# Patient Record
Sex: Female | Born: 1935
Health system: Southern US, Community
[De-identification: ages and names within clinical notes are randomized; demographics above are authoritative.]

## PROBLEM LIST (undated history)

## (undated) DIAGNOSIS — H04123 Dry eye syndrome of bilateral lacrimal glands: Secondary | ICD-10-CM

## (undated) DIAGNOSIS — Z9289 Personal history of other medical treatment: Secondary | ICD-10-CM

## (undated) DIAGNOSIS — K5900 Constipation, unspecified: Secondary | ICD-10-CM

## (undated) DIAGNOSIS — I10 Essential (primary) hypertension: Secondary | ICD-10-CM

## (undated) DIAGNOSIS — E785 Hyperlipidemia, unspecified: Secondary | ICD-10-CM

## (undated) DIAGNOSIS — M6289 Other specified disorders of muscle: Secondary | ICD-10-CM

## (undated) DIAGNOSIS — E039 Hypothyroidism, unspecified: Secondary | ICD-10-CM

## (undated) DIAGNOSIS — K1120 Sialoadenitis, unspecified: Secondary | ICD-10-CM

## (undated) DIAGNOSIS — R5383 Other fatigue: Secondary | ICD-10-CM

## (undated) DIAGNOSIS — K449 Diaphragmatic hernia without obstruction or gangrene: Secondary | ICD-10-CM

## (undated) DIAGNOSIS — E559 Vitamin D deficiency, unspecified: Secondary | ICD-10-CM

## (undated) DIAGNOSIS — M549 Dorsalgia, unspecified: Secondary | ICD-10-CM

## (undated) DIAGNOSIS — C50919 Malignant neoplasm of unspecified site of unspecified female breast: Secondary | ICD-10-CM

## (undated) DIAGNOSIS — H409 Unspecified glaucoma: Secondary | ICD-10-CM

## (undated) DIAGNOSIS — R0981 Nasal congestion: Secondary | ICD-10-CM

## (undated) DIAGNOSIS — E669 Obesity, unspecified: Secondary | ICD-10-CM

## (undated) DIAGNOSIS — K219 Gastro-esophageal reflux disease without esophagitis: Secondary | ICD-10-CM

## (undated) DIAGNOSIS — E119 Type 2 diabetes mellitus without complications: Secondary | ICD-10-CM

## (undated) DIAGNOSIS — M1990 Unspecified osteoarthritis, unspecified site: Secondary | ICD-10-CM

## (undated) DIAGNOSIS — M2550 Pain in unspecified joint: Secondary | ICD-10-CM

## (undated) DIAGNOSIS — K5792 Diverticulitis of intestine, part unspecified, without perforation or abscess without bleeding: Secondary | ICD-10-CM

## (undated) DIAGNOSIS — R7303 Prediabetes: Secondary | ICD-10-CM

## (undated) DIAGNOSIS — H43399 Other vitreous opacities, unspecified eye: Secondary | ICD-10-CM

## (undated) DIAGNOSIS — H9190 Unspecified hearing loss, unspecified ear: Secondary | ICD-10-CM

## (undated) DIAGNOSIS — G43109 Migraine with aura, not intractable, without status migrainosus: Secondary | ICD-10-CM

## (undated) DIAGNOSIS — M791 Myalgia, unspecified site: Secondary | ICD-10-CM

## (undated) DIAGNOSIS — J310 Chronic rhinitis: Secondary | ICD-10-CM

## (undated) DIAGNOSIS — K829 Disease of gallbladder, unspecified: Secondary | ICD-10-CM

## (undated) HISTORY — PX: MASTOIDECTOMY: SHX711

## (undated) HISTORY — DX: Pain in unspecified joint: M25.50

## (undated) HISTORY — DX: Chronic rhinitis: J31.0

## (undated) HISTORY — DX: Diverticulitis of intestine, part unspecified, without perforation or abscess without bleeding: K57.92

## (undated) HISTORY — DX: Unspecified hearing loss, unspecified ear: H91.90

## (undated) HISTORY — DX: Disease of gallbladder, unspecified: K82.9

## (undated) HISTORY — DX: Unspecified osteoarthritis, unspecified site: M19.90

## (undated) HISTORY — DX: Gastro-esophageal reflux disease without esophagitis: K21.9

## (undated) HISTORY — PX: APPENDECTOMY: SHX54

## (undated) HISTORY — DX: Dry eye syndrome of bilateral lacrimal glands: H04.123

## (undated) HISTORY — PX: SHOULDER ARTHROSCOPY: SHX128

## (undated) HISTORY — DX: Unspecified glaucoma: H40.9

## (undated) HISTORY — DX: Malignant neoplasm of unspecified site of unspecified female breast: C50.919

## (undated) HISTORY — DX: Vitamin D deficiency, unspecified: E55.9

## (undated) HISTORY — DX: Other specified disorders of muscle: M62.89

## (undated) HISTORY — PX: TUBAL LIGATION: SHX77

## (undated) HISTORY — DX: Other vitreous opacities, unspecified eye: H43.399

## (undated) HISTORY — DX: Sialoadenitis, unspecified: K11.20

## (undated) HISTORY — DX: Gastro-esophageal reflux disease without esophagitis: K44.9

## (undated) HISTORY — DX: Dorsalgia, unspecified: M54.9

## (undated) HISTORY — DX: Other fatigue: R53.83

## (undated) HISTORY — DX: Obesity, unspecified: E66.9

## (undated) HISTORY — DX: Myalgia, unspecified site: M79.10

## (undated) HISTORY — DX: Constipation, unspecified: K59.00

## (undated) HISTORY — DX: Prediabetes: R73.03

## (undated) HISTORY — PX: TONSILLECTOMY: SUR1361

## (undated) HISTORY — DX: Personal history of other medical treatment: Z92.89

## (undated) HISTORY — DX: Nasal congestion: R09.81

## (undated) HISTORY — PX: JOINT REPLACEMENT: SHX530

## (undated) HISTORY — DX: Hyperlipidemia, unspecified: E78.5

## (undated) HISTORY — DX: Type 2 diabetes mellitus without complications: E11.9

---

## 2005-09-14 HISTORY — PX: TOTAL KNEE ARTHROPLASTY: SHX125

## 2006-09-14 HISTORY — PX: OTHER SURGICAL HISTORY: SHX169

## 2008-09-14 HISTORY — PX: CHOLECYSTECTOMY: SHX55

## 2015-02-05 ENCOUNTER — Ambulatory Visit: Payer: Self-pay | Admitting: Family Medicine

## 2015-02-26 ENCOUNTER — Other Ambulatory Visit: Payer: Self-pay | Admitting: Orthopedic Surgery

## 2015-03-09 NOTE — Pre-Procedure Instructions (Signed)
Catherine Coleman  03/09/2015     Your procedure is scheduled on July 8.  Report to [facility] Admitting at 8:50 A.M.  Call this number if you have problems the morning of surgery:  [phone]   Remember:  Do not eat food or drink liquids after midnight.  Take these medicines the morning of surgery with A SIP OF WATER Eye drops, Levothyroxine, Omeprazole,    STOP Multiple Vitamin July 1   STOP/ Do not take Aspirin, Aleve, Naproxen, Advil, Ibuprofen, Motrin, Vitamins, Herbs, or Supplements starting July 1   Do not wear jewelry, make-up or nail polish.  Do not wear lotions, powders, or perfumes.  You may wear deodorant.  Do not shave 48 hours prior to surgery.  Men may shave face and neck.  Do not bring valuables to the hospital.  [facility] is not responsible for any belongings or valuables.  Contacts, dentures or bridgework may not be worn into surgery.  Leave your suitcase in the car.  After surgery it may be brought to your room.  For patients admitted to the hospital, discharge time will be determined by your treatment team.  [facility] - Preparing for Surgery  Before surgery, you can play an important role.  Because skin is not sterile, your skin needs to be as free of germs as possible.  You can reduce the number of germs on you skin by washing with CHG (chlorahexidine gluconate) soap before surgery.  CHG is an antiseptic cleaner which kills germs and bonds with the skin to continue killing germs even after washing.  Please DO NOT use if you have an allergy to CHG or antibacterial soaps.  If your skin becomes reddened/irritated stop using the CHG and inform your nurse when you arrive at Short Stay.  Do not shave (including legs and underarms) for at least 48 hours prior to the first CHG shower.  You may shave your face.  Please follow these instructions carefully:   1.  Shower with CHG Soap the night before surgery and the morning of Surgery.  2.  If you  choose to wash your hair, wash your hair first as usual with your normal shampoo.  3.  After you shampoo, rinse your hair and body thoroughly to remove the shampoo.  4.  Use CHG as you would any other liquid soap.  You can apply CHG directly to the skin and wash gently with scrungie or a clean washcloth.  5.  Apply the CHG Soap to your body ONLY FROM THE NECK DOWN.  Do not use on open wounds or open sores.  Avoid contact with your eyes, ears, mouth and genitals (private parts).  Wash genitals (private parts) with your normal soap.  6.  Wash thoroughly, paying special attention to the area where your surgery will be performed.  7.  Thoroughly rinse your body with warm water from the neck down.  8.  DO NOT shower/wash with your normal soap after using and rinsing off the CHG Soap.  9.  Pat yourself dry with a clean towel.            10.  Wear clean pajamas.            11.  Place clean sheets on your bed the night of your first shower and do not sleep with pets.  Day of Surgery  Do not apply any lotions the morning of surgery.  Please wear clean clothes to the hospital/surgery center.  Please read over the following fact sheets that you were given. Pain Booklet, Coughing and Deep Breathing, Blood Transfusion Information, Total Joint Packet and Surgical Site Infection Prevention

## 2015-03-11 ENCOUNTER — Encounter (HOSPITAL_COMMUNITY)
Admission: RE | Admit: 2015-03-11 | Discharge: 2015-03-11 | Disposition: A | Discharge status: Home / Self Care | Payer: Medicare Other | Source: Ambulatory Visit | Attending: Orthopedic Surgery | Admitting: Orthopedic Surgery

## 2015-03-11 ENCOUNTER — Encounter (HOSPITAL_COMMUNITY): Payer: Self-pay

## 2015-03-11 DIAGNOSIS — R001 Bradycardia, unspecified: Secondary | ICD-10-CM | POA: Diagnosis not present

## 2015-03-11 DIAGNOSIS — Z96612 Presence of left artificial shoulder joint: Secondary | ICD-10-CM | POA: Diagnosis not present

## 2015-03-11 DIAGNOSIS — Z79899 Other long term (current) drug therapy: Secondary | ICD-10-CM | POA: Diagnosis not present

## 2015-03-11 DIAGNOSIS — K219 Gastro-esophageal reflux disease without esophagitis: Secondary | ICD-10-CM | POA: Insufficient documentation

## 2015-03-11 DIAGNOSIS — Z01812 Encounter for preprocedural laboratory examination: Secondary | ICD-10-CM | POA: Insufficient documentation

## 2015-03-11 DIAGNOSIS — I10 Essential (primary) hypertension: Secondary | ICD-10-CM | POA: Diagnosis not present

## 2015-03-11 DIAGNOSIS — I517 Cardiomegaly: Secondary | ICD-10-CM | POA: Insufficient documentation

## 2015-03-11 DIAGNOSIS — Z96611 Presence of right artificial shoulder joint: Secondary | ICD-10-CM | POA: Insufficient documentation

## 2015-03-11 DIAGNOSIS — E039 Hypothyroidism, unspecified: Secondary | ICD-10-CM | POA: Diagnosis not present

## 2015-03-11 DIAGNOSIS — Z01818 Encounter for other preprocedural examination: Secondary | ICD-10-CM | POA: Diagnosis not present

## 2015-03-11 DIAGNOSIS — I7090 Unspecified atherosclerosis: Secondary | ICD-10-CM | POA: Diagnosis not present

## 2015-03-11 DIAGNOSIS — M479 Spondylosis, unspecified: Secondary | ICD-10-CM | POA: Insufficient documentation

## 2015-03-11 DIAGNOSIS — I4510 Unspecified right bundle-branch block: Secondary | ICD-10-CM | POA: Insufficient documentation

## 2015-03-11 DIAGNOSIS — Z0183 Encounter for blood typing: Secondary | ICD-10-CM | POA: Diagnosis not present

## 2015-03-11 DIAGNOSIS — M1611 Unilateral primary osteoarthritis, right hip: Secondary | ICD-10-CM | POA: Diagnosis not present

## 2015-03-11 HISTORY — DX: Hypothyroidism, unspecified: E03.9

## 2015-03-11 HISTORY — DX: Essential (primary) hypertension: I10

## 2015-03-11 HISTORY — DX: Gastro-esophageal reflux disease without esophagitis: K21.9

## 2015-03-11 HISTORY — DX: Unspecified osteoarthritis, unspecified site: M19.90

## 2015-03-11 LAB — COMPREHENSIVE METABOLIC PANEL
ALBUMIN: 3.9 g/dL (ref 3.5–5.0)
ALK PHOS: 82 U/L (ref 38–126)
ALT: 17 U/L (ref 14–54)
AST: 23 U/L (ref 15–41)
Anion gap: 8 (ref 5–15)
BUN: 11 mg/dL (ref 6–20)
CALCIUM: 8.7 mg/dL — AB (ref 8.9–10.3)
CO2: 27 mmol/L (ref 22–32)
Chloride: 98 mmol/L — ABNORMAL LOW (ref 101–111)
Creatinine, Ser: 0.69 mg/dL (ref 0.44–1.00)
GFR calc Af Amer: >60 mL/min (ref >60)
GFR calc non Af Amer: >60 mL/min (ref >60)
Glucose, Bld: 97 mg/dL (ref 65–99)
POTASSIUM: 4 mmol/L (ref 3.5–5.1)
Sodium: 133 mmol/L — ABNORMAL LOW (ref 135–145)
TOTAL PROTEIN: 6.4 g/dL — AB (ref 6.5–8.1)
Total Bilirubin: 0.3 mg/dL (ref 0.3–1.2)

## 2015-03-11 LAB — URINALYSIS, ROUTINE W REFLEX MICROSCOPIC
Bilirubin Urine: NEGATIVE
GLUCOSE, UA: NEGATIVE mg/dL
Hgb urine dipstick: NEGATIVE
Ketones, ur: NEGATIVE mg/dL
Leukocytes, UA: NEGATIVE
Nitrite: NEGATIVE
PROTEIN: NEGATIVE mg/dL
Specific Gravity, Urine: 1.007 (ref 1.005–1.030)
Urobilinogen, UA: 0.2 mg/dL (ref 0.0–1.0)
pH: 5.5 (ref 5.0–8.0)

## 2015-03-11 LAB — CBC WITH DIFFERENTIAL/PLATELET
BASOS PCT: 1 % (ref 0–1)
Basophils Absolute: 0 10*3/uL (ref 0.0–0.1)
EOS ABS: 0 10*3/uL (ref 0.0–0.7)
EOS PCT: 1 % (ref 0–5)
HEMATOCRIT: 39.7 % (ref 36.0–46.0)
Hemoglobin: 13.5 g/dL (ref 12.0–15.0)
LYMPHS ABS: 1.8 10*3/uL (ref 0.7–4.0)
LYMPHS PCT: 32 % (ref 12–46)
MCH: 31.9 pg (ref 26.0–34.0)
MCHC: 34 g/dL (ref 30.0–36.0)
MCV: 93.9 fL (ref 78.0–100.0)
Monocytes Absolute: 0.5 10*3/uL (ref 0.1–1.0)
Monocytes Relative: 8 % (ref 3–12)
NEUTROS ABS: 3.3 10*3/uL (ref 1.7–7.7)
Neutrophils Relative %: 58 % (ref 43–77)
Platelets: 213 10*3/uL (ref 150–400)
RBC: 4.23 MIL/uL (ref 3.87–5.11)
RDW: 13 % (ref 11.5–15.5)
WBC: 5.7 10*3/uL (ref 4.0–10.5)

## 2015-03-11 LAB — PROTIME-INR
INR: 0.98 (ref 0.00–1.49)
Prothrombin Time: 13.2 seconds (ref 11.6–15.2)

## 2015-03-11 LAB — TYPE AND SCREEN
ABO/RH(D): O POS
ANTIBODY SCREEN: NEGATIVE

## 2015-03-11 LAB — SURGICAL PCR SCREEN
MRSA, PCR: NEGATIVE
STAPHYLOCOCCUS AUREUS: NEGATIVE

## 2015-03-11 LAB — ABO/RH: ABO/RH(D): O POS

## 2015-03-11 LAB — APTT: aPTT: 30 seconds (ref 24–37)

## 2015-03-11 NOTE — Progress Notes (Addendum)
PCP: [facility] [place] in [place], [place].  Denies any cardiac testing. Refused to watch video. Gave emmi  Handout.  Requested ekg for comparison -[facility] [place] [facility] [phone]

## 2015-03-12 NOTE — Progress Notes (Signed)
Anesthesia Chart Review:  Pt is 81 year old female scheduled for R hip total arthroplasty on 03/22/2015 with Dr. Berenice Primas.   PMH includes: HTN, hypothyroidism, GERD. Never smoker. BMI 33.  Medications include: levothyroxine, olmesartan, prilosec.   Preoperative labs reviewed.    Chest x-ray 03/11/2015 reviewed. No acute cardiopulmonary disease.  Cardiomegaly without congestive failure. Atherosclerosis.  EKG 03/11/2015: Sinus bradycardia (58 bpm). RBBB.   If no changes, I anticipate pt can proceed with surgery as scheduled.   Willeen Cass, FNP-BC [facility]/Anesthesiology Phone: [phone] 03/12/2015 3:44 PM

## 2015-03-22 ENCOUNTER — Inpatient Hospital Stay (HOSPITAL_COMMUNITY): Payer: Medicare Other | Admitting: Anesthesiology

## 2015-03-22 ENCOUNTER — Encounter (HOSPITAL_COMMUNITY): Payer: Self-pay | Admitting: Anesthesiology

## 2015-03-22 ENCOUNTER — Encounter (HOSPITAL_COMMUNITY)
Admission: RE | Disposition: A | Discharge status: Skilled Nursing Facility | Payer: Self-pay | Source: Ambulatory Visit | Attending: Orthopedic Surgery

## 2015-03-22 ENCOUNTER — Inpatient Hospital Stay (HOSPITAL_COMMUNITY)
Admission: RE | Admit: 2015-03-22 | Discharge: 2015-03-24 | DRG: 470 | Disposition: A | Discharge status: Skilled Nursing Facility | Payer: Medicare Other | Source: Ambulatory Visit | Attending: Orthopedic Surgery | Admitting: Orthopedic Surgery

## 2015-03-22 ENCOUNTER — Inpatient Hospital Stay (HOSPITAL_COMMUNITY): Payer: Medicare Other

## 2015-03-22 ENCOUNTER — Inpatient Hospital Stay (HOSPITAL_COMMUNITY): Payer: Medicare Other | Admitting: Emergency Medicine

## 2015-03-22 DIAGNOSIS — R338 Other retention of urine: Secondary | ICD-10-CM | POA: Diagnosis not present

## 2015-03-22 DIAGNOSIS — M1611 Unilateral primary osteoarthritis, right hip: Secondary | ICD-10-CM | POA: Diagnosis present

## 2015-03-22 DIAGNOSIS — I10 Essential (primary) hypertension: Secondary | ICD-10-CM | POA: Diagnosis present

## 2015-03-22 DIAGNOSIS — M25551 Pain in right hip: Secondary | ICD-10-CM | POA: Diagnosis present

## 2015-03-22 DIAGNOSIS — E039 Hypothyroidism, unspecified: Secondary | ICD-10-CM | POA: Diagnosis present

## 2015-03-22 DIAGNOSIS — Z419 Encounter for procedure for purposes other than remedying health state, unspecified: Secondary | ICD-10-CM

## 2015-03-22 DIAGNOSIS — K219 Gastro-esophageal reflux disease without esophagitis: Secondary | ICD-10-CM | POA: Diagnosis present

## 2015-03-22 HISTORY — PX: TOTAL HIP ARTHROPLASTY: SHX124

## 2015-03-22 SURGERY — ARTHROPLASTY, HIP, TOTAL, ANTERIOR APPROACH
Anesthesia: Regional | Site: Hip | Laterality: Right

## 2015-03-22 MED ORDER — OXYCODONE-ACETAMINOPHEN 5-325 MG PO TABS: 1.0000 | ORAL_TABLET | Freq: Four times a day (QID) | ORAL | Status: DC | PRN

## 2015-03-22 MED ORDER — LIDOCAINE HCL (CARDIAC) 20 MG/ML IV SOLN
INTRAVENOUS | Status: DC | PRN
  Administered 2015-03-22: 100 mg via INTRAVENOUS

## 2015-03-22 MED ORDER — BISACODYL 5 MG PO TBEC: 5.0000 mg | DELAYED_RELEASE_TABLET | Freq: Every day | ORAL | Status: DC | PRN

## 2015-03-22 MED ORDER — METHOCARBAMOL 500 MG PO TABS: 500.0000 mg | ORAL_TABLET | Freq: Three times a day (TID) | ORAL | Status: DC | PRN

## 2015-03-22 MED ORDER — CEFAZOLIN SODIUM-DEXTROSE 2-3 GM-% IV SOLR
2.0000 g | Freq: Four times a day (QID) | INTRAVENOUS | Status: AC
  Administered 2015-03-22 (×2): 2 g via INTRAVENOUS
  Filled 2015-03-22 (×2): qty 50

## 2015-03-22 MED ORDER — PROPOFOL 10 MG/ML IV BOLUS
INTRAVENOUS | Status: AC
  Filled 2015-03-22: qty 20

## 2015-03-22 MED ORDER — SODIUM CHLORIDE 0.9 % IV SOLN
INTRAVENOUS | Status: DC
  Administered 2015-03-22: 16:00:00 via INTRAVENOUS

## 2015-03-22 MED ORDER — OXYCODONE-ACETAMINOPHEN 5-325 MG PO TABS
1.0000 | ORAL_TABLET | ORAL | Status: DC | PRN
  Administered 2015-03-22: 1 via ORAL
  Administered 2015-03-23: 2 via ORAL
  Administered 2015-03-23: 1 via ORAL
  Administered 2015-03-23 – 2015-03-24 (×2): 2 via ORAL
  Filled 2015-03-22: qty 2
  Filled 2015-03-22 (×2): qty 1
  Filled 2015-03-22: qty 2
  Filled 2015-03-22 (×2): qty 1

## 2015-03-22 MED ORDER — POLYETHYLENE GLYCOL 3350 17 G PO PACK
17.0000 g | PACK | Freq: Every day | ORAL | Status: DC | PRN
  Administered 2015-03-24: 17 g via ORAL
  Filled 2015-03-22: qty 1

## 2015-03-22 MED ORDER — FENTANYL CITRATE (PF) 100 MCG/2ML IJ SOLN
100.0000 ug | Freq: Once | INTRAMUSCULAR | Status: AC
  Administered 2015-03-22: 100 ug via INTRAVENOUS

## 2015-03-22 MED ORDER — LEVOTHYROXINE SODIUM 100 MCG PO TABS
100.0000 ug | ORAL_TABLET | Freq: Every day | ORAL | Status: DC
  Administered 2015-03-23 – 2015-03-24 (×2): 100 ug via ORAL
  Filled 2015-03-22 (×2): qty 1

## 2015-03-22 MED ORDER — ONDANSETRON HCL 4 MG/2ML IJ SOLN
INTRAMUSCULAR | Status: DC | PRN
  Administered 2015-03-22: 4 mg via INTRAVENOUS

## 2015-03-22 MED ORDER — FENTANYL CITRATE (PF) 100 MCG/2ML IJ SOLN
INTRAMUSCULAR | Status: DC | PRN
  Administered 2015-03-22 (×5): 50 ug via INTRAVENOUS

## 2015-03-22 MED ORDER — ASPIRIN EC 325 MG PO TBEC
325.0000 mg | DELAYED_RELEASE_TABLET | Freq: Two times a day (BID) | ORAL | Status: DC
  Administered 2015-03-22 – 2015-03-24 (×4): 325 mg via ORAL
  Filled 2015-03-22 (×4): qty 1

## 2015-03-22 MED ORDER — METHOCARBAMOL 500 MG PO TABS
500.0000 mg | ORAL_TABLET | Freq: Four times a day (QID) | ORAL | Status: DC | PRN
  Filled 2015-03-22 (×2): qty 1

## 2015-03-22 MED ORDER — 0.9 % SODIUM CHLORIDE (POUR BTL) OPTIME
TOPICAL | Status: DC | PRN
  Administered 2015-03-22: 1000 mL

## 2015-03-22 MED ORDER — PROMETHAZINE HCL 25 MG/ML IJ SOLN: 6.2500 mg | Freq: Four times a day (QID) | INTRAMUSCULAR | Status: DC | PRN

## 2015-03-22 MED ORDER — ARTIFICIAL TEARS OP OINT
TOPICAL_OINTMENT | OPHTHALMIC | Status: DC | PRN
  Administered 2015-03-22: 1 via OPHTHALMIC

## 2015-03-22 MED ORDER — DOCUSATE SODIUM 100 MG PO CAPS
100.0000 mg | ORAL_CAPSULE | Freq: Two times a day (BID) | ORAL | Status: DC
  Administered 2015-03-22 – 2015-03-24 (×5): 100 mg via ORAL
  Filled 2015-03-22 (×5): qty 1

## 2015-03-22 MED ORDER — HYDROMORPHONE HCL 1 MG/ML IJ SOLN: 0.5000 mg | INTRAMUSCULAR | Status: DC | PRN

## 2015-03-22 MED ORDER — ACETAMINOPHEN 650 MG RE SUPP: 650.0000 mg | Freq: Four times a day (QID) | RECTAL | Status: DC | PRN

## 2015-03-22 MED ORDER — ROCURONIUM BROMIDE 100 MG/10ML IV SOLN
INTRAVENOUS | Status: DC | PRN
  Administered 2015-03-22: 30 mg via INTRAVENOUS

## 2015-03-22 MED ORDER — PHENYLEPHRINE HCL 10 MG/ML IJ SOLN
INTRAMUSCULAR | Status: DC | PRN
  Administered 2015-03-22: 80 ug via INTRAVENOUS

## 2015-03-22 MED ORDER — BUPIVACAINE LIPOSOME 1.3 % IJ SUSP
20.0000 mL | INTRAMUSCULAR | Status: DC
  Filled 2015-03-22: qty 20

## 2015-03-22 MED ORDER — DEXAMETHASONE SODIUM PHOSPHATE 10 MG/ML IJ SOLN
10.0000 mg | Freq: Two times a day (BID) | INTRAMUSCULAR | Status: AC
  Administered 2015-03-22 (×2): 10 mg via INTRAVENOUS
  Filled 2015-03-22 (×2): qty 1

## 2015-03-22 MED ORDER — PROPOFOL 10 MG/ML IV BOLUS
INTRAVENOUS | Status: DC | PRN
  Administered 2015-03-22: 30 mg via INTRAVENOUS
  Administered 2015-03-22: 170 mg via INTRAVENOUS

## 2015-03-22 MED ORDER — DIPHENHYDRAMINE HCL 12.5 MG/5ML PO ELIX
12.5000 mg | ORAL_SOLUTION | ORAL | Status: DC | PRN
  Administered 2015-03-22: 12.5 mg via ORAL
  Filled 2015-03-22: qty 10

## 2015-03-22 MED ORDER — BRIMONIDINE TARTRATE 0.2 % OP SOLN
1.0000 [drp] | Freq: Two times a day (BID) | OPHTHALMIC | Status: DC
  Administered 2015-03-22 – 2015-03-24 (×2): 1 [drp] via OPHTHALMIC
  Filled 2015-03-22 (×2): qty 5

## 2015-03-22 MED ORDER — CEFAZOLIN SODIUM-DEXTROSE 2-3 GM-% IV SOLR
2.0000 g | INTRAVENOUS | Status: AC
  Administered 2015-03-22: 2 g via INTRAVENOUS
  Filled 2015-03-22: qty 50

## 2015-03-22 MED ORDER — TRANEXAMIC ACID 1000 MG/10ML IV SOLN
1000.0000 mg | INTRAVENOUS | Status: AC
  Administered 2015-03-22: 1000 mg via INTRAVENOUS
  Filled 2015-03-22: qty 10

## 2015-03-22 MED ORDER — HYDROMORPHONE HCL 1 MG/ML IJ SOLN
INTRAMUSCULAR | Status: AC
  Administered 2015-03-22: 0.5 mg via INTRAVENOUS
  Filled 2015-03-22: qty 1

## 2015-03-22 MED ORDER — BUPIVACAINE LIPOSOME 1.3 % IJ SUSP
INTRAMUSCULAR | Status: DC | PRN
  Administered 2015-03-22: 20 mL

## 2015-03-22 MED ORDER — NEOSTIGMINE METHYLSULFATE 10 MG/10ML IV SOLN
INTRAVENOUS | Status: DC | PRN
  Administered 2015-03-22: 3 mg via INTRAVENOUS

## 2015-03-22 MED ORDER — ONDANSETRON HCL 4 MG/2ML IJ SOLN: 4.0000 mg | Freq: Once | INTRAMUSCULAR | Status: DC | PRN

## 2015-03-22 MED ORDER — FENTANYL CITRATE (PF) 250 MCG/5ML IJ SOLN
INTRAMUSCULAR | Status: AC
  Filled 2015-03-22: qty 5

## 2015-03-22 MED ORDER — LACTATED RINGERS IV SOLN
INTRAVENOUS | Status: DC
  Administered 2015-03-22 (×2): via INTRAVENOUS

## 2015-03-22 MED ORDER — ONDANSETRON HCL 4 MG PO TABS: 4.0000 mg | ORAL_TABLET | Freq: Four times a day (QID) | ORAL | Status: DC | PRN

## 2015-03-22 MED ORDER — PANTOPRAZOLE SODIUM 40 MG PO TBEC
40.0000 mg | DELAYED_RELEASE_TABLET | Freq: Every day | ORAL | Status: DC
  Administered 2015-03-23 – 2015-03-24 (×2): 40 mg via ORAL
  Filled 2015-03-22 (×2): qty 1

## 2015-03-22 MED ORDER — METHOCARBAMOL 1000 MG/10ML IJ SOLN
500.0000 mg | Freq: Four times a day (QID) | INTRAVENOUS | Status: DC | PRN
  Administered 2015-03-22: 500 mg via INTRAVENOUS
  Filled 2015-03-22 (×2): qty 5

## 2015-03-22 MED ORDER — ONDANSETRON HCL 4 MG/2ML IJ SOLN
4.0000 mg | Freq: Four times a day (QID) | INTRAMUSCULAR | Status: DC | PRN
  Filled 2015-03-22: qty 2

## 2015-03-22 MED ORDER — ACETAMINOPHEN 325 MG PO TABS
650.0000 mg | ORAL_TABLET | Freq: Four times a day (QID) | ORAL | Status: DC | PRN
  Administered 2015-03-23 – 2015-03-24 (×3): 650 mg via ORAL
  Filled 2015-03-22 (×3): qty 2

## 2015-03-22 MED ORDER — GLYCOPYRROLATE 0.2 MG/ML IJ SOLN
INTRAMUSCULAR | Status: DC | PRN
  Administered 2015-03-22: 0.2 mg via INTRAVENOUS
  Administered 2015-03-22: 0.4 mg via INTRAVENOUS
  Administered 2015-03-22: 0.2 mg via INTRAVENOUS

## 2015-03-22 MED ORDER — CHLORHEXIDINE GLUCONATE 4 % EX LIQD: 60.0000 mL | Freq: Once | CUTANEOUS | Status: DC

## 2015-03-22 MED ORDER — TRANEXAMIC ACID 1000 MG/10ML IV SOLN: 1000.0000 mg | INTRAVENOUS | Status: DC

## 2015-03-22 MED ORDER — EPHEDRINE SULFATE 50 MG/ML IJ SOLN
INTRAMUSCULAR | Status: DC | PRN
  Administered 2015-03-22 (×2): 10 mg via INTRAVENOUS

## 2015-03-22 MED ORDER — HYDROMORPHONE HCL 1 MG/ML IJ SOLN
0.5000 mg | INTRAMUSCULAR | Status: AC | PRN
  Administered 2015-03-22 (×4): 0.5 mg via INTRAVENOUS

## 2015-03-22 MED ORDER — FENTANYL CITRATE (PF) 100 MCG/2ML IJ SOLN
INTRAMUSCULAR | Status: AC
  Filled 2015-03-22: qty 2

## 2015-03-22 MED ORDER — BUPIVACAINE HCL 0.5 % IJ SOLN
INTRAMUSCULAR | Status: DC | PRN
  Administered 2015-03-22: 20 mL

## 2015-03-22 MED ORDER — FLEET ENEMA 7-19 GM/118ML RE ENEM: 1.0000 | ENEMA | Freq: Once | RECTAL | Status: AC | PRN

## 2015-03-22 MED ORDER — ZOLPIDEM TARTRATE 5 MG PO TABS: 5.0000 mg | ORAL_TABLET | Freq: Every evening | ORAL | Status: DC | PRN

## 2015-03-22 MED ORDER — ASPIRIN EC 325 MG PO TBEC: 325.0000 mg | DELAYED_RELEASE_TABLET | Freq: Two times a day (BID) | ORAL | Status: DC

## 2015-03-22 MED ORDER — IRBESARTAN 150 MG PO TABS
150.0000 mg | ORAL_TABLET | Freq: Every day | ORAL | Status: DC
  Administered 2015-03-22 – 2015-03-24 (×3): 150 mg via ORAL
  Filled 2015-03-22 (×3): qty 1

## 2015-03-22 MED ORDER — ALUM & MAG HYDROXIDE-SIMETH 200-200-20 MG/5ML PO SUSP: 30.0000 mL | ORAL | Status: DC | PRN

## 2015-03-22 MED ORDER — KETOROLAC TROMETHAMINE 15 MG/ML IJ SOLN
7.5000 mg | Freq: Three times a day (TID) | INTRAMUSCULAR | Status: AC
  Administered 2015-03-22 – 2015-03-23 (×3): 7.5 mg via INTRAVENOUS
  Filled 2015-03-22 (×3): qty 1

## 2015-03-22 SURGICAL SUPPLY — 56 items
BENZOIN TINCTURE PRP APPL 2/3 (GAUZE/BANDAGES/DRESSINGS) ×3 IMPLANT
BLADE SAW SGTL 18X1.27X75 (BLADE) ×2 IMPLANT
BLADE SAW SGTL 18X1.27X75MM (BLADE) ×1
BLADE SURG ROTATE 9660 (MISCELLANEOUS) IMPLANT
BNDG COHESIVE 6X5 TAN STRL LF (GAUZE/BANDAGES/DRESSINGS) IMPLANT
BNDG GAUZE ELAST 4 BULKY (GAUZE/BANDAGES/DRESSINGS) IMPLANT
CAPT HIP TOTAL 2 ×3 IMPLANT
CELLS DAT CNTRL 66122 CELL SVR (MISCELLANEOUS) ×1 IMPLANT
CLOSURE STERI-STRIP 1/2X4 (GAUZE/BANDAGES/DRESSINGS) ×1
CLSR STERI-STRIP ANTIMIC 1/2X4 (GAUZE/BANDAGES/DRESSINGS) ×2 IMPLANT
COVER SURGICAL LIGHT HANDLE (MISCELLANEOUS) ×3 IMPLANT
DRAPE C-ARM 42X72 X-RAY (DRAPES) ×3 IMPLANT
DRAPE IMP U-DRAPE 54X76 (DRAPES) ×3 IMPLANT
DRAPE STERI IOBAN 125X83 (DRAPES) ×3 IMPLANT
DRAPE U-SHAPE 47X51 STRL (DRAPES) ×9 IMPLANT
DRSG AQUACEL AG ADV 3.5X10 (GAUZE/BANDAGES/DRESSINGS) ×3 IMPLANT
DRSG MEPILEX BORDER 4X8 (GAUZE/BANDAGES/DRESSINGS) ×3 IMPLANT
DURAPREP 26ML APPLICATOR (WOUND CARE) ×3 IMPLANT
ELECT BLADE 4.0 EZ CLEAN MEGAD (MISCELLANEOUS) ×3
ELECT CAUTERY BLADE 6.4 (BLADE) ×3 IMPLANT
ELECT REM PT RETURN 9FT ADLT (ELECTROSURGICAL) ×3
ELECTRODE BLDE 4.0 EZ CLN MEGD (MISCELLANEOUS) ×1 IMPLANT
ELECTRODE REM PT RTRN 9FT ADLT (ELECTROSURGICAL) ×1 IMPLANT
GAUZE XEROFORM 1X8 LF (GAUZE/BANDAGES/DRESSINGS) ×3 IMPLANT
GLOVE BIOGEL PI IND STRL 8 (GLOVE) ×2 IMPLANT
GLOVE BIOGEL PI INDICATOR 8 (GLOVE) ×4
GLOVE ECLIPSE 7.5 STRL STRAW (GLOVE) ×6 IMPLANT
GOWN STRL REUS W/ TWL LRG LVL3 (GOWN DISPOSABLE) ×2 IMPLANT
GOWN STRL REUS W/ TWL XL LVL3 (GOWN DISPOSABLE) ×2 IMPLANT
GOWN STRL REUS W/TWL LRG LVL3 (GOWN DISPOSABLE) ×4
GOWN STRL REUS W/TWL XL LVL3 (GOWN DISPOSABLE) ×4
HOOD PEEL AWAY FACE SHEILD DIS (HOOD) ×6 IMPLANT
KIT BASIN OR (CUSTOM PROCEDURE TRAY) ×3 IMPLANT
KIT ROOM TURNOVER OR (KITS) ×3 IMPLANT
MANIFOLD NEPTUNE II (INSTRUMENTS) ×3 IMPLANT
NEEDLE SPNL 22GX3.5 QUINCKE BK (NEEDLE) ×3 IMPLANT
NS IRRIG 1000ML POUR BTL (IV SOLUTION) ×3 IMPLANT
PACK TOTAL JOINT (CUSTOM PROCEDURE TRAY) ×3 IMPLANT
PACK UNIVERSAL I (CUSTOM PROCEDURE TRAY) ×3 IMPLANT
PAD ARMBOARD 7.5X6 YLW CONV (MISCELLANEOUS) ×6 IMPLANT
RTRCTR WOUND ALEXIS 18CM MED (MISCELLANEOUS) ×3
SPONGE LAP 18X18 X RAY DECT (DISPOSABLE) IMPLANT
STAPLER VISISTAT 35W (STAPLE) IMPLANT
SUT ETHIBOND NAB CT1 #1 30IN (SUTURE) ×6 IMPLANT
SUT MNCRL AB 3-0 PS2 18 (SUTURE) ×3 IMPLANT
SUT VIC AB 0 CT1 27 (SUTURE) ×2
SUT VIC AB 0 CT1 27XBRD ANBCTR (SUTURE) ×1 IMPLANT
SUT VIC AB 1 CT1 27 (SUTURE) ×4
SUT VIC AB 1 CT1 27XBRD ANBCTR (SUTURE) ×2 IMPLANT
SUT VIC AB 2-0 CT1 27 (SUTURE) ×2
SUT VIC AB 2-0 CT1 TAPERPNT 27 (SUTURE) ×1 IMPLANT
SYR 50ML LL SCALE MARK (SYRINGE) ×3 IMPLANT
TOWEL OR 17X24 6PK STRL BLUE (TOWEL DISPOSABLE) ×3 IMPLANT
TOWEL OR 17X26 10 PK STRL BLUE (TOWEL DISPOSABLE) ×3 IMPLANT
TRAY FOLEY CATH 16FR SILVER (SET/KITS/TRAYS/PACK) IMPLANT
WATER STERILE IRR 1000ML POUR (IV SOLUTION) ×6 IMPLANT

## 2015-03-22 NOTE — Anesthesia Preprocedure Evaluation (Addendum)
Anesthesia Evaluation  Patient identified by MRN, date of birth, ID band Patient awake    Reviewed: Allergy & Precautions, H&P , NPO status , Patient's Chart, lab work & pertinent test results  Airway Mallampati: I       Dental  (+) Teeth Intact   Pulmonary    Pulmonary exam normal       Cardiovascular hypertension, Normal cardiovascular exam    Neuro/Psych    GI/Hepatic GERD-  ,  Endo/Other    Renal/GU      Musculoskeletal   Abdominal   Peds  Hematology   Anesthesia Other Findings   Reproductive/Obstetrics                            Anesthesia Physical Anesthesia Plan  ASA: III  Anesthesia Plan: General ETT   Post-op Pain Management: MAC Combined w/ Regional for Post-op pain   Induction: Intravenous  Airway Management Planned: Oral ETT  Additional Equipment:   Intra-op Plan:   Post-operative Plan: Extubation in OR  Informed Consent: I have reviewed the patients History and Physical, chart, labs and discussed the procedure including the risks, benefits and alternatives for the proposed anesthesia with the patient or authorized representative who has indicated his/her understanding and acceptance.   Dental Advisory Given  Plan Discussed with: Anesthesiologist, CRNA and Surgeon  Anesthesia Plan Comments:        Anesthesia Quick Evaluation

## 2015-03-22 NOTE — Anesthesia Procedure Notes (Addendum)
Anesthesia Regional Block:  Fascia iliaca block  Pre-Anesthetic Checklist: ,, timeout performed, Correct Patient, Correct Site, Correct Laterality, Correct Procedure, Correct Position, site marked, Risks and benefits discussed,  Surgical consent,  Pre-op evaluation,  At surgeon's request and post-op pain management  Laterality: Right  Prep: Maximum Sterile Barrier Precautions used, chloraprep and alcohol swabs       Needles:   Needle Type: Stimulator Needle - 80          Additional Needles:  Procedures: Doppler guided Fascia iliaca block Narrative:  Start time: 03/22/2015 10:15 AM End time: 03/22/2015 10:20 AM Injection made incrementally with aspirations every 5 mL.  Performed by: Personally  Anesthesiologist: Kate Sable  Additional Notes: Pt accepts procedure w/ risks. Double prep. 30cc 0.25% Marcaine w/ epi w/ mild difficulty.GES     Procedure Name: Intubation Date/Time: 03/22/2015 10:52 AM Performed by: Scheryl Darter Pre-anesthesia Checklist: Patient identified, Emergency Drugs available, Suction available, Patient being monitored and Timeout performed Patient Re-evaluated:Patient Re-evaluated prior to inductionOxygen Delivery Method: Circle system utilized Preoxygenation: Pre-oxygenation with 100% oxygen Intubation Type: IV induction Ventilation: Mask ventilation without difficulty Laryngoscope Size: Miller and 2 Grade View: Grade I Tube type: Oral Tube size: 7.0 mm Number of attempts: 1 Airway Equipment and Method: Stylet Placement Confirmation: ETT inserted through vocal cords under direct vision,  breath sounds checked- equal and bilateral and positive ETCO2 Secured at: 22 cm Tube secured with: Tape Dental Injury: Teeth and Oropharynx as per pre-operative assessment

## 2015-03-22 NOTE — Brief Op Note (Signed)
03/22/2015  12:48 PM  PATIENT:  Catherine Coleman  81 y.o. female  PRE-OPERATIVE DIAGNOSIS:  endstage degenerative joint disease right hip  POST-OPERATIVE DIAGNOSIS:  endstage degenerative joint disease right hip  PROCEDURE:  Procedure(s): RIGHT TOTAL HIP ARTHROPLASTY ANTERIOR APPROACH (Right)  SURGEON:  Surgeon(s) and Role:    * Dorna Leitz, MD - Primary  PHYSICIAN ASSISTANT:   ASSISTANTS: bethune   ANESTHESIA:   general  EBL:  Total I/O In: 1000 [I.V.:1000] Out: 250 [Blood:250]  BLOOD ADMINISTERED:none  DRAINS: none   LOCAL MEDICATIONS USED:  OTHER experel  SPECIMEN:  No Specimen  DISPOSITION OF SPECIMEN:  N/A  COUNTS:  YES  TOURNIQUET:  * No tourniquets in log *  DICTATION: .Other Dictation: Dictation Number [id number]  PLAN OF CARE: Admit to inpatient   PATIENT DISPOSITION:  PACU - hemodynamically stable.   Delay start of Pharmacological VTE agent (>24hrs) due to surgical blood loss or risk of bleeding: no

## 2015-03-22 NOTE — H&P (Signed)
TOTAL HIP ADMISSION H&P  Patient is admitted for right total hip arthroplasty.  Subjective:  Chief Complaint: right hip pain  HPI: Catherine Coleman, 81 y.o. female, has a history of pain and functional disability in the right hip(s) due to arthritis and patient has failed non-surgical conservative treatments for greater than 12 weeks to include NSAID's and/or analgesics, corticosteriod injections, supervised PT with diminished ADL's post treatment, use of assistive devices, weight reduction as appropriate and activity modification.  Onset of symptoms was gradual starting 8 years ago with gradually worsening course since that time.The patient noted no past surgery on the right hip(s).  Patient currently rates pain in the right hip at 8 out of 10 with activity. Patient has night pain, worsening of pain with activity and weight bearing, trendelenberg gait, pain that interfers with activities of daily living, pain with passive range of motion, crepitus and joint swelling. Patient has evidence of subchondral sclerosis and joint space narrowing by imaging studies. This condition presents safety issues increasing the risk of falls. This patient has had failure of all reasonable conservative care.  There is no current active infection.  There are no active problems to display for this patient.  Past Medical History  Diagnosis Date  . Hypertension   . Hypothyroidism   . GERD (gastroesophageal reflux disease)   . Arthritis     Past Surgical History  Procedure Laterality Date  . Total knee arthroplasty Bilateral 2007  . Shoulder arthroscopy Bilateral prior to 2010  . Joint replacement Left     2012  . Cholecystectomy  2010  . Appendectomy    . Tonsillectomy      Prescriptions prior to admission  Medication Sig Dispense Refill Last Dose  . brimonidine (ALPHAGAN) 0.2 % ophthalmic solution Place 1 drop into both eyes 2 (two) times daily.   03/22/2015 at 0700  . levothyroxine (SYNTHROID, LEVOTHROID) 100  MCG tablet Take 100 mcg by mouth daily before breakfast.   03/22/2015 at 0300  . Multiple Vitamins-Minerals (MULTIVITAMIN WITH MINERALS) tablet Take 1 tablet by mouth daily.   Past Week at Unknown time  . olmesartan (BENICAR) 20 MG tablet Take 20 mg by mouth daily.   03/22/2015 at 0300  . omeprazole (PRILOSEC) 20 MG capsule Take 20 mg by mouth daily.   03/22/2015 at 0300  . Probiotic Product (PROBIOTIC DAILY PO) Take 1 Can by mouth daily.   Past Week at Unknown time   No Known Allergies  History  Substance Use Topics  . Smoking status: Never Smoker   . Smokeless tobacco: Not on file  . Alcohol Use: Yes     Comment: socially    History reviewed. No pertinent family history.   ROS ROS: I have reviewed the patient's review of systems thoroughly and there are no positive responses as relates to the HPI.  Objective:  Physical Exam Well-developed well-nourished patient in no acute distress. Alert and oriented x3 HEENT:within normal limits Cardiac: Regular rate and rhythm Pulmonary: Lungs clear to auscultation Abdomen: Soft and nontender.  Normal active bowel sounds  Musculoskeletal: r hip: painful rom limited int rotation  Vital signs in last 24 hours: Temp:  [97.8 F (36.6 C)] 97.8 F (36.6 C) (07/08 0855) Pulse Rate:  [75] 75 (07/08 0855) Resp:  [20] 20 (07/08 0855) BP: (134)/(72) 134/72 mmHg (07/08 0855) SpO2:  [95 %] 95 % (07/08 0855) Weight:  [197 lb 3.2 oz (89.449 kg)] 197 lb 3.2 oz (89.449 kg) (07/08 0855)  Labs: Recent Results (from  the past 2160 hour(s))  Surgical pcr screen     Status: None   Collection Time: 03/11/15 12:38 PM  Result Value Ref Range   MRSA, PCR NEGATIVE NEGATIVE   Staphylococcus aureus NEGATIVE NEGATIVE    Comment:        The Xpert SA Assay (FDA approved for NASAL specimens in patients over 34 years of age), is one component of a comprehensive surveillance program.  Test performance has been validated by [facility] for patients greater than or  equal to 40 year old. It is not intended to diagnose infection nor to guide or monitor treatment.   APTT     Status: None   Collection Time: 03/11/15 12:39 PM  Result Value Ref Range   aPTT 30 24 - 37 seconds  CBC WITH DIFFERENTIAL     Status: None   Collection Time: 03/11/15 12:39 PM  Result Value Ref Range   WBC 5.7 4.0 - 10.5 K/uL   RBC 4.23 3.87 - 5.11 MIL/uL   Hemoglobin 13.5 12.0 - 15.0 g/dL   HCT 39.7 36.0 - 46.0 %   MCV 93.9 78.0 - 100.0 fL   MCH 31.9 26.0 - 34.0 pg   MCHC 34.0 30.0 - 36.0 g/dL   RDW 13.0 11.5 - 15.5 %   Platelets 213 150 - 400 K/uL   Neutrophils Relative % 58 43 - 77 %   Neutro Abs 3.3 1.7 - 7.7 K/uL   Lymphocytes Relative 32 12 - 46 %   Lymphs Abs 1.8 0.7 - 4.0 K/uL   Monocytes Relative 8 3 - 12 %   Monocytes Absolute 0.5 0.1 - 1.0 K/uL   Eosinophils Relative 1 0 - 5 %   Eosinophils Absolute 0.0 0.0 - 0.7 K/uL   Basophils Relative 1 0 - 1 %   Basophils Absolute 0.0 0.0 - 0.1 K/uL  Comprehensive metabolic panel     Status: Abnormal   Collection Time: 03/11/15 12:39 PM  Result Value Ref Range   Sodium 133 (L) 135 - 145 mmol/L   Potassium 4.0 3.5 - 5.1 mmol/L   Chloride 98 (L) 101 - 111 mmol/L   CO2 27 22 - 32 mmol/L   Glucose, Bld 97 65 - 99 mg/dL   BUN 11 6 - 20 mg/dL   Creatinine, Ser 0.69 0.44 - 1.00 mg/dL   Calcium 8.7 (L) 8.9 - 10.3 mg/dL   Total Protein 6.4 (L) 6.5 - 8.1 g/dL   Albumin 3.9 3.5 - 5.0 g/dL   AST 23 15 - 41 U/L   ALT 17 14 - 54 U/L   Alkaline Phosphatase 82 38 - 126 U/L   Total Bilirubin 0.3 0.3 - 1.2 mg/dL   GFR calc non Af Amer >60 >60 mL/min   GFR calc Af Amer >60 >60 mL/min    Comment: (NOTE) The eGFR has been calculated using the CKD EPI equation. This calculation has not been validated in all clinical situations. eGFR's persistently <60 mL/min signify possible Chronic Kidney Disease.    Anion gap 8 5 - 15  Protime-INR     Status: None   Collection Time: 03/11/15 12:39 PM  Result Value Ref Range   Prothrombin  Time 13.2 11.6 - 15.2 seconds   INR 0.98 0.00 - 1.49  Type and screen     Status: None   Collection Time: 03/11/15 12:39 PM  Result Value Ref Range   ABO/RH(D) O POS    Antibody Screen NEG    Sample Expiration  03/25/2015   ABO/Rh     Status: None   Collection Time: 03/11/15 12:39 PM  Result Value Ref Range   ABO/RH(D) O POS   Urinalysis, Routine w reflex microscopic (not at [facility])     Status: None   Collection Time: 03/11/15  1:19 PM  Result Value Ref Range   Color, Urine YELLOW YELLOW   APPearance CLEAR CLEAR   Specific Gravity, Urine 1.007 1.005 - 1.030   pH 5.5 5.0 - 8.0   Glucose, UA NEGATIVE NEGATIVE mg/dL   Hgb urine dipstick NEGATIVE NEGATIVE   Bilirubin Urine NEGATIVE NEGATIVE   Ketones, ur NEGATIVE NEGATIVE mg/dL   Protein, ur NEGATIVE NEGATIVE mg/dL   Urobilinogen, UA 0.2 0.0 - 1.0 mg/dL   Nitrite NEGATIVE NEGATIVE   Leukocytes, UA NEGATIVE NEGATIVE    Comment: MICROSCOPIC NOT DONE ON URINES WITH NEGATIVE PROTEIN, BLOOD, LEUKOCYTES, NITRITE, OR GLUCOSE <1000 mg/dL.     Estimated body mass index is 32.82 kg/(m^2) as calculated from the following:   Height as of this encounter: 5' 5"  (1.651 m).   Weight as of this encounter: 197 lb 3.2 oz (89.449 kg).   Imaging Review Plain radiographs demonstrate severe degenerative joint disease of the right hip(s). The bone quality appears to be fair for age and reported activity level.  Assessment/Plan:  End stage arthritis, right hip(s)  The patient history, physical examination, clinical judgement of the provider and imaging studies are consistent with end stage degenerative joint disease of the right hip(s) and total hip arthroplasty is deemed medically necessary. The treatment options including medical management, injection therapy, arthroscopy and arthroplasty were discussed at length. The risks and benefits of total hip arthroplasty were presented and reviewed. The risks due to aseptic loosening, infection, stiffness,  dislocation/subluxation,  thromboembolic complications and other imponderables were discussed.  The patient acknowledged the explanation, agreed to proceed with the plan and consent was signed. Patient is being admitted for inpatient treatment for surgery, pain control, PT, OT, prophylactic antibiotics, VTE prophylaxis, progressive ambulation and ADL's and discharge planning.The patient is planning to be discharged to skilled nursing facility

## 2015-03-22 NOTE — Anesthesia Postprocedure Evaluation (Signed)
  Anesthesia Post-op Note  Patient: Catherine Coleman  Procedure(s) Performed: Procedure(s): RIGHT TOTAL HIP ARTHROPLASTY ANTERIOR APPROACH (Right)  Patient Location: PACU  Anesthesia Type:General and GA combined with regional for post-op pain  Level of Consciousness: awake, alert , oriented and patient cooperative  Airway and Oxygen Therapy: Patient Spontanous Breathing  Post-op Pain: mild  Post-op Assessment: Post-op Vital signs reviewed, Patient's Cardiovascular Status Stable, Respiratory Function Stable, Patent Airway, No signs of Nausea or vomiting and Pain level controlled     RLE Motor Response: Purposeful movement, Responds to commands RLE Sensation: Full sensation      Post-op Vital Signs: stable  Last Vitals:  Filed Vitals:   03/22/15 1409  BP:   Pulse:   Temp: 36.4 C  Resp:     Complications: No apparent anesthesia complications

## 2015-03-22 NOTE — Discharge Instructions (Signed)

## 2015-03-22 NOTE — Clinical Social Work Note (Signed)
CSW received referral from MD office RNCM stating patient to admit to [facility] on Sunday 7/10. Three midnight stay NOT required for admission to SNF on Sunday.  Lubertha Sayres, [place] Clinical Social Work Department Orthopedics [phone]) and Surgical [phone])

## 2015-03-22 NOTE — Progress Notes (Signed)
Utilization review completed.  

## 2015-03-22 NOTE — Transfer of Care (Signed)
Immediate Anesthesia Transfer of Care Note  Patient: Catherine Coleman  Procedure(s) Performed: Procedure(s): RIGHT TOTAL HIP ARTHROPLASTY ANTERIOR APPROACH (Right)  Patient Location: PACU  Anesthesia Type:General  Level of Consciousness: awake, oriented, sedated, patient cooperative and responds to stimulation  Airway & Oxygen Therapy: Patient Spontanous Breathing and Patient connected to nasal cannula oxygen  Post-op Assessment: Report given to RN, Post -op Vital signs reviewed and stable, Patient moving all extremities and Patient moving all extremities X 4  Post vital signs: Reviewed and stable  Last Vitals:  Filed Vitals:   03/22/15 1300  BP:   Pulse:   Temp: 36.4 C  Resp:     Complications: No apparent anesthesia complications

## 2015-03-22 NOTE — Evaluation (Signed)
Physical Therapy Evaluation Patient Details Name: Catherine Coleman MRN: [id number] DOB: May 26, 1932 Today's Date: 03/22/2015   History of Present Illness  81 y.o. female s/p right total hip arthroplasty.  Clinical Impression  Pt is s/p THA,presenting with the deficits listed below (see PT Problem List). Limited evaluation due to significant Rt quadriceps weakness post op day #0. Required min assist for bed mobility and tolerated sitting edge of bed x5 minutes with some nausea but no emesis. Unsafe to fully stand or transfer at this time due to minimal quadriceps activation. Tolerated exercises well. Plan for transfer training and hopefully gait training tomorrow. Recommended nursing staff use bed pan this evening rather than attempt transfer to [facility]. Pt will benefit from skilled PT to increase their independence and safety with mobility to allow discharge to the venue listed below.      Follow Up Recommendations SNF    Equipment Recommendations   (TBD next venue of care)    Recommendations for Other Services       Precautions / Restrictions Precautions Precautions: Fall Precaution Comments: No hip precautions (direct anterior) Restrictions Weight Bearing Restrictions: Yes RLE Weight Bearing: Weight bearing as tolerated      Mobility  Bed Mobility Overal bed mobility: Needs Assistance Bed Mobility: Supine to Sit;Sit to Supine     Supine to sit: Supervision;HOB elevated Sit to supine: Min assist   General bed mobility comments: Supervision with VC for technique to approach edge of bed. Required extra time and use of rail with HOB elevated. Min assist for RLE support to return to supine.  Transfers                 General transfer comment: Attempted with use of RW however RLE unable to bear weight due to weak quadriceps on Rt. Not safe to perform transfer at this time.  Ambulation/Gait                Stairs            Wheelchair Mobility    Modified  Rankin (Stroke Patients Only)       Balance Overall balance assessment: Needs assistance Sitting-balance support: No upper extremity supported;Feet supported Sitting balance-Leahy Scale: Good                                       Pertinent Vitals/Pain Pain Assessment: 0-10 Pain Score: 5  Pain Location: Rt hip Pain Descriptors / Indicators: Dull;Throbbing Pain Intervention(s): Monitored during session;Repositioned;Limited activity within patient's tolerance    Home Living Family/patient expects to be discharged to:: Skilled nursing facility Living Arrangements: Alone Available Help at Discharge:  (Lives at an independent living facility) Type of Home: Independent living facility       Home Layout: One level Home Equipment: None      Prior Function Level of Independence: Independent               Hand Dominance   Dominant Hand: Right    Extremity/Trunk Assessment   Upper Extremity Assessment: Defer to OT evaluation           Lower Extremity Assessment: RLE deficits/detail RLE Deficits / Details: Minimal quad activation, 5/5 dorsiflexion with normal light touch sensation around foot however diminished light touch anterior thigh.       Communication   Communication: No difficulties  Cognition Arousal/Alertness: Awake/alert Behavior During Therapy: WFL for tasks assessed/performed Overall Cognitive Status:  Within Functional Limits for tasks assessed                      General Comments General comments (skin integrity, edema, etc.): Numbness of anterior thigh with limited quadriceps activation. Nauseous without emesis.    Exercises Total Joint Exercises Ankle Circles/Pumps: AROM;Both;10 reps;Supine Quad Sets: Strengthening;Both;10 reps;Supine      Assessment/Plan    PT Assessment Patient needs continued PT services  PT Diagnosis Difficulty walking;Acute pain   PT Problem List Decreased strength;Decreased range of  motion;Decreased activity tolerance;Decreased balance;Decreased mobility;Decreased knowledge of use of DME;Impaired sensation;Obesity;Pain  PT Treatment Interventions DME instruction;Gait training;Functional mobility training;Therapeutic activities;Therapeutic exercise;Balance training;Neuromuscular re-education;Patient/family education;Modalities   PT Goals (Current goals can be found in the Care Plan section) Acute Rehab PT Goals Patient Stated Goal: Go to rehab PT Goal Formulation: With patient Time For Goal Achievement: 03/29/15 Potential to Achieve Goals: Good    Frequency 7X/week   Barriers to discharge Decreased caregiver support lives alone at independent living    Co-evaluation               End of Session Equipment Utilized During Treatment: Oxygen Activity Tolerance: Other (comment) (Limited due to Rt quad weakness) Patient left: in bed;with call bell/phone within reach;with bed alarm set Nurse Communication: Mobility status;Other (comment) (Avoid BSC until quad weakness resolves)         Time: 1505-6979 PT Time Calculation (min) (ACUTE ONLY): 28 min   Charges:   PT Evaluation $Initial PT Evaluation Tier I: 1 Procedure PT Treatments $Therapeutic Activity: 8-22 mins   PT G CodesEllouise Newer 03/22/2015, 6:05 PM Camille Bal Helper, [place]

## 2015-03-23 LAB — BASIC METABOLIC PANEL
Anion gap: 8 (ref 5–15)
BUN: 11 mg/dL (ref 6–20)
CO2: 25 mmol/L (ref 22–32)
CREATININE: 0.72 mg/dL (ref 0.44–1.00)
Calcium: 8.7 mg/dL — ABNORMAL LOW (ref 8.9–10.3)
Chloride: 102 mmol/L (ref 101–111)
Glucose, Bld: 173 mg/dL — ABNORMAL HIGH (ref 65–99)
Potassium: 4.4 mmol/L (ref 3.5–5.1)
Sodium: 135 mmol/L (ref 135–145)

## 2015-03-23 LAB — CBC
HEMATOCRIT: 36.8 % (ref 36.0–46.0)
Hemoglobin: 12.2 g/dL (ref 12.0–15.0)
MCH: 31 pg (ref 26.0–34.0)
MCHC: 33.2 g/dL (ref 30.0–36.0)
MCV: 93.4 fL (ref 78.0–100.0)
Platelets: 206 10*3/uL (ref 150–400)
RBC: 3.94 MIL/uL (ref 3.87–5.11)
RDW: 12.6 % (ref 11.5–15.5)
WBC: 9.1 10*3/uL (ref 4.0–10.5)

## 2015-03-23 MED ORDER — WHITE PETROLATUM GEL
Status: AC
  Administered 2015-03-23: 14:00:00
  Filled 2015-03-23: qty 1

## 2015-03-23 NOTE — Clinical Social Work Note (Signed)
Clinical Social Work Assessment  Patient Details  Name: Catherine Coleman MRN: [id number] Date of Birth: [birth year]  Date of referral:  03/23/15               Reason for consult:  Facility Placement                Permission sought to share information with:  Case Manager, Customer service manager, Family Supports Permission granted to share information::  Yes, Verbal Permission Granted  Name::        Agency::  [facility]  Relationship::  Daughter Tour manager Information:     Housing/Transportation Living arrangements for the past 2 months:  Apartment Arts administrator) Source of Information:  Patient, Medical Team Patient Interpreter Needed:  None Criminal Activity/Legal Involvement Pertinent to Current Situation/Hospitalization:  No - Comment as needed Significant Relationships:  Adult Children, Other Family Members Lives with:  Self Do you feel safe going back to the place where you live?  Yes Need for family participation in patient care:  No (Coment)  Care giving concerns:  No concerns at this time. Patient lives at [facility] in a [place] she reports. Loves the facility, but aware she needs SNF due to recent operation.  Agreeable to SNF   Social Worker assessment / plan:  LCSW made aware patient is in need of SNF at DC. LCSW completed assessment with patient who reports she already a resident at [facility].  [facility] aware of patient and agreeable for higher level of care. All clinicals faxed to facility, daughter aware of plan per patient and on phone with her at this time.  Patient very pleasant and cooperative with plan. FL2 updated and all clinicals faxed to facility. Plan is DC on 7/10 as long as patient is stable per MD perspective.  Employment status:  Retired Forensic scientist:  [employer] PT Recommendations:  [place] / Referral to community resources:  [place]  Patient/Family's  Response to care:  Agreeable to plan.  Patient/Family's Understanding of and Emotional Response to Diagnosis, Current Treatment, and Prognosis:  Patient very anxious per report of "feeling shaky", but excited about her prognosis of urinating.  Patient agreeable to plan for SNF and appears to understanding reasons for referral and making progress slowly regaining her independence.  Emotional Assessment Appearance:  Appears stated age Attitude/Demeanor/Rapport:  Other (cooperative, up in chair reading her [place]) Affect (typically observed):  Accepting, Anxious Orientation:  Oriented to Self, Oriented to Place, Oriented to  Time, Oriented to Situation Alcohol / Substance use:  Never Used Psych involvement (Current and /or in the community):  No (Comment)  Discharge Needs  Concerns to be addressed:  Denies Needs/Concerns at this time Readmission within the last 30 days:  No Current discharge risk:  None Barriers to Discharge:  Continued Medical Work up   Lilly Cove, LCSW 03/23/2015, 2:12 PM

## 2015-03-23 NOTE — Op Note (Signed)
NAMELILLIANNE, Catherine Coleman NO.:  [id number]  MEDICAL RECORD NO.:  [id number]  LOCATION:  [id number]                        FACILITY:  [facility]  PHYSICIAN:  Alta Corning, M.D.   DATE OF BIRTH:  [birth year]  DATE OF PROCEDURE:  03/22/2015 DATE OF DISCHARGE:                              OPERATIVE REPORT   PREOPERATIVE DIAGNOSIS:  End-stage degenerative joint disease, right hip.  POSTOPERATIVE DIAGNOSIS:  End-stage degenerative joint disease, right hip.  PROCEDURE:  Right total hip replacement with a Corail size 12 stem high offset with a -1.5 mm 36 mm metal hip ball, and a 52 mm porous-coated cup with a +4 neutral liner.  SURGEON:  Alta Corning, M.D.  ASSISTANT:  Gary Fleet, PA  ANESTHESIA:  General.  BRIEF HISTORY:  Ms. Sitzer is an 81 year old female with a long history of significant complaints of right hip pain.  She had been treated conservatively for prolonged period of time.  After failure of all conservative care, she was taken to the operating room for right total hip replacement.  We have decided because of her activity level that anterior approach may be appropriate and this was chosen to be used preoperatively.  DESCRIPTION OF PROCEDURE:  The patient was brought to the operating room.  After adequate anesthesia was obtained with general anesthetic, the patient was placed supine on the operating table.  She was then moved onto the HANA bed and the right hip was prepped and draped in usual sterile fashion.  Following this, preoperative images had been taken prior to prepping and following prep and drape, an incision was made for an anterior approach to the hip.  Subcutaneous tissue down to the level of the tensor fascia.  Tensor fascia was identified and the fascia was opened and the tensor was retracted laterally.  Retractors were put in place above and below the hip and the hip capsule was then opened and tagged.  Provisional neck cut was made and  the head removed. Retractors were put in place anterior and posterior to the hip. Acetabulum was sequentially reamed to a level of 51 and a 52 mm porous- coated cup was hammered into place in 45 degrees of lateral opening and 20 degrees of anteversion.  Once this was completed, attention was turned towards placement of the hole eliminator and the +4 neutral liner.  Attention was turned to the stem side, which was placed on the HANA bed and externally rotated position, adducted position, and elevated position and we used the cookie cutter followed by the sequential rasping up to a level of 12, 12 was used, initially tried it with a standard neck, shortened the neck some because it was too long, went to the high offset stem and used a minus ball and got it within a couple mm of the opposite side.  At that point, she had excellent stability with 70 of extension and 90 of external rotation.  At that point, the final implant was placed high offset size 12 Corail stem with a -1.5, 36 mm metal hip ball was placed and then reduced and then following this, the wound was irrigated, suctioned dry.  Exparel was then instilled all around  the hip for postoperative anesthesia.  The hip capsule was closed with 1-Vicryl running, the tensor fascia closed with 1-Vicryl running, skin with 0 and 2-0 Vicryl and 3-0 Monocryl subcuticular.  Benzoin and Steri-Strips were applied.  Sterile compressive dressing was applied.  The patient was taken to the recovery room and she was noted to be in satisfactory condition with estimated loss procedure being 300.     Alta Corning, M.D.   ______________________________ Alta Corning, M.D.    Corliss Skains  D:  03/22/2015  T:  03/23/2015  Job:  [id number]  cc:   Alta Corning, M.D.

## 2015-03-23 NOTE — Progress Notes (Signed)
Subjective: R Ant THA 03-22-15 Difficulty voiding overnight with prolonged block.  Pt reports just now beginning to get movement back in leg Had 900 I&O cath last night Tol PO  Pain control reasonable   Objective: Vital signs in last 24 hours: Temp:  [97.5 F (36.4 C)-99.1 F (37.3 C)] 97.8 F (36.6 C) (07/09 0431) Pulse Rate:  [63-94] 63 (07/09 0431) Resp:  [7-19] 16 (07/09 0431) BP: (117-155)/(59-96) 125/68 mmHg (07/09 0431) SpO2:  [94 %-100 %] 99 % (07/09 0431) FiO2 (%):  [28 %] 28 % (07/08 1505)  Intake/Output from previous day: 07/08 0701 - 07/09 0700 In: 2040 [P.O.:240; I.V.:1800] Out: 1225 [Urine:975; Blood:250] Intake/Output this shift: Total I/O In: -  Out: 1400 [Urine:1400]   Recent Labs  03/23/15 0350  HGB 12.2    Recent Labs  03/23/15 0350  WBC 9.1  RBC 3.94  HCT 36.8  PLT 206    Recent Labs  03/23/15 0350  NA 135  K 4.4  CL 102  CO2 25  BUN 11  CREATININE 0.72  GLUCOSE 173*  CALCIUM 8.7*   No results for input(s): LABPT, INR in the last 72 hours.  ABD soft Neurovascular intact Compartment soft dressing intact  Assessment/Plan: Saline lock IVFs Will reinsert foley next time this afternoon if no void and bladder scan sufficient, with attempt to d/c foley in am Continue pathway Poss d/c to [facility] if clears PT and urinary plan in place--No FL-2 on chart yet?   Cheyrl Buley A. 03/23/2015, 11:52 AM

## 2015-03-23 NOTE — Progress Notes (Signed)
Patient stated she was unable to urinate. Bladder scan performed, which showed >999. Received telephone orders for in and out catheterization. A total of 975 mL of collected.

## 2015-03-23 NOTE — Evaluation (Signed)
Occupational Therapy Evaluation Patient Details Name: Catherine Coleman MRN: [id number] DOB: 1932/04/18 Today's Date: 03/23/2015    History of Present Illness 81 y.o. female s/p right total hip arthroplasty.   Clinical Impression   Pt with decline in function and safety with ADLs and ADL mobility with decreased strength, balance and endurance. Pt would benefit from acute OT services to address impairments to increase level of function and safety    Follow Up Recommendations  SNF;Supervision/Assistance - 24 hour    Equipment Recommendations  None recommended by OT (TBD at next venue of care)    Recommendations for Other Services       Precautions / Restrictions Precautions Precautions: Fall Precaution Comments: No hip precautions (direct anterior) Restrictions Weight Bearing Restrictions: Yes RLE Weight Bearing: Weight bearing as tolerated      Mobility Bed Mobility         Supine to sit: Supervision;HOB elevated     General bed mobility comments: pt up in recliner  Transfers Overall transfer level: Needs assistance Equipment used: Rolling walker (2 wheeled) Transfers: Sit to/from Stand Sit to Stand: Min assist Stand pivot transfers: Mod assist       General transfer comment: Min A to power up into stand and cues for safe technique and hand placement.     Balance Overall balance assessment: Needs assistance Sitting-balance support: No upper extremity supported;Feet supported Sitting balance-Leahy Scale: Good     Standing balance support: During functional activity Standing balance-Leahy Scale: Poor                              ADL Overall ADL's : Needs assistance/impaired     Grooming: Wash/dry hands;Wash/dry face;Sitting;Set up   Upper Body Bathing: Set up;Sitting   Lower Body Bathing: Maximal assistance   Upper Body Dressing : Set up;Sitting   Lower Body Dressing: Maximal assistance   Toilet Transfer: Minimal assistance;BSC;RW    Toileting- Clothing Manipulation and Hygiene: Moderate assistance;Sit to/from stand       Functional mobility during ADLs: Minimal assistance;Cueing for safety       Vision  wears glasses, no change from baseline   Perception Perception Perception Tested?: No   Praxis Praxis Praxis tested?: Not tested    Pertinent Vitals/Pain Pain Assessment: 0-10 Pain Score: 3  Pain Location: R hip Pain Descriptors / Indicators: Aching;Sore Pain Intervention(s): Monitored during session     Hand Dominance Right   Extremity/Trunk Assessment Upper Extremity Assessment Upper Extremity Assessment: Overall WFL for tasks assessed;Generalized weakness   Lower Extremity Assessment Lower Extremity Assessment: Defer to PT evaluation   Cervical / Trunk Assessment Cervical / Trunk Assessment: Normal   Communication Communication Communication: No difficulties   Cognition Arousal/Alertness: Awake/alert Behavior During Therapy: WFL for tasks assessed/performed Overall Cognitive Status: Within Functional Limits for tasks assessed                     General Comments   pt very pleasant and cooperative                 Home Living Family/patient expects to be discharged to:: Skilled nursing facility Living Arrangements: Alone   Type of Home: Independent living facility       Home Layout: One level               Home Equipment: None          Prior Functioning/Environment Level of Independence: Independent  OT Diagnosis: Generalized weakness;Acute pain   OT Problem List: Decreased strength;Decreased knowledge of use of DME or AE;Decreased activity tolerance;Impaired balance (sitting and/or standing);Pain   OT Treatment/Interventions: Self-care/ADL training;Patient/family education;Therapeutic activities;DME and/or AE instruction    OT Goals(Current goals can be found in the care plan section) Acute Rehab OT Goals Patient Stated Goal: Go to  rehab OT Goal Formulation: With patient Time For Goal Achievement: 03/30/15 Potential to Achieve Goals: Good ADL Goals Pt Will Perform Grooming: with set-up;with supervision;standing Pt Will Perform Lower Body Bathing: with mod assist;sitting/lateral leans;sit to/from stand Pt Will Transfer to Toilet: with min guard assist;with supervision;regular height toilet;ambulating;grab bars Pt Will Perform Toileting - Clothing Manipulation and hygiene: with min assist;sit to/from stand  OT Frequency: Min 2X/week   Barriers to D/C: Decreased caregiver support  will d/c to SNF for rehab                     End of Session Equipment Utilized During Treatment: Gait belt;Rolling walker;Other (comment) [facility]) Nurse Communication: Mobility status;Other (comment) (pt on BSC attempting to urinate)  Activity Tolerance: Patient tolerated treatment well Patient left: Other (comment) (Pt on BSC, her nurse is aware)   Time: 9381-0175 OT Time Calculation (min): 21 min Charges:  OT General Charges $OT Visit: 1 Procedure OT Evaluation $Initial OT Evaluation Tier I: 1 Procedure OT Treatments $Therapeutic Activity: 8-22 mins G-Codes:    Britt Bottom 03/23/2015, 3:12 PM

## 2015-03-23 NOTE — Clinical Social Work Placement (Signed)
   CLINICAL SOCIAL WORK PLACEMENT  NOTE  Date:  03/23/2015  Patient Details  Name: Catherine Coleman MRN: [id number] Date of Birth: Oct 27, 1931  Clinical Social Work is seeking post-discharge placement for this patient at the [place] level of care (*CSW will initial, date and re-position this form in  chart as items are completed):  Yes   Patient/family provided with [facility] Work Department's list of facilities offering this level of care within the geographic area requested by the patient (or if unable, by the patient's family).      Patient/family informed of their freedom to choose among providers that offer the needed level of care, that participate in Medicare, Medicaid or managed care program needed by the patient, have an available bed and are willing to accept the patient.      Patient/family informed of [facility]'s ownership interest in [facility] and [facility], as well as of the fact that they are under no obligation to receive care at these facilities.  PASRR submitted to EDS on       PASRR number received on       Existing PASRR number confirmed on 03/23/15     FL2 transmitted to all facilities in geographic area requested by pt/family on 03/23/15     FL2 transmitted to all facilities within larger geographic area on       Patient informed that his/her managed care company has contracts with or will negotiate with certain facilities, including the following:            Patient/family informed of bed offers received.  Patient chooses bed at [facility]     Physician recommends and patient chooses bed at [facility] [place]    Patient to be transferred to [facility] [place] on 03/24/15.  Patient to be transferred to facility by       Patient family notified on   of transfer.  Name of family member notified:        PHYSICIAN Please prepare prescriptions, Please sign FL2      Additional Comment:    _______________________________________________ Lilly Cove, LCSW 03/23/2015, 1:36 PM

## 2015-03-23 NOTE — Progress Notes (Signed)
LCSW aware of patient consult for possible DC on Sunday. Weekend Staff watching or DC orders and plans. Will facilitate DC once patient medically ready for DC!  Lane Hacker, MSW Clinical Social Work: Emergency Room [phone]

## 2015-03-23 NOTE — Progress Notes (Signed)
Physical Therapy Treatment Patient Details Name: Catherine Coleman MRN: [id number] DOB: 25-Jul-1932 Today's Date: 03/23/2015    History of Present Illness 81 y.o. female s/p right total hip arthroplasty.    PT Comments    Patient continues to have significant weakness and buckling of R LE in weight bearing. Able to transfer to the recliner but unsafe a ambulation trial. COntinue with current pOC. Continue to recommend SNF for ongoing Physical Therapy.     Follow Up Recommendations  SNF     Equipment Recommendations       Recommendations for Other Services       Precautions / Restrictions Precautions Precautions: Fall Precaution Comments: No hip precautions (direct anterior) Restrictions RLE Weight Bearing: Weight bearing as tolerated    Mobility  Bed Mobility         Supine to sit: Supervision;HOB elevated     General bed mobility comments: Supervision with VC for technique to approach edge of bed. Required extra time and use of rail with HOB elevated.   Transfers Overall transfer level: Needs assistance Equipment used: Rolling walker (2 wheeled) Transfers: Sit to/from [facility] Sit to Stand: Min assist Stand pivot transfers: Mod assist       General transfer comment: Min A to power up into stand and cues for safe technique and hand placement. Patient able to bear weight however continued to have increased buckling of R LE with weightbearing, able to SPT to recliner to the L side  Ambulation/Gait                 Stairs            Wheelchair Mobility    Modified Rankin (Stroke Patients Only)       Balance                                    Cognition Arousal/Alertness: Awake/alert Behavior During Therapy: WFL for tasks assessed/performed Overall Cognitive Status: Within Functional Limits for tasks assessed                      Exercises Total Joint Exercises Quad Sets: Strengthening;Both;10  reps;Supine Heel Slides: AAROM;Right;10 reps Long Arc Quad: Right;10 reps;AAROM    General Comments        Pertinent Vitals/Pain Pain Score: 2  Pain Location: Rt hip Pain Descriptors / Indicators: Aching;Sore Pain Intervention(s): Monitored during session    Home Living                      Prior Function            PT Goals (current goals can now be found in the care plan section) Progress towards PT goals: Progressing toward goals    Frequency  7X/week    PT Plan Current plan remains appropriate    Co-evaluation             End of Session   Activity Tolerance: Patient tolerated treatment well Patient left: in chair;with call bell/phone within reach     Time: 2992-4268 PT Time Calculation (min) (ACUTE ONLY): 16 min  Charges:  $Therapeutic Activity: 8-22 mins                    G Codes:      Jacqualyn Posey 03/23/2015, 1:14 PM 03/23/2015 Jacqualyn Posey PTA [phone] pager [phone] office

## 2015-03-24 LAB — CBC
HCT: 35.4 % — ABNORMAL LOW (ref 36.0–46.0)
HEMOGLOBIN: 12.1 g/dL (ref 12.0–15.0)
MCH: 32.3 pg (ref 26.0–34.0)
MCHC: 34.2 g/dL (ref 30.0–36.0)
MCV: 94.4 fL (ref 78.0–100.0)
Platelets: 190 10*3/uL (ref 150–400)
RBC: 3.75 MIL/uL — ABNORMAL LOW (ref 3.87–5.11)
RDW: 13 % (ref 11.5–15.5)
WBC: 9.2 10*3/uL (ref 4.0–10.5)

## 2015-03-24 MED ORDER — TRAMADOL HCL 50 MG PO TABS: 50.0000 mg | ORAL_TABLET | Freq: Four times a day (QID) | ORAL | Status: DC | PRN

## 2015-03-24 NOTE — Progress Notes (Signed)
Called report to [facility], [place]

## 2015-03-24 NOTE — Discharge Summary (Signed)
Physician Discharge Summary  Patient ID: Catherine Coleman MRN: [id number] DOB/AGE: [birth year] 81 y.o.  Admit date: 03/22/2015 Discharge date: 03/24/2015  Admission Diagnoses:  Primary osteoarthritis of right hip  Discharge Diagnoses:  Principal Problem:   Primary osteoarthritis of right hip   Past Medical History  Diagnosis Date  . Hypertension   . Hypothyroidism   . GERD (gastroesophageal reflux disease)   . Arthritis     Surgeries: Procedure(s): RIGHT TOTAL HIP ARTHROPLASTY ANTERIOR APPROACH on 03/22/2015   Consultants (if any):    Discharged Condition: Improved  Hospital Course: Catherine Coleman is an 81 y.o. female who was admitted 03/22/2015 with a diagnosis of Primary osteoarthritis of right hip and went to the operating room on 03/22/2015 and underwent the above named procedures.    She was given perioperative antibiotics:  Anti-infectives    Start     Dose/Rate Route Frequency Ordered Stop   03/22/15 1515  ceFAZolin (ANCEF) IVPB 2 g/50 mL premix     2 g 100 mL/hr over 30 Minutes Intravenous Every 6 hours 03/22/15 1504 03/22/15 1851   03/22/15 0851  ceFAZolin (ANCEF) IVPB 2 g/50 mL premix     2 g 100 mL/hr over 30 Minutes Intravenous On call to O.R. 03/22/15 1829 03/22/15 1056    .  She was given sequential compression devices, early ambulation, and ASA for DVT prophylaxis.  She benefited maximally from the hospital stay and there were no complications.  She did experience some temporary urinary retention postop that resolved spontaneously and was voiding without problem at the time of d/c, tolerating PO diet.  Recent vital signs:  Filed Vitals:   03/24/15 0451  BP: 142/74  Pulse: 55  Temp: 97.7 F (36.5 C)  Resp: 18    Recent laboratory studies:  Lab Results  Component Value Date   HGB 12.1 03/24/2015   HGB 12.2 03/23/2015   HGB 13.5 03/11/2015   Lab Results  Component Value Date   WBC 9.2 03/24/2015   PLT 190 03/24/2015   Lab Results  Component Value  Date   INR 0.98 03/11/2015   Lab Results  Component Value Date   NA 135 03/23/2015   K 4.4 03/23/2015   CL 102 03/23/2015   CO2 25 03/23/2015   BUN 11 03/23/2015   CREATININE 0.72 03/23/2015   GLUCOSE 173* 03/23/2015    Discharge Medications:     Medication List    TAKE these medications        aspirin EC 325 MG tablet  Take 1 tablet (325 mg total) by mouth 2 (two) times daily after a meal. Take x 1 month post op to decrease risk of blood clots.     brimonidine 0.2 % ophthalmic solution  Commonly known as:  ALPHAGAN  Place 1 drop into both eyes 2 (two) times daily.     levothyroxine 100 MCG tablet  Commonly known as:  SYNTHROID, LEVOTHROID  Take 100 mcg by mouth daily before breakfast.     methocarbamol 500 MG tablet  Commonly known as:  ROBAXIN  Take 1 tablet (500 mg total) by mouth every 8 (eight) hours as needed for muscle spasms.     multivitamin with minerals tablet  Take 1 tablet by mouth daily.     olmesartan 20 MG tablet  Commonly known as:  BENICAR  Take 20 mg by mouth daily.     omeprazole 20 MG capsule  Commonly known as:  PRILOSEC  Take 20 mg by mouth daily.  oxyCODONE-acetaminophen 5-325 MG per tablet  Commonly known as:  PERCOCET/ROXICET  Take 1-2 tablets by mouth every 6 (six) hours as needed for severe pain.     PROBIOTIC DAILY PO  Take 1 Can by mouth daily.     traMADol 50 MG tablet  Commonly known as:  ULTRAM  Take 1-2 tablets (50-100 mg total) by mouth every 6 (six) hours as needed for moderate pain or severe pain (not to exceed 8 tabs daily).        Diagnostic Studies: Dg Chest 2 View  03/11/2015   CLINICAL DATA:  Hyperthyroidism. Hypertension. Preop. Gastroesophageal reflux disease.  EXAM: CHEST  2 VIEW  COMPARISON:  None.  FINDINGS: Moderate thoracic spondylosis with moderate to severe convex left thoracic spine curvature. Bilateral shoulder arthroplasties. Midline trachea. Normal heart size. Tortuous thoracic aorta. No pleural  effusion or pneumothorax. Biapical pleural thickening. No congestive failure. Clear lungs. Cholecystectomy.  IMPRESSION: No acute cardiopulmonary disease.  Cardiomegaly without congestive failure.  Atherosclerosis.   Electronically Signed   By: Abigail Miyamoto M.D.   On: 03/11/2015 14:18   Dg Hip Operative Unilat With Pelvis Right  03/22/2015   CLINICAL DATA:  Hip replacement.  Initial evaluation .  EXAM: OPERATIVE right HIP (WITH PELVIS IF PERFORMED) 2 VIEWS  TECHNIQUE: Fluoroscopic spot image(s) were submitted for interpretation post-operatively.  COMPARISON:  None.  FINDINGS: Total right hip replacement with good anatomic alignment. Prior left hip replacement.  IMPRESSION: Total right hip replacement with good anatomic alignment.   Electronically Signed   By: Marcello Moores  Register   On: 03/22/2015 12:46    Disposition: Final discharge disposition not confirmed      Discharge Instructions    Weight bearing as tolerated    Complete by:  As directed   Laterality:  right  Extremity:  Lower  No hip precautions           Follow-up Information    Follow up with GRAVES,JOHN L, MD. Schedule an appointment as soon as possible for a visit in 2 weeks.   Specialty:  Orthopedic Surgery   Contact information:   [place] [id number] [phone]        Signed: [place] Silos, Jeremie Abdelaziz A. 03/24/2015, 10:04 AM

## 2015-03-24 NOTE — Progress Notes (Signed)
[facility]  Pt agreeable to plan. She will inform her family of pending transfer.

## 2015-03-24 NOTE — Clinical Social Work Placement (Signed)
   CLINICAL SOCIAL WORK PLACEMENT  NOTE  Date:  03/24/2015  Patient Details  Name: Catherine Coleman MRN: [id number] Date of Birth: August 05, 1932  Clinical Social Work is seeking post-discharge placement for this patient at the [facility] level of care (*CSW will initial, date and re-position this form in  chart as items are completed):  Yes   Patient/family provided with [facility] Work Department's list of facilities offering this level of care within the geographic area requested by the patient (or if unable, by the patient's family).      Patient/family informed of their freedom to choose among providers that offer the needed level of care, that participate in Medicare, Medicaid or managed care program needed by the patient, have an available bed and are willing to accept the patient.      Patient/family informed of [facility]'s ownership interest in [facility] and [facility], as well as of the fact that they are under no obligation to receive care at these facilities.  PASRR submitted to EDS on       PASRR number received on       Existing PASRR number confirmed on 03/23/15     FL2 transmitted to all facilities in geographic area requested by pt/family on 03/23/15     FL2 transmitted to all facilities within larger geographic area on       Patient informed that his/her managed care company has contracts with or will negotiate with certain facilities, including the following:            Patient/family informed of bed offers received.  Patient chooses bed at [facility]     Physician recommends and patient chooses bed at [facility] [place]    Patient to be transferred to [facility] [place] on 03/24/15.  Patient to be transferred to facility by   Ambulance    Patient family notified on   of transfer.03/24/15  Name of family member notified:      Pt to call dtr.  PHYSICIAN Please prepare  prescriptions, Please sign FL2     Additional Comment:    _______________________________________________ Roanna Raider, LCSW 03/24/2015, 11:16 AM

## 2015-03-25 ENCOUNTER — Encounter (HOSPITAL_COMMUNITY): Payer: Self-pay | Admitting: Orthopedic Surgery

## 2015-09-18 DIAGNOSIS — L601 Onycholysis: Secondary | ICD-10-CM | POA: Diagnosis not present

## 2015-09-19 DIAGNOSIS — R202 Paresthesia of skin: Secondary | ICD-10-CM | POA: Diagnosis not present

## 2015-09-19 DIAGNOSIS — M5417 Radiculopathy, lumbosacral region: Secondary | ICD-10-CM | POA: Diagnosis not present

## 2015-09-19 DIAGNOSIS — G603 Idiopathic progressive neuropathy: Secondary | ICD-10-CM | POA: Diagnosis not present

## 2015-09-19 DIAGNOSIS — G5602 Carpal tunnel syndrome, left upper limb: Secondary | ICD-10-CM | POA: Diagnosis not present

## 2015-09-27 DIAGNOSIS — M6281 Muscle weakness (generalized): Secondary | ICD-10-CM | POA: Diagnosis not present

## 2015-09-27 DIAGNOSIS — R262 Difficulty in walking, not elsewhere classified: Secondary | ICD-10-CM | POA: Diagnosis not present

## 2015-09-27 DIAGNOSIS — M5416 Radiculopathy, lumbar region: Secondary | ICD-10-CM | POA: Diagnosis not present

## 2015-09-30 DIAGNOSIS — M6281 Muscle weakness (generalized): Secondary | ICD-10-CM | POA: Diagnosis not present

## 2015-09-30 DIAGNOSIS — R262 Difficulty in walking, not elsewhere classified: Secondary | ICD-10-CM | POA: Diagnosis not present

## 2015-09-30 DIAGNOSIS — M5416 Radiculopathy, lumbar region: Secondary | ICD-10-CM | POA: Diagnosis not present

## 2015-10-01 DIAGNOSIS — E0590 Thyrotoxicosis, unspecified without thyrotoxic crisis or storm: Secondary | ICD-10-CM | POA: Diagnosis not present

## 2015-10-01 DIAGNOSIS — K219 Gastro-esophageal reflux disease without esophagitis: Secondary | ICD-10-CM | POA: Diagnosis not present

## 2015-10-01 DIAGNOSIS — M5416 Radiculopathy, lumbar region: Secondary | ICD-10-CM | POA: Diagnosis not present

## 2015-10-01 DIAGNOSIS — E039 Hypothyroidism, unspecified: Secondary | ICD-10-CM | POA: Diagnosis not present

## 2015-10-02 DIAGNOSIS — R262 Difficulty in walking, not elsewhere classified: Secondary | ICD-10-CM | POA: Diagnosis not present

## 2015-10-02 DIAGNOSIS — M5416 Radiculopathy, lumbar region: Secondary | ICD-10-CM | POA: Diagnosis not present

## 2015-10-02 DIAGNOSIS — M6281 Muscle weakness (generalized): Secondary | ICD-10-CM | POA: Diagnosis not present

## 2015-10-04 DIAGNOSIS — M6281 Muscle weakness (generalized): Secondary | ICD-10-CM | POA: Diagnosis not present

## 2015-10-04 DIAGNOSIS — M5416 Radiculopathy, lumbar region: Secondary | ICD-10-CM | POA: Diagnosis not present

## 2015-10-04 DIAGNOSIS — R262 Difficulty in walking, not elsewhere classified: Secondary | ICD-10-CM | POA: Diagnosis not present

## 2015-10-08 DIAGNOSIS — R262 Difficulty in walking, not elsewhere classified: Secondary | ICD-10-CM | POA: Diagnosis not present

## 2015-10-08 DIAGNOSIS — M6281 Muscle weakness (generalized): Secondary | ICD-10-CM | POA: Diagnosis not present

## 2015-10-08 DIAGNOSIS — M5416 Radiculopathy, lumbar region: Secondary | ICD-10-CM | POA: Diagnosis not present

## 2015-10-15 DIAGNOSIS — R262 Difficulty in walking, not elsewhere classified: Secondary | ICD-10-CM | POA: Diagnosis not present

## 2015-10-15 DIAGNOSIS — M5416 Radiculopathy, lumbar region: Secondary | ICD-10-CM | POA: Diagnosis not present

## 2015-10-15 DIAGNOSIS — M6281 Muscle weakness (generalized): Secondary | ICD-10-CM | POA: Diagnosis not present

## 2015-10-16 DIAGNOSIS — L821 Other seborrheic keratosis: Secondary | ICD-10-CM | POA: Diagnosis not present

## 2015-10-17 DIAGNOSIS — M8580 Other specified disorders of bone density and structure, unspecified site: Secondary | ICD-10-CM | POA: Diagnosis not present

## 2015-10-17 DIAGNOSIS — Z1231 Encounter for screening mammogram for malignant neoplasm of breast: Secondary | ICD-10-CM | POA: Diagnosis not present

## 2015-10-17 DIAGNOSIS — K219 Gastro-esophageal reflux disease without esophagitis: Secondary | ICD-10-CM | POA: Diagnosis not present

## 2015-10-17 DIAGNOSIS — M810 Age-related osteoporosis without current pathological fracture: Secondary | ICD-10-CM | POA: Diagnosis not present

## 2015-10-17 DIAGNOSIS — Z6833 Body mass index (BMI) 33.0-33.9, adult: Secondary | ICD-10-CM | POA: Diagnosis not present

## 2015-11-05 DIAGNOSIS — M8580 Other specified disorders of bone density and structure, unspecified site: Secondary | ICD-10-CM | POA: Diagnosis not present

## 2015-11-05 DIAGNOSIS — N951 Menopausal and female climacteric states: Secondary | ICD-10-CM | POA: Diagnosis not present

## 2015-11-05 DIAGNOSIS — Z780 Asymptomatic menopausal state: Secondary | ICD-10-CM | POA: Diagnosis not present

## 2015-11-05 DIAGNOSIS — M85832 Other specified disorders of bone density and structure, left forearm: Secondary | ICD-10-CM | POA: Diagnosis not present

## 2015-11-05 DIAGNOSIS — Z1231 Encounter for screening mammogram for malignant neoplasm of breast: Secondary | ICD-10-CM | POA: Diagnosis not present

## 2015-11-05 DIAGNOSIS — Z13820 Encounter for screening for osteoporosis: Secondary | ICD-10-CM | POA: Diagnosis not present

## 2015-11-22 DIAGNOSIS — H401112 Primary open-angle glaucoma, right eye, moderate stage: Secondary | ICD-10-CM | POA: Diagnosis not present

## 2015-11-22 DIAGNOSIS — H401122 Primary open-angle glaucoma, left eye, moderate stage: Secondary | ICD-10-CM | POA: Diagnosis not present

## 2015-11-28 DIAGNOSIS — M1610 Unilateral primary osteoarthritis, unspecified hip: Secondary | ICD-10-CM | POA: Diagnosis not present

## 2015-11-28 DIAGNOSIS — J301 Allergic rhinitis due to pollen: Secondary | ICD-10-CM | POA: Diagnosis not present

## 2015-11-28 DIAGNOSIS — J0101 Acute recurrent maxillary sinusitis: Secondary | ICD-10-CM | POA: Diagnosis not present

## 2016-01-29 DIAGNOSIS — H401122 Primary open-angle glaucoma, left eye, moderate stage: Secondary | ICD-10-CM | POA: Diagnosis not present

## 2016-02-04 DIAGNOSIS — M5441 Lumbago with sciatica, right side: Secondary | ICD-10-CM | POA: Diagnosis not present

## 2016-03-24 DIAGNOSIS — K1120 Sialoadenitis, unspecified: Secondary | ICD-10-CM | POA: Diagnosis not present

## 2016-04-01 ENCOUNTER — Telehealth: Payer: Self-pay

## 2016-04-01 NOTE — Telephone Encounter (Signed)
Patient scheduled for New Patient visit with Dr. [name] for tomorrow. Pre visit call completed however, patient may have to be rescheduled.

## 2016-04-02 ENCOUNTER — Ambulatory Visit (INDEPENDENT_AMBULATORY_CARE_PROVIDER_SITE_OTHER): Payer: Medicare Other | Admitting: Family Medicine

## 2016-04-02 ENCOUNTER — Encounter: Payer: Self-pay | Admitting: Family Medicine

## 2016-04-02 VITALS — BP 132/86 | HR 78 | Temp 98.2°F | Ht 65.0 in | Wt 211.0 lb

## 2016-04-02 DIAGNOSIS — J310 Chronic rhinitis: Secondary | ICD-10-CM

## 2016-04-02 DIAGNOSIS — H409 Unspecified glaucoma: Secondary | ICD-10-CM | POA: Insufficient documentation

## 2016-04-02 DIAGNOSIS — E559 Vitamin D deficiency, unspecified: Secondary | ICD-10-CM

## 2016-04-02 DIAGNOSIS — K1120 Sialoadenitis, unspecified: Secondary | ICD-10-CM

## 2016-04-02 DIAGNOSIS — M8580 Other specified disorders of bone density and structure, unspecified site: Secondary | ICD-10-CM

## 2016-04-02 DIAGNOSIS — E039 Hypothyroidism, unspecified: Secondary | ICD-10-CM | POA: Diagnosis not present

## 2016-04-02 DIAGNOSIS — M1990 Unspecified osteoarthritis, unspecified site: Secondary | ICD-10-CM | POA: Insufficient documentation

## 2016-04-02 DIAGNOSIS — E785 Hyperlipidemia, unspecified: Secondary | ICD-10-CM

## 2016-04-02 DIAGNOSIS — E669 Obesity, unspecified: Secondary | ICD-10-CM | POA: Diagnosis not present

## 2016-04-02 DIAGNOSIS — Z1239 Encounter for other screening for malignant neoplasm of breast: Secondary | ICD-10-CM

## 2016-04-02 DIAGNOSIS — K5730 Diverticulosis of large intestine without perforation or abscess without bleeding: Secondary | ICD-10-CM | POA: Diagnosis not present

## 2016-04-02 DIAGNOSIS — K219 Gastro-esophageal reflux disease without esophagitis: Secondary | ICD-10-CM

## 2016-04-02 DIAGNOSIS — E1169 Type 2 diabetes mellitus with other specified complication: Secondary | ICD-10-CM | POA: Insufficient documentation

## 2016-04-02 DIAGNOSIS — I10 Essential (primary) hypertension: Secondary | ICD-10-CM | POA: Insufficient documentation

## 2016-04-02 DIAGNOSIS — K449 Diaphragmatic hernia without obstruction or gangrene: Secondary | ICD-10-CM

## 2016-04-02 HISTORY — DX: Vitamin D deficiency, unspecified: E55.9

## 2016-04-02 HISTORY — DX: Hyperlipidemia, unspecified: E78.5

## 2016-04-02 HISTORY — DX: Obesity, unspecified: E66.9

## 2016-04-02 LAB — COMPREHENSIVE METABOLIC PANEL
ALBUMIN: 4.6 g/dL (ref 3.5–5.2)
ALK PHOS: 83 U/L (ref 39–117)
ALT: 17 U/L (ref 0–35)
AST: 22 U/L (ref 0–37)
BUN: 15 mg/dL (ref 6–23)
CO2: 26 mEq/L (ref 19–32)
Calcium: 10.2 mg/dL (ref 8.4–10.5)
Chloride: 96 mEq/L (ref 96–112)
Creatinine, Ser: 0.81 mg/dL (ref 0.40–1.20)
GFR: 71.92 mL/min (ref >60.00)
Glucose, Bld: 116 mg/dL — ABNORMAL HIGH (ref 70–99)
Potassium: 4.4 mEq/L (ref 3.5–5.1)
Sodium: 133 mEq/L — ABNORMAL LOW (ref 135–145)
TOTAL PROTEIN: 7.5 g/dL (ref 6.0–8.3)
Total Bilirubin: 0.4 mg/dL (ref 0.2–1.2)

## 2016-04-02 LAB — CBC
HCT: 41.3 % (ref 36.0–46.0)
Hemoglobin: 13.9 g/dL (ref 12.0–15.0)
MCHC: 33.7 g/dL (ref 30.0–36.0)
MCV: 94 fl (ref 78.0–100.0)
Platelets: 293 10*3/uL (ref 150.0–400.0)
RBC: 4.4 Mil/uL (ref 3.87–5.11)
RDW: 13.2 % (ref 11.5–15.5)
WBC: 6.5 10*3/uL (ref 4.0–10.5)

## 2016-04-02 LAB — LIPID PANEL
CHOLESTEROL: 226 mg/dL — AB (ref 0–200)
HDL: 71.1 mg/dL (ref >39.00)
LDL Cholesterol: 127 mg/dL — ABNORMAL HIGH (ref 0–99)
NonHDL: 154.73
TRIGLYCERIDES: 138 mg/dL (ref 0.0–149.0)
Total CHOL/HDL Ratio: 3
VLDL: 27.6 mg/dL (ref 0.0–40.0)

## 2016-04-02 LAB — TSH: TSH: 1.91 u[IU]/mL (ref 0.35–4.50)

## 2016-04-02 LAB — VITAMIN D 25 HYDROXY (VIT D DEFICIENCY, FRACTURES): VITD: 39.24 ng/mL (ref 30.00–100.00)

## 2016-04-02 MED ORDER — RANITIDINE HCL 300 MG PO CAPS: 300.0000 mg | ORAL_CAPSULE | Freq: Every evening | ORAL | Status: DC

## 2016-04-02 MED ORDER — VITAMIN D 50 MCG (2000 UT) PO CAPS: 2000.0000 [IU] | ORAL_CAPSULE | Freq: Every day | ORAL | Status: DC

## 2016-04-02 NOTE — Progress Notes (Signed)
Pre visit review using our clinic review tool, if applicable. No additional management support is needed unless otherwise documented below in the visit note. 

## 2016-04-02 NOTE — Patient Instructions (Addendum)
[place], 10 strain probiotic cap 1 daily Vitamin D 2000 IU daily   Food Choices for Gastroesophageal Reflux Disease, Adult When you have gastroesophageal reflux disease (GERD), the foods you eat and your eating habits are very important. Choosing the right foods can help ease the discomfort of GERD. WHAT GENERAL GUIDELINES DO I NEED TO FOLLOW?  Choose fruits, vegetables, whole grains, low-fat dairy products, and low-fat meat, fish, and poultry.  Limit fats such as oils, salad dressings, butter, nuts, and avocado.  Keep a food diary to identify foods that cause symptoms.  Avoid foods that cause reflux. These may be different for different people.  Eat frequent small meals instead of three large meals each day.  Eat your meals slowly, in a relaxed setting.  Limit fried foods.  Cook foods using methods other than frying.  Avoid drinking alcohol.  Avoid drinking large amounts of liquids with your meals.  Avoid bending over or lying down until 2-3 hours after eating. WHAT FOODS ARE NOT RECOMMENDED? The following are some foods and drinks that may worsen your symptoms: Vegetables Tomatoes. Tomato juice. Tomato and spaghetti sauce. Chili peppers. Onion and garlic. Horseradish. Fruits Oranges, grapefruit, and lemon (fruit and juice). Meats High-fat meats, fish, and poultry. This includes hot dogs, ribs, ham, sausage, salami, and bacon. Dairy Whole milk and chocolate milk. Sour cream. Cream. Butter. Ice cream. Cream cheese.  Beverages Coffee and tea, with or without caffeine. Carbonated beverages or energy drinks. Condiments Hot sauce. Barbecue sauce.  Sweets/Desserts Chocolate and cocoa. Donuts. Peppermint and spearmint. Fats and Oils High-fat foods, including [place] fries and potato chips. Other Vinegar. Strong spices, such as black pepper, white pepper, red pepper, cayenne, curry powder, cloves, ginger, and chili powder. The items listed above may not be a complete list  of foods and beverages to avoid. Contact your dietitian for more information.   This information is not intended to replace advice given to you by your health care provider. Make sure you discuss any questions you have with your health care provider.   Document Released: 08/31/2005 Document Revised: 09/21/2014 Document Reviewed: 07/05/2013 Elsevier Interactive Patient Education [employer].

## 2016-04-03 DIAGNOSIS — H401132 Primary open-angle glaucoma, bilateral, moderate stage: Secondary | ICD-10-CM | POA: Diagnosis not present

## 2016-04-08 ENCOUNTER — Other Ambulatory Visit (HOSPITAL_BASED_OUTPATIENT_CLINIC_OR_DEPARTMENT_OTHER): Payer: Medicare Other

## 2016-04-15 ENCOUNTER — Ambulatory Visit (HOSPITAL_BASED_OUTPATIENT_CLINIC_OR_DEPARTMENT_OTHER)
Admission: RE | Admit: 2016-04-15 | Discharge: 2016-04-15 | Disposition: A | Discharge status: Home / Self Care | Payer: Medicare Other | Source: Ambulatory Visit | Attending: Family Medicine | Admitting: Family Medicine

## 2016-04-15 DIAGNOSIS — M8580 Other specified disorders of bone density and structure, unspecified site: Secondary | ICD-10-CM

## 2016-04-15 DIAGNOSIS — M8588 Other specified disorders of bone density and structure, other site: Secondary | ICD-10-CM | POA: Diagnosis not present

## 2016-04-15 DIAGNOSIS — E559 Vitamin D deficiency, unspecified: Secondary | ICD-10-CM | POA: Diagnosis not present

## 2016-04-15 DIAGNOSIS — M85832 Other specified disorders of bone density and structure, left forearm: Secondary | ICD-10-CM | POA: Insufficient documentation

## 2016-04-15 DIAGNOSIS — Z96643 Presence of artificial hip joint, bilateral: Secondary | ICD-10-CM | POA: Insufficient documentation

## 2016-04-21 ENCOUNTER — Encounter: Payer: Self-pay | Admitting: Family Medicine

## 2016-04-21 DIAGNOSIS — J310 Chronic rhinitis: Secondary | ICD-10-CM

## 2016-04-21 DIAGNOSIS — K1120 Sialoadenitis, unspecified: Secondary | ICD-10-CM | POA: Insufficient documentation

## 2016-04-21 DIAGNOSIS — Z1239 Encounter for other screening for malignant neoplasm of breast: Secondary | ICD-10-CM | POA: Insufficient documentation

## 2016-04-21 DIAGNOSIS — Z0000 Encounter for general adult medical examination without abnormal findings: Secondary | ICD-10-CM | POA: Insufficient documentation

## 2016-04-21 HISTORY — DX: Sialoadenitis, unspecified: K11.20

## 2016-04-21 HISTORY — DX: Chronic rhinitis: J31.0

## 2016-04-21 NOTE — Assessment & Plan Note (Signed)
On Levothyroxine, continue to monitor 

## 2016-04-21 NOTE — Assessment & Plan Note (Signed)
[employer]GM ordered today

## 2016-04-21 NOTE — Assessment & Plan Note (Signed)
Can consider astelin but declines for now

## 2016-04-21 NOTE — Assessment & Plan Note (Signed)
Avoid offending foods, start probiotics. Do not eat large meals in late evening and consider raising head of bed.  

## 2016-04-21 NOTE — Assessment & Plan Note (Signed)
Encouraged daily supplements 

## 2016-04-21 NOTE — Progress Notes (Signed)
Patient ID: Catherine Coleman, female   DOB: [birth year], 82 y.o.   MRN: [id number]   Subjective:    Patient ID: Catherine Coleman, female    DOB: 1931/10/10, 82 y.o.   MRN: [id number]  Chief Complaint  Patient presents with  . Establish Care    HPI Patient is in today for new patient appointment. She has a PMH of hypertension, hiatal hernia with reflux, hypothroidism, hyperlipidemia among other diagnosis. Hoping to stop taking Omeprazole which she has been taking for years. Has just finished a course of Clindamycin for an infected parotid gland. Tolerated well. Denies CP/palp/SOB/HA/congestion/fevers/GI or GU c/o. Taking meds as prescribed  Past Medical History:  Diagnosis Date  . Arthritis   . Atrophic rhinitis 04/21/2016  . Diverticulitis   . GERD (gastroesophageal reflux disease)   . Glaucoma   . Hiatal hernia with gastroesophageal reflux   . History of blood transfusion    for Knee replacement  . Hyperlipidemia, mild 04/02/2016  . Hypertension   . Hypothyroidism   . Obesity 04/02/2016  . Parotiditis 04/21/2016  . Vitamin D deficiency 04/02/2016    Past Surgical History:  Procedure Laterality Date  . APPENDECTOMY    . cataract surgery  2008  . CHOLECYSTECTOMY  2010  . JOINT REPLACEMENT Left    2012  . SHOULDER ARTHROSCOPY Bilateral prior to 2010  . TONSILLECTOMY    . TOTAL HIP ARTHROPLASTY Right 03/22/2015   Procedure: RIGHT TOTAL HIP ARTHROPLASTY ANTERIOR APPROACH;  Surgeon: Dorna Leitz, MD;  Location: [facility];  Service: Orthopedics;  Laterality: Right;  . TOTAL KNEE ARTHROPLASTY Bilateral 2007  . TUBAL LIGATION      Family History  Problem Relation Age of Onset  . Dementia Mother   . Hypertension Mother   . Heart disease Father   . Hypertension Father   . Stroke Father   . Diabetes Father   . Arthritis Daughter   . Cancer Maternal Grandfather     colon cancer    Social History   Social History  . Marital status: Widowed    Spouse name: N/A  . Number of children: N/A    . Years of education: N/A   Occupational History  . Retired    Social History Main Topics  . Smoking status: Former Research scientist (life sciences)  . Smokeless tobacco: Not on file     Comment: Stopped 50 years ago.   . Alcohol use 0.0 oz/week     Comment: socially  . Drug use: No  . Sexual activity: Not on file   Other Topics Concern  . Not on file   Social History Narrative   Lives at [facility], widowed 7 years, no dietary restrictions. Retired from neuropsychiatry work.     Outpatient Medications Prior to Visit  Medication Sig Dispense Refill  . brimonidine (ALPHAGAN) 0.2 % ophthalmic solution Place 1 drop into both eyes 2 (two) times daily.    Marland Kitchen levothyroxine (SYNTHROID, LEVOTHROID) 100 MCG tablet Take 100 mcg by mouth daily before breakfast.    . losartan (COZAAR) 50 MG tablet Take 50 mg by mouth daily.    . Multiple Vitamins-Minerals (MULTIVITAMIN WITH MINERALS) tablet Take 1 tablet by mouth daily.    Marland Kitchen omeprazole (PRILOSEC) 20 MG capsule Take 20 mg by mouth daily.    . Probiotic Product (PROBIOTIC DAILY PO) Take 1 Can by mouth daily.    Marland Kitchen aspirin EC 325 MG tablet Take 1 tablet (325 mg total) by mouth 2 (two) times daily after a meal. Take  x 1 month post op to decrease risk of blood clots. (Patient not taking: Reported on 04/02/2016) 60 tablet 0  . methocarbamol (ROBAXIN) 500 MG tablet Take 1 tablet (500 mg total) by mouth every 8 (eight) hours as needed for muscle spasms. (Patient not taking: Reported on 04/01/2016) 50 tablet 0  . olmesartan (BENICAR) 20 MG tablet Take 20 mg by mouth daily. Reported on 04/01/2016    . oxyCODONE-acetaminophen (PERCOCET/ROXICET) 5-325 MG per tablet Take 1-2 tablets by mouth every 6 (six) hours as needed for severe pain. (Patient not taking: Reported on 04/01/2016) 60 tablet 0  . traMADol (ULTRAM) 50 MG tablet Take 1-2 tablets (50-100 mg total) by mouth every 6 (six) hours as needed for moderate pain or severe pain (not to exceed 8 tabs daily). (Patient not taking:  Reported on 04/01/2016) 60 tablet 0   No facility-administered medications prior to visit.     No Known Allergies  Review of Systems  Constitutional: Negative for chills, fever and malaise/fatigue.  HENT: Negative for congestion and hearing loss.   Eyes: Negative for discharge.  Respiratory: Negative for cough, sputum production and shortness of breath.   Cardiovascular: Negative for chest pain, palpitations and leg swelling.  Gastrointestinal: Positive for heartburn. Negative for abdominal pain, blood in stool, constipation, diarrhea, nausea and vomiting.  Genitourinary: Negative for dysuria, frequency, hematuria and urgency.  Musculoskeletal: Negative for back pain, falls and myalgias.  Skin: Negative for rash.  Neurological: Negative for dizziness, sensory change, loss of consciousness, weakness and headaches.  Endo/Heme/Allergies: Negative for environmental allergies. Does not bruise/bleed easily.  Psychiatric/Behavioral: Negative for depression and suicidal ideas. The patient is not nervous/anxious and does not have insomnia.        Objective:    Physical Exam  Constitutional: She is oriented to person, place, and time. She appears well-developed and well-nourished. No distress.  HENT:  Head: Normocephalic and atraumatic.  Eyes: Conjunctivae are normal.  Neck: Neck supple. No thyromegaly present.  Cardiovascular: Normal rate, regular rhythm and normal heart sounds.   No murmur heard. Pulmonary/Chest: Effort normal and breath sounds normal. No respiratory distress.  Abdominal: Soft. Bowel sounds are normal. She exhibits no distension and no mass. There is no tenderness.  Musculoskeletal: She exhibits no edema.  Lymphadenopathy:    She has no cervical adenopathy.  Neurological: She is alert and oriented to person, place, and time.  Skin: Skin is warm and dry.  Psychiatric: She has a normal mood and affect. Her behavior is normal.    BP 132/86   Pulse 78   Temp 98.2 F  (36.8 C) (Oral)   Ht 5\' 5"  (1.651 m)   Wt 211 lb (95.7 kg)   SpO2 93%   BMI 35.11 kg/m  Wt Readings from Last 3 Encounters:  04/02/16 211 lb (95.7 kg)  03/22/15 197 lb 3.2 oz (89.4 kg)  03/11/15 197 lb 3.2 oz (89.4 kg)     Lab Results  Component Value Date   WBC 6.5 04/02/2016   HGB 13.9 04/02/2016   HCT 41.3 04/02/2016   PLT 293.0 04/02/2016   GLUCOSE 116 (H) 04/02/2016   CHOL 226 (H) 04/02/2016   TRIG 138.0 04/02/2016   HDL 71.10 04/02/2016   LDLCALC 127 (H) 04/02/2016   ALT 17 04/02/2016   AST 22 04/02/2016   NA 133 (L) 04/02/2016   K 4.4 04/02/2016   CL 96 04/02/2016   CREATININE 0.81 04/02/2016   BUN 15 04/02/2016   CO2 26 04/02/2016  TSH 1.91 04/02/2016   INR 0.98 03/11/2015    Lab Results  Component Value Date   TSH 1.91 04/02/2016   Lab Results  Component Value Date   WBC 6.5 04/02/2016   HGB 13.9 04/02/2016   HCT 41.3 04/02/2016   MCV 94.0 04/02/2016   PLT 293.0 04/02/2016   Lab Results  Component Value Date   NA 133 (L) 04/02/2016   K 4.4 04/02/2016   CO2 26 04/02/2016   GLUCOSE 116 (H) 04/02/2016   BUN 15 04/02/2016   CREATININE 0.81 04/02/2016   BILITOT 0.4 04/02/2016   ALKPHOS 83 04/02/2016   AST 22 04/02/2016   ALT 17 04/02/2016   PROT 7.5 04/02/2016   ALBUMIN 4.6 04/02/2016   CALCIUM 10.2 04/02/2016   ANIONGAP 8 03/23/2015   GFR 71.92 04/02/2016   Lab Results  Component Value Date   CHOL 226 (H) 04/02/2016   Lab Results  Component Value Date   HDL 71.10 04/02/2016   Lab Results  Component Value Date   LDLCALC 127 (H) 04/02/2016   Lab Results  Component Value Date   TRIG 138.0 04/02/2016   Lab Results  Component Value Date   CHOLHDL 3 04/02/2016   No results found for: HGBA1C     Assessment & Plan:   Problem List Items Addressed This Visit    Hypertension    Improved on recheck. Well controlled, no changes to meds. Encouraged heart healthy diet such as the DASH diet and exercise as tolerated.        Relevant Orders   Vitamin D (25 hydroxy) (Completed)   TSH (Completed)   CBC (Completed)   Lipid panel (Completed)   Comprehensive metabolic panel (Completed)   Hiatal hernia with gastroesophageal reflux    Avoid offending foods, start probiotics. Do not eat large meals in late evening and consider raising head of bed.       Relevant Medications   ranitidine (ZANTAC) 300 MG capsule   Other Relevant Orders   Vitamin D (25 hydroxy) (Completed)   TSH (Completed)   CBC (Completed)   Lipid panel (Completed)   Comprehensive metabolic panel (Completed)   Hypothyroidism    On Levothyroxine, continue to monitor      Relevant Orders   Vitamin D (25 hydroxy) (Completed)   TSH (Completed)   CBC (Completed)   Lipid panel (Completed)   Comprehensive metabolic panel (Completed)   Obesity - Primary   Relevant Orders   Vitamin D (25 hydroxy) (Completed)   TSH (Completed)   CBC (Completed)   Lipid panel (Completed)   Comprehensive metabolic panel (Completed)   Diverticulosis of colon without hemorrhage   Relevant Orders   Vitamin D (25 hydroxy) (Completed)   TSH (Completed)   CBC (Completed)   Lipid panel (Completed)   Comprehensive metabolic panel (Completed)   Vitamin D deficiency    Encouraged daily supplements      Relevant Orders   DG Bone Density (Completed)   Vitamin D (25 hydroxy) (Completed)   TSH (Completed)   CBC (Completed)   Lipid panel (Completed)   Comprehensive metabolic panel (Completed)   Hyperlipidemia, mild   Relevant Orders   Vitamin D (25 hydroxy) (Completed)   TSH (Completed)   CBC (Completed)   Lipid panel (Completed)   Comprehensive metabolic panel (Completed)   Parotiditis    Has just finished course of Clindamycin, doing better.       Breast cancer screening   Relevant Orders   MM Digital Screening  Atrophic rhinitis    Can consider astelin but declines for now       Other Visit Diagnoses    Osteopenia       Relevant Orders   DG  Bone Density (Completed)   Vitamin D (25 hydroxy) (Completed)   TSH (Completed)   CBC (Completed)   Lipid panel (Completed)   Comprehensive metabolic panel (Completed)      I have discontinued Ms. Verga's olmesartan, methocarbamol, oxyCODONE-acetaminophen, and traMADol. I am also having her start on ranitidine and Vitamin D. Additionally, I am having her maintain her omeprazole, levothyroxine, brimonidine, multivitamin with minerals, Probiotic Product (PROBIOTIC DAILY PO), aspirin EC, and losartan.  Meds ordered this encounter  Medications  . ranitidine (ZANTAC) 300 MG capsule    Sig: Take 1 capsule (300 mg total) by mouth every evening.    Dispense:  90 capsule    Refill:  1  . Cholecalciferol (VITAMIN D) 2000 units CAPS    Sig: Take 1 capsule (2,000 Units total) by mouth daily.    Dispense:  30 capsule    Refill:  5     Penni Homans, MD

## 2016-04-21 NOTE — Assessment & Plan Note (Signed)
Improved on recheck. Well controlled, no changes to meds. Encouraged heart healthy diet such as the DASH diet and exercise as tolerated.  

## 2016-04-21 NOTE — Assessment & Plan Note (Signed)
Has just finished course of Clindamycin, doing better.

## 2016-06-26 ENCOUNTER — Ambulatory Visit: Payer: Medicare Other | Admitting: Family Medicine

## 2016-07-24 DIAGNOSIS — J0100 Acute maxillary sinusitis, unspecified: Secondary | ICD-10-CM | POA: Diagnosis not present

## 2016-07-31 ENCOUNTER — Ambulatory Visit: Payer: Medicare Other | Admitting: Family Medicine

## 2016-08-18 DIAGNOSIS — L820 Inflamed seborrheic keratosis: Secondary | ICD-10-CM | POA: Diagnosis not present

## 2016-09-10 DIAGNOSIS — R040 Epistaxis: Secondary | ICD-10-CM | POA: Diagnosis not present

## 2016-09-10 DIAGNOSIS — I10 Essential (primary) hypertension: Secondary | ICD-10-CM | POA: Diagnosis not present

## 2016-09-18 DIAGNOSIS — M8580 Other specified disorders of bone density and structure, unspecified site: Secondary | ICD-10-CM | POA: Diagnosis not present

## 2016-09-18 DIAGNOSIS — I10 Essential (primary) hypertension: Secondary | ICD-10-CM | POA: Diagnosis not present

## 2016-09-18 DIAGNOSIS — Z6834 Body mass index (BMI) 34.0-34.9, adult: Secondary | ICD-10-CM | POA: Diagnosis not present

## 2016-09-29 DIAGNOSIS — H524 Presbyopia: Secondary | ICD-10-CM | POA: Diagnosis not present

## 2016-09-29 DIAGNOSIS — H40013 Open angle with borderline findings, low risk, bilateral: Secondary | ICD-10-CM | POA: Diagnosis not present

## 2016-12-03 DIAGNOSIS — R42 Dizziness and giddiness: Secondary | ICD-10-CM | POA: Diagnosis not present

## 2016-12-23 DIAGNOSIS — E785 Hyperlipidemia, unspecified: Secondary | ICD-10-CM | POA: Diagnosis not present

## 2016-12-23 DIAGNOSIS — E039 Hypothyroidism, unspecified: Secondary | ICD-10-CM | POA: Diagnosis not present

## 2016-12-23 DIAGNOSIS — R209 Unspecified disturbances of skin sensation: Secondary | ICD-10-CM | POA: Diagnosis not present

## 2016-12-23 DIAGNOSIS — E559 Vitamin D deficiency, unspecified: Secondary | ICD-10-CM | POA: Diagnosis not present

## 2016-12-23 DIAGNOSIS — D649 Anemia, unspecified: Secondary | ICD-10-CM | POA: Diagnosis not present

## 2016-12-23 DIAGNOSIS — R269 Unspecified abnormalities of gait and mobility: Secondary | ICD-10-CM | POA: Diagnosis not present

## 2016-12-23 DIAGNOSIS — E784 Other hyperlipidemia: Secondary | ICD-10-CM | POA: Diagnosis not present

## 2016-12-23 DIAGNOSIS — I10 Essential (primary) hypertension: Secondary | ICD-10-CM | POA: Diagnosis not present

## 2017-01-01 DIAGNOSIS — I10 Essential (primary) hypertension: Secondary | ICD-10-CM | POA: Diagnosis not present

## 2017-01-01 DIAGNOSIS — E0590 Thyrotoxicosis, unspecified without thyrotoxic crisis or storm: Secondary | ICD-10-CM | POA: Diagnosis not present

## 2017-01-01 DIAGNOSIS — Z6835 Body mass index (BMI) 35.0-35.9, adult: Secondary | ICD-10-CM | POA: Diagnosis not present

## 2017-01-01 DIAGNOSIS — R5382 Chronic fatigue, unspecified: Secondary | ICD-10-CM | POA: Diagnosis not present

## 2017-01-15 DIAGNOSIS — H02204 Unspecified lagophthalmos left upper eyelid: Secondary | ICD-10-CM | POA: Diagnosis not present

## 2017-01-15 DIAGNOSIS — H02201 Unspecified lagophthalmos right upper eyelid: Secondary | ICD-10-CM | POA: Diagnosis not present

## 2017-01-15 DIAGNOSIS — H16143 Punctate keratitis, bilateral: Secondary | ICD-10-CM | POA: Diagnosis not present

## 2017-01-15 DIAGNOSIS — H04123 Dry eye syndrome of bilateral lacrimal glands: Secondary | ICD-10-CM | POA: Diagnosis not present

## 2017-01-26 DIAGNOSIS — H02205 Unspecified lagophthalmos left lower eyelid: Secondary | ICD-10-CM | POA: Diagnosis not present

## 2017-01-26 DIAGNOSIS — H40013 Open angle with borderline findings, low risk, bilateral: Secondary | ICD-10-CM | POA: Diagnosis not present

## 2017-01-26 DIAGNOSIS — H02204 Unspecified lagophthalmos left upper eyelid: Secondary | ICD-10-CM | POA: Diagnosis not present

## 2017-01-26 DIAGNOSIS — H02202 Unspecified lagophthalmos right lower eyelid: Secondary | ICD-10-CM | POA: Diagnosis not present

## 2017-01-26 DIAGNOSIS — H02201 Unspecified lagophthalmos right upper eyelid: Secondary | ICD-10-CM | POA: Diagnosis not present

## 2017-01-27 DIAGNOSIS — I10 Essential (primary) hypertension: Secondary | ICD-10-CM | POA: Diagnosis not present

## 2017-01-27 DIAGNOSIS — E876 Hypokalemia: Secondary | ICD-10-CM | POA: Diagnosis not present

## 2017-02-05 DIAGNOSIS — L820 Inflamed seborrheic keratosis: Secondary | ICD-10-CM | POA: Diagnosis not present

## 2017-03-08 DIAGNOSIS — M2170 Unequal limb length (acquired), unspecified site: Secondary | ICD-10-CM | POA: Diagnosis not present

## 2017-03-29 DIAGNOSIS — Z1231 Encounter for screening mammogram for malignant neoplasm of breast: Secondary | ICD-10-CM | POA: Diagnosis not present

## 2017-04-26 ENCOUNTER — Encounter (HOSPITAL_COMMUNITY): Payer: Self-pay

## 2017-04-26 ENCOUNTER — Emergency Department (HOSPITAL_COMMUNITY): Payer: No Typology Code available for payment source

## 2017-04-26 ENCOUNTER — Inpatient Hospital Stay (HOSPITAL_COMMUNITY)
Admission: EM | Admit: 2017-04-26 | Discharge: 2017-04-30 | DRG: 472 | Disposition: A | Discharge status: Rehabilitation Facility | Payer: No Typology Code available for payment source | Attending: General Surgery | Admitting: General Surgery

## 2017-04-26 ENCOUNTER — Inpatient Hospital Stay (HOSPITAL_COMMUNITY): Payer: No Typology Code available for payment source

## 2017-04-26 DIAGNOSIS — E785 Hyperlipidemia, unspecified: Secondary | ICD-10-CM | POA: Diagnosis present

## 2017-04-26 DIAGNOSIS — Z79899 Other long term (current) drug therapy: Secondary | ICD-10-CM

## 2017-04-26 DIAGNOSIS — E871 Hypo-osmolality and hyponatremia: Secondary | ICD-10-CM | POA: Diagnosis not present

## 2017-04-26 DIAGNOSIS — S2232XD Fracture of one rib, left side, subsequent encounter for fracture with routine healing: Secondary | ICD-10-CM | POA: Diagnosis not present

## 2017-04-26 DIAGNOSIS — Z7982 Long term (current) use of aspirin: Secondary | ICD-10-CM

## 2017-04-26 DIAGNOSIS — E559 Vitamin D deficiency, unspecified: Secondary | ICD-10-CM | POA: Diagnosis present

## 2017-04-26 DIAGNOSIS — M25511 Pain in right shoulder: Secondary | ICD-10-CM | POA: Diagnosis not present

## 2017-04-26 DIAGNOSIS — S2231XA Fracture of one rib, right side, initial encounter for closed fracture: Secondary | ICD-10-CM | POA: Diagnosis not present

## 2017-04-26 DIAGNOSIS — R05 Cough: Secondary | ICD-10-CM | POA: Diagnosis not present

## 2017-04-26 DIAGNOSIS — Y9389 Activity, other specified: Secondary | ICD-10-CM

## 2017-04-26 DIAGNOSIS — E669 Obesity, unspecified: Secondary | ICD-10-CM | POA: Diagnosis present

## 2017-04-26 DIAGNOSIS — M25562 Pain in left knee: Secondary | ICD-10-CM | POA: Diagnosis present

## 2017-04-26 DIAGNOSIS — S12490A Other displaced fracture of fifth cervical vertebra, initial encounter for closed fracture: Secondary | ICD-10-CM | POA: Diagnosis present

## 2017-04-26 DIAGNOSIS — S299XXA Unspecified injury of thorax, initial encounter: Secondary | ICD-10-CM | POA: Diagnosis not present

## 2017-04-26 DIAGNOSIS — Y92410 Unspecified street and highway as the place of occurrence of the external cause: Secondary | ICD-10-CM

## 2017-04-26 DIAGNOSIS — S12300A Unspecified displaced fracture of fourth cervical vertebra, initial encounter for closed fracture: Secondary | ICD-10-CM | POA: Diagnosis not present

## 2017-04-26 DIAGNOSIS — K5900 Constipation, unspecified: Secondary | ICD-10-CM | POA: Diagnosis not present

## 2017-04-26 DIAGNOSIS — I2510 Atherosclerotic heart disease of native coronary artery without angina pectoris: Secondary | ICD-10-CM | POA: Diagnosis present

## 2017-04-26 DIAGNOSIS — R339 Retention of urine, unspecified: Secondary | ICD-10-CM | POA: Diagnosis not present

## 2017-04-26 DIAGNOSIS — Z471 Aftercare following joint replacement surgery: Secondary | ICD-10-CM | POA: Diagnosis not present

## 2017-04-26 DIAGNOSIS — H409 Unspecified glaucoma: Secondary | ICD-10-CM | POA: Diagnosis present

## 2017-04-26 DIAGNOSIS — Z96641 Presence of right artificial hip joint: Secondary | ICD-10-CM | POA: Diagnosis present

## 2017-04-26 DIAGNOSIS — G8918 Other acute postprocedural pain: Secondary | ICD-10-CM | POA: Diagnosis not present

## 2017-04-26 DIAGNOSIS — Z87891 Personal history of nicotine dependence: Secondary | ICD-10-CM | POA: Diagnosis not present

## 2017-04-26 DIAGNOSIS — Z981 Arthrodesis status: Secondary | ICD-10-CM | POA: Diagnosis not present

## 2017-04-26 DIAGNOSIS — J310 Chronic rhinitis: Secondary | ICD-10-CM | POA: Diagnosis present

## 2017-04-26 DIAGNOSIS — I119 Hypertensive heart disease without heart failure: Secondary | ICD-10-CM | POA: Diagnosis present

## 2017-04-26 DIAGNOSIS — N319 Neuromuscular dysfunction of bladder, unspecified: Secondary | ICD-10-CM | POA: Diagnosis not present

## 2017-04-26 DIAGNOSIS — S12400A Unspecified displaced fracture of fifth cervical vertebra, initial encounter for closed fracture: Secondary | ICD-10-CM | POA: Diagnosis not present

## 2017-04-26 DIAGNOSIS — Z833 Family history of diabetes mellitus: Secondary | ICD-10-CM | POA: Diagnosis not present

## 2017-04-26 DIAGNOSIS — D696 Thrombocytopenia, unspecified: Secondary | ICD-10-CM | POA: Diagnosis not present

## 2017-04-26 DIAGNOSIS — Z96611 Presence of right artificial shoulder joint: Secondary | ICD-10-CM | POA: Diagnosis present

## 2017-04-26 DIAGNOSIS — M542 Cervicalgia: Secondary | ICD-10-CM | POA: Diagnosis not present

## 2017-04-26 DIAGNOSIS — M4322 Fusion of spine, cervical region: Secondary | ICD-10-CM | POA: Diagnosis not present

## 2017-04-26 DIAGNOSIS — M791 Myalgia: Secondary | ICD-10-CM | POA: Diagnosis not present

## 2017-04-26 DIAGNOSIS — S2239XA Fracture of one rib, unspecified side, initial encounter for closed fracture: Secondary | ICD-10-CM

## 2017-04-26 DIAGNOSIS — S2232XA Fracture of one rib, left side, initial encounter for closed fracture: Secondary | ICD-10-CM | POA: Diagnosis not present

## 2017-04-26 DIAGNOSIS — K219 Gastro-esophageal reflux disease without esophagitis: Secondary | ICD-10-CM | POA: Diagnosis present

## 2017-04-26 DIAGNOSIS — K449 Diaphragmatic hernia without obstruction or gangrene: Secondary | ICD-10-CM | POA: Diagnosis present

## 2017-04-26 DIAGNOSIS — Z9049 Acquired absence of other specified parts of digestive tract: Secondary | ICD-10-CM | POA: Diagnosis not present

## 2017-04-26 DIAGNOSIS — S2220XA Unspecified fracture of sternum, initial encounter for closed fracture: Secondary | ICD-10-CM | POA: Diagnosis not present

## 2017-04-26 DIAGNOSIS — E039 Hypothyroidism, unspecified: Secondary | ICD-10-CM | POA: Diagnosis not present

## 2017-04-26 DIAGNOSIS — I517 Cardiomegaly: Secondary | ICD-10-CM | POA: Diagnosis present

## 2017-04-26 DIAGNOSIS — Z8249 Family history of ischemic heart disease and other diseases of the circulatory system: Secondary | ICD-10-CM | POA: Diagnosis not present

## 2017-04-26 DIAGNOSIS — S199XXA Unspecified injury of neck, initial encounter: Secondary | ICD-10-CM | POA: Diagnosis not present

## 2017-04-26 DIAGNOSIS — A499 Bacterial infection, unspecified: Secondary | ICD-10-CM | POA: Diagnosis not present

## 2017-04-26 DIAGNOSIS — N390 Urinary tract infection, site not specified: Secondary | ICD-10-CM | POA: Diagnosis not present

## 2017-04-26 DIAGNOSIS — R0789 Other chest pain: Secondary | ICD-10-CM

## 2017-04-26 DIAGNOSIS — S13161A Dislocation of C5/C6 cervical vertebrae, initial encounter: Secondary | ICD-10-CM | POA: Diagnosis not present

## 2017-04-26 DIAGNOSIS — S2221XA Fracture of manubrium, initial encounter for closed fracture: Secondary | ICD-10-CM | POA: Diagnosis not present

## 2017-04-26 DIAGNOSIS — M1991 Primary osteoarthritis, unspecified site: Secondary | ICD-10-CM | POA: Diagnosis present

## 2017-04-26 DIAGNOSIS — S0990XA Unspecified injury of head, initial encounter: Secondary | ICD-10-CM | POA: Diagnosis not present

## 2017-04-26 DIAGNOSIS — S129XXA Fracture of neck, unspecified, initial encounter: Secondary | ICD-10-CM | POA: Diagnosis present

## 2017-04-26 DIAGNOSIS — S2222XS Fracture of body of sternum, sequela: Secondary | ICD-10-CM | POA: Diagnosis not present

## 2017-04-26 DIAGNOSIS — B9689 Other specified bacterial agents as the cause of diseases classified elsewhere: Secondary | ICD-10-CM | POA: Diagnosis not present

## 2017-04-26 DIAGNOSIS — Z419 Encounter for procedure for purposes other than remedying health state, unspecified: Secondary | ICD-10-CM

## 2017-04-26 DIAGNOSIS — S13101A Dislocation of unspecified cervical vertebrae, initial encounter: Secondary | ICD-10-CM | POA: Diagnosis not present

## 2017-04-26 DIAGNOSIS — I10 Essential (primary) hypertension: Secondary | ICD-10-CM | POA: Diagnosis not present

## 2017-04-26 DIAGNOSIS — S2220XS Unspecified fracture of sternum, sequela: Secondary | ICD-10-CM | POA: Diagnosis not present

## 2017-04-26 DIAGNOSIS — S12500A Unspecified displaced fracture of sixth cervical vertebra, initial encounter for closed fracture: Secondary | ICD-10-CM | POA: Diagnosis not present

## 2017-04-26 DIAGNOSIS — Z96653 Presence of artificial knee joint, bilateral: Secondary | ICD-10-CM | POA: Diagnosis present

## 2017-04-26 DIAGNOSIS — S2220XD Unspecified fracture of sternum, subsequent encounter for fracture with routine healing: Secondary | ICD-10-CM | POA: Diagnosis not present

## 2017-04-26 DIAGNOSIS — R269 Unspecified abnormalities of gait and mobility: Secondary | ICD-10-CM | POA: Diagnosis not present

## 2017-04-26 DIAGNOSIS — S8992XA Unspecified injury of left lower leg, initial encounter: Secondary | ICD-10-CM | POA: Diagnosis not present

## 2017-04-26 HISTORY — DX: Migraine with aura, not intractable, without status migrainosus: G43.109

## 2017-04-26 LAB — COMPREHENSIVE METABOLIC PANEL
ALBUMIN: 4 g/dL (ref 3.5–5.0)
ALK PHOS: 84 U/L (ref 38–126)
ALT: 23 U/L (ref 14–54)
ANION GAP: 9 (ref 5–15)
AST: 33 U/L (ref 15–41)
BILIRUBIN TOTAL: 0.6 mg/dL (ref 0.3–1.2)
BUN: 17 mg/dL (ref 6–20)
CALCIUM: 9.1 mg/dL (ref 8.9–10.3)
CO2: 25 mmol/L (ref 22–32)
Chloride: 99 mmol/L — ABNORMAL LOW (ref 101–111)
Creatinine, Ser: 0.77 mg/dL (ref 0.44–1.00)
GFR calc Af Amer: >60 mL/min (ref >60)
GLUCOSE: 145 mg/dL — AB (ref 65–99)
Potassium: 4.3 mmol/L (ref 3.5–5.1)
Sodium: 133 mmol/L — ABNORMAL LOW (ref 135–145)
TOTAL PROTEIN: 6.2 g/dL — AB (ref 6.5–8.1)

## 2017-04-26 LAB — CBC
HCT: 39.5 % (ref 36.0–46.0)
Hemoglobin: 13.2 g/dL (ref 12.0–15.0)
MCH: 31.5 pg (ref 26.0–34.0)
MCHC: 33.4 g/dL (ref 30.0–36.0)
MCV: 94.3 fL (ref 78.0–100.0)
Platelets: 205 10*3/uL (ref 150–400)
RBC: 4.19 MIL/uL (ref 3.87–5.11)
RDW: 12.8 % (ref 11.5–15.5)
WBC: 7.4 10*3/uL (ref 4.0–10.5)

## 2017-04-26 LAB — I-STAT CHEM 8, ED
BUN: 18 mg/dL (ref 6–20)
CALCIUM ION: 1.16 mmol/L (ref 1.15–1.40)
CHLORIDE: 98 mmol/L — AB (ref 101–111)
CREATININE: 0.7 mg/dL (ref 0.44–1.00)
Glucose, Bld: 140 mg/dL — ABNORMAL HIGH (ref 65–99)
HCT: 42 % (ref 36.0–46.0)
Hemoglobin: 14.3 g/dL (ref 12.0–15.0)
Potassium: 4.1 mmol/L (ref 3.5–5.1)
SODIUM: 134 mmol/L — AB (ref 135–145)
TCO2: 25 mmol/L (ref 0–100)

## 2017-04-26 LAB — URINALYSIS, ROUTINE W REFLEX MICROSCOPIC
BILIRUBIN URINE: NEGATIVE
Glucose, UA: NEGATIVE mg/dL
HGB URINE DIPSTICK: NEGATIVE
KETONES UR: NEGATIVE mg/dL
Leukocytes, UA: NEGATIVE
Nitrite: NEGATIVE
PROTEIN: NEGATIVE mg/dL
Specific Gravity, Urine: 1.025 (ref 1.005–1.030)
pH: 7 (ref 5.0–8.0)

## 2017-04-26 LAB — ETHANOL

## 2017-04-26 LAB — SAMPLE TO BLOOD BANK

## 2017-04-26 LAB — PROTIME-INR
INR: 0.93
PROTHROMBIN TIME: 12.4 s (ref 11.4–15.2)

## 2017-04-26 LAB — I-STAT TROPONIN, ED: Troponin i, poc: 0 ng/mL (ref 0.00–0.08)

## 2017-04-26 LAB — CDS SEROLOGY

## 2017-04-26 MED ORDER — LOSARTAN POTASSIUM 50 MG PO TABS
50.0000 mg | ORAL_TABLET | Freq: Every day | ORAL | Status: DC
  Administered 2017-04-28 – 2017-04-30 (×3): 50 mg via ORAL
  Filled 2017-04-26 (×3): qty 1

## 2017-04-26 MED ORDER — MORPHINE SULFATE (PF) 4 MG/ML IV SOLN
2.0000 mg | Freq: Once | INTRAVENOUS | Status: AC
  Administered 2017-04-26: 2 mg via INTRAVENOUS
  Filled 2017-04-26: qty 1

## 2017-04-26 MED ORDER — OXYCODONE HCL 5 MG PO TABS: 5.0000 mg | ORAL_TABLET | ORAL | Status: DC | PRN

## 2017-04-26 MED ORDER — BRIMONIDINE TARTRATE 0.2 % OP SOLN
1.0000 [drp] | Freq: Two times a day (BID) | OPHTHALMIC | Status: DC
  Administered 2017-04-26 – 2017-04-30 (×7): 1 [drp] via OPHTHALMIC
  Filled 2017-04-26 (×4): qty 5

## 2017-04-26 MED ORDER — SODIUM CHLORIDE 0.9 % IV SOLN
INTRAVENOUS | Status: DC
  Administered 2017-04-26 – 2017-04-28 (×3): via INTRAVENOUS

## 2017-04-26 MED ORDER — PROCHLORPERAZINE EDISYLATE 5 MG/ML IJ SOLN: 5.0000 mg | Freq: Four times a day (QID) | INTRAMUSCULAR | Status: DC | PRN

## 2017-04-26 MED ORDER — ONDANSETRON HCL 4 MG/2ML IJ SOLN: 4.0000 mg | Freq: Four times a day (QID) | INTRAMUSCULAR | Status: DC | PRN

## 2017-04-26 MED ORDER — VITAMIN B-12 1000 MCG PO TABS
2500.0000 ug | ORAL_TABLET | Freq: Every day | ORAL | Status: DC
  Administered 2017-04-28 – 2017-04-30 (×3): 2500 ug via ORAL
  Filled 2017-04-26 (×3): qty 3

## 2017-04-26 MED ORDER — PROCHLORPERAZINE MALEATE 10 MG PO TABS
10.0000 mg | ORAL_TABLET | Freq: Four times a day (QID) | ORAL | Status: DC | PRN
  Filled 2017-04-26: qty 1

## 2017-04-26 MED ORDER — ADULT MULTIVITAMIN W/MINERALS CH
1.0000 | ORAL_TABLET | Freq: Every day | ORAL | Status: DC
  Administered 2017-04-26 – 2017-04-30 (×4): 1 via ORAL
  Filled 2017-04-26 (×4): qty 1

## 2017-04-26 MED ORDER — ASPIRIN 325 MG PO TABS
325.0000 mg | ORAL_TABLET | Freq: Every day | ORAL | Status: DC
  Administered 2017-04-26 – 2017-04-30 (×4): 325 mg via ORAL
  Filled 2017-04-26 (×4): qty 1

## 2017-04-26 MED ORDER — MORPHINE SULFATE (PF) 4 MG/ML IV SOLN
4.0000 mg | Freq: Once | INTRAVENOUS | Status: AC
  Administered 2017-04-26: 4 mg via INTRAVENOUS
  Filled 2017-04-26: qty 1

## 2017-04-26 MED ORDER — ONDANSETRON HCL 4 MG/2ML IJ SOLN
4.0000 mg | Freq: Once | INTRAMUSCULAR | Status: AC
  Administered 2017-04-26: 4 mg via INTRAVENOUS
  Filled 2017-04-26: qty 2

## 2017-04-26 MED ORDER — ACETAMINOPHEN 325 MG PO TABS: 650.0000 mg | ORAL_TABLET | ORAL | Status: DC | PRN

## 2017-04-26 MED ORDER — CEFAZOLIN SODIUM-DEXTROSE 2-4 GM/100ML-% IV SOLN
2.0000 g | INTRAVENOUS | Status: AC
  Administered 2017-04-27: 2 g via INTRAVENOUS
  Filled 2017-04-26 (×2): qty 100

## 2017-04-26 MED ORDER — CYANOCOBALAMIN 2500 MCG SL SUBL: 2500.0000 ug | SUBLINGUAL_TABLET | Freq: Every day | SUBLINGUAL | Status: DC

## 2017-04-26 MED ORDER — ONDANSETRON 4 MG PO TBDP: 4.0000 mg | ORAL_TABLET | Freq: Four times a day (QID) | ORAL | Status: DC | PRN

## 2017-04-26 MED ORDER — SODIUM CHLORIDE 0.9 % IV BOLUS (SEPSIS)
1000.0000 mL | Freq: Once | INTRAVENOUS | Status: AC
  Administered 2017-04-26: 1000 mL via INTRAVENOUS

## 2017-04-26 MED ORDER — MORPHINE SULFATE (PF) 4 MG/ML IV SOLN
2.0000 mg | INTRAVENOUS | Status: DC | PRN
  Administered 2017-04-26 – 2017-04-27 (×2): 4 mg via INTRAVENOUS
  Administered 2017-04-27 (×2): 2 mg via INTRAVENOUS
  Administered 2017-04-27: 4 mg via INTRAVENOUS
  Filled 2017-04-26 (×5): qty 1

## 2017-04-26 MED ORDER — PANTOPRAZOLE SODIUM 40 MG PO TBEC
40.0000 mg | DELAYED_RELEASE_TABLET | Freq: Every day | ORAL | Status: DC
  Administered 2017-04-28 – 2017-04-30 (×3): 40 mg via ORAL
  Filled 2017-04-26 (×3): qty 1

## 2017-04-26 MED ORDER — DIAZEPAM 5 MG PO TABS
5.0000 mg | ORAL_TABLET | Freq: Once | ORAL | Status: AC
  Administered 2017-04-26: 5 mg via ORAL
  Filled 2017-04-26: qty 1

## 2017-04-26 MED ORDER — IOPAMIDOL (ISOVUE-370) INJECTION 76%
INTRAVENOUS | Status: AC
  Administered 2017-04-26: 100 mL via INTRAVENOUS
  Filled 2017-04-26: qty 100

## 2017-04-26 MED ORDER — LEVOTHYROXINE SODIUM 100 MCG PO TABS
100.0000 ug | ORAL_TABLET | Freq: Every day | ORAL | Status: DC
  Administered 2017-04-28 – 2017-04-30 (×3): 100 ug via ORAL
  Filled 2017-04-26 (×4): qty 1
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
## 2017-04-26 NOTE — ED Notes (Signed)
Patient transported to MRI 
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
## 2017-04-26 NOTE — ED Notes (Signed)
Patient transported to X-ray 

## 2017-04-26 NOTE — ED Notes (Addendum)
Pt's facility [facility] notified about pt's status. Pt's daughter aware as well.

## 2017-04-26 NOTE — ED Notes (Addendum)
Bedside report given after pt finished with MRI. Attempted to call report 2x. Belongings sent with pt to floor.
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
## 2017-04-26 NOTE — Progress Notes (Signed)
Received patient from ED with aspen collar on.  Patient AOx4, VS stable with slightly elevated BP at 172/77, O2Sat at 97% on RA, pain at 2/10.  Patient oriented to room, bed controls and call light, given cold water to drink.  Will continue to monitor.

## 2017-04-27 ENCOUNTER — Inpatient Hospital Stay (HOSPITAL_COMMUNITY): Payer: No Typology Code available for payment source | Admitting: Certified Registered"

## 2017-04-27 ENCOUNTER — Inpatient Hospital Stay (HOSPITAL_COMMUNITY): Payer: No Typology Code available for payment source

## 2017-04-27 ENCOUNTER — Encounter (HOSPITAL_COMMUNITY): Admission: EM | Disposition: A | Discharge status: Rehabilitation Facility | Payer: Self-pay | Source: Home / Self Care

## 2017-04-27 ENCOUNTER — Encounter (HOSPITAL_COMMUNITY): Payer: Self-pay | Admitting: *Deleted

## 2017-04-27 HISTORY — PX: POSTERIOR CERVICAL FUSION/FORAMINOTOMY: SHX5038

## 2017-04-27 LAB — GLUCOSE, CAPILLARY: Glucose-Capillary: 114 mg/dL — ABNORMAL HIGH (ref 65–99)

## 2017-04-27 LAB — CBC
HEMATOCRIT: 37.8 % (ref 36.0–46.0)
HEMOGLOBIN: 12.8 g/dL (ref 12.0–15.0)
MCH: 31.8 pg (ref 26.0–34.0)
MCHC: 33.9 g/dL (ref 30.0–36.0)
MCV: 93.8 fL (ref 78.0–100.0)
Platelets: 186 10*3/uL (ref 150–400)
RBC: 4.03 MIL/uL (ref 3.87–5.11)
RDW: 12.7 % (ref 11.5–15.5)
WBC: 6.6 10*3/uL (ref 4.0–10.5)

## 2017-04-27 LAB — TYPE AND SCREEN
ABO/RH(D): O POS
Antibody Screen: NEGATIVE

## 2017-04-27 LAB — BASIC METABOLIC PANEL
Anion gap: 7 (ref 5–15)
BUN: 8 mg/dL (ref 6–20)
CHLORIDE: 100 mmol/L — AB (ref 101–111)
CO2: 26 mmol/L (ref 22–32)
CREATININE: 0.64 mg/dL (ref 0.44–1.00)
Calcium: 8.3 mg/dL — ABNORMAL LOW (ref 8.9–10.3)
GFR calc Af Amer: >60 mL/min (ref >60)
GFR calc non Af Amer: >60 mL/min (ref >60)
GLUCOSE: 122 mg/dL — AB (ref 65–99)
POTASSIUM: 3.9 mmol/L (ref 3.5–5.1)
Sodium: 133 mmol/L — ABNORMAL LOW (ref 135–145)

## 2017-04-27 LAB — SURGICAL PCR SCREEN
MRSA, PCR: NEGATIVE
STAPHYLOCOCCUS AUREUS: NEGATIVE

## 2017-04-27 SURGERY — POSTERIOR CERVICAL FUSION/FORAMINOTOMY LEVEL 4
Anesthesia: General | Site: Neck

## 2017-04-27 MED ORDER — LIDOCAINE-EPINEPHRINE 2 %-1:100000 IJ SOLN
INTRAMUSCULAR | Status: DC | PRN
  Administered 2017-04-27: 10 mL

## 2017-04-27 MED ORDER — HEMOSTATIC AGENTS (NO CHARGE) OPTIME
TOPICAL | Status: DC | PRN
  Administered 2017-04-27: 1 via TOPICAL

## 2017-04-27 MED ORDER — ACETAMINOPHEN 650 MG RE SUPP: 650.0000 mg | RECTAL | Status: DC | PRN

## 2017-04-27 MED ORDER — PHENOL 1.4 % MT LIQD: 1.0000 | OROMUCOSAL | Status: DC | PRN

## 2017-04-27 MED ORDER — PROPOFOL 10 MG/ML IV BOLUS
INTRAVENOUS | Status: AC
  Filled 2017-04-27: qty 20

## 2017-04-27 MED ORDER — OXYCODONE HCL 5 MG PO TABS: 5.0000 mg | ORAL_TABLET | ORAL | Status: DC | PRN

## 2017-04-27 MED ORDER — DOCUSATE SODIUM 100 MG PO CAPS
100.0000 mg | ORAL_CAPSULE | Freq: Two times a day (BID) | ORAL | Status: DC
  Administered 2017-04-27 – 2017-04-30 (×6): 100 mg via ORAL
  Filled 2017-04-27 (×7): qty 1

## 2017-04-27 MED ORDER — PHENYLEPHRINE 40 MCG/ML (10ML) SYRINGE FOR IV PUSH (FOR BLOOD PRESSURE SUPPORT)
PREFILLED_SYRINGE | INTRAVENOUS | Status: AC
  Filled 2017-04-27: qty 10

## 2017-04-27 MED ORDER — BACITRACIN ZINC 500 UNIT/GM EX OINT
TOPICAL_OINTMENT | CUTANEOUS | Status: AC
  Filled 2017-04-27: qty 28.35

## 2017-04-27 MED ORDER — LORAZEPAM 0.5 MG PO TABS: 0.5000 mg | ORAL_TABLET | Freq: Two times a day (BID) | ORAL | Status: DC | PRN

## 2017-04-27 MED ORDER — BUPIVACAINE LIPOSOME 1.3 % IJ SUSP
20.0000 mL | INTRAMUSCULAR | Status: DC
  Filled 2017-04-27: qty 20

## 2017-04-27 MED ORDER — ROCURONIUM BROMIDE 10 MG/ML (PF) SYRINGE
PREFILLED_SYRINGE | INTRAVENOUS | Status: AC
  Filled 2017-04-27: qty 5

## 2017-04-27 MED ORDER — BUPIVACAINE-EPINEPHRINE 0.5% -1:200000 IJ SOLN
INTRAMUSCULAR | Status: DC | PRN
  Administered 2017-04-27: 10 mL

## 2017-04-27 MED ORDER — BACITRACIN ZINC 500 UNIT/GM EX OINT
TOPICAL_OINTMENT | CUTANEOUS | Status: DC | PRN
  Administered 2017-04-27: 1 via TOPICAL

## 2017-04-27 MED ORDER — SODIUM CHLORIDE 0.9 % IV SOLN: INTRAVENOUS | Status: DC

## 2017-04-27 MED ORDER — ZOLPIDEM TARTRATE 5 MG PO TABS: 5.0000 mg | ORAL_TABLET | Freq: Every evening | ORAL | Status: DC | PRN

## 2017-04-27 MED ORDER — MENTHOL 3 MG MT LOZG: 1.0000 | LOZENGE | OROMUCOSAL | Status: DC | PRN

## 2017-04-27 MED ORDER — SODIUM CHLORIDE BACTERIOSTATIC 0.9 % IJ SOLN
INTRAMUSCULAR | Status: DC | PRN
  Administered 2017-04-27 (×2): 10 mL

## 2017-04-27 MED ORDER — BUPIVACAINE-EPINEPHRINE (PF) 0.5% -1:200000 IJ SOLN
INTRAMUSCULAR | Status: AC
  Filled 2017-04-27: qty 30

## 2017-04-27 MED ORDER — THROMBIN 5000 UNITS EX SOLR
CUTANEOUS | Status: DC | PRN
  Administered 2017-04-27 (×2): 5000 [IU] via TOPICAL

## 2017-04-27 MED ORDER — 0.9 % SODIUM CHLORIDE (POUR BTL) OPTIME
TOPICAL | Status: DC | PRN
  Administered 2017-04-27: 1000 mL

## 2017-04-27 MED ORDER — OXYCODONE HCL ER 10 MG PO T12A
10.0000 mg | EXTENDED_RELEASE_TABLET | Freq: Two times a day (BID) | ORAL | Status: DC
  Administered 2017-04-27 – 2017-04-29 (×5): 10 mg via ORAL
  Filled 2017-04-27 (×5): qty 1

## 2017-04-27 MED ORDER — SODIUM CHLORIDE 0.9% FLUSH
3.0000 mL | Freq: Two times a day (BID) | INTRAVENOUS | Status: DC
  Administered 2017-04-28: 3 mL via INTRAVENOUS

## 2017-04-27 MED ORDER — SODIUM CHLORIDE 0.9 % IV SOLN
INTRAVENOUS | Status: DC | PRN
  Administered 2017-04-27: 20 mL

## 2017-04-27 MED ORDER — LACTATED RINGERS IV SOLN
INTRAVENOUS | Status: DC
  Administered 2017-04-27: 13:00:00 via INTRAVENOUS

## 2017-04-27 MED ORDER — EPHEDRINE 5 MG/ML INJ
INTRAVENOUS | Status: AC
  Filled 2017-04-27: qty 10

## 2017-04-27 MED ORDER — LIDOCAINE 2% (20 MG/ML) 5 ML SYRINGE
INTRAMUSCULAR | Status: AC
  Filled 2017-04-27: qty 10

## 2017-04-27 MED ORDER — ROCURONIUM BROMIDE 100 MG/10ML IV SOLN
INTRAVENOUS | Status: DC | PRN
  Administered 2017-04-27: 20 mg via INTRAVENOUS
  Administered 2017-04-27: 30 mg via INTRAVENOUS
  Administered 2017-04-27: 50 mg via INTRAVENOUS

## 2017-04-27 MED ORDER — VANCOMYCIN HCL POWD
Status: DC | PRN
  Administered 2017-04-27: 1000 mg

## 2017-04-27 MED ORDER — ARTIFICIAL TEARS OPHTHALMIC OINT
TOPICAL_OINTMENT | OPHTHALMIC | Status: AC
  Filled 2017-04-27: qty 3.5

## 2017-04-27 MED ORDER — LIDOCAINE HCL (CARDIAC) 20 MG/ML IV SOLN
INTRAVENOUS | Status: DC | PRN
  Administered 2017-04-27: 50 mg via INTRATRACHEAL

## 2017-04-27 MED ORDER — DEXAMETHASONE SODIUM PHOSPHATE 10 MG/ML IJ SOLN
INTRAMUSCULAR | Status: DC | PRN
  Administered 2017-04-27: 10 mg via INTRAVENOUS

## 2017-04-27 MED ORDER — VANCOMYCIN HCL 1000 MG IV SOLR
INTRAVENOUS | Status: AC
  Filled 2017-04-27: qty 1000

## 2017-04-27 MED ORDER — SODIUM CHLORIDE 0.9% FLUSH: 3.0000 mL | INTRAVENOUS | Status: DC | PRN

## 2017-04-27 MED ORDER — FENTANYL CITRATE (PF) 100 MCG/2ML IJ SOLN: 25.0000 ug | INTRAMUSCULAR | Status: DC | PRN

## 2017-04-27 MED ORDER — SUCCINYLCHOLINE CHLORIDE 200 MG/10ML IV SOSY
PREFILLED_SYRINGE | INTRAVENOUS | Status: AC
  Filled 2017-04-27: qty 10

## 2017-04-27 MED ORDER — CEFAZOLIN SODIUM-DEXTROSE 2-4 GM/100ML-% IV SOLN
2.0000 g | Freq: Three times a day (TID) | INTRAVENOUS | Status: AC
  Administered 2017-04-27 – 2017-04-28 (×2): 2 g via INTRAVENOUS
  Filled 2017-04-27 (×2): qty 100

## 2017-04-27 MED ORDER — SENNA 8.6 MG PO TABS
1.0000 | ORAL_TABLET | Freq: Two times a day (BID) | ORAL | Status: DC
  Administered 2017-04-27 – 2017-04-28 (×3): 8.6 mg via ORAL
  Filled 2017-04-27 (×3): qty 1

## 2017-04-27 MED ORDER — METHOCARBAMOL 500 MG PO TABS
500.0000 mg | ORAL_TABLET | Freq: Four times a day (QID) | ORAL | Status: DC | PRN
  Administered 2017-04-28 – 2017-04-29 (×4): 500 mg via ORAL
  Filled 2017-04-27 (×4): qty 1

## 2017-04-27 MED ORDER — ACETAMINOPHEN 325 MG PO TABS: 650.0000 mg | ORAL_TABLET | ORAL | Status: DC | PRN

## 2017-04-27 MED ORDER — GABAPENTIN 300 MG PO CAPS
300.0000 mg | ORAL_CAPSULE | Freq: Three times a day (TID) | ORAL | Status: DC
  Administered 2017-04-27 – 2017-04-30 (×8): 300 mg via ORAL
  Filled 2017-04-27 (×8): qty 1

## 2017-04-27 MED ORDER — PROPOFOL 10 MG/ML IV BOLUS
INTRAVENOUS | Status: DC | PRN
  Administered 2017-04-27: 130 mg via INTRAVENOUS

## 2017-04-27 MED ORDER — FENTANYL CITRATE (PF) 250 MCG/5ML IJ SOLN
INTRAMUSCULAR | Status: AC
  Filled 2017-04-27: qty 5

## 2017-04-27 MED ORDER — LORAZEPAM 0.5 MG PO TABS
0.5000 mg | ORAL_TABLET | Freq: Once | ORAL | Status: AC
  Administered 2017-04-27: 0.5 mg via ORAL
  Filled 2017-04-27: qty 1

## 2017-04-27 MED ORDER — THROMBIN 5000 UNITS EX SOLR
OROMUCOSAL | Status: DC | PRN
  Administered 2017-04-27: 5 mL via TOPICAL

## 2017-04-27 MED ORDER — FLEET ENEMA 7-19 GM/118ML RE ENEM: 1.0000 | ENEMA | Freq: Once | RECTAL | Status: DC | PRN

## 2017-04-27 MED ORDER — ONDANSETRON HCL 4 MG/2ML IJ SOLN
INTRAMUSCULAR | Status: AC
  Filled 2017-04-27: qty 6

## 2017-04-27 MED ORDER — ONDANSETRON HCL 4 MG/2ML IJ SOLN
INTRAMUSCULAR | Status: DC | PRN
  Administered 2017-04-27: 4 mg via INTRAVENOUS

## 2017-04-27 MED ORDER — ONDANSETRON HCL 4 MG/2ML IJ SOLN
INTRAMUSCULAR | Status: AC
  Filled 2017-04-27: qty 2

## 2017-04-27 MED ORDER — METHOCARBAMOL 1000 MG/10ML IJ SOLN
500.0000 mg | Freq: Four times a day (QID) | INTRAVENOUS | Status: DC | PRN
  Filled 2017-04-27: qty 5

## 2017-04-27 MED ORDER — SENNOSIDES-DOCUSATE SODIUM 8.6-50 MG PO TABS: 1.0000 | ORAL_TABLET | Freq: Every evening | ORAL | Status: DC | PRN

## 2017-04-27 MED ORDER — ONDANSETRON HCL 4 MG/2ML IJ SOLN: 4.0000 mg | Freq: Four times a day (QID) | INTRAMUSCULAR | Status: DC | PRN

## 2017-04-27 MED ORDER — SODIUM CHLORIDE 0.9 % IJ SOLN
INTRAMUSCULAR | Status: AC
  Filled 2017-04-27: qty 10

## 2017-04-27 MED ORDER — BISACODYL 10 MG RE SUPP: 10.0000 mg | Freq: Every day | RECTAL | Status: DC | PRN

## 2017-04-27 MED ORDER — DEXAMETHASONE SODIUM PHOSPHATE 10 MG/ML IJ SOLN
INTRAMUSCULAR | Status: AC
  Filled 2017-04-27: qty 1

## 2017-04-27 MED ORDER — DEXTROSE 5 % IV SOLN
INTRAVENOUS | Status: DC | PRN
  Administered 2017-04-27: 25 ug/min via INTRAVENOUS

## 2017-04-27 MED ORDER — HYDROMORPHONE HCL 1 MG/ML IJ SOLN: 1.0000 mg | INTRAMUSCULAR | Status: DC | PRN

## 2017-04-27 MED ORDER — ROCURONIUM BROMIDE 10 MG/ML (PF) SYRINGE
PREFILLED_SYRINGE | INTRAVENOUS | Status: AC
  Filled 2017-04-27: qty 10

## 2017-04-27 MED ORDER — ACETAMINOPHEN 325 MG PO TABS
650.0000 mg | ORAL_TABLET | ORAL | Status: DC | PRN
  Administered 2017-04-29 – 2017-04-30 (×3): 650 mg via ORAL
  Filled 2017-04-27 (×4): qty 2

## 2017-04-27 MED ORDER — SUGAMMADEX SODIUM 500 MG/5ML IV SOLN
INTRAVENOUS | Status: AC
  Filled 2017-04-27: qty 5

## 2017-04-27 MED ORDER — SODIUM CHLORIDE 0.9 % IV SOLN: 250.0000 mL | INTRAVENOUS | Status: DC

## 2017-04-27 MED ORDER — ONDANSETRON HCL 4 MG PO TABS: 4.0000 mg | ORAL_TABLET | Freq: Four times a day (QID) | ORAL | Status: DC | PRN

## 2017-04-27 MED ORDER — LIDOCAINE 2% (20 MG/ML) 5 ML SYRINGE
INTRAMUSCULAR | Status: AC
  Filled 2017-04-27: qty 5

## 2017-04-27 MED ORDER — SODIUM CHLORIDE 0.9 % IR SOLN
Status: DC | PRN
  Administered 2017-04-27: 500 mL

## 2017-04-27 MED ORDER — LIDOCAINE-EPINEPHRINE 2 %-1:100000 IJ SOLN
INTRAMUSCULAR | Status: AC
  Filled 2017-04-27: qty 1

## 2017-04-27 MED ORDER — SUGAMMADEX SODIUM 500 MG/5ML IV SOLN
INTRAVENOUS | Status: DC | PRN
  Administered 2017-04-27: 300 mg via INTRAVENOUS

## 2017-04-27 MED ORDER — THROMBIN 5000 UNITS EX SOLR
CUTANEOUS | Status: AC
  Filled 2017-04-27: qty 20000

## 2017-04-27 MED ORDER — HYDRALAZINE HCL 20 MG/ML IJ SOLN
5.0000 mg | Freq: Four times a day (QID) | INTRAMUSCULAR | Status: DC | PRN
  Administered 2017-04-27: 5 mg via INTRAVENOUS
  Filled 2017-04-27: qty 1

## 2017-04-27 MED ORDER — FENTANYL CITRATE (PF) 250 MCG/5ML IJ SOLN
INTRAMUSCULAR | Status: DC | PRN
  Administered 2017-04-27: 75 ug via INTRAVENOUS
  Administered 2017-04-27: 25 ug via INTRAVENOUS
  Administered 2017-04-27: 100 ug via INTRAVENOUS
  Administered 2017-04-27: 50 ug via INTRAVENOUS

## 2017-04-27 MED ORDER — ACETAMINOPHEN 500 MG PO TABS
1000.0000 mg | ORAL_TABLET | Freq: Four times a day (QID) | ORAL | Status: AC
  Administered 2017-04-27 – 2017-04-28 (×4): 1000 mg via ORAL
  Filled 2017-04-27 (×4): qty 2

## 2017-04-27 MED ORDER — SUGAMMADEX SODIUM 200 MG/2ML IV SOLN
INTRAVENOUS | Status: AC
  Filled 2017-04-27: qty 4

## 2017-04-27 MED ORDER — DEXAMETHASONE SODIUM PHOSPHATE 10 MG/ML IJ SOLN
INTRAMUSCULAR | Status: AC
  Filled 2017-04-27: qty 2

## 2017-04-27 SURGICAL SUPPLY — 88 items
BAG DECANTER FOR FLEXI CONT (MISCELLANEOUS) ×3 IMPLANT
BENZOIN TINCTURE PRP APPL 2/3 (GAUZE/BANDAGES/DRESSINGS) ×3 IMPLANT
BLADE CLIPPER SURG (BLADE) ×3 IMPLANT
BLADE ULTRA TIP 2M (BLADE) IMPLANT
BONE EQUIVA 5CC (Bone Implant) ×6 IMPLANT
BUR MATCHSTICK NEURO 3.0 LAGG (BURR) ×3 IMPLANT
BUR PRECISION FLUTE 5.0 (BURR) ×3 IMPLANT
CANISTER SUCT 3000ML PPV (MISCELLANEOUS) ×6 IMPLANT
CAP CLSR POST CERV (Cap) ×24 IMPLANT
CARTRIDGE OIL MAESTRO DRILL (MISCELLANEOUS) ×1 IMPLANT
CHLORAPREP W/TINT 26ML (MISCELLANEOUS) ×3 IMPLANT
CLOSURE WOUND 1/2 X4 (GAUZE/BANDAGES/DRESSINGS) ×1
COVER BACK TABLE 60X90IN (DRAPES) ×3 IMPLANT
DECANTER SPIKE VIAL GLASS SM (MISCELLANEOUS) ×3 IMPLANT
DERMABOND ADVANCED (GAUZE/BANDAGES/DRESSINGS) ×4
DERMABOND ADVANCED .7 DNX12 (GAUZE/BANDAGES/DRESSINGS) ×2 IMPLANT
DIFFUSER DRILL AIR PNEUMATIC (MISCELLANEOUS) ×3 IMPLANT
DRAPE C-ARM 42X72 X-RAY (DRAPES) ×3 IMPLANT
DRAPE C-ARMOR (DRAPES) ×3 IMPLANT
DRAPE MICROSCOPE LEICA (MISCELLANEOUS) IMPLANT
DRAPE POUCH INSTRU U-SHP 10X18 (DRAPES) ×3 IMPLANT
DRSG OPSITE POSTOP 4X6 (GAUZE/BANDAGES/DRESSINGS) ×3 IMPLANT
DRSG PAD ABDOMINAL 8X10 ST (GAUZE/BANDAGES/DRESSINGS) IMPLANT
ELECT REM PT RETURN 9FT ADLT (ELECTROSURGICAL) ×3
ELECTRODE REM PT RTRN 9FT ADLT (ELECTROSURGICAL) ×1 IMPLANT
EVACUATOR 1/8 PVC DRAIN (DRAIN) ×3 IMPLANT
GAUZE SPONGE 4X4 12PLY STRL (GAUZE/BANDAGES/DRESSINGS) ×3 IMPLANT
GAUZE SPONGE 4X4 16PLY XRAY LF (GAUZE/BANDAGES/DRESSINGS) IMPLANT
GLOVE BIO SURGEON STRL SZ7 (GLOVE) IMPLANT
GLOVE BIOGEL PI IND STRL 7.0 (GLOVE) IMPLANT
GLOVE BIOGEL PI IND STRL 7.5 (GLOVE) ×1 IMPLANT
GLOVE BIOGEL PI INDICATOR 7.0 (GLOVE)
GLOVE BIOGEL PI INDICATOR 7.5 (GLOVE) ×2
GLOVE SS BIOGEL STRL SZ 7.5 (GLOVE) ×1 IMPLANT
GLOVE SUPERSENSE BIOGEL SZ 7.5 (GLOVE) ×2
GOWN STRL REUS W/ TWL LRG LVL3 (GOWN DISPOSABLE) ×3 IMPLANT
GOWN STRL REUS W/ TWL XL LVL3 (GOWN DISPOSABLE) IMPLANT
GOWN STRL REUS W/TWL 2XL LVL3 (GOWN DISPOSABLE) ×3 IMPLANT
GOWN STRL REUS W/TWL LRG LVL3 (GOWN DISPOSABLE) ×6
GOWN STRL REUS W/TWL XL LVL3 (GOWN DISPOSABLE)
HEMOSTAT POWDER KIT SURGIFOAM (HEMOSTASIS) ×3 IMPLANT
HEMOSTAT SURGICEL 2X14 (HEMOSTASIS) IMPLANT
KIT BASIN OR (CUSTOM PROCEDURE TRAY) ×3 IMPLANT
KIT INFUSE X SMALL 1.4CC (Orthopedic Implant) ×3 IMPLANT
KIT ROOM TURNOVER OR (KITS) ×3 IMPLANT
MARKER SKIN DUAL TIP RULER LAB (MISCELLANEOUS) ×3 IMPLANT
NEEDLE HYPO 18GX1.5 BLUNT FILL (NEEDLE) IMPLANT
NEEDLE HYPO 21X1.5 SAFETY (NEEDLE) ×6 IMPLANT
NEEDLE SPNL 22GX3.5 QUINCKE BK (NEEDLE) ×3 IMPLANT
NS IRRIG 1000ML POUR BTL (IV SOLUTION) ×3 IMPLANT
OIL CARTRIDGE MAESTRO DRILL (MISCELLANEOUS) ×3
PACK LAMINECTOMY NEURO (CUSTOM PROCEDURE TRAY) ×3 IMPLANT
PACK UNIVERSAL I (CUSTOM PROCEDURE TRAY) ×3 IMPLANT
PATTIES SURGICAL .5X1.5 (GAUZE/BANDAGES/DRESSINGS) IMPLANT
PIN MAYFIELD SKULL DISP (PIN) ×3 IMPLANT
ROD VIRAGE 3.5X50MM (Rod) ×6 IMPLANT
RUBBERBAND STERILE (MISCELLANEOUS) IMPLANT
SCREW VIRAGE 3.5X14 (Screw) ×18 IMPLANT
SCREW VIRAGE 3.5X24MM (Screw) ×6 IMPLANT
SEALER BIPOLAR AQUA 2.3 (INSTRUMENTS) ×3 IMPLANT
SPONGE INTESTINAL PEANUT (DISPOSABLE) IMPLANT
SPONGE NEURO XRAY DETECT 1X3 (DISPOSABLE) ×3 IMPLANT
SPONGE SURGIFOAM ABS GEL SZ50 (HEMOSTASIS) ×3 IMPLANT
STAPLER VISISTAT 35W (STAPLE) ×3 IMPLANT
STRIP CLOSURE SKIN 1/2X4 (GAUZE/BANDAGES/DRESSINGS) ×2 IMPLANT
STRIP SURGICAL 1 X 6 IN (GAUZE/BANDAGES/DRESSINGS) IMPLANT
STRIP SURGICAL 1/2 X 6 IN (GAUZE/BANDAGES/DRESSINGS) IMPLANT
STRIP SURGICAL 1/4 X 6 IN (GAUZE/BANDAGES/DRESSINGS) IMPLANT
STRIP SURGICAL 3/4 X 6 IN (GAUZE/BANDAGES/DRESSINGS) IMPLANT
SUT STRATAFIX 1PDS 45CM VIOLET (SUTURE) ×3 IMPLANT
SUT STRATAFIX MNCRL+ 3-0 PS-2 (SUTURE) ×1
SUT STRATAFIX MONOCRYL 3-0 (SUTURE) ×2
SUT STRATAFIX SPIRAL + 2-0 (SUTURE) ×3 IMPLANT
SUT VIC AB 0 CT1 18XCR BRD8 (SUTURE) IMPLANT
SUT VIC AB 0 CT1 8-18 (SUTURE)
SUT VIC AB 2-0 CT1 18 (SUTURE) IMPLANT
SUT VIC AB 3-0 SH 8-18 (SUTURE) ×3 IMPLANT
SUTURE STRATFX MNCRL+ 3-0 PS-2 (SUTURE) ×1 IMPLANT
SYR 20CC LL (SYRINGE) ×3 IMPLANT
SYR 30ML LL (SYRINGE) ×3 IMPLANT
SYR 3ML LL SCALE MARK (SYRINGE) IMPLANT
TOWEL GREEN STERILE (TOWEL DISPOSABLE) ×3 IMPLANT
TOWEL GREEN STERILE FF (TOWEL DISPOSABLE) ×3 IMPLANT
TRAY FOLEY W/METER SILVER 16FR (SET/KITS/TRAYS/PACK) IMPLANT
TUBE CONNECTING 20'X1/4 (TUBING) ×1
TUBE CONNECTING 20X1/4 (TUBING) ×2 IMPLANT
UNDERPAD 30X30 (UNDERPADS AND DIAPERS) ×3 IMPLANT
WATER STERILE IRR 1000ML POUR (IV SOLUTION) ×3 IMPLANT

## 2017-04-27 NOTE — Transfer of Care (Signed)
Immediate Anesthesia Transfer of Care Note  Patient: Catherine Coleman  Procedure(s) Performed: Procedure(s): CERVICAL FIVE-SIX  OPEN REDUCTION OF FRACTURE, CERVICAL THREE-SEVEN  DORSAL FIXATION AND FUSION (N/A)  Patient Location: PACU  Anesthesia Type:General  Level of Consciousness: awake, alert  and oriented  Airway & Oxygen Therapy: Patient Spontanous Breathing and Patient connected to nasal cannula oxygen  Post-op Assessment: Report given to RN, Post -op Vital signs reviewed and stable and Patient moving all extremities X 4  Post vital signs: Reviewed and stable  Last Vitals:  Vitals:   04/27/17 0847 04/27/17 1020  BP: (!) 180/79 (!) 148/82  Pulse: 70   Resp: 19   Temp: 36.8 C   SpO2: 94%     Last Pain:  Vitals:   04/27/17 1153  TempSrc:   PainSc: 8          Complications: No apparent anesthesia complications

## 2017-04-27 NOTE — Brief Op Note (Signed)
04/26/2017 - 04/27/2017  4:37 PM  PATIENT:  Catherine Coleman  83 y.o. female  PRE-OPERATIVE DIAGNOSIS:  C- spine fracture  POST-OPERATIVE DIAGNOSIS:  Cervical spine fracture  PROCEDURE:  Procedure(s): CERVICAL FIVE-SIX  OPEN REDUCTION OF FRACTURE, CERVICAL THREE-SEVEN  DORSAL FIXATION AND FUSION (N/A)  SURGEON:  Surgeon(s) and Role:    * Ditty, Kevan Ny, MD - Primary  PHYSICIAN ASSISTANT: Ferne Reus, PA-C  ANESTHESIA:   general  EBL:  Total I/O In: 1400 [I.V.:1400] Out: 1000 [Urine:900; Blood:100]  BLOOD ADMINISTERED:none  DRAINS: Medium hemovac   LOCAL MEDICATIONS USED:  MARCAINE    and LIDOCAINE   SPECIMEN:  No Specimen  DISPOSITION OF SPECIMEN:  N/A  COUNTS:  YES  TOURNIQUET:  * No tourniquets in log *  DICTATION: .Note written in EPIC  PLAN OF CARE: Already has inpatient order  PATIENT DISPOSITION:  PACU - hemodynamically stable.   Delay start of Pharmacological VTE agent (>24hrs) due to surgical blood loss or risk of bleeding: yes

## 2017-04-27 NOTE — Anesthesia Procedure Notes (Signed)
Procedure Name: Intubation Date/Time: 04/27/2017 2:29 PM Performed by: Mariea Clonts Pre-anesthesia Checklist: Patient identified, Emergency Drugs available, Suction available and Patient being monitored Patient Re-evaluated:Patient Re-evaluated prior to induction Oxygen Delivery Method: Circle System Utilized Preoxygenation: Pre-oxygenation with 100% oxygen Induction Type: IV induction Ventilation: Mask ventilation without difficulty and Oral airway inserted - appropriate to patient size Laryngoscope Size: Glidescope Grade View: Grade I Tube type: Oral Tube size: 7.0 mm Number of attempts: 1 Airway Equipment and Method: Stylet,  Oral airway and Video-laryngoscopy Placement Confirmation: ETT inserted through vocal cords under direct vision,  positive ETCO2 and breath sounds checked- equal and bilateral Tube secured with: Tape Dental Injury: Teeth and Oropharynx as per pre-operative assessment

## 2017-04-27 NOTE — Progress Notes (Signed)
Orthopedic Tech Progress Note Patient Details:  Catherine Coleman [birth year] [id number]  Ortho Devices Type of Ortho Device: Soft collar Ortho Device/Splint Location: neck Ortho Device/Splint Interventions: Ordered, Application   Braulio Bosch 04/27/2017, 8:45 PM

## 2017-04-27 NOTE — Progress Notes (Signed)
Patient signed consent form for tomorrow's surgery and endorsed remaining pre-op procedure to incoming float RN. Spoke to patient's daughter over the phone regarding patient's admission and care.

## 2017-04-27 NOTE — Plan of Care (Addendum)
Patient IV patent. CHG x2 given. MRSA PCR negative. Npo since midnight. Hydrolazine 5mg   And Morphine 4mg  given 0900 IV to correct BP 180/79.  All other meds not given. Consents x2  signed.  All jewelry removed. Patient does not wear dentures (permanent implants only). Is bilateral HOH (not wearing hearing aides.)
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
## 2017-04-27 NOTE — Anesthesia Preprocedure Evaluation (Addendum)
Anesthesia Evaluation  Patient identified by MRN, date of birth, ID band Patient awake    Reviewed: Allergy & Precautions, NPO status , Patient's Chart, lab work & pertinent test results  Airway Mallampati: IV   Neck ROM: Limited   Comment: C Collar in place.  Dental  (+) Teeth Intact, Dental Advisory Given   Pulmonary former smoker,    breath sounds clear to auscultation       Cardiovascular hypertension, negative cardio ROS   Rhythm:Regular Rate:Normal     Neuro/Psych negative neurological ROS  negative psych ROS   GI/Hepatic Neg liver ROS, GERD  Medicated,  Endo/Other  Hypothyroidism   Renal/GU negative Renal ROS     Musculoskeletal  (+) Arthritis , Osteoarthritis,    Abdominal   Peds  Hematology negative hematology ROS (+)   Anesthesia Other Findings R Arm weakness, numbness and tingling before procedure.   Reproductive/Obstetrics negative OB ROS                           Anesthesia Physical Anesthesia Plan  ASA: III  Anesthesia Plan: General   Post-op Pain Management:    Induction: Intravenous  PONV Risk Score and Plan: 4 or greater and Ondansetron, Dexamethasone, Midazolam, Scopolamine patch - Pre-op, Propofol infusion and Treatment may vary due to age or medical condition  Airway Management Planned: Oral ETT and Video Laryngoscope Planned  Additional Equipment:   Intra-op Plan:   Post-operative Plan: Extubation in OR  Informed Consent: I have reviewed the patients History and Physical, chart, labs and discussed the procedure including the risks, benefits and alternatives for the proposed anesthesia with the patient or authorized representative who has indicated his/her understanding and acceptance.   Dental advisory given  Plan Discussed with: CRNA  Anesthesia Plan Comments:         Anesthesia Quick Evaluation

## 2017-04-27 NOTE — Progress Notes (Signed)
I have met the patient and we have discussed the risks, benefits, and alternatives to surgery.  She wishes to proceed.

## 2017-04-27 NOTE — Plan of Care (Signed)
Patient informed that, currently, her procedure will not begin till at least 1400.
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
## 2017-04-28 ENCOUNTER — Encounter (HOSPITAL_COMMUNITY): Payer: Self-pay | Admitting: Neurological Surgery

## 2017-04-28 DIAGNOSIS — R269 Unspecified abnormalities of gait and mobility: Secondary | ICD-10-CM

## 2017-04-28 LAB — CBC
HEMATOCRIT: 36.7 % (ref 36.0–46.0)
Hemoglobin: 12.1 g/dL (ref 12.0–15.0)
MCH: 30.9 pg (ref 26.0–34.0)
MCHC: 33 g/dL (ref 30.0–36.0)
MCV: 93.9 fL (ref 78.0–100.0)
PLATELETS: 184 10*3/uL (ref 150–400)
RBC: 3.91 MIL/uL (ref 3.87–5.11)
RDW: 12.8 % (ref 11.5–15.5)
WBC: 8.1 10*3/uL (ref 4.0–10.5)

## 2017-04-28 LAB — BASIC METABOLIC PANEL
ANION GAP: 10 (ref 5–15)
BUN: 5 mg/dL — AB (ref 6–20)
CALCIUM: 8.3 mg/dL — AB (ref 8.9–10.3)
CO2: 24 mmol/L (ref 22–32)
CREATININE: 0.6 mg/dL (ref 0.44–1.00)
Chloride: 98 mmol/L — ABNORMAL LOW (ref 101–111)
GFR calc Af Amer: >60 mL/min (ref >60)
GLUCOSE: 149 mg/dL — AB (ref 65–99)
POTASSIUM: 4.1 mmol/L (ref 3.5–5.1)
Sodium: 132 mmol/L — ABNORMAL LOW (ref 135–145)

## 2017-04-28 LAB — PROTIME-INR
INR: 1.05
Prothrombin Time: 13.7 s (ref 11.4–15.2)

## 2017-04-28 LAB — APTT: aPTT: 33 seconds (ref 24–36)

## 2017-04-28 MED ORDER — HYDROMORPHONE HCL 1 MG/ML IJ SOLN
1.0000 mg | INTRAMUSCULAR | Status: DC | PRN
  Administered 2017-04-28: 1 mg via INTRAVENOUS
  Filled 2017-04-28: qty 1

## 2017-04-28 MED ORDER — OXYCODONE HCL 5 MG PO TABS
5.0000 mg | ORAL_TABLET | ORAL | Status: DC | PRN
  Administered 2017-04-28 – 2017-04-29 (×4): 10 mg via ORAL
  Filled 2017-04-28 (×4): qty 2

## 2017-04-28 MED ORDER — GUAIFENESIN ER 600 MG PO TB12
600.0000 mg | ORAL_TABLET | Freq: Two times a day (BID) | ORAL | Status: DC | PRN
  Administered 2017-04-30: 600 mg via ORAL
  Filled 2017-04-28: qty 1

## 2017-04-28 MED FILL — Vancomycin HCl For IV Soln 1 GM (Base Equivalent): INTRAVENOUS | Qty: 1000 | Status: AC
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
## 2017-04-28 NOTE — Anesthesia Postprocedure Evaluation (Signed)
Anesthesia Post Note  Patient: Catherine Coleman  Procedure(s) Performed: Procedure(s) (LRB): CERVICAL FIVE-SIX  OPEN REDUCTION OF FRACTURE, CERVICAL THREE-SEVEN  DORSAL FIXATION AND FUSION (N/A)     Patient location during evaluation: Other Anesthesia Type: General Level of consciousness: awake and alert Pain management: pain level controlled Vital Signs Assessment: post-procedure vital signs reviewed and stable Respiratory status: spontaneous breathing, nonlabored ventilation, respiratory function stable and patient connected to nasal cannula oxygen Cardiovascular status: blood pressure returned to baseline and stable Postop Assessment: no signs of nausea or vomiting Anesthetic complications: no    Last Vitals:  Vitals:   04/28/17 0622 04/28/17 1106  BP: (!) 143/71 101/61  Pulse: 69 77  Resp: 18 18  Temp: 36.6 C 36.6 C  SpO2: 96% 98%    Last Pain:  Vitals:   04/28/17 1106  TempSrc: Oral  PainSc:                  [name]

## 2017-04-28 NOTE — Consult Note (Signed)
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 supervision/potential complications: History of hypertension, history of multiple joint replacements 3. Due to bladder management, bowel management, safety, skin/wound care, disease management, medication administration, pain management and patient education, does the patient require 24 hr/day rehab nursing? Potentially 4. Does the patient require coordinated care of a physician, rehab nurse, PT, OT to address physical and functional deficits in the context of the above medical diagnosis(es)? Potentially Addressing deficits in the following areas: balance, endurance, locomotion, strength, transferring, bowel/bladder control, bathing, dressing, feeding, grooming, toileting and psychosocial support 5. Can the patient actively participate in an intensive therapy program of at least 3 hrs of therapy per day at least 5 days per week? Yes 6. The potential for patient to make measurable gains while on inpatient rehab is excellent 7. Anticipated functional outcomes upon discharge from inpatient rehab are modified independent  with PT, modified independent with OT, n/a with SLP. 8. Estimated rehab length of stay to reach the above functional goals is: 7-10  days 9. Anticipated D/C setting: Home 10. Anticipated post D/C treatments: [facility] 11. Overall Rehab/Functional Prognosis: excellent  RECOMMENDATIONS: This patient's condition is appropriate for continued rehabilitative care in the following setting: CIR if requiring at least min assist for mobility and ADLs Patient has agreed to participate in recommended program. Yes Note that insurance prior authorization may be required for reimbursement for recommended care.  Comment: Also need to evaluate whether patient has neurogenic bladder and bowel, if so, inpatient rehabilitation/CIR would be helpful for bowel and bladder retraining,   [name] M.D. [facility] FAAPM&R (Sports Med, Neuromuscular Med) Diplomate Am Board of Electrodiagnostic Med  Flora Lipps 04/28/2017
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
## 2017-04-28 NOTE — Progress Notes (Signed)
Neurosurgery Progress Note  No issues overnight. Complains of right upper extremity discomfort but minimal Would feel more comfortable with hard collar vs soft. Otherwise, feels well. Very happy with surgery  EXAM:  BP 101/61 (BP Location: Right Arm)   Pulse 77   Temp 97.8 F (36.6 C) (Oral)   Resp 18   Ht 5\' 5"  (1.651 m)   Wt 95.8 kg (211 lb 3.2 oz)   SpO2 98%   BMI 35.15 kg/m   Awake, alert, oriented  Speech fluent, appropriate  CN grossly intact  MAEW with goods trength Incision: c/d/i  IMPRESSION/PLAN 83 y.o. female  Post op day 1 from C4-7 dorsal fixation and fusion. RUE discomfort not unexpected. Still has 4+/5 strength in RUE at deltoid bicep and 5/5 otherwise Progressing nicely. Encouraged ambulation. Order for aspen hard collar Likely remove drain tomorrow

## 2017-04-28 NOTE — Progress Notes (Signed)
Pt bladder scanned for the second time with 350cc noted on scanner. In and out catheterization done, 700cc removed

## 2017-04-28 NOTE — Evaluation (Signed)
Physical Therapy Evaluation Patient Details Name: Catherine Coleman MRN: [id number] DOB: 01-19-1932 Today's Date: 04/28/2017   History of Present Illness  Pt is an 83 y/o female s/p C3-C7 dorsal fixation/fusion. PMH including but not limited to glaucoma, HTN and HLD.  Clinical Impression  Pt presented supine in bed with HOB elevated, awake and willing to participate in therapy session. Prior to admission, pt reported that she was independent with all functional mobility and ADLs. Pt lives in the independent living at [facility]. Pt would like to go to rehab there prior to returning to her house where she lives alone. Pt currently requires physical assist of two for bed mobility and transfers with use of RW. Pt would continue to benefit from skilled physical therapy services at this time while admitted and after d/c to address the below listed limitations in order to improve overall safety and independence with functional mobility.     Follow Up Recommendations SNF;Supervision/Assistance - 24 hour;Other (comment) (pt wants rehab at [facility] [place])    Equipment Recommendations  None recommended by PT;Other (comment) (defer to next venue)    Recommendations for Other Services       Precautions / Restrictions Precautions Precautions: Fall;Cervical Precaution Comments: Reviewed cervical precautions handout with pt. Required Braces or Orthoses: Cervical Brace Cervical Brace: Soft collar Restrictions Weight Bearing Restrictions: No      Mobility  Bed Mobility Overal bed mobility: Needs Assistance Bed Mobility: Rolling;Sidelying to Sit Rolling: Min assist Sidelying to sit: Mod assist       General bed mobility comments: increased time and effort, use of bed rails, cueing for log roll technique, assist to elevate trunk to achieve sitting EOB  Transfers Overall transfer level: Needs assistance Equipment used: Rolling walker (2 wheeled) Transfers: Sit to/from [employer] Sit to Stand: Mod assist;+2 physical assistance Stand pivot transfers: Mod assist;+2 safety/equipment;+2 physical assistance       General transfer comment: increased time and effort, cueing for technique, assist for stability and to power into standing, assist for safety with pivotal movements to chair  Ambulation/Gait                Stairs            Wheelchair Mobility    Modified Rankin (Stroke Patients Only)       Balance Overall balance assessment: Needs assistance Sitting-balance support: Feet supported Sitting balance-Leahy Scale: Fair     Standing balance support: During functional activity;Bilateral upper extremity supported Standing balance-Leahy Scale: Poor Standing balance comment: pt with heavy posterior lean and reliant on bilateral UEs on RW                             Pertinent Vitals/Pain Pain Assessment: 0-10 Pain Score: 6  Pain Location: R UE and neck Pain Descriptors / Indicators: Tingling;Sore Pain Intervention(s): Monitored during session;Repositioned    Home Living Family/patient expects to be discharged to:: Other (Comment) ([facility]) Living Arrangements: Alone                    Prior Function Level of Independence: Independent               Hand Dominance   Dominant Hand: Right    Extremity/Trunk Assessment   Upper Extremity Assessment Upper Extremity Assessment: Defer to OT evaluation    Lower Extremity Assessment Lower Extremity Assessment: Overall WFL for tasks assessed  Cervical / Trunk Assessment Cervical / Trunk Assessment: Other exceptions Cervical / Trunk Exceptions: s/p cervical sx  Communication   Communication: No difficulties  Cognition Arousal/Alertness: Awake/alert Behavior During Therapy: WFL for tasks assessed/performed Overall Cognitive Status: Within Functional Limits for tasks assessed                                         General Comments      Exercises     Assessment/Plan    PT Assessment Patient needs continued PT services  PT Problem List Decreased strength;Decreased range of motion;Decreased activity tolerance;Decreased balance;Decreased mobility;Decreased coordination;Decreased knowledge of use of DME;Decreased safety awareness;Decreased knowledge of precautions;Pain       PT Treatment Interventions DME instruction;Gait training;Stair training;Functional mobility training;Therapeutic activities;Therapeutic exercise;Balance training;Neuromuscular re-education;Patient/family education    PT Goals (Current goals can be found in the Care Plan section)  Acute Rehab PT Goals Patient Stated Goal: decreased pain PT Goal Formulation: With patient Time For Goal Achievement: 05/12/17 Potential to Achieve Goals: Fair    Frequency Min 5X/week   Barriers to discharge        Co-evaluation PT/OT/SLP Co-Evaluation/Treatment: Yes Reason for Co-Treatment: For patient/therapist safety;To address functional/ADL transfers PT goals addressed during session: Balance;Mobility/safety with mobility;Proper use of DME;Strengthening/ROM         AM-PAC PT "6 Clicks" Daily Activity  Outcome Measure Difficulty turning over in bed (including adjusting bedclothes, sheets and blankets)?: Total Difficulty moving from lying on back to sitting on the side of the bed? : Total Difficulty sitting down on and standing up from a chair with arms (e.g., wheelchair, bedside commode, etc,.)?: Total Help needed moving to and from a bed to chair (including a wheelchair)?: A Lot Help needed walking in hospital room?: A Lot Help needed climbing 3-5 steps with a railing? : A Lot 6 Click Score: 9    End of Session Equipment Utilized During Treatment: Gait belt;Cervical collar Activity Tolerance: Patient limited by pain Patient left: in chair;with call bell/phone within reach Nurse Communication: Mobility status PT Visit  Diagnosis: Other abnormalities of gait and mobility (R26.89);Pain Pain - Right/Left: Right Pain - part of body: Arm (neck)    Time: 1030-1053 PT Time Calculation (min) (ACUTE ONLY): 23 min   Charges:   PT Evaluation $PT Eval Moderate Complexity: 1 Mod     PT G Codes:        [place], PT, DPT [place] 04/28/2017, 12:08 PM

## 2017-04-28 NOTE — Progress Notes (Signed)
Pt requested an aspen collar, ordered and applied this pm

## 2017-04-28 NOTE — NC FL2 (Signed)
[place] LEVEL OF CARE SCREENING TOOL     IDENTIFICATION  Patient Name: Catherine Coleman Birthdate: 1932-01-04 Sex: female Admission Date (Current Location): 04/26/2017  [facility] and [place] Number:  Herbalist and Address:  [facility] [place], [place] [address], [place], [place] [address]      Provider Number: [id number]  Attending Physician Name and Address:  Md, Trauma, MD  Relative Name and Phone Number:       Current Level of Care: Hospital Recommended Level of Care: [place] Prior Approval Number:    Date Approved/Denied:   PASRR Number:  [id number]   Discharge Plan: SNF    Current Diagnoses: Patient Active Problem List   Diagnosis Date Noted  . Cervical spine fracture ([place]) 04/26/2017  . Parotiditis 04/21/2016  . Breast cancer screening 04/21/2016  . Atrophic rhinitis 04/21/2016  . Obesity 04/02/2016  . Diverticulosis of colon without hemorrhage 04/02/2016  . Vitamin D deficiency 04/02/2016  . Hyperlipidemia, mild 04/02/2016  . Hypertension   . Arthritis   . Hiatal hernia with gastroesophageal reflux   . Glaucoma   . Hypothyroidism   . Primary osteoarthritis of right hip 03/22/2015    Orientation RESPIRATION BLADDER Height & Weight     Self, Situation  O2 (Nasal Cannula; 2L) Continent Weight: 211 lb 3.2 oz (95.8 kg) Height:  5\' 5"  (165.1 cm)  BEHAVIORAL SYMPTOMS/MOOD NEUROLOGICAL BOWEL NUTRITION STATUS      Continent  (Please see d/c summary)  AMBULATORY STATUS COMMUNICATION OF NEEDS Skin   Extensive Assist Verbally                         Personal Care Assistance Level of Assistance  Bathing, Feeding, Dressing Bathing Assistance: Maximum assistance Feeding assistance: Limited assistance Dressing Assistance: Maximum assistance     Functional Limitations Info  Sight, Hearing, Speech Sight Info: Adequate Hearing Info: Adequate Speech Info: Adequate    SPECIAL CARE FACTORS FREQUENCY   PT (By licensed PT), OT (By licensed OT)     PT Frequency: 3x OT Frequency: 3x            Contractures Contractures Info: Not present    Additional Factors Info  Code Status, Allergies Code Status Info: Full Allergies Info: Seasonal allergies(pollen & grass)           Current Medications (04/28/2017):  This is the current hospital active medication list Current Facility-Administered Medications  Medication Dose Route Frequency Provider Last Rate Last Dose  . 0.9 %  sodium chloride infusion   Intravenous Continuous Costella, Vincent J, PA-C      . 0.9 %  sodium chloride infusion  250 mL Intravenous Continuous Costella, Vista Mink, PA-C      . acetaminophen (TYLENOL) tablet 650 mg  650 mg Oral Q4H PRN Costella, Vista Mink, PA-C       Or  . acetaminophen (TYLENOL) suppository 650 mg  650 mg Rectal Q4H PRN Costella, Vista Mink, PA-C      . acetaminophen (TYLENOL) tablet 1,000 mg  1,000 mg Oral Q6H Costella, Vincent J, PA-C   1,000 mg at 04/28/17 0263  . aspirin tablet 325 mg  325 mg Oral Daily Traci Sermon, PA-C   325 mg at 04/28/17 1002  . bisacodyl (DULCOLAX) suppository 10 mg  10 mg Rectal Daily PRN Costella, Vincent J, PA-C      . brimonidine (ALPHAGAN) 0.2 % ophthalmic solution 1 drop  1 drop Both  Eyes BID Judeth Horn, MD   1 drop at 04/28/17 0955  . cyanocobalamin tablet 2,500 mcg  2,500 mcg Oral Daily Judeth Horn, MD   2,500 mcg at 04/28/17 1003  . docusate sodium (COLACE) capsule 100 mg  100 mg Oral BID Costella, Vincent J, PA-C   100 mg at 04/28/17 1003  . gabapentin (NEURONTIN) capsule 300 mg  300 mg Oral TID Costella, Vincent J, PA-C   300 mg at 04/28/17 1003  . guaiFENesin (MUCINEX) 12 hr tablet 600 mg  600 mg Oral BID PRN Meuth, Brooke A, PA-C      . hydrALAZINE (APRESOLINE) injection 5 mg  5 mg Intravenous Q6H PRN Meuth, Brooke A, PA-C   5 mg at 04/27/17 0852  . HYDROmorphone (DILAUDID) injection 1 mg  1 mg Intravenous Q4H PRN Meuth, Brooke A, PA-C      .  levothyroxine (SYNTHROID, LEVOTHROID) tablet 100 mcg  100 mcg Oral QAC breakfast Judeth Horn, MD   100 mcg at 04/28/17 0801  . LORazepam (ATIVAN) tablet 0.5 mg  0.5 mg Oral BID PRN Meuth, Brooke A, PA-C      . losartan (COZAAR) tablet 50 mg  50 mg Oral Daily Judeth Horn, MD   50 mg at 04/28/17 1002  . menthol-cetylpyridinium (CEPACOL) lozenge 3 mg  1 lozenge Oral PRN Costella, Vista Mink, PA-C       Or  . phenol (CHLORASEPTIC) mouth spray 1 spray  1 spray Mouth/Throat PRN Costella, Vista Mink, PA-C      . methocarbamol (ROBAXIN) tablet 500 mg  500 mg Oral Q6H PRN Costella, Vista Mink, PA-C       Or  . methocarbamol (ROBAXIN) 500 mg in dextrose 5 % 50 mL IVPB  500 mg Intravenous Q6H PRN Costella, Vista Mink, PA-C      . multivitamin with minerals tablet 1 tablet  1 tablet Oral Daily Judeth Horn, MD   1 tablet at 04/28/17 1003  . ondansetron (ZOFRAN-ODT) disintegrating tablet 4 mg  4 mg Oral Q6H PRN Judeth Horn, MD       Or  . ondansetron [facility]) injection 4 mg  4 mg Intravenous Q6H PRN Judeth Horn, MD      . oxyCODONE (Oxy IR/ROXICODONE) immediate release tablet 5-10 mg  5-10 mg Oral Q4H PRN Meuth, Brooke A, PA-C      . oxyCODONE (OXYCONTIN) 12 hr tablet 10 mg  10 mg Oral Q12H Costella, Vincent J, PA-C   10 mg at 04/28/17 1003  . pantoprazole (PROTONIX) EC tablet 40 mg  40 mg Oral Daily Judeth Horn, MD   40 mg at 04/28/17 1003  . prochlorperazine (COMPAZINE) tablet 10 mg  10 mg Oral Q6H PRN Judeth Horn, MD       Or  . prochlorperazine (COMPAZINE) injection 5-10 mg  5-10 mg Intravenous Q6H PRN Judeth Horn, MD      . senna (SENOKOT) tablet 8.6 mg  1 tablet Oral BID Costella, Vincent J, PA-C   8.6 mg at 04/28/17 1003  . senna-docusate (Senokot-S) tablet 1 tablet  1 tablet Oral QHS PRN Costella, Vincent J, PA-C      . sodium chloride flush (NS) 0.9 % injection 3 mL  3 mL Intravenous Q12H Costella, Vincent J, PA-C   3 mL at 04/28/17 1005  . sodium chloride flush (NS) 0.9 % injection 3 mL  3 mL  Intravenous PRN Costella, Vincent J, PA-C      . sodium phosphate (FLEET) 7-19 GM/118ML enema 1 enema  1 enema Rectal Once PRN Costella, Vista Mink, PA-C      . zolpidem (AMBIEN) tablet 5 mg  5 mg Oral QHS PRN Costella, Vista Mink, PA-C         Discharge Medications: Please see discharge summary for a list of discharge medications.  Relevant Imaging Results:  Relevant Lab Results:   Additional Information SSN: [id number]  Eileen Stanford, LCSW

## 2017-04-28 NOTE — Evaluation (Signed)
Occupational Therapy Evaluation Patient Details Name: Catherine Coleman MRN: [id number] DOB: 1932/04/24 Today's Date: 04/28/2017    History of Present Illness Pt is an 83 y/o female s/p C3-C7 dorsal fixation/fusion. PMH including but not limited to glaucoma, HTN and HLD.   Clinical Impression   PTA, pt was living at an independent living facility and was independent. Currently pt requiring Max A for LB ADLs, Mod A for UB ADLs, and Mod A +2 for functional transfers. Pt would benefit from further OT to facilitate safe dc.  Recommend dc to SNF for further OT to increase safety and independence with ADLs prior to transitioning to home alone.     Follow Up Recommendations  SNF;Supervision/Assistance - 24 hour    Equipment Recommendations  Other (comment) (Defer to next venue)    Recommendations for Other Services PT consult     Precautions / Restrictions Precautions Precautions: Fall;Cervical Precaution Comments: Reviewed cervical precautions handout with pt. Required Braces or Orthoses: Cervical Brace Cervical Brace: Soft collar Restrictions Weight Bearing Restrictions: No      Mobility Bed Mobility Overal bed mobility: Needs Assistance Bed Mobility: Rolling;Sidelying to Sit Rolling: Min assist Sidelying to sit: Mod assist       General bed mobility comments: increased time and effort, use of bed rails, cueing for log roll technique, assist to elevate trunk to achieve sitting EOB  Transfers Overall transfer level: Needs assistance Equipment used: Rolling walker (2 wheeled) Transfers: Sit to/from [employer] Sit to Stand: Mod assist;+2 physical assistance Stand pivot transfers: Mod assist;+2 safety/equipment;+2 physical assistance       General transfer comment: increased time and effort, cueing for technique, assist for stability and to power into standing, assist for safety with pivotal movements to chair    Balance Overall balance assessment: Needs  assistance Sitting-balance support: Feet supported Sitting balance-Leahy Scale: Fair     Standing balance support: During functional activity;Bilateral upper extremity supported Standing balance-Leahy Scale: Poor Standing balance comment: pt with heavy posterior lean and reliant on bilateral UEs on RW                           ADL either performed or assessed with clinical judgement   ADL Overall ADL's : Needs assistance/impaired Eating/Feeding: Set up;Sitting   Grooming: Set up;Sitting   Upper Body Bathing: Sitting;Moderate assistance   Lower Body Bathing: Maximal assistance;Sit to/from stand   Upper Body Dressing : Sitting;Moderate assistance   Lower Body Dressing: Maximal assistance;Sit to/from stand   Toilet Transfer: Minimal assistance;+2 for physical assistance;Stand-pivot;RW (Simulated to recliner)           Functional mobility during ADLs: Moderate assistance;+2 for physical assistance;Rolling walker General ADL Comments: Pt demonstrating decreased fucntional performance due to posteior lean with unsupported sitting, pain, and light headedness. Pt will need educaton on compensatory techniques for ADLs with cervical precautions.     Vision         Perception     Praxis      Pertinent Vitals/Pain Pain Assessment: 0-10 Pain Score: 6  Pain Location: R UE and neck Pain Descriptors / Indicators: Tingling;Sore Pain Intervention(s): Monitored during session;Limited activity within patient's tolerance;Repositioned     Hand Dominance Right   Extremity/Trunk Assessment Upper Extremity Assessment Upper Extremity Assessment: Overall WFL for tasks assessed   Lower Extremity Assessment Lower Extremity Assessment: Overall WFL for tasks assessed   Cervical / Trunk Assessment Cervical / Trunk Assessment: Other exceptions Cervical / Trunk Exceptions:  s/p cervical sx   Communication Communication Communication: No difficulties   Cognition  Arousal/Alertness: Awake/alert Behavior During Therapy: WFL for tasks assessed/performed Overall Cognitive Status: Within Functional Limits for tasks assessed                                     General Comments       Exercises     Shoulder Instructions      Home Living Family/patient expects to be discharged to:: Other (Comment) ([facility]) Living Arrangements: Alone                                      Prior Functioning/Environment Level of Independence: Independent                 OT Problem List: Decreased range of motion;Decreased activity tolerance;Impaired balance (sitting and/or standing);Decreased safety awareness;Decreased knowledge of use of DME or AE;Decreased knowledge of precautions;Pain      OT Treatment/Interventions: Self-care/ADL training;Therapeutic exercise;Energy conservation;DME and/or AE instruction;Therapeutic activities;Patient/family education    OT Goals(Current goals can be found in the care plan section) Acute Rehab OT Goals Patient Stated Goal: decreased pain OT Goal Formulation: With patient Time For Goal Achievement: 05/12/17 Potential to Achieve Goals: Good ADL Goals Pt Will Perform Upper Body Dressing: with min assist;sitting (adhering to cervical precautions) Pt Will Perform Lower Body Dressing: with min assist;sit to/from stand (with or without ae) Pt Will Transfer to Toilet: with min guard assist;bedside commode;ambulating Pt Will Perform Toileting - Clothing Manipulation and hygiene: with min guard assist;sit to/from stand  OT Frequency: Min 2X/week   Barriers to D/C:            Co-evaluation PT/OT/SLP Co-Evaluation/Treatment: Yes Reason for Co-Treatment: For patient/therapist safety PT goals addressed during session: Balance;Mobility/safety with mobility OT goals addressed during session: ADL's and self-care      AM-PAC PT "6 Clicks" Daily Activity     Outcome Measure Help  from another person eating meals?: None Help from another person taking care of personal grooming?: A Little Help from another person toileting, which includes using toliet, bedpan, or urinal?: A Little Help from another person bathing (including washing, rinsing, drying)?: A Lot Help from another person to put on and taking off regular upper body clothing?: A Little Help from another person to put on and taking off regular lower body clothing?: A Lot 6 Click Score: 17   End of Session Equipment Utilized During Treatment: Gait belt;Rolling walker;Cervical collar Nurse Communication: Mobility status;Precautions  Activity Tolerance: Patient limited by pain;Patient tolerated treatment well Patient left: in chair;with call bell/phone within reach  OT Visit Diagnosis: Unsteadiness on feet (R26.81);Other abnormalities of gait and mobility (R26.89);Pain Pain - Right/Left:  (Cervical) Pain - part of body:  (cervical)                Time: 6195-0932 OT Time Calculation (min): 20 min Charges:  OT General Charges $OT Visit: 1 Procedure OT Evaluation $OT Eval Low Complexity: 1 Procedure G-Codes:     Ko Bardon MSOT, OTR/L Acute Rehab Pager: [phone] Office: [place] 04/28/2017, 2:06 PM
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
## 2017-04-29 ENCOUNTER — Inpatient Hospital Stay (HOSPITAL_COMMUNITY): Payer: No Typology Code available for payment source

## 2017-04-29 LAB — CBC
HEMATOCRIT: 35.7 % — AB (ref 36.0–46.0)
Hemoglobin: 12.1 g/dL (ref 12.0–15.0)
MCH: 32.9 pg (ref 26.0–34.0)
MCHC: 33.9 g/dL (ref 30.0–36.0)
MCV: 97 fL (ref 78.0–100.0)
Platelets: 136 10*3/uL — ABNORMAL LOW (ref 150–400)
RBC: 3.68 MIL/uL — ABNORMAL LOW (ref 3.87–5.11)
RDW: 13.5 % (ref 11.5–15.5)
WBC: 9.4 10*3/uL (ref 4.0–10.5)

## 2017-04-29 LAB — BASIC METABOLIC PANEL
Anion gap: 8 (ref 5–15)
BUN: 10 mg/dL (ref 6–20)
CALCIUM: 8.1 mg/dL — AB (ref 8.9–10.3)
CHLORIDE: 98 mmol/L — AB (ref 101–111)
CO2: 27 mmol/L (ref 22–32)
CREATININE: 0.56 mg/dL (ref 0.44–1.00)
Glucose, Bld: 119 mg/dL — ABNORMAL HIGH (ref 65–99)
Potassium: 4.5 mmol/L (ref 3.5–5.1)
Sodium: 133 mmol/L — ABNORMAL LOW (ref 135–145)

## 2017-04-29 MED ORDER — BETHANECHOL CHLORIDE 5 MG PO TABS
5.0000 mg | ORAL_TABLET | Freq: Three times a day (TID) | ORAL | Status: DC
  Administered 2017-04-29 – 2017-04-30 (×3): 5 mg via ORAL
  Filled 2017-04-29 (×6): qty 1

## 2017-04-29 MED ORDER — ENOXAPARIN SODIUM 40 MG/0.4ML ~~LOC~~ SOLN
40.0000 mg | SUBCUTANEOUS | Status: DC
  Administered 2017-04-29 – 2017-04-30 (×2): 40 mg via SUBCUTANEOUS
  Filled 2017-04-29 (×2): qty 0.4

## 2017-04-29 MED ORDER — TAMSULOSIN HCL 0.4 MG PO CAPS
0.4000 mg | ORAL_CAPSULE | Freq: Every day | ORAL | Status: DC
  Administered 2017-04-29 – 2017-04-30 (×2): 0.4 mg via ORAL
  Filled 2017-04-29 (×2): qty 1

## 2017-04-29 MED ORDER — OXYCODONE HCL 5 MG PO TABS
5.0000 mg | ORAL_TABLET | Freq: Four times a day (QID) | ORAL | Status: DC | PRN
  Administered 2017-04-29 – 2017-04-30 (×3): 5 mg via ORAL
  Filled 2017-04-29 (×3): qty 1

## 2017-04-29 MED ORDER — POLYETHYLENE GLYCOL 3350 17 G PO PACK
17.0000 g | PACK | Freq: Every day | ORAL | Status: DC
  Administered 2017-04-29 – 2017-04-30 (×2): 17 g via ORAL
  Filled 2017-04-29 (×2): qty 1

## 2017-04-29 NOTE — Progress Notes (Signed)
Events noted from yesterday/today Agree with Flomax and bladder rest  Cleared from NS perspective for discharge  F/U 3 weeks for wound check  Please call for any concerns
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
## 2017-04-29 NOTE — Progress Notes (Signed)
Rehab admissions - I met with patient at the bedside.  I gave her rehab booklets and we discussed inpatient rehab admission versus SNF.  Patient has now decided that she wants to come to inpatient rehab.  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  She is having trouble voiding and is undergoing in and out catheterizations at this time.  I will try to get a bed for her on inpatient rehab tomorrow or Saturday pending bed availability.  Call me for questions.  #[phone]

## 2017-04-29 NOTE — Progress Notes (Signed)
Physical Therapy Treatment Patient Details Name: Catherine Coleman MRN: [id number] DOB: [birth year] Today's Date: 04/29/2017    History of Present Illness Pt is an 83 y/o female s/p C3-C7 dorsal fixation/fusion. PMH including but not limited to glaucoma, HTN and HLD.    PT Comments    Pt is showing progress from previous session, requiring less assist for mobilities and beginning to ambulate in hall. Pt with unsafe gait speed and requires frequent cueing to slow down. Ambulation distance limited secondary to pt fatigue and c/o "dizzy and weak". Patient would benefit from continued skilled PT to increase activity tolerance and safety with ambulation. Will continue to follow acutely.     Follow Up Recommendations  SNF;Supervision/Assistance - 24 hour;Other (comment) (pt wants rehab at [facility] at [place])     Equipment Recommendations  None recommended by PT;Other (comment) (defer to next venue)    Recommendations for Other Services       Precautions / Restrictions Precautions Precautions: Fall;Cervical Precaution Comments: Pt unable to recall any cervical precautions (Simultaneous filing. User may not have seen previous data.) Required Braces or Orthoses: Cervical Brace Cervical Brace: Soft collar Restrictions Weight Bearing Restrictions: No    Mobility  Bed Mobility Overal bed mobility: Needs Assistance Bed Mobility: Rolling;Sidelying to Sit Rolling: Min assist;+2 for safety/equipment Sidelying to sit: Mod assist;HOB elevated;+2 for safety/equipment       General bed mobility comments: increased time and effort, use of bed rails, cueing for log roll technique, assist to elevate trunk to achieve sitting EOB  Transfers Overall transfer level: Needs assistance Equipment used: Rolling walker (2 wheeled) Transfers: Sit to/from Stand Sit to Stand: Min assist         General transfer comment: Min a to power up into standing. Cueing for  technique.  Ambulation/Gait Ambulation/Gait assistance: Min assist;+2 safety/equipment Ambulation Distance (Feet): 80 Feet Assistive device: Rolling walker (2 wheeled) Gait Pattern/deviations: Shuffle;Trunk flexed;Narrow base of support     General Gait Details: Pt with quick and slightly unsteady gait. Cues to decrease speed and for walker proximity. Min A for steadying. Pt fatigues quickly, c/o feeling weak after ambulating into hall.   Stairs            Wheelchair Mobility    Modified Rankin (Stroke Patients Only)       Balance Overall balance assessment: Needs assistance Sitting-balance support: Feet supported Sitting balance-Leahy Scale: Fair     Standing balance support: During functional activity;Bilateral upper extremity supported Standing balance-Leahy Scale: Poor Standing balance comment: reliant on RW for support                            Cognition Arousal/Alertness: Awake/alert Behavior During Therapy: WFL for tasks assessed/performed Overall Cognitive Status: Within Functional Limits for tasks assessed                                        Exercises      General Comments        Pertinent Vitals/Pain Pain Assessment: Faces Pain Score: 4  Pain Location: R UE and neck Pain Descriptors / Indicators: Tingling;Sore Pain Intervention(s): Monitored during session;Limited activity within patient's tolerance    Home Living                      Prior Function  PT Goals (current goals can now be found in the care plan section) Acute Rehab PT Goals Patient Stated Goal: decreased pain PT Goal Formulation: With patient Time For Goal Achievement: 05/12/17 Potential to Achieve Goals: Fair Progress towards PT goals: Progressing toward goals    Frequency    Min 5X/week      PT Plan Current plan remains appropriate    Co-evaluation              AM-PAC PT "6 Clicks" Daily Activity   Outcome Measure  Difficulty turning over in bed (including adjusting bedclothes, sheets and blankets)?: Total Difficulty moving from lying on back to sitting on the side of the bed? : Total Difficulty sitting down on and standing up from a chair with arms (e.g., wheelchair, bedside commode, etc,.)?: Total Help needed moving to and from a bed to chair (including a wheelchair)?: A Little Help needed walking in hospital room?: A Little Help needed climbing 3-5 steps with a railing? : A Lot 6 Click Score: 11    End of Session Equipment Utilized During Treatment: Gait belt;Cervical collar Activity Tolerance: Patient limited by pain Patient left: in chair;with call bell/phone within reach;with family/visitor present Nurse Communication: Mobility status PT Visit Diagnosis: Other abnormalities of gait and mobility (R26.89);Pain Pain - Right/Left: Right Pain - part of body: Arm (neck)     Time: 6962-9528 PT Time Calculation (min) (ACUTE ONLY): 14 min  Charges:  $Gait Training: 8-22 mins                    G Codes:       Benjiman Core, [place] Pager [phone] Acute Rehab   Allena Katz 04/29/2017, 2:47 PM

## 2017-04-30 ENCOUNTER — Encounter (HOSPITAL_COMMUNITY): Payer: Self-pay | Admitting: Nurse Practitioner

## 2017-04-30 ENCOUNTER — Inpatient Hospital Stay (HOSPITAL_COMMUNITY)
Admission: RE | Admit: 2017-04-30 | Discharge: 2017-05-06 | DRG: 560 | Disposition: A | Discharge status: Skilled Nursing Facility | Payer: No Typology Code available for payment source | Source: Intra-hospital | Attending: Physical Medicine & Rehabilitation | Admitting: Physical Medicine & Rehabilitation

## 2017-04-30 DIAGNOSIS — S13101A Dislocation of unspecified cervical vertebrae, initial encounter: Secondary | ICD-10-CM | POA: Diagnosis not present

## 2017-04-30 DIAGNOSIS — H409 Unspecified glaucoma: Secondary | ICD-10-CM | POA: Diagnosis present

## 2017-04-30 DIAGNOSIS — Z87891 Personal history of nicotine dependence: Secondary | ICD-10-CM

## 2017-04-30 DIAGNOSIS — E039 Hypothyroidism, unspecified: Secondary | ICD-10-CM | POA: Diagnosis present

## 2017-04-30 DIAGNOSIS — B9689 Other specified bacterial agents as the cause of diseases classified elsewhere: Secondary | ICD-10-CM | POA: Diagnosis not present

## 2017-04-30 DIAGNOSIS — G8918 Other acute postprocedural pain: Secondary | ICD-10-CM | POA: Diagnosis not present

## 2017-04-30 DIAGNOSIS — D696 Thrombocytopenia, unspecified: Secondary | ICD-10-CM | POA: Diagnosis present

## 2017-04-30 DIAGNOSIS — Z79899 Other long term (current) drug therapy: Secondary | ICD-10-CM

## 2017-04-30 DIAGNOSIS — J310 Chronic rhinitis: Secondary | ICD-10-CM | POA: Diagnosis present

## 2017-04-30 DIAGNOSIS — Z981 Arthrodesis status: Secondary | ICD-10-CM | POA: Diagnosis not present

## 2017-04-30 DIAGNOSIS — E785 Hyperlipidemia, unspecified: Secondary | ICD-10-CM | POA: Diagnosis present

## 2017-04-30 DIAGNOSIS — E669 Obesity, unspecified: Secondary | ICD-10-CM | POA: Diagnosis present

## 2017-04-30 DIAGNOSIS — Y92410 Unspecified street and highway as the place of occurrence of the external cause: Secondary | ICD-10-CM

## 2017-04-30 DIAGNOSIS — Z96641 Presence of right artificial hip joint: Secondary | ICD-10-CM | POA: Diagnosis present

## 2017-04-30 DIAGNOSIS — K219 Gastro-esophageal reflux disease without esophagitis: Secondary | ICD-10-CM | POA: Diagnosis present

## 2017-04-30 DIAGNOSIS — R52 Pain, unspecified: Secondary | ICD-10-CM | POA: Diagnosis not present

## 2017-04-30 DIAGNOSIS — K5900 Constipation, unspecified: Secondary | ICD-10-CM | POA: Diagnosis present

## 2017-04-30 DIAGNOSIS — Z8249 Family history of ischemic heart disease and other diseases of the circulatory system: Secondary | ICD-10-CM

## 2017-04-30 DIAGNOSIS — K449 Diaphragmatic hernia without obstruction or gangrene: Secondary | ICD-10-CM | POA: Diagnosis present

## 2017-04-30 DIAGNOSIS — E559 Vitamin D deficiency, unspecified: Secondary | ICD-10-CM | POA: Diagnosis present

## 2017-04-30 DIAGNOSIS — S2220XS Unspecified fracture of sternum, sequela: Secondary | ICD-10-CM | POA: Diagnosis not present

## 2017-04-30 DIAGNOSIS — Z9049 Acquired absence of other specified parts of digestive tract: Secondary | ICD-10-CM | POA: Diagnosis not present

## 2017-04-30 DIAGNOSIS — D519 Vitamin B12 deficiency anemia, unspecified: Secondary | ICD-10-CM | POA: Diagnosis not present

## 2017-04-30 DIAGNOSIS — E871 Hypo-osmolality and hyponatremia: Secondary | ICD-10-CM | POA: Diagnosis present

## 2017-04-30 DIAGNOSIS — Z9181 History of falling: Secondary | ICD-10-CM

## 2017-04-30 DIAGNOSIS — I10 Essential (primary) hypertension: Secondary | ICD-10-CM | POA: Diagnosis present

## 2017-04-30 DIAGNOSIS — Z888 Allergy status to other drugs, medicaments and biological substances status: Secondary | ICD-10-CM

## 2017-04-30 DIAGNOSIS — N390 Urinary tract infection, site not specified: Secondary | ICD-10-CM | POA: Diagnosis not present

## 2017-04-30 DIAGNOSIS — N319 Neuromuscular dysfunction of bladder, unspecified: Secondary | ICD-10-CM | POA: Diagnosis not present

## 2017-04-30 DIAGNOSIS — S2220XA Unspecified fracture of sternum, initial encounter for closed fracture: Secondary | ICD-10-CM

## 2017-04-30 DIAGNOSIS — Z96611 Presence of right artificial shoulder joint: Secondary | ICD-10-CM | POA: Diagnosis present

## 2017-04-30 DIAGNOSIS — R05 Cough: Secondary | ICD-10-CM | POA: Diagnosis not present

## 2017-04-30 DIAGNOSIS — S2222XS Fracture of body of sternum, sequela: Secondary | ICD-10-CM | POA: Diagnosis not present

## 2017-04-30 DIAGNOSIS — E639 Nutritional deficiency, unspecified: Secondary | ICD-10-CM | POA: Diagnosis not present

## 2017-04-30 DIAGNOSIS — Z96653 Presence of artificial knee joint, bilateral: Secondary | ICD-10-CM | POA: Diagnosis present

## 2017-04-30 DIAGNOSIS — Z833 Family history of diabetes mellitus: Secondary | ICD-10-CM

## 2017-04-30 DIAGNOSIS — R339 Retention of urine, unspecified: Secondary | ICD-10-CM | POA: Diagnosis present

## 2017-04-30 DIAGNOSIS — S2232XD Fracture of one rib, left side, subsequent encounter for fracture with routine healing: Secondary | ICD-10-CM

## 2017-04-30 DIAGNOSIS — Z7901 Long term (current) use of anticoagulants: Secondary | ICD-10-CM | POA: Diagnosis not present

## 2017-04-30 DIAGNOSIS — A499 Bacterial infection, unspecified: Secondary | ICD-10-CM | POA: Diagnosis not present

## 2017-04-30 DIAGNOSIS — M1990 Unspecified osteoarthritis, unspecified site: Secondary | ICD-10-CM | POA: Diagnosis not present

## 2017-04-30 DIAGNOSIS — M791 Myalgia: Secondary | ICD-10-CM | POA: Diagnosis not present

## 2017-04-30 DIAGNOSIS — S2220XD Unspecified fracture of sternum, subsequent encounter for fracture with routine healing: Secondary | ICD-10-CM | POA: Diagnosis not present

## 2017-04-30 DIAGNOSIS — F419 Anxiety disorder, unspecified: Secondary | ICD-10-CM | POA: Diagnosis not present

## 2017-04-30 DIAGNOSIS — Z7982 Long term (current) use of aspirin: Secondary | ICD-10-CM

## 2017-04-30 DIAGNOSIS — G43109 Migraine with aura, not intractable, without status migrainosus: Secondary | ICD-10-CM | POA: Diagnosis not present

## 2017-04-30 DIAGNOSIS — H4010X0 Unspecified open-angle glaucoma, stage unspecified: Secondary | ICD-10-CM | POA: Diagnosis not present

## 2017-04-30 DIAGNOSIS — S129XXD Fracture of neck, unspecified, subsequent encounter: Secondary | ICD-10-CM | POA: Diagnosis not present

## 2017-04-30 MED ORDER — LORAZEPAM 0.5 MG PO TABS: 0.5000 mg | ORAL_TABLET | Freq: Two times a day (BID) | ORAL | Status: DC | PRN

## 2017-04-30 MED ORDER — POLYETHYLENE GLYCOL 3350 17 G PO PACK
17.0000 g | PACK | Freq: Two times a day (BID) | ORAL | Status: DC
  Administered 2017-04-30 – 2017-05-06 (×9): 17 g via ORAL
  Filled 2017-04-30 (×13): qty 1

## 2017-04-30 MED ORDER — DIPHENHYDRAMINE HCL 12.5 MG/5ML PO ELIX: 12.5000 mg | ORAL_SOLUTION | Freq: Four times a day (QID) | ORAL | Status: DC | PRN

## 2017-04-30 MED ORDER — PROCHLORPERAZINE 25 MG RE SUPP: 12.5000 mg | Freq: Four times a day (QID) | RECTAL | Status: DC | PRN

## 2017-04-30 MED ORDER — ENOXAPARIN SODIUM 40 MG/0.4ML ~~LOC~~ SOLN
40.0000 mg | SUBCUTANEOUS | Status: DC
  Administered 2017-05-01: 40 mg via SUBCUTANEOUS
  Filled 2017-04-30: qty 0.4

## 2017-04-30 MED ORDER — ASPIRIN 325 MG PO TABS
325.0000 mg | ORAL_TABLET | Freq: Every day | ORAL | Status: DC
  Administered 2017-05-01 – 2017-05-06 (×6): 325 mg via ORAL
  Filled 2017-04-30 (×7): qty 1

## 2017-04-30 MED ORDER — OXYCODONE HCL 5 MG PO TABS
5.0000 mg | ORAL_TABLET | ORAL | Status: DC | PRN
  Administered 2017-04-30 – 2017-05-02 (×9): 10 mg via ORAL
  Administered 2017-05-03: 5 mg via ORAL
  Administered 2017-05-03 (×2): 10 mg via ORAL
  Administered 2017-05-03: 5 mg via ORAL
  Administered 2017-05-04 – 2017-05-06 (×7): 10 mg via ORAL
  Filled 2017-04-30 (×14): qty 2
  Filled 2017-04-30: qty 1
  Filled 2017-04-30 (×4): qty 2
  Filled 2017-04-30: qty 1
  Filled 2017-04-30: qty 2

## 2017-04-30 MED ORDER — METHOCARBAMOL 500 MG PO TABS
500.0000 mg | ORAL_TABLET | Freq: Three times a day (TID) | ORAL | Status: DC
  Administered 2017-04-30: 500 mg via ORAL
  Filled 2017-04-30: qty 1

## 2017-04-30 MED ORDER — GABAPENTIN 300 MG PO CAPS: 600.0000 mg | ORAL_CAPSULE | Freq: Three times a day (TID) | ORAL | Status: DC

## 2017-04-30 MED ORDER — VITAMIN B-12 1000 MCG PO TABS
2500.0000 ug | ORAL_TABLET | Freq: Every day | ORAL | Status: DC
  Administered 2017-05-01 – 2017-05-02 (×2): 2500 ug via ORAL
  Administered 2017-05-03: 08:00:00 via ORAL
  Administered 2017-05-04 – 2017-05-06 (×3): 2500 ug via ORAL
  Filled 2017-04-30 (×7): qty 3

## 2017-04-30 MED ORDER — BETHANECHOL CHLORIDE 10 MG PO TABS
5.0000 mg | ORAL_TABLET | Freq: Three times a day (TID) | ORAL | Status: DC
  Administered 2017-04-30 – 2017-05-02 (×5): 5 mg via ORAL
  Filled 2017-04-30 (×5): qty 1

## 2017-04-30 MED ORDER — ACETAMINOPHEN 325 MG PO TABS
650.0000 mg | ORAL_TABLET | Freq: Four times a day (QID) | ORAL | Status: DC
  Administered 2017-04-30: 650 mg via ORAL
  Filled 2017-04-30: qty 2

## 2017-04-30 MED ORDER — ADULT MULTIVITAMIN W/MINERALS CH
1.0000 | ORAL_TABLET | Freq: Every day | ORAL | Status: DC
  Administered 2017-05-01 – 2017-05-06 (×6): 1 via ORAL
  Filled 2017-04-30 (×6): qty 1

## 2017-04-30 MED ORDER — FLEET ENEMA 7-19 GM/118ML RE ENEM
1.0000 | ENEMA | Freq: Once | RECTAL | Status: DC | PRN
  Filled 2017-04-30: qty 1

## 2017-04-30 MED ORDER — POLYETHYLENE GLYCOL 3350 17 G PO PACK
17.0000 g | PACK | Freq: Every day | ORAL | Status: DC | PRN
  Filled 2017-04-30: qty 1

## 2017-04-30 MED ORDER — DOCUSATE SODIUM 100 MG PO CAPS
100.0000 mg | ORAL_CAPSULE | Freq: Two times a day (BID) | ORAL | Status: DC
  Administered 2017-04-30 – 2017-05-06 (×10): 100 mg via ORAL
  Filled 2017-04-30 (×12): qty 1

## 2017-04-30 MED ORDER — PANTOPRAZOLE SODIUM 40 MG PO TBEC
40.0000 mg | DELAYED_RELEASE_TABLET | Freq: Every day | ORAL | Status: DC
  Administered 2017-05-01 – 2017-05-06 (×6): 40 mg via ORAL
  Filled 2017-04-30 (×6): qty 1

## 2017-04-30 MED ORDER — GABAPENTIN 300 MG PO CAPS
300.0000 mg | ORAL_CAPSULE | Freq: Three times a day (TID) | ORAL | Status: DC
  Administered 2017-04-30 – 2017-05-04 (×11): 300 mg via ORAL
  Filled 2017-04-30 (×11): qty 1

## 2017-04-30 MED ORDER — HYDROMORPHONE HCL 1 MG/ML IJ SOLN: 1.0000 mg | Freq: Four times a day (QID) | INTRAMUSCULAR | Status: DC | PRN

## 2017-04-30 MED ORDER — MENTHOL 3 MG MT LOZG
1.0000 | LOZENGE | OROMUCOSAL | Status: DC | PRN
  Filled 2017-04-30: qty 9

## 2017-04-30 MED ORDER — PROCHLORPERAZINE EDISYLATE 5 MG/ML IJ SOLN: 5.0000 mg | Freq: Four times a day (QID) | INTRAMUSCULAR | Status: DC | PRN

## 2017-04-30 MED ORDER — BISACODYL 10 MG RE SUPP: 10.0000 mg | Freq: Every day | RECTAL | Status: DC | PRN

## 2017-04-30 MED ORDER — LEVOTHYROXINE SODIUM 100 MCG PO TABS
100.0000 ug | ORAL_TABLET | Freq: Every day | ORAL | Status: DC
  Administered 2017-05-01 – 2017-05-06 (×6): 100 ug via ORAL
  Filled 2017-04-30 (×6): qty 1

## 2017-04-30 MED ORDER — TRAMADOL HCL 50 MG PO TABS
50.0000 mg | ORAL_TABLET | Freq: Four times a day (QID) | ORAL | Status: DC | PRN
  Administered 2017-05-03 – 2017-05-06 (×2): 50 mg via ORAL
  Filled 2017-04-30 (×3): qty 1

## 2017-04-30 MED ORDER — GUAIFENESIN-DM 100-10 MG/5ML PO SYRP
5.0000 mL | ORAL_SOLUTION | Freq: Four times a day (QID) | ORAL | Status: DC | PRN
  Administered 2017-05-01 – 2017-05-03 (×3): 10 mL via ORAL
  Filled 2017-04-30 (×3): qty 10

## 2017-04-30 MED ORDER — PROCHLORPERAZINE MALEATE 5 MG PO TABS: 5.0000 mg | ORAL_TABLET | Freq: Four times a day (QID) | ORAL | Status: DC | PRN

## 2017-04-30 MED ORDER — MAGNESIUM CITRATE PO SOLN
1.0000 | Freq: Once | ORAL | Status: AC
  Administered 2017-04-30: 1 via ORAL
  Filled 2017-04-30: qty 296

## 2017-04-30 MED ORDER — TAMSULOSIN HCL 0.4 MG PO CAPS
0.4000 mg | ORAL_CAPSULE | Freq: Every day | ORAL | Status: DC
  Administered 2017-05-01 – 2017-05-06 (×6): 0.4 mg via ORAL
  Filled 2017-04-30 (×6): qty 1

## 2017-04-30 MED ORDER — GUAIFENESIN ER 600 MG PO TB12: 600.0000 mg | ORAL_TABLET | Freq: Two times a day (BID) | ORAL | Status: DC | PRN

## 2017-04-30 MED ORDER — OXYCODONE HCL 5 MG PO TABS
5.0000 mg | ORAL_TABLET | ORAL | Status: DC | PRN
  Administered 2017-04-30: 10 mg via ORAL
  Filled 2017-04-30: qty 2

## 2017-04-30 MED ORDER — TRAZODONE HCL 50 MG PO TABS
25.0000 mg | ORAL_TABLET | Freq: Every evening | ORAL | Status: DC | PRN
Start: 2017-04-30 — End: 2017-05-06
  Administered 2017-04-30 – 2017-05-02 (×3): 50 mg via ORAL
  Administered 2017-05-03: 25 mg via ORAL
  Administered 2017-05-04 – 2017-05-05 (×2): 50 mg via ORAL
  Filled 2017-04-30 (×6): qty 1

## 2017-04-30 MED ORDER — ACETAMINOPHEN 325 MG PO TABS
325.0000 mg | ORAL_TABLET | ORAL | Status: DC | PRN
  Administered 2017-05-01 – 2017-05-05 (×6): 650 mg via ORAL
  Filled 2017-04-30 (×8): qty 2

## 2017-04-30 MED ORDER — METHOCARBAMOL 500 MG PO TABS
500.0000 mg | ORAL_TABLET | Freq: Three times a day (TID) | ORAL | Status: DC
  Administered 2017-04-30 – 2017-05-01 (×2): 500 mg via ORAL
  Filled 2017-04-30 (×2): qty 1

## 2017-04-30 MED ORDER — ALUM & MAG HYDROXIDE-SIMETH 200-200-20 MG/5ML PO SUSP: 30.0000 mL | ORAL | Status: DC | PRN

## 2017-04-30 MED ORDER — LOSARTAN POTASSIUM 50 MG PO TABS
50.0000 mg | ORAL_TABLET | Freq: Every day | ORAL | Status: DC
  Administered 2017-05-01 – 2017-05-06 (×6): 50 mg via ORAL
  Filled 2017-04-30 (×6): qty 1

## 2017-04-30 MED ORDER — ACETAMINOPHEN 650 MG RE SUPP
650.0000 mg | Freq: Four times a day (QID) | RECTAL | Status: DC
  Filled 2017-04-30: qty 1

## 2017-04-30 MED ORDER — PHENOL 1.4 % MT LIQD: 1.0000 | OROMUCOSAL | Status: DC | PRN

## 2017-04-30 MED ORDER — BRIMONIDINE TARTRATE 0.2 % OP SOLN
1.0000 [drp] | Freq: Two times a day (BID) | OPHTHALMIC | Status: DC
  Administered 2017-04-30 – 2017-05-06 (×11): 1 [drp] via OPHTHALMIC
  Filled 2017-04-30 (×2): qty 5

## 2017-04-30 MED ORDER — BRIMONIDINE TARTRATE 0.2 % OP SOLN
1.0000 [drp] | Freq: Two times a day (BID) | OPHTHALMIC | Status: DC
  Administered 2017-05-03 – 2017-05-04 (×2): 1 [drp] via OPHTHALMIC
  Filled 2017-04-30 (×2): qty 5

## 2017-04-30 MED ORDER — ENOXAPARIN SODIUM 40 MG/0.4ML ~~LOC~~ SOLN
40.0000 mg | SUBCUTANEOUS | Status: DC
  Administered 2017-05-02 – 2017-05-06 (×5): 40 mg via SUBCUTANEOUS
  Filled 2017-04-30 (×6): qty 0.4

## 2017-04-30 NOTE — Care Management Important Message (Signed)
Important Message  Patient Details  Name: Catherine Coleman MRN: [id number] Date of Birth: [birth year]   Medicare Important Message Given:  Yes    Nathen May 04/30/2017, 11:24 AM
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
## 2017-04-30 NOTE — Progress Notes (Signed)
Physical Therapy Treatment Patient Details Name: Catherine Coleman MRN: [id number] DOB: [birth year] Today's Date: 04/30/2017    History of Present Illness Pt is an 83 y/o female s/p C3-C7 dorsal fixation/fusion. PMH including but not limited to glaucoma, HTN and HLD.    PT Comments    Pt is showing progress towards goals as she requires less assist with mobilizing and is increasing ambulation distance. Pt is currently limited by lightheadedness and fatigue during ambulation. Expected to be admitted to CIR this PM. She would benefit from continued skilled PT to increase functional independence and activity tolerance.    Follow Up Recommendations  CIR     Equipment Recommendations  None recommended by PT;Other (comment) (defer to next venue)    Recommendations for Other Services       Precautions / Restrictions Precautions Precautions: Fall;Cervical Precaution Comments: pt can recall 3/3 precautions Required Braces or Orthoses: Cervical Brace Cervical Brace: Soft collar Restrictions Weight Bearing Restrictions: No    Mobility  Bed Mobility Overal bed mobility: Needs Assistance Bed Mobility: Rolling;Sidelying to Sit Rolling: Min guard Sidelying to sit: Min assist;HOB elevated       General bed mobility comments:  increased time and effort, use of bed rails. Assist to elevate trunk to sit EOB. Pt d/o dizziness on rising to a seated position.  Transfers Overall transfer level: Needs assistance Equipment used: Rolling walker (2 wheeled) Transfers: Sit to/from Stand Sit to Stand: Min assist         General transfer comment: Min a to power up into standing. Cueing for technique.  Ambulation/Gait Ambulation/Gait assistance: Min guard Ambulation Distance (Feet): 110 Feet Assistive device: Rolling walker (2 wheeled) Gait Pattern/deviations: Shuffle;Trunk flexed     General Gait Details: Pt with safer gait speed in today's session. No assist required for steadying. pt  continues to be limited by fatigue and lightheadedness.    Stairs            Wheelchair Mobility    Modified Rankin (Stroke Patients Only)       Balance Overall balance assessment: Needs assistance Sitting-balance support: Feet supported Sitting balance-Leahy Scale: Fair     Standing balance support: During functional activity;Bilateral upper extremity supported Standing balance-Leahy Scale: Poor Standing balance comment: reliant on RW for support                            Cognition Arousal/Alertness: Awake/alert Behavior During Therapy: WFL for tasks assessed/performed Overall Cognitive Status: Within Functional Limits for tasks assessed                                        Exercises      General Comments        Pertinent Vitals/Pain Pain Assessment: Faces Faces Pain Scale: Hurts little more Pain Location: R UE and neck Pain Descriptors / Indicators: Tingling;Sore Pain Intervention(s): Monitored during session;Limited activity within patient's tolerance    Home Living                      Prior Function            PT Goals (current goals can now be found in the care plan section) Acute Rehab PT Goals Patient Stated Goal: decreased pain PT Goal Formulation: With patient Time For Goal Achievement: 05/12/17 Potential to Achieve Goals: Fair Progress towards PT  goals: Progressing toward goals    Frequency    Min 5X/week      PT Plan Current plan remains appropriate    Co-evaluation              AM-PAC PT "6 Clicks" Daily Activity  Outcome Measure  Difficulty turning over in bed (including adjusting bedclothes, sheets and blankets)?: Unable Difficulty moving from lying on back to sitting on the side of the bed? : Unable Difficulty sitting down on and standing up from a chair with arms (e.g., wheelchair, bedside commode, etc,.)?: Unable Help needed moving to and from a bed to chair (including a  wheelchair)?: A Little Help needed walking in hospital room?: A Little Help needed climbing 3-5 steps with a railing? : A Lot 6 Click Score: 11    End of Session Equipment Utilized During Treatment: Gait belt;Cervical collar Activity Tolerance: Patient tolerated treatment well;Patient limited by fatigue Patient left: in chair;with call bell/phone within reach;with family/visitor present Nurse Communication: Mobility status PT Visit Diagnosis: Other abnormalities of gait and mobility (R26.89);Pain Pain - Right/Left: Right Pain - part of body: Arm (neck)     Time: 5320-2334 PT Time Calculation (min) (ACUTE ONLY): 18 min  Charges:  $Gait Training: 8-22 mins                    G Codes:       Benjiman Core, [place] Pager [phone] Acute Rehab    Allena Katz 04/30/2017, 12:49 PM

## 2017-04-30 NOTE — Progress Notes (Signed)
Neurosurgery Progress Note  No issues overnight. Continues to have right shoulder pain.  No new concerns today  EXAM:  BP 131/68 (BP Location: Right Arm)   Pulse 70   Temp (!) 97.4 F (36.3 C) (Oral)   Resp 18   Ht 5\' 5"  (1.651 m)   Wt 95.8 kg (211 lb 3.2 oz)   SpO2 95%   BMI 35.15 kg/m   Awake, alert, oriented  Speech fluent, appropriate  MAEW Incision c/d/i  IMPRESSION/PLAN 83 y.o. female s/p C4-7 dorsal fixation and fusion. Doing well post-operatively Has right shoulder pain like related to injury. Increase gabapentin to 600mg  TID Remove drain CIR later today F./U 3 weeks outpt

## 2017-04-30 NOTE — Progress Notes (Signed)
Inpatient Rehabilitation  I have a bed available to offer patient today and plan to proceed with admission.  Please call with questions.   Carmelia Roller., CCC/SLP Admission Coordinator  [place]  Cell [phone]

## 2017-04-30 NOTE — Progress Notes (Signed)
Patient ID: Catherine Coleman, female   DOB: 1932-05-26, 83 y.o.   MRN: [id number] Patient admitted to 4W20 via wheelchair, escorted by nursing staff and family.  Patient and family verbalized understanding of rehab process.  No immediate distress noted.  Brita Romp, RN

## 2017-04-30 NOTE — Clinical Social Work Note (Deleted)
Per CIR pt now wants to go to CIR. CIR pending bed, hopefully today or Saturday. Clinical Social Worker will sign off for now as social work intervention is no longer needed. Please consult [place] again if new need arises.  [place], [place]

## 2017-04-30 NOTE — Progress Notes (Signed)
Report called to [place], RN on 4-W. Patient discharged to CIR by wheelchair accompanied by family.

## 2017-04-30 NOTE — Clinical Social Work Note (Signed)
Per CIR pt now wants to go to CIR. CIR pending bed, hopefully today or Saturday. Clinical Social Worker will sign off for now as social work intervention is no longer needed. Please consult [place] again if new need arises.  [place], [place]
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
## 2017-05-01 ENCOUNTER — Inpatient Hospital Stay (HOSPITAL_COMMUNITY): Payer: Medicare Other

## 2017-05-01 ENCOUNTER — Inpatient Hospital Stay (HOSPITAL_COMMUNITY): Payer: Medicare Other | Admitting: Physical Therapy

## 2017-05-01 DIAGNOSIS — S2220XS Unspecified fracture of sternum, sequela: Secondary | ICD-10-CM

## 2017-05-01 DIAGNOSIS — G8918 Other acute postprocedural pain: Secondary | ICD-10-CM

## 2017-05-01 LAB — URINALYSIS, ROUTINE W REFLEX MICROSCOPIC
Bilirubin Urine: NEGATIVE
Glucose, UA: NEGATIVE mg/dL
Hgb urine dipstick: NEGATIVE
KETONES UR: NEGATIVE mg/dL
Nitrite: NEGATIVE
Protein, ur: NEGATIVE mg/dL
SPECIFIC GRAVITY, URINE: 1.008 (ref 1.005–1.030)
pH: 8 (ref 5.0–8.0)

## 2017-05-01 LAB — CBC WITH DIFFERENTIAL/PLATELET
BASOS PCT: 0 %
Basophils Absolute: 0 10*3/uL (ref 0.0–0.1)
Eosinophils Absolute: 0.2 10*3/uL (ref 0.0–0.7)
Eosinophils Relative: 3 %
HEMATOCRIT: 38.1 % (ref 36.0–46.0)
HEMOGLOBIN: 12.5 g/dL (ref 12.0–15.0)
LYMPHS ABS: 1.1 10*3/uL (ref 0.7–4.0)
Lymphocytes Relative: 17 %
MCH: 31.5 pg (ref 26.0–34.0)
MCHC: 32.8 g/dL (ref 30.0–36.0)
MCV: 96 fL (ref 78.0–100.0)
MONOS PCT: 9 %
Monocytes Absolute: 0.6 10*3/uL (ref 0.1–1.0)
NEUTROS ABS: 4.5 10*3/uL (ref 1.7–7.7)
Neutrophils Relative %: 71 %
Platelets: 202 10*3/uL (ref 150–400)
RBC: 3.97 MIL/uL (ref 3.87–5.11)
RDW: 13.1 % (ref 11.5–15.5)
WBC: 6.4 10*3/uL (ref 4.0–10.5)

## 2017-05-01 LAB — COMPREHENSIVE METABOLIC PANEL
ALBUMIN: 3.3 g/dL — AB (ref 3.5–5.0)
ALK PHOS: 71 U/L (ref 38–126)
ALT: 28 U/L (ref 14–54)
AST: 39 U/L (ref 15–41)
Anion gap: 11 (ref 5–15)
BILIRUBIN TOTAL: 0.9 mg/dL (ref 0.3–1.2)
BUN: 8 mg/dL (ref 6–20)
CALCIUM: 8.7 mg/dL — AB (ref 8.9–10.3)
CO2: 27 mmol/L (ref 22–32)
CREATININE: 0.48 mg/dL (ref 0.44–1.00)
Chloride: 97 mmol/L — ABNORMAL LOW (ref 101–111)
GFR calc Af Amer: >60 mL/min (ref >60)
GFR calc non Af Amer: >60 mL/min (ref >60)
GLUCOSE: 121 mg/dL — AB (ref 65–99)
Potassium: 4.2 mmol/L (ref 3.5–5.1)
SODIUM: 135 mmol/L (ref 135–145)
TOTAL PROTEIN: 6.6 g/dL (ref 6.5–8.1)

## 2017-05-01 MED ORDER — METHOCARBAMOL 500 MG PO TABS
500.0000 mg | ORAL_TABLET | Freq: Four times a day (QID) | ORAL | Status: DC
  Administered 2017-05-01 – 2017-05-06 (×20): 500 mg via ORAL
  Filled 2017-05-01 (×20): qty 1
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
## 2017-05-02 ENCOUNTER — Inpatient Hospital Stay (HOSPITAL_COMMUNITY): Payer: No Typology Code available for payment source | Admitting: Physical Therapy

## 2017-05-02 DIAGNOSIS — N390 Urinary tract infection, site not specified: Secondary | ICD-10-CM

## 2017-05-02 DIAGNOSIS — R339 Retention of urine, unspecified: Secondary | ICD-10-CM

## 2017-05-02 DIAGNOSIS — A499 Bacterial infection, unspecified: Secondary | ICD-10-CM

## 2017-05-02 LAB — URINE CULTURE

## 2017-05-02 MED ORDER — AMOXICILLIN 250 MG PO CAPS
250.0000 mg | ORAL_CAPSULE | Freq: Three times a day (TID) | ORAL | Status: DC
  Administered 2017-05-02 – 2017-05-05 (×9): 250 mg via ORAL
  Filled 2017-05-02 (×9): qty 1

## 2017-05-02 MED ORDER — PHENAZOPYRIDINE HCL 100 MG PO TABS
100.0000 mg | ORAL_TABLET | Freq: Three times a day (TID) | ORAL | Status: AC
  Administered 2017-05-02 – 2017-05-05 (×9): 100 mg via ORAL
  Filled 2017-05-02 (×10): qty 1

## 2017-05-02 MED ORDER — FENTANYL 12 MCG/HR TD PT72
12.5000 ug | MEDICATED_PATCH | TRANSDERMAL | Status: DC
  Administered 2017-05-02 – 2017-05-05 (×2): 12.5 ug via TRANSDERMAL
  Filled 2017-05-02 (×3): qty 1

## 2017-05-02 MED ORDER — LIDOCAINE HCL 2 % EX GEL
CUTANEOUS | Status: DC | PRN
  Filled 2017-05-02: qty 5

## 2017-05-02 MED ORDER — BETHANECHOL CHLORIDE 10 MG PO TABS
10.0000 mg | ORAL_TABLET | Freq: Three times a day (TID) | ORAL | Status: DC
  Administered 2017-05-02 – 2017-05-04 (×6): 10 mg via ORAL
  Filled 2017-05-02 (×6): qty 1
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
## 2017-05-02 NOTE — Progress Notes (Signed)
Physical Therapy Session Note  Patient Details  Name: Catherine Coleman MRN: [id number] Date of Birth: [birth year]  Today's Date: 05/02/2017 PT Individual Time: 2671-2458 PT Individual Time Calculation (min): 30 min   Short Term Goals: Week 1:  PT Short Term Goal 1 (Week 1): Pt will ambulate 150 ft with RW and Supervision PT Short Term Goal 2 (Week 1): Pt will perform sit to stand with RW with supervision PT Short Term Goal 3 (Week 1): Pt will perform bed to chair with RW with supervision  Skilled Therapeutic Interventions/Progress Updates:  Pt was seen bedside in the am. Pt transferred supine to edge of bed with siderail, S and increased time. Pt performed multiple sit to stand and stand pivot transfers with rolling walker and min guard. Pt ambulated 70 feet x 2, 120 feet and 40 feet with rolling walker and min guard. Pt returned to room following treatment and left sitting up in recliner with all needs within reach.   Therapy Documentation Precautions:  Precautions Precautions: Fall, Cervical Precaution Comments:  (Able to recall all precaution) Required Braces or Orthoses: Cervical Brace Cervical Brace: Hard collar Restrictions Weight Bearing Restrictions: No General:   Pain: Pt c/o 6/10 pain R shoulder and UE.   See Function Navigator for Current Functional Status.   Therapy/Group: Individual Therapy  Dub Amis 05/02/2017, 12:26 PM

## 2017-05-03 ENCOUNTER — Inpatient Hospital Stay (HOSPITAL_COMMUNITY): Payer: Medicare Other | Admitting: Occupational Therapy

## 2017-05-03 ENCOUNTER — Inpatient Hospital Stay (HOSPITAL_COMMUNITY): Payer: No Typology Code available for payment source | Admitting: Occupational Therapy

## 2017-05-03 ENCOUNTER — Inpatient Hospital Stay (HOSPITAL_COMMUNITY): Payer: Medicare Other | Admitting: Physical Therapy

## 2017-05-03 ENCOUNTER — Inpatient Hospital Stay (HOSPITAL_COMMUNITY): Payer: No Typology Code available for payment source

## 2017-05-03 MED ORDER — DM-GUAIFENESIN ER 30-600 MG PO TB12
1.0000 | ORAL_TABLET | Freq: Two times a day (BID) | ORAL | Status: DC
  Administered 2017-05-03 – 2017-05-06 (×7): 1 via ORAL
  Filled 2017-05-03 (×6): qty 1

## 2017-05-03 NOTE — Progress Notes (Signed)
Patient information reviewed and entered into [facility] System by Becky Duong Haydel, covering PPS coordinator. Information including medical coding and functional independence measure will be reviewed and updated through discharge.  Per nursing, patient was given "Data Collection Information Summary for Patients in Inpatient Rehabilitation Facilities with attached Privacy Act Statement Health Care Records" upon admission.     

## 2017-05-03 NOTE — Progress Notes (Signed)
Physical Therapy Session Note  Patient Details  Name: Catherine Coleman MRN: [id number] Date of Birth: 11/13/1931  Today's Date: 05/03/2017 PT Individual Time: 1330-1400 PT Individual Time Calculation (min): 30 min   Short Term Goals: Week 1:  PT Short Term Goal 1 (Week 1): Pt will ambulate 150 ft with RW and Supervision PT Short Term Goal 2 (Week 1): Pt will perform sit to stand with RW with supervision PT Short Term Goal 3 (Week 1): Pt will perform bed to chair with RW with supervision  Skilled Therapeutic Interventions/Progress Updates: Pt presented in recliner agreeable to therapy with c/o increased fatigue this pm. Pt request to use toilet, performed recliner and toilet transfers with supervision. Pt expressing anxiety regarding d/c planning, provided emotional support. Pt agreeable to work on bed mobility. Performed sit to supine supervision but with increased effort. Performed supine to sit with supervision and use of log roll technique. Recommended use of LUE to assist with push off from mattress with improved results per pt. Pt performed x2 sit to/from supine with decreased effort per pt. Pt remained in bed at end of session with call bell within reach and current needs met.      Therapy Documentation Precautions:  Precautions Precautions: Fall, Cervical Precaution Comments:  (Able to recall all precaution) Required Braces or Orthoses: Cervical Brace Cervical Brace: Hard collar Restrictions Weight Bearing Restrictions: No     See Function Navigator for Current Functional Status.   Therapy/Group: Individual Therapy  Cevin Rubinstein  Kseniya Grunden, PTA  05/03/2017, 3:54 PM
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
## 2017-05-03 NOTE — Plan of Care (Signed)
Problem: RH Stairs Goal: LTG Patient will ambulate up and down stairs w/assist (PT) LTG: Patient will ambulate up and down # of stairs with assistance (PT)  Upgraded ABG
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
## 2017-05-03 NOTE — Progress Notes (Addendum)
Physical Therapy Session Note  Patient Details  Name: Catherine Coleman MRN: [id number] Date of Birth: 01/29/1932  Today's Date: 05/03/2017 PT Individual Time: 1055-1150 PT Individual Time Calculation (min): 55 min   Short Term Goals: Week 1:  PT Short Term Goal 1 (Week 1): Pt will ambulate 150 ft with RW and Supervision PT Short Term Goal 2 (Week 1): Pt will perform sit to stand with RW with supervision PT Short Term Goal 3 (Week 1): Pt will perform bed to chair with RW with supervision  Skilled Therapeutic Interventions/Progress Updates:    Session very limited by pain in neck and UE's - attempted various functional activities to address balance, endurance, and functional mobility but pt very limited in completion. Education and practice with bed mobility per pt request and educated on use of remote control in the hospital bed to help with repositioning and use of bedrails. Performed functional transfers with RW during session with steadying assist and verbal cues for hand placement and positioning of RW. Short distance w/c propulsion for BUE strengthening and coordination - limited by pain. PT added basic back to w/c to improve sitting tolerance and upright posture. Pt reports balance being an issue - attempted to engage in dynamic standing balance and weightshifting activities for alternating toe taps with decreasing UE support but again pt limited activity to due to pain and request to return to room. Encouraged and agreeable to attempt seated activity on Nustep with BLE only for functional strengthening and overall endurance x 5 min on level 2. Gait training with RW back to pt room with steadying assist during turns for balance demonstrating decreased gait speed. Positioned in recliner to promote OOB tolerance especially for meals. Engaged in d/c planning discussion and education in regards to home environment set-up, return to functional activities, as well as role of PT/OT on CIR and goals. Pt  apologetic about limited performance this session.   Addendum: Pt also engaged in use of AE to change pants from seated position. Steadying assist for standing balance without UE support while pulling pants up/down. Pt able to adhere to precautions.   Therapy Documentation Precautions:  Precautions Precautions: Fall, Cervical Precaution Comments:  (Able to recall all precaution) Required Braces or Orthoses: Cervical Brace Cervical Brace: Hard collar Restrictions Weight Bearing Restrictions: No    Pain: Pain Assessment Pain Assessment: 0-10 Pain Score: 8  Pain Type: Surgical pain Pain Location: Neck Pain Orientation: Posterior Pain Descriptors / Indicators: Aching Pain Onset: Progressive Patients Stated Pain Goal: 0 Pain Intervention(s): Medication (See eMAR)    See Function Navigator for Current Functional Status.   Therapy/Group: Individual Therapy  Canary Brim Ivory Broad, PT, DPT  05/03/2017, 11:59 AM

## 2017-05-03 NOTE — Progress Notes (Signed)
Occupational Therapy Session Note  Patient Details  Name: Catherine Coleman MRN: [id number] Date of Birth: 1931/09/24  Today's Date: 05/03/2017 OT Individual Time: 0900-1000 OT Individual Time Calculation (min): 60 min    Short Term Goals: Week 1:  OT Short Term Goal 1 (Week 1): STG=LTG 2/2 ELOS  Skilled Therapeutic Interventions/Progress Updates:    Pt received in bed stating she was in 9/10 pain. RN provided pain meds.  For the first 30 min, prior to pt getting out of bed to allow pain meds to take effect: -adjusted c collar as the back of collar was on upside down and digging into back of her skull -demonstrated how to replace collar pads and washed out current pads as they were soiled -demonstrated and adjusted pillows to provide better support to arms. 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 She sat to EOB, stood to RW and ambulated to and from bathroom all with steadying A. Steadying A with toileting/ cleansing.  Pt sat in w/c to don underwear and pants with reacher with verbal cues.  She opted to not wear bra, but discussed donning strategies.  Pt requested to lay down again as she had an hour break before next therapy session. Pt in bed with all needs met.       Therapy Documentation Precautions:  Precautions Precautions: Fall, Cervical Precaution Comments:  (Able to recall all precaution) Required Braces or Orthoses: Cervical Brace Cervical Brace: Hard collar Restrictions Weight Bearing Restrictions: No Pain: Pain Assessment Pain Assessment: 0-10 Pain Score: 8  Faces Pain Scale: Hurts even more Pain Type: Surgical pain Pain Location: Neck Pain Orientation: Posterior Pain Descriptors / Indicators:  Aching Pain Onset: Progressive Patients Stated Pain Goal: 0 Pain Intervention(s): Medication (See eMAR)   See Function Navigator for Current Functional Status.  Therapy/Group: Individual Therapy  [place] 05/03/2017, 11:52 AM

## 2017-05-03 NOTE — Care Management Note (Signed)
[facility] Individual Statement of Services  Patient Name:  Catherine Coleman  Date:  05/03/2017  Welcome to the [facility].  Our goal is to provide you with an individualized program based on your diagnosis and situation, designed to meet your specific needs.  With this comprehensive rehabilitation program, you will be expected to participate in at least 3 hours of rehabilitation therapies Monday-Friday, with modified therapy programming on the weekends.  Your rehabilitation program will include the following services:  Physical Therapy (PT), Occupational Therapy (OT), 24 hour per day rehabilitation nursing, Neuropsychology, Case Management (Social Worker), Rehabilitation Medicine, Nutrition Services and Pharmacy Services  Weekly team conferences will be held on Tuesdays to discuss your progress.  Your Social Worker will talk with you frequently to get your input and to update you on team discussions.  Team conferences with you and your family in attendance may also be held.  Expected length of stay: 7-10 days Overall anticipated outcome: modified independent  Depending on your progress and recovery, your program may change. Your Social Worker will coordinate services and will keep you informed of any changes. Your Social Worker's name and contact numbers are listed  below.  The following services may also be recommended but are not provided by the [facility] will be made to provide these services after discharge if needed.  Arrangements include referral to agencies that provide these services.  Your insurance has been verified to be:  Medicare and [place] Your primary doctor is:  Gwyneth Revels  Pertinent information will be shared with your doctor and your insurance company.  Social Worker:  [place], [place] or ([phone]   Information discussed with and copy given to patient by: Lennart Pall, 05/03/2017, 4:05 PM

## 2017-05-03 NOTE — Progress Notes (Signed)
Occupational Therapy Session Note  Patient Details  Name: Catherine Coleman MRN: [id number] Date of Birth: 06-13-1932  Today's Date: 05/03/2017 OT Individual Time: 1445-1530 OT Individual Time Calculation (min): 45 min    Short Term Goals: Week 1:  OT Short Term Goal 1 (Week 1): STG=LTG 2/2 ELOS  Skilled Therapeutic Interventions/Progress Updates:    Treatment session focused on ADL/self care training, transfer training, pt education, energy conservation techniques, and AE training. Upon entering, pt supine in bed resting with cervical collar in place. Pt agreeable to ADL tasks and requested to use bathroom and to practice don/doffing socks and shoes. Pt moved from supine lying to EOB sitting with mod A for UB guiding and stated "This was easier earlier today." Pt transferred from EOB sit to stand with walker in place and walked to toilet with min A for steadiness. Pt completed toileting task with close guard assistance. Pt educated on safe transfers using grab bars. After toileting task pt ambulated to dresser drawer to pick out socks with CGA. Pt sat in recliner chair and required max A for combing her hair. Next, pt was provided with instructions and demo on AE for don/dof socks and shoes. Pt was provided with sock aid for use while in rehab. Pt notified that AE for home would not be supplied to her at discharge. Pt demo'ed use of sock aid with min A and required assistance with putting shoes on. Pt transferred to bed with walker with  Min A and from sit to lying with min A. She repositioned self in bed and was left to rest comfortably. Pt c/o pain in her neck with activity but noted to have less pain when laying in bed.    Therapy Documentation Precautions:  Precautions Precautions: Fall, Cervical Precaution Comments:  (Able to recall all precaution) Required Braces or Orthoses: Cervical Brace Cervical Brace: Hard collar Restrictions Weight Bearing Restrictions: No Pain: Pain  Assessment Pain Score: 4  Pain Type: Surgical pain Pain Location: Neck Pain Orientation: Posterior Pain Descriptors / Indicators: Aching Pain Onset: With Activity Patients Stated Pain Goal: 0 Pain Intervention(s): Repositioned Multiple Pain Sites: No  See Function Navigator for Current Functional Status.   Therapy/Group: Individual Therapy  Delon Sacramento 05/03/2017, 4:02 PM

## 2017-05-04 ENCOUNTER — Inpatient Hospital Stay (HOSPITAL_COMMUNITY): Payer: Medicare Other

## 2017-05-04 ENCOUNTER — Inpatient Hospital Stay (HOSPITAL_COMMUNITY): Payer: Medicare Other | Admitting: Physical Therapy

## 2017-05-04 DIAGNOSIS — N319 Neuromuscular dysfunction of bladder, unspecified: Secondary | ICD-10-CM

## 2017-05-04 DIAGNOSIS — M791 Myalgia: Secondary | ICD-10-CM

## 2017-05-04 MED ORDER — LIDOCAINE 5 % EX PTCH
1.0000 | MEDICATED_PATCH | CUTANEOUS | Status: DC
  Administered 2017-05-04 – 2017-05-06 (×3): 1 via TRANSDERMAL
  Filled 2017-05-04 (×3): qty 1

## 2017-05-04 MED ORDER — GABAPENTIN 400 MG PO CAPS
400.0000 mg | ORAL_CAPSULE | Freq: Three times a day (TID) | ORAL | Status: DC
  Administered 2017-05-04 – 2017-05-06 (×6): 400 mg via ORAL
  Filled 2017-05-04 (×6): qty 1
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
## 2017-05-04 NOTE — Progress Notes (Signed)
Occupational Therapy Session Note  Patient Details  Name: Catherine Coleman MRN: [id number] Date of Birth: Jan 30, 1932  Today's Date: 05/04/2017 OT Individual Time: 1100-1200 OT Individual Time Calculation (min): 60 min    Short Term Goals: Week 1:  OT Short Term Goal 1 (Week 1): STG=LTG 2/2 ELOS  Skilled Therapeutic Interventions/Progress Updates:    1:1 SElf care retraining including functional mobility around the room with and without RW, standing balance, shower and toilet transfers, bed mobility and activity tolerance. Pt required steadying A throughout session for dynamic balance with and without UE support. Pt education provided on changing c collar neck pads with showering. Pt able to sit on EOB and dressing with occasional steadying A for standing. Assisted pt with drying and fixing hair.  Left resting in recliner at end of session.  Therapy Documentation Precautions:  Precautions Precautions: Fall, Cervical Precaution Comments:  (Able to recall all precaution) Required Braces or Orthoses: Cervical Brace Cervical Brace: Hard collar Restrictions Weight Bearing Restrictions: No Pain: No c/o pain in this session  See Function Navigator for Current Functional Status.   Therapy/Group: Individual Therapy  Willeen Cass [facility] 05/04/2017, 5:51 PM

## 2017-05-04 NOTE — Progress Notes (Signed)
Physical Therapy Session Note  Patient Details  Name: Catherine Coleman MRN: [id number] Date of Birth: 09/12/1932  Today's Date: 05/04/2017 PT Individual Time: 7408-1448 PT Individual Time Calculation (min): 55 min   Short Term Goals: Week 1:  PT Short Term Goal 1 (Week 1): Pt will ambulate 150 ft with RW and Supervision PT Short Term Goal 2 (Week 1): Pt will perform sit to stand with RW with supervision PT Short Term Goal 3 (Week 1): Pt will perform bed to chair with RW with supervision  Skilled Therapeutic Interventions/Progress Updates: Pt received in recliner with no c/o pain and agreeable to treatment. Pt reports using RUE less during functional tasks today and feels it has helped R shoulder pain. Gait in/out of bathroom with RW and S; S for dynamic standing balance while pt managing clothing and performing hygiene. Transported to gym totalA for energy conservation per request of patient. Berg assessed as below. Patient demonstrates increased fall risk as noted by score of  27/56 on Berg Balance Scale.  (<36= high risk for falls, close to 100%; 37-45 significant >80%; 46-51 moderate >50%; 52-55 lower >25%); however 2 items not tested d/t orders for back precautions therefore max total score 48, still suggestive of high risk for falls. Static and dynamic standing balance on airex foam pad for focus on ankle strategy, standing tolerance and endurance; dynamic tasks performed in standing with BUE pipe tree with normal BOS and staggered stance with S overall. Gait to return to room, no AD and min guard. Remained seated in recliner at end of session, daughter present and all needs in reach.       Therapy Documentation Precautions:  Precautions Precautions: Fall, Cervical Precaution Comments:  (Able to recall all precaution) Required Braces or Orthoses: Cervical Brace Cervical Brace: Hard collar Restrictions Weight Bearing Restrictions: No Balance: Balance Balance Assessed: Yes Standardized  Balance Assessment Standardized Balance Assessment: Berg Balance Test Berg Balance Test Sit to Stand: Able to stand  independently using hands Standing Unsupported: Able to stand safely 2 minutes Sitting with Back Unsupported but Feet Supported on Floor or Stool: Able to sit safely and securely 2 minutes Stand to Sit: Controls descent by using hands Transfers: Able to transfer with verbal cueing and /or supervision Standing Unsupported with Eyes Closed: Able to stand 10 seconds with supervision Standing Ubsupported with Feet Together: Able to place feet together independently and stand for 1 minute with supervision From Standing, Reach Forward with Outstretched Arm: Can reach forward >12 cm safely (5") From Standing Position, Pick up Object from Floor: Unable to try/needs assist to keep balance (back precautions) From Standing Position, Turn to Look Behind Over each Shoulder: Needs assist to keep from losing balance and falling (back precautions) Turn 360 Degrees: Able to turn 360 degrees safely but slowly Standing Unsupported, Alternately Place Feet on Step/Stool: Needs assistance to keep from falling or unable to try Standing Unsupported, One Foot in Front: Loses balance while stepping or standing Standing on One Leg: Unable to try or needs assist to prevent fall Total Score: 27   See Function Navigator for Current Functional Status.   Therapy/Group: Individual Therapy  Luberta Mutter 05/04/2017, 2:06 PM

## 2017-05-04 NOTE — Progress Notes (Signed)
Physical Therapy Session Note  Patient Details  Name: Catherine Coleman MRN: [id number] Date of Birth: [birth year]  Today's Date: 05/04/2017 PT Individual Time: 0930-1030 PT Individual Time Calculation (min): 60 min   Pt missed 15 minutes of therapy tx secondary to migraine.   Short Term Goals: Week 1:  PT Short Term Goal 1 (Week 1): Pt will ambulate 150 ft with RW and Supervision PT Short Term Goal 2 (Week 1): Pt will perform sit to stand with RW with supervision PT Short Term Goal 3 (Week 1): Pt will perform bed to chair with RW with supervision  Skilled Therapeutic Interventions/Progress Updates:    PT sitting in recliner upon PT arrival, agreeable to therapy tx. Pt denies pain when sitting in recliner, reports pain 5/10 in R shoulder when up and moving. Pt doffed night gown and donned pants/shirt with supervision and verbal cues for technique. Pt ambulated x 80ft to the bathroom using RW and min assist. Pt working on dynamic standing balance with UE support to doff/don pants and to clean peri area after toileting. Pt performed sit<>stand from toilet with min assist using RW and ambulated back to recliner. Pt reported getting a visual migraine and requested to hold off on therapy for a few minutes until it faded, RN notified. Pt missed 15 minutes of therapy tx.  After migraine resolved, pt ambulated x 100 ft to the gym using RW and min assist. Pt reported having difficulty with getting in and out of bed. Session focused on practice of bed mobility. Pt performed sitting<>sidelying<>supine technique to get on/off mat x 3 trials with min assist to supervision on the last trial, verbal and tactile cues for technique. Pt reported pain in R shoulder over the past few days, PT noted increased tenderness to palpation to supraspinatus and infraspinatus areas with pain during PROM shoulder flexion and abduction. Pt ambulated back to room x 100 ft with RW and min assist. Pt left seated in recliner with needs  in reach.   Therapy Documentation Precautions:  Precautions Precautions: Fall, Cervical Precaution Comments:  (Able to recall all precaution) Required Braces or Orthoses: Cervical Brace Cervical Brace: Hard collar Restrictions Weight Bearing Restrictions: No   See Function Navigator for Current Functional Status.   Therapy/Group: Individual Therapy  Netta Corrigan, PT, DPT 05/04/2017, 9:37 AM

## 2017-05-04 NOTE — Discharge Instructions (Signed)
Inpatient Rehab Discharge Instructions  Catherine Coleman Discharge date and time: No discharge date for patient encounter.   Activities/Precautions/ Functional Status: Activity: cervical collar Diet: regular diet Wound Care: none needed Functional status:  ___ No restrictions     ___ Walk up steps independently ___ 24/7 supervision/assistance   ___ Walk up steps with assistance ___ Intermittent supervision/assistance  ___ Bathe/dress independently ___ Walk with walker     _x__ Bathe/dress with assistance ___ Walk Independently    ___ Shower independently ___ Walk with assistance    ___ Shower with assistance ___ No alcohol     ___ Return to work/school ________  Special Instructions:  Driving  My questions have been answered and I understand these instructions. I will adhere to these goals and the provided educational materials after my discharge from the hospital.  Patient/Caregiver Signature _______________________________ Date __________  Clinician Signature _______________________________________ Date __________  Please bring this form and your medication list with you to all your follow-up doctor's appointments.

## 2017-05-05 ENCOUNTER — Inpatient Hospital Stay (HOSPITAL_COMMUNITY): Payer: No Typology Code available for payment source | Admitting: Physical Therapy

## 2017-05-05 ENCOUNTER — Inpatient Hospital Stay (HOSPITAL_COMMUNITY): Payer: Medicare Other | Admitting: Occupational Therapy

## 2017-05-05 ENCOUNTER — Inpatient Hospital Stay (HOSPITAL_COMMUNITY): Payer: Medicare Other | Admitting: Physical Therapy

## 2017-05-05 LAB — URINE CULTURE

## 2017-05-05 MED ORDER — LEVOTHYROXINE SODIUM 100 MCG PO TABS: 100.0000 ug | ORAL_TABLET | Freq: Every day | ORAL | 0 refills | Status: DC

## 2017-05-05 MED ORDER — WHITE PETROLATUM GEL
Status: AC
  Administered 2017-05-05: 14:00:00
  Filled 2017-05-05: qty 1

## 2017-05-05 MED ORDER — TAMSULOSIN HCL 0.4 MG PO CAPS: 0.4000 mg | ORAL_CAPSULE | Freq: Every day | ORAL | 0 refills | Status: DC

## 2017-05-05 MED ORDER — LIDOCAINE 5 % EX PTCH: 1.0000 | MEDICATED_PATCH | CUTANEOUS | 0 refills | Status: DC

## 2017-05-05 MED ORDER — POLYETHYLENE GLYCOL 3350 17 G PO PACK: 17.0000 g | PACK | Freq: Every day | ORAL | 0 refills | Status: DC

## 2017-05-05 MED ORDER — DOCUSATE SODIUM 100 MG PO CAPS: 100.0000 mg | ORAL_CAPSULE | Freq: Two times a day (BID) | ORAL | 0 refills | Status: DC

## 2017-05-05 MED ORDER — OXYCODONE HCL 5 MG PO TABS: 5.0000 mg | ORAL_TABLET | ORAL | 0 refills | Status: DC | PRN

## 2017-05-05 MED ORDER — BRIMONIDINE TARTRATE 0.2 % OP SOLN: 1.0000 [drp] | Freq: Two times a day (BID) | OPHTHALMIC | 12 refills | Status: AC

## 2017-05-05 MED ORDER — LOSARTAN POTASSIUM 50 MG PO TABS: 50.0000 mg | ORAL_TABLET | Freq: Every day | ORAL | 0 refills | Status: DC

## 2017-05-05 MED ORDER — LORAZEPAM 0.5 MG PO TABS: 0.5000 mg | ORAL_TABLET | Freq: Two times a day (BID) | ORAL | 0 refills | Status: DC | PRN

## 2017-05-05 MED ORDER — GABAPENTIN 400 MG PO CAPS: 400.0000 mg | ORAL_CAPSULE | Freq: Three times a day (TID) | ORAL | 0 refills | Status: DC

## 2017-05-05 MED ORDER — FENTANYL 12 MCG/HR TD PT72: 12.5000 ug | MEDICATED_PATCH | TRANSDERMAL | 0 refills | Status: DC

## 2017-05-05 MED ORDER — SULFAMETHOXAZOLE-TRIMETHOPRIM 400-80 MG PO TABS
1.0000 | ORAL_TABLET | Freq: Two times a day (BID) | ORAL | Status: DC
  Administered 2017-05-05 – 2017-05-06 (×2): 1 via ORAL
  Filled 2017-05-05 (×3): qty 1

## 2017-05-05 MED ORDER — VITAMIN D 50 MCG (2000 UT) PO CAPS: 2000.0000 [IU] | ORAL_CAPSULE | Freq: Every day | ORAL | 5 refills | Status: DC

## 2017-05-05 MED ORDER — METHOCARBAMOL 500 MG PO TABS: 500.0000 mg | ORAL_TABLET | Freq: Four times a day (QID) | ORAL | 0 refills | Status: DC

## 2017-05-05 MED ORDER — CYANOCOBALAMIN 2500 MCG SL SUBL: 2500.0000 ug | SUBLINGUAL_TABLET | Freq: Every day | SUBLINGUAL | 0 refills | Status: DC

## 2017-05-05 MED ORDER — SULFAMETHOXAZOLE-TRIMETHOPRIM 400-80 MG PO TABS: 1.0000 | ORAL_TABLET | Freq: Two times a day (BID) | ORAL | 0 refills | Status: DC

## 2017-05-05 MED ORDER — OMEPRAZOLE 20 MG PO CPDR: 20.0000 mg | DELAYED_RELEASE_CAPSULE | Freq: Every day | ORAL | 0 refills | Status: DC

## 2017-05-05 NOTE — Progress Notes (Addendum)
Physical Therapy Session Note  Patient Details  Name: Catherine Coleman MRN: [id number] Date of Birth: 1932/04/06  Today's Date: 05/05/2017 PT Individual Time: 1108-1202 PT Individual Time Calculation (min): 54 min   Short Term Goals: Week 1:  PT Short Term Goal 1 (Week 1): Pt will ambulate 150 ft with RW and Supervision PT Short Term Goal 2 (Week 1): Pt will perform sit to stand with RW with supervision PT Short Term Goal 3 (Week 1): Pt will perform bed to chair with RW with supervision  Skilled Therapeutic Interventions/Progress Updates:  Pt received in recliner & agreeable to tx, noting 4/10 pain in neck at rest. Session focused on functional mobility, dynamic balance during gait, and BLE strengthening. Pt ambulates throughout unit without AD & steady assist for balance. Pt completes car transfer at lowest height with supervision and instructional cuing for hand placement. Pt completes bed mobility from mat table with supervision. Pt utilized cybex kinetron in sitting then standing with BUE or 1UE support with task focusing on weight shifting and strengthening. High level balance training with pt ambulating backwards without AD & min assist. Pt completes 5x sit<>stand with BUE support for strengthening. At end of session pt left sitting in recliner with BLE elevated & all needs within reach.   Addendum: Pt reported dizziness with supine>sitting and after leaning over to adjust shoes but reports it resolved quickly with rest.   Therapy Documentation Precautions:  Precautions Precautions: Fall, Cervical Precaution Comments:  (Able to recall all precaution) Required Braces or Orthoses: Cervical Brace Cervical Brace: Hard collar Restrictions Weight Bearing Restrictions: No    See Function Navigator for Current Functional Status.   Therapy/Group: Individual Therapy  Waunita Schooner 05/05/2017, 12:18 PM

## 2017-05-05 NOTE — Progress Notes (Signed)
Social Work Patient ID: Catherine Coleman, female   DOB: 05-01-1932, 83 y.o.   MRN: [id number]   Met with pt and daughter yesterday afternoon to review team conference.  Both aware team has targeted d/c date of 8/23 with plan to d/c to SNF at [facility].  Able to confirm with [facility] this morning that they CAN admit her tomorrow.  Daughter plans to provide d/c transportation.  Team aware.  Kiesha Ensey, LCSW

## 2017-05-05 NOTE — NC FL2 (Signed)
[place] LEVEL OF CARE SCREENING TOOL     IDENTIFICATION  Patient Name: Catherine Coleman Birthdate: [birth year] Sex: female Admission Date (Current Location): 04/30/2017  [facility] and [place] Number:  Herbalist and Address:  [facility] [place], [place] [address], [place], [place] [address]      Provider Number: [id number]  Attending Physician Name and Address:  Meredith Staggers, MD  Relative Name and Phone Number:       Current Level of Care: Other (Comment) (Acute Inpatient Rehabilitation) Recommended Level of Care: [place] Prior Approval Number:    Date Approved/Denied:   PASRR Number: [id number]  Discharge Plan: SNF    Current Diagnoses: Patient Active Problem List   Diagnosis Date Noted  . Closed dislocation cervical spine, initial encounter 04/30/2017  . Sternal fracture 04/30/2017  . Cervical spine fracture ([place]) 04/26/2017  . Parotiditis 04/21/2016  . Breast cancer screening 04/21/2016  . Atrophic rhinitis 04/21/2016  . Obesity 04/02/2016  . Diverticulosis of colon without hemorrhage 04/02/2016  . Vitamin D deficiency 04/02/2016  . Hyperlipidemia, mild 04/02/2016  . Hypertension   . Arthritis   . Hiatal hernia with gastroesophageal reflux   . Glaucoma   . Hypothyroidism   . Primary osteoarthritis of right hip 03/22/2015    Orientation RESPIRATION BLADDER Height & Weight     Self, Time, Situation, Place  Normal Continent Weight:   Height:  5\' 5"  (165.1 cm)  BEHAVIORAL SYMPTOMS/MOOD NEUROLOGICAL BOWEL NUTRITION STATUS      Continent    AMBULATORY STATUS COMMUNICATION OF NEEDS Skin   Limited Assist Verbally Surgical wounds                       Personal Care Assistance Level of Assistance    Bathing Assistance: Limited assistance Feeding assistance: Independent Dressing Assistance: Limited assistance     Functional Limitations Info             SPECIAL CARE FACTORS FREQUENCY  PT  (By licensed PT), OT (By licensed OT)     PT Frequency: 5x/wk OT Frequency: 5x/wk            Contractures Contractures Info: Not present    Additional Factors Info    Code Status Info: Full             Current Medications (05/05/2017):  This is the current hospital active medication list Current Facility-Administered Medications  Medication Dose Route Frequency Provider Last Rate Last Dose  . acetaminophen (TYLENOL) tablet 325-650 mg  325-650 mg Oral Q4H PRN Bary Leriche, PA-C   650 mg at 05/04/17 2233  . alum & mag hydroxide-simeth (MAALOX/MYLANTA) 200-200-20 MG/5ML suspension 30 mL  30 mL Oral Q4H PRN Love, Pamela S, PA-C      . amoxicillin (AMOXIL) capsule 250 mg  250 mg Oral Q8H Meredith Staggers, MD   250 mg at 05/05/17 [phone]  . aspirin tablet 325 mg  325 mg Oral Daily Bary Leriche, PA-C   325 mg at 05/05/17 7782  . bisacodyl (DULCOLAX) suppository 10 mg  10 mg Rectal Daily PRN Love, Pamela S, PA-C      . brimonidine (ALPHAGAN) 0.2 % ophthalmic solution 1 drop  1 drop Both Eyes BID Bary Leriche, PA-C   1 drop at 05/04/17 0758  . brimonidine (ALPHAGAN) 0.2 % ophthalmic solution 1 drop  1 drop Both Eyes BID Love, Pamela S, PA-C   1 drop at  05/05/17 0734  . dextromethorphan-guaiFENesin (MUCINEX DM) 30-600 MG per 12 hr tablet 1 tablet  1 tablet Oral BID Meredith Staggers, MD   1 tablet at 05/05/17 0751  . diphenhydrAMINE (BENADRYL) 12.5 MG/5ML elixir 12.5-25 mg  12.5-25 mg Oral Q6H PRN Love, Pamela S, PA-C      . docusate sodium (COLACE) capsule 100 mg  100 mg Oral BID Reesa Chew S, PA-C   100 mg at 05/05/17 5053  . enoxaparin (LOVENOX) injection 40 mg  40 mg Subcutaneous Q24H Reesa Chew S, PA-C   40 mg at 05/05/17 0731  . fentaNYL (DURAGESIC - dosed mcg/hr) 12.5 mcg  12.5 mcg Transdermal Q72H Meredith Staggers, MD   12.5 mcg at 05/02/17 1224  . gabapentin (NEURONTIN) capsule 400 mg  400 mg Oral TID Meredith Staggers, MD   400 mg at 05/05/17 9767  .  guaiFENesin-dextromethorphan (ROBITUSSIN DM) 100-10 MG/5ML syrup 5-10 mL  5-10 mL Oral Q6H PRN Bary Leriche, PA-C   10 mL at 05/03/17 0759  . levothyroxine (SYNTHROID, LEVOTHROID) tablet 100 mcg  100 mcg Oral QAC breakfast Bary Leriche, PA-C   100 mcg at 05/05/17 3419  . lidocaine (LIDODERM) 5 % 1 patch  1 patch Transdermal Q24H Meredith Staggers, MD   1 patch at 05/04/17 1412  . lidocaine (XYLOCAINE) 2 % jelly   Topical PRN Meredith Staggers, MD      . LORazepam (ATIVAN) tablet 0.5 mg  0.5 mg Oral BID PRN Love, Pamela S, PA-C      . losartan (COZAAR) tablet 50 mg  50 mg Oral Daily Bary Leriche, PA-C   50 mg at 05/05/17 3790  . menthol-cetylpyridinium (CEPACOL) lozenge 3 mg  1 lozenge Oral PRN Love, Pamela S, PA-C       Or  . phenol (CHLORASEPTIC) mouth spray 1 spray  1 spray Mouth/Throat PRN Love, Pamela S, PA-C      . methocarbamol (ROBAXIN) tablet 500 mg  500 mg Oral QID Meredith Staggers, MD   500 mg at 05/05/17 2409  . multivitamin with minerals tablet 1 tablet  1 tablet Oral Daily Bary Leriche, PA-C   1 tablet at 05/05/17 7353  . oxyCODONE (Oxy IR/ROXICODONE) immediate release tablet 5-10 mg  5-10 mg Oral Q4H PRN Bary Leriche, PA-C   10 mg at 05/05/17 2992  . pantoprazole (PROTONIX) EC tablet 40 mg  40 mg Oral Daily Bary Leriche, PA-C   40 mg at 05/05/17 4268  . polyethylene glycol (MIRALAX / GLYCOLAX) packet 17 g  17 g Oral Daily PRN Love, Pamela S, PA-C      . polyethylene glycol (MIRALAX / GLYCOLAX) packet 17 g  17 g Oral BID Bary Leriche, PA-C   17 g at 05/04/17 2233  . prochlorperazine (COMPAZINE) tablet 5-10 mg  5-10 mg Oral Q6H PRN Love, Pamela S, PA-C       Or  . prochlorperazine (COMPAZINE) injection 5-10 mg  5-10 mg Intramuscular Q6H PRN Love, Pamela S, PA-C       Or  . prochlorperazine (COMPAZINE) suppository 12.5 mg  12.5 mg Rectal Q6H PRN Love, Pamela S, PA-C      . sodium phosphate (FLEET) 7-19 GM/118ML enema 1 enema  1 enema Rectal Once PRN Love, Pamela S, PA-C       . tamsulosin (FLOMAX) capsule 0.4 mg  0.4 mg Oral QPC breakfast Reesa Chew S, PA-C   0.4 mg at 05/05/17 0751  .  traMADol (ULTRAM) tablet 50 mg  50 mg Oral Q6H PRN Bary Leriche, PA-C   50 mg at 05/03/17 1026  . traZODone (DESYREL) tablet 25-50 mg  25-50 mg Oral QHS PRN Bary Leriche, PA-C   50 mg at 05/04/17 2232  . vitamin B-12 (CYANOCOBALAMIN) tablet 2,500 mcg  2,500 mcg Oral Daily Bary Leriche, PA-C   2,500 mcg at 05/05/17 6811     Discharge Medications: Please see discharge summary for a list of discharge medications.  Relevant Imaging Results:  Relevant Lab Results:   Additional Information SSN: [id number]  Lennart Pall, LCSW
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
## 2017-05-05 NOTE — Progress Notes (Signed)
Physical Therapy Session Note  Patient Details  Name: Catherine Coleman MRN: [id number] Date of Birth: 1932-08-24  Today's Date: 05/05/2017 PT Individual Time: 0930-1000 PT Individual Time Calculation (min): 30 min   Short Term Goals: Week 1:  PT Short Term Goal 1 (Week 1): Pt will ambulate 150 ft with RW and Supervision PT Short Term Goal 2 (Week 1): Pt will perform sit to stand with RW with supervision PT Short Term Goal 3 (Week 1): Pt will perform bed to chair with RW with supervision  Skilled Therapeutic Interventions/Progress Updates: Pt presented in bed agreeable to therapy. Performed supine to sit supervision with use of bed rail (bed flat). Focused on sit to/from stand transfers to increase BLE wt bearing and decrease bracing against seated surface (x 5) with improved technique with repetitions and cues for increased eccentric control. Pt ambulated 152f x 2 no AD, cues for relaxed arm swing (pt holding ridgid). Performed balance activities on small wedge pt able to maintain with min/mod challenges. Pt performed functional activities in room ambulating to closet and searching for hairspray performing bending, reaching with min guard to supervision. Pt returned to recliner at end of session and left with call bell within reach and current needs met.      Therapy Documentation Precautions:  Precautions Precautions: Fall, Cervical Precaution Comments:  (Able to recall all precaution) Required Braces or Orthoses: Cervical Brace Cervical Brace: Hard collar Restrictions Weight Bearing Restrictions: No General:   Vital Signs:   Pain: Pain Assessment Faces Pain Scale: Hurts little more Pain Location: Shoulder Pain Orientation: Right Pain Descriptors / Indicators: Discomfort Pain Onset: On-going Pain Intervention(s): Shower;Repositioned;Ambulation/increased activity;Distraction;Heat applied Mobility: Transfers Sit to Stand: 6: Modified independent (Device/Increase time) Locomotion  :    Trunk/Postural Assessment : Cervical AROM Overall Cervical AROM Comments: Cervical collar in place Thoracic Assessment Thoracic Assessment: Exceptions to [facility] Infect[facility] DiseaseThoracic AROM Overall Thoracic AROM Comments: Cervical collar in place Lumbar Assessment Lumbar Assessment: Within Functional Limits Postural Control Postural Control: Within Functional Limits    See Function Navigator for Current Functional Status.   Therapy/Group: Individual Therapy  Kaimana Neuzil  Lisette Mancebo, PTA  05/05/2017, 12:48 PM

## 2017-05-05 NOTE — Progress Notes (Signed)
Occupational Therapy Discharge Summary  Patient Details  Name: Catherine Coleman MRN: [id number] Date of Birth: 08/10/1932  Today's Date: 05/05/2017 OT Individual Time: 0830-0930 OT Individual Time Calculation (min): 60 min   GRAD DAY! Self care retraining at shower level. Included education and demonstration for how to change c collar pads after  Showering in supine position. Pt showered with distant supervision for 8/10 parts and A for LB (feet) however pt ordered a long handled sponge to improve independence. Pt dressed without AE sitting EOB with only setup assistance. Pt returned to bed with small heating packs placed on her right scapula and trap region while laying in supine for some relief for shoulder discomfort.  Discussed no driving due to decr ROM of neck with brace and precautions with recommendation to follow up with MD in office.    Patient has met 4 of 11 long term goals due to improved activity tolerance, improved balance, postural control, ability to compensate for deficits and functional use of  RIGHT upper and LEFT upper extremity.  Patient to discharge at overall Supervision level. Pt wishes to continue her rehab at [facility]'s SNF as a way to transition back [place] property.   Reasons goals not met: Pt desire to transition to SNF in her transitional care living community instead of completing this program to get to Mod I goals for ADLs and iADLS- so overall supervision without AD.  Recommendation:  Patient will benefit from ongoing skilled OT services in skilled nursing facility setting to continue to advance functional skills in the area of iADL.  Equipment: No equipment provided  Reasons for discharge: discharge from hospital  Patient/family agrees with progress made and goals achieved: Yes  OT Discharge Precautions/Restrictions  Precautions Precautions: Fall;Cervical Required Braces or Orthoses: Cervical Brace Cervical Brace: Hard  collar Restrictions Weight Bearing Restrictions: No Pain Pain Assessment Pain Assessment: 0-10 Pain Score: 4  Faces Pain Scale: Hurts little more Pain Location: Shoulder Pain Orientation: Right Pain Descriptors / Indicators: Discomfort Pain Onset: On-going Pain Intervention(s): Shower;Repositioned;Ambulation/increased activity;Distraction;Heat applied ADL ADL ADL Comments: see functional navigator Vision Baseline Vision/History: Glaucoma Patient Visual Report: No change from baseline Vision Assessment?: No apparent visual deficits Perception  Perception: Within Functional Limits Praxis Praxis: Intact Cognition Overall Cognitive Status: Within Functional Limits for tasks assessed Arousal/Alertness: Awake/alert Orientation Level: Oriented X4 Attention: Selective Selective Attention: Appears intact Memory: Appears intact Awareness: Appears intact Problem Solving: Appears intact Safety/Judgment: Appears intact Sensation Sensation Light Touch: Appears Intact Stereognosis: Appears Intact Hot/Cold: Appears Intact Proprioception: Appears Intact Coordination Gross Motor Movements are Fluid and Coordinated: No Fine Motor Movements are Fluid and Coordinated: Yes Motor  Motor Motor - Skilled Clinical Observations: improved ovreall strength and activity tolerance - despite ongoing soreness and pain Mobility  Transfers Transfers: Sit to Stand Sit to Stand: 6: Modified independent (Device/Increase time)  Trunk/Postural Assessment  Cervical AROM Overall Cervical AROM Comments: Cervical collar in place Thoracic Assessment Thoracic Assessment: Exceptions to WFL Thoracic AROM Overall Thoracic AROM Comments: Cervical collar in place Lumbar Assessment Lumbar Assessment: Within Functional Limits Postural Control Postural Control: Within Functional Limits  Balance  overall supervision with dynamic balance activity and during ADLs without AD Extremity/Trunk Assessment RUE AROM  (degrees) RUE Overall AROM Comments: Due to precautions, pt cannot lift B UE > 90 degrees.  Observed functionally, no ROM loss in elbow/hands LUE AROM (degrees) LUE Overall AROM Comments: See R UE   See Function Navigator for Current Functional Status.  Smith, Jennifer Lynsey   05/05/2017, 9:51 AM  

## 2017-05-05 NOTE — Discharge Summary (Signed)
Discharge summary job [phone]

## 2017-05-05 NOTE — Progress Notes (Signed)
Physical Therapy Discharge Summary  Patient Details  Name: Catherine Coleman MRN: [id number] Date of Birth: July 21, 1932  Today's Date: 05/05/2017 PT Individual Time: 8299-3716 PT Individual Time Calculation (min): 45 min    Patient has met 3 of 7 long term goals due to improved activity tolerance, improved balance, decreased pain and ability to compensate for deficits.  Patient to discharge at an ambulatory level [place].   Patient's care partner unavailable to provide the necessary physical assistance at discharge, therefore pt to d/c to SNF for continued follow up therapy to progress towards long term goals.  Reasons goals not met: Unmet goals include gait at modI level, curb step at S level; goals unmet due to shorter length of stay than anticipated and pt to d/c to SNF for continued follow up therapy as opposed to home at modI level as originally intended.   Recommendation:  Patient will benefit from ongoing skilled PT services in skilled nursing facility setting to continue to advance safe functional mobility, address ongoing impairments in balance, strength, coordination, activity tolerance, and minimize fall risk.  Equipment: No equipment provided  Reasons for discharge: treatment goals met and discharge from hospital  Patient/family agrees with progress made and goals achieved: Yes  PT Discharge Precautions/Restrictions Precautions Precautions: Fall;Cervical Required Braces or Orthoses: Cervical Brace Cervical Brace: Hard collar Restrictions Weight Bearing Restrictions: No Pain Pain Assessment Pain Score: 4  Faces Pain Scale: Hurts little more Pain Location: Shoulder Pain Orientation: Right Pain Descriptors / Indicators: Discomfort Pain Onset: On-going Pain Intervention(s): Shower;Repositioned;Ambulation/increased activity;Distraction;Heat applied Vision/Perception  Perception Perception: Within Functional Limits Praxis Praxis: Intact  Cognition Overall Cognitive  Status: Within Functional Limits for tasks assessed Arousal/Alertness: Awake/alert Orientation Level: Oriented X4 Attention: Selective Selective Attention: Appears intact Memory: Appears intact Awareness: Appears intact Problem Solving: Appears intact Safety/Judgment: Appears intact Sensation Sensation Light Touch: Appears Intact Stereognosis: Appears Intact Hot/Cold: Appears Intact Proprioception: Appears Intact Coordination Gross Motor Movements are Fluid and Coordinated: Yes Fine Motor Movements are Fluid and Coordinated: Yes Heel Shin Test: decreased range, normal speed/coordination Motor  Motor Motor - Skilled Clinical Observations: improved ovreall strength and activity tolerance - despite ongoing soreness and pain  Mobility Bed Mobility Bed Mobility: Sit to Supine;Supine to Sit Supine to Sit: 6: Modified independent (Device/Increase time) Sit to Supine: 6: Modified independent (Device/Increase time) Transfers Transfers: Yes Sit to Stand: 6: Modified independent (Device/Increase time) Stand to Sit: 6: Modified independent (Device/Increase time) Stand Pivot Transfers: 6: Modified independent (Device/Increase time) Locomotion  Ambulation Ambulation: Yes Ambulation/Gait Assistance: 5: Supervision Ambulation Distance (Feet): 300 Feet Assistive device: Rolling walker Ambulation/Gait Assistance Details: Verbal cues for precautions/safety;Verbal cues for technique Gait Gait: Yes Gait Pattern: Poor foot clearance - left;Poor foot clearance - right;Lateral hip instability;Decreased trunk rotation Stairs / Additional Locomotion Stairs: Yes Stairs Assistance: 5: Supervision;4: Min guard Stairs Assistance Details (indicate cue type and reason): min guard for RW on curb step, S for stairs with B handrails Stair Management Technique: Two rails;Forwards;Alternating pattern Number of Stairs: 4 Height of Stairs: 6 Ramp: 5: Supervision Curb: 4: Min Engineer, drilling Mobility: No  Trunk/Postural Assessment  Cervical AROM Overall Cervical AROM Comments: Cervical collar in place Thoracic Assessment Thoracic Assessment: Exceptions to [facility] Thoracic AROM Overall Thoracic AROM Comments: Cervical collar in place Lumbar Assessment Lumbar Assessment: Within Functional Limits Postural Control Postural Control: Within Functional Limits  Balance Balance Balance Assessed: Yes Standardized Balance Assessment Standardized Balance Assessment: Berg Balance Test Berg Balance Test Sit to Stand: Able to stand  independently using hands Standing Unsupported: Able to stand safely 2 minutes Sitting with Back Unsupported but Feet Supported on Floor or Stool: Able to sit safely and securely 2 minutes Stand to Sit: Controls descent by using hands Transfers: Able to transfer with verbal cueing and /or supervision Standing Unsupported with Eyes Closed: Able to stand 10 seconds with supervision Standing Ubsupported with Feet Together: Able to place feet together independently and stand for 1 minute with supervision From Standing, Reach Forward with Outstretched Arm: Can reach forward >12 cm safely (5") From Standing Position, Pick up Object from Floor: Unable to try/needs assist to keep balance (back precautions) From Standing Position, Turn to Look Behind Over each Shoulder: Needs assist to keep from losing balance and falling (back precautions) Turn 360 Degrees: Able to turn 360 degrees safely but slowly Standing Unsupported, Alternately Place Feet on Step/Stool: Needs assistance to keep from falling or unable to try Standing Unsupported, One Foot in Front: Loses balance while stepping or standing Standing on One Leg: Unable to try or needs assist to prevent fall Total Score: 27 Dynamic Sitting Balance Dynamic Sitting - Balance Support: No upper extremity supported Dynamic Sitting - Level of Assistance: 6: Modified independent (Device/Increase  time) Dynamic Sitting - Balance Activities: Lateral lean/weight shifting;Forward lean/weight shifting;Reaching for objects Dynamic Standing Balance Dynamic Standing - Balance Support: No upper extremity supported Dynamic Standing - Level of Assistance: 5: Stand by assistance Dynamic Standing - Balance Activities: Lateral lean/weight shifting;Forward lean/weight shifting;Reaching for objects;Reaching across midline Extremity Assessment  RUE AROM (degrees) RUE Overall AROM Comments: Due to precautions, pt cannot lift B UE > 90 degrees.  Observed functionally, no ROM loss in elbow/hands LUE AROM (degrees) LUE Overall AROM Comments: See R UE RLE Assessment RLE Assessment: Exceptions to [facility] (grossly 4+/5 throughout with exception of hip flexion 4-/5) LLE Assessment LLE Assessment: Exceptions to [facility] (grossly 4+/5 throughout with exception of hip flexion 4-/5)  Skilled Therapeutic Intervention: Pt received supine in bed, c/o pain 6/10 in shoulders and agreeable to treatment. Assessed all mobility and balance outcomes as described above with modI for bed mobility and transfers, min guard gait with no AD, S gait with RW. Engaged pt in functional laundry task for focus on dynamic balance while carrying items, reaching to various heights to retrieve items, move laundry from washer to dryer, etc; close S overall. Returned to bed at end of session; remained supine with all needs in reach with daughter present. Daughter and pt with no further concerns regarding d/c to SNF at this time.   See Function Navigator for Current Functional Status.  Benjiman Core Tygielski 05/05/2017, 12:09 PM
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
## 2017-05-06 DIAGNOSIS — G252 Other specified forms of tremor: Secondary | ICD-10-CM | POA: Diagnosis not present

## 2017-05-06 DIAGNOSIS — Z7901 Long term (current) use of anticoagulants: Secondary | ICD-10-CM | POA: Diagnosis not present

## 2017-05-06 DIAGNOSIS — I10 Essential (primary) hypertension: Secondary | ICD-10-CM | POA: Diagnosis not present

## 2017-05-06 DIAGNOSIS — H4010X0 Unspecified open-angle glaucoma, stage unspecified: Secondary | ICD-10-CM | POA: Diagnosis not present

## 2017-05-06 DIAGNOSIS — M419 Scoliosis, unspecified: Secondary | ICD-10-CM | POA: Diagnosis not present

## 2017-05-06 DIAGNOSIS — H409 Unspecified glaucoma: Secondary | ICD-10-CM | POA: Diagnosis not present

## 2017-05-06 DIAGNOSIS — T148XXA Other injury of unspecified body region, initial encounter: Secondary | ICD-10-CM | POA: Diagnosis not present

## 2017-05-06 DIAGNOSIS — R52 Pain, unspecified: Secondary | ICD-10-CM | POA: Diagnosis not present

## 2017-05-06 DIAGNOSIS — E039 Hypothyroidism, unspecified: Secondary | ICD-10-CM | POA: Diagnosis not present

## 2017-05-06 DIAGNOSIS — E639 Nutritional deficiency, unspecified: Secondary | ICD-10-CM | POA: Diagnosis not present

## 2017-05-06 DIAGNOSIS — E559 Vitamin D deficiency, unspecified: Secondary | ICD-10-CM | POA: Diagnosis not present

## 2017-05-06 DIAGNOSIS — S12490G Other displaced fracture of fifth cervical vertebra, subsequent encounter for fracture with delayed healing: Secondary | ICD-10-CM | POA: Diagnosis not present

## 2017-05-06 DIAGNOSIS — R05 Cough: Secondary | ICD-10-CM | POA: Diagnosis not present

## 2017-05-06 DIAGNOSIS — Z7982 Long term (current) use of aspirin: Secondary | ICD-10-CM | POA: Diagnosis not present

## 2017-05-06 DIAGNOSIS — Z9181 History of falling: Secondary | ICD-10-CM | POA: Diagnosis not present

## 2017-05-06 DIAGNOSIS — K5730 Diverticulosis of large intestine without perforation or abscess without bleeding: Secondary | ICD-10-CM | POA: Diagnosis not present

## 2017-05-06 DIAGNOSIS — F419 Anxiety disorder, unspecified: Secondary | ICD-10-CM | POA: Diagnosis not present

## 2017-05-06 DIAGNOSIS — S129XXD Fracture of neck, unspecified, subsequent encounter: Secondary | ICD-10-CM | POA: Diagnosis not present

## 2017-05-06 DIAGNOSIS — J310 Chronic rhinitis: Secondary | ICD-10-CM | POA: Diagnosis not present

## 2017-05-06 DIAGNOSIS — J189 Pneumonia, unspecified organism: Secondary | ICD-10-CM | POA: Diagnosis not present

## 2017-05-06 DIAGNOSIS — D519 Vitamin B12 deficiency anemia, unspecified: Secondary | ICD-10-CM | POA: Diagnosis not present

## 2017-05-06 DIAGNOSIS — I700 Atherosclerosis of aorta: Secondary | ICD-10-CM | POA: Diagnosis not present

## 2017-05-06 DIAGNOSIS — E785 Hyperlipidemia, unspecified: Secondary | ICD-10-CM | POA: Diagnosis not present

## 2017-05-06 DIAGNOSIS — G43109 Migraine with aura, not intractable, without status migrainosus: Secondary | ICD-10-CM | POA: Diagnosis not present

## 2017-05-06 DIAGNOSIS — M25511 Pain in right shoulder: Secondary | ICD-10-CM | POA: Diagnosis not present

## 2017-05-06 DIAGNOSIS — M1611 Unilateral primary osteoarthritis, right hip: Secondary | ICD-10-CM | POA: Diagnosis not present

## 2017-05-06 DIAGNOSIS — I6501 Occlusion and stenosis of right vertebral artery: Secondary | ICD-10-CM | POA: Diagnosis not present

## 2017-05-06 DIAGNOSIS — M1990 Unspecified osteoarthritis, unspecified site: Secondary | ICD-10-CM | POA: Diagnosis not present

## 2017-05-06 DIAGNOSIS — I2510 Atherosclerotic heart disease of native coronary artery without angina pectoris: Secondary | ICD-10-CM | POA: Diagnosis not present

## 2017-05-06 DIAGNOSIS — R339 Retention of urine, unspecified: Secondary | ICD-10-CM | POA: Diagnosis not present

## 2017-05-06 DIAGNOSIS — M79602 Pain in left arm: Secondary | ICD-10-CM | POA: Diagnosis not present

## 2017-05-06 DIAGNOSIS — E669 Obesity, unspecified: Secondary | ICD-10-CM | POA: Diagnosis not present

## 2017-05-06 DIAGNOSIS — S2231XG Fracture of one rib, right side, subsequent encounter for fracture with delayed healing: Secondary | ICD-10-CM | POA: Diagnosis not present

## 2017-05-06 DIAGNOSIS — N390 Urinary tract infection, site not specified: Secondary | ICD-10-CM | POA: Diagnosis not present

## 2017-05-06 DIAGNOSIS — K5900 Constipation, unspecified: Secondary | ICD-10-CM | POA: Diagnosis not present

## 2017-05-06 DIAGNOSIS — I714 Abdominal aortic aneurysm, without rupture: Secondary | ICD-10-CM | POA: Diagnosis not present

## 2017-05-06 DIAGNOSIS — S2220XD Unspecified fracture of sternum, subsequent encounter for fracture with routine healing: Secondary | ICD-10-CM | POA: Diagnosis not present

## 2017-05-06 DIAGNOSIS — J439 Emphysema, unspecified: Secondary | ICD-10-CM | POA: Diagnosis not present

## 2017-05-06 DIAGNOSIS — G43909 Migraine, unspecified, not intractable, without status migrainosus: Secondary | ICD-10-CM | POA: Diagnosis not present

## 2017-05-06 DIAGNOSIS — Z87891 Personal history of nicotine dependence: Secondary | ICD-10-CM | POA: Diagnosis not present

## 2017-05-06 DIAGNOSIS — M6281 Muscle weakness (generalized): Secondary | ICD-10-CM | POA: Diagnosis not present

## 2017-05-06 DIAGNOSIS — K449 Diaphragmatic hernia without obstruction or gangrene: Secondary | ICD-10-CM | POA: Diagnosis not present

## 2017-05-06 DIAGNOSIS — S2220XG Unspecified fracture of sternum, subsequent encounter for fracture with delayed healing: Secondary | ICD-10-CM | POA: Diagnosis not present

## 2017-05-06 DIAGNOSIS — I4510 Unspecified right bundle-branch block: Secondary | ICD-10-CM | POA: Diagnosis not present

## 2017-05-06 DIAGNOSIS — B9689 Other specified bacterial agents as the cause of diseases classified elsewhere: Secondary | ICD-10-CM | POA: Diagnosis not present

## 2017-05-06 DIAGNOSIS — K219 Gastro-esophageal reflux disease without esophagitis: Secondary | ICD-10-CM | POA: Diagnosis not present

## 2017-05-06 DIAGNOSIS — S2232XD Fracture of one rib, left side, subsequent encounter for fracture with routine healing: Secondary | ICD-10-CM | POA: Diagnosis not present

## 2017-05-06 DIAGNOSIS — R0902 Hypoxemia: Secondary | ICD-10-CM | POA: Diagnosis not present

## 2017-05-06 DIAGNOSIS — Z6834 Body mass index (BMI) 34.0-34.9, adult: Secondary | ICD-10-CM | POA: Diagnosis not present

## 2017-05-06 DIAGNOSIS — R918 Other nonspecific abnormal finding of lung field: Secondary | ICD-10-CM | POA: Diagnosis not present

## 2017-05-06 DIAGNOSIS — I119 Hypertensive heart disease without heart failure: Secondary | ICD-10-CM | POA: Diagnosis not present

## 2017-05-06 DIAGNOSIS — R0602 Shortness of breath: Secondary | ICD-10-CM | POA: Diagnosis not present

## 2017-05-06 DIAGNOSIS — K1120 Sialoadenitis, unspecified: Secondary | ICD-10-CM | POA: Diagnosis not present

## 2017-05-06 DIAGNOSIS — S13101A Dislocation of unspecified cervical vertebrae, initial encounter: Secondary | ICD-10-CM | POA: Diagnosis not present

## 2017-05-06 MED FILL — fentaNYL 12 MCG/HR PT72: 12 | 15 days supply | Qty: 5 | Fill #0

## 2017-05-06 MED FILL — SULFAMETHOXAZOLE/TMP SS TAB: 400-80 | 6 days supply | Qty: 12 | Fill #0

## 2017-05-06 MED FILL — oxyCODONE HCL 5 MG TABS: 5 | 5 days supply | Qty: 30 | Fill #0

## 2017-05-06 MED FILL — LOSARTAN POTASSIUM 50 MG TA: 50 | 30 days supply | Qty: 30 | Fill #0

## 2017-05-06 MED FILL — METHOCARBAMOL 500 MG TABLET: 500 | 30 days supply | Qty: 120 | Fill #0

## 2017-05-06 MED FILL — GABAPENTIN 400 MG CAPSULE: 400 | 30 days supply | Qty: 90 | Fill #0

## 2017-05-06 MED FILL — TAMSULOSIN HCL 0.4 MG CAP: 0.4 | 30 days supply | Qty: 30 | Fill #0

## 2017-05-06 MED FILL — LORazepam 0.5 MG TABS: 0.5 | 15 days supply | Qty: 30 | Fill #0

## 2017-05-06 NOTE — Progress Notes (Signed)
Social Work  Discharge Note  The overall goal for the admission was met for:   Discharge location: No - initial goal was for [place], however, pt request d/c to [facility] LOC prior to [place].  Length of Stay: Yes - 6 days  Discharge activity level: Yes - supervision overall  Home/community participation: Yes  Services provided included: MD, RD, PT, OT, RN, Pharmacy and SW  Financial Services: Medicare and Private Insurance: [place]  Follow-up services arranged: Other: SNF at [facility]  Comments (or additional information):  Patient/Family verbalized understanding of follow-up arrangements: Yes  Individual responsible for coordination of the follow-up plan: pt  Confirmed correct DME delivered: NA    Catherine Coleman

## 2017-05-06 NOTE — Progress Notes (Signed)
Report called to SNF
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
## 2017-05-06 NOTE — Plan of Care (Signed)
Problem: RH PAIN MANAGEMENT Goal: RH STG PAIN MANAGED AT OR BELOW PT'S PAIN GOAL < 3: Managed with PRN medications and nonpharmacological interventions.  Outcome: Not Progressing Pt admin scheduled and PRN medications.
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
## 2017-05-11 DIAGNOSIS — G252 Other specified forms of tremor: Secondary | ICD-10-CM | POA: Diagnosis not present

## 2017-05-11 DIAGNOSIS — I10 Essential (primary) hypertension: Secondary | ICD-10-CM | POA: Diagnosis not present

## 2017-05-11 DIAGNOSIS — M6281 Muscle weakness (generalized): Secondary | ICD-10-CM | POA: Diagnosis not present

## 2017-05-11 DIAGNOSIS — I714 Abdominal aortic aneurysm, without rupture: Secondary | ICD-10-CM | POA: Diagnosis not present

## 2017-05-11 DIAGNOSIS — Z9181 History of falling: Secondary | ICD-10-CM | POA: Diagnosis not present

## 2017-05-14 ENCOUNTER — Emergency Department (HOSPITAL_COMMUNITY): Payer: No Typology Code available for payment source

## 2017-05-14 ENCOUNTER — Observation Stay (HOSPITAL_COMMUNITY)
Admission: EM | Admit: 2017-05-14 | Discharge: 2017-05-15 | Disposition: A | Discharge status: Home / Self Care | Payer: No Typology Code available for payment source | Attending: Family Medicine | Admitting: Family Medicine

## 2017-05-14 ENCOUNTER — Encounter (HOSPITAL_COMMUNITY): Payer: Self-pay | Admitting: Emergency Medicine

## 2017-05-14 DIAGNOSIS — S2220XG Unspecified fracture of sternum, subsequent encounter for fracture with delayed healing: Secondary | ICD-10-CM | POA: Insufficient documentation

## 2017-05-14 DIAGNOSIS — K1120 Sialoadenitis, unspecified: Secondary | ICD-10-CM | POA: Insufficient documentation

## 2017-05-14 DIAGNOSIS — M5134 Other intervertebral disc degeneration, thoracic region: Secondary | ICD-10-CM | POA: Insufficient documentation

## 2017-05-14 DIAGNOSIS — Z96611 Presence of right artificial shoulder joint: Secondary | ICD-10-CM | POA: Insufficient documentation

## 2017-05-14 DIAGNOSIS — K449 Diaphragmatic hernia without obstruction or gangrene: Secondary | ICD-10-CM | POA: Insufficient documentation

## 2017-05-14 DIAGNOSIS — E785 Hyperlipidemia, unspecified: Secondary | ICD-10-CM | POA: Insufficient documentation

## 2017-05-14 DIAGNOSIS — Z87891 Personal history of nicotine dependence: Secondary | ICD-10-CM | POA: Insufficient documentation

## 2017-05-14 DIAGNOSIS — I4510 Unspecified right bundle-branch block: Secondary | ICD-10-CM | POA: Insufficient documentation

## 2017-05-14 DIAGNOSIS — M1611 Unilateral primary osteoarthritis, right hip: Secondary | ICD-10-CM | POA: Insufficient documentation

## 2017-05-14 DIAGNOSIS — S12490G Other displaced fracture of fifth cervical vertebra, subsequent encounter for fracture with delayed healing: Secondary | ICD-10-CM | POA: Diagnosis not present

## 2017-05-14 DIAGNOSIS — I2510 Atherosclerotic heart disease of native coronary artery without angina pectoris: Secondary | ICD-10-CM | POA: Insufficient documentation

## 2017-05-14 DIAGNOSIS — F419 Anxiety disorder, unspecified: Secondary | ICD-10-CM | POA: Insufficient documentation

## 2017-05-14 DIAGNOSIS — H409 Unspecified glaucoma: Secondary | ICD-10-CM

## 2017-05-14 DIAGNOSIS — E039 Hypothyroidism, unspecified: Secondary | ICD-10-CM | POA: Diagnosis not present

## 2017-05-14 DIAGNOSIS — J189 Pneumonia, unspecified organism: Secondary | ICD-10-CM | POA: Diagnosis not present

## 2017-05-14 DIAGNOSIS — I119 Hypertensive heart disease without heart failure: Secondary | ICD-10-CM | POA: Diagnosis not present

## 2017-05-14 DIAGNOSIS — R0602 Shortness of breath: Secondary | ICD-10-CM | POA: Diagnosis not present

## 2017-05-14 DIAGNOSIS — Z96641 Presence of right artificial hip joint: Secondary | ICD-10-CM | POA: Insufficient documentation

## 2017-05-14 DIAGNOSIS — Z820 Family history of epilepsy and other diseases of the nervous system: Secondary | ICD-10-CM | POA: Insufficient documentation

## 2017-05-14 DIAGNOSIS — Z9049 Acquired absence of other specified parts of digestive tract: Secondary | ICD-10-CM | POA: Insufficient documentation

## 2017-05-14 DIAGNOSIS — Z79899 Other long term (current) drug therapy: Secondary | ICD-10-CM | POA: Insufficient documentation

## 2017-05-14 DIAGNOSIS — E559 Vitamin D deficiency, unspecified: Secondary | ICD-10-CM | POA: Insufficient documentation

## 2017-05-14 DIAGNOSIS — I700 Atherosclerosis of aorta: Secondary | ICD-10-CM | POA: Insufficient documentation

## 2017-05-14 DIAGNOSIS — S2231XG Fracture of one rib, right side, subsequent encounter for fracture with delayed healing: Secondary | ICD-10-CM | POA: Diagnosis not present

## 2017-05-14 DIAGNOSIS — M419 Scoliosis, unspecified: Secondary | ICD-10-CM | POA: Insufficient documentation

## 2017-05-14 DIAGNOSIS — Z96653 Presence of artificial knee joint, bilateral: Secondary | ICD-10-CM | POA: Insufficient documentation

## 2017-05-14 DIAGNOSIS — I6501 Occlusion and stenosis of right vertebral artery: Secondary | ICD-10-CM | POA: Insufficient documentation

## 2017-05-14 DIAGNOSIS — Z833 Family history of diabetes mellitus: Secondary | ICD-10-CM | POA: Insufficient documentation

## 2017-05-14 DIAGNOSIS — R0902 Hypoxemia: Secondary | ICD-10-CM | POA: Diagnosis not present

## 2017-05-14 DIAGNOSIS — R918 Other nonspecific abnormal finding of lung field: Secondary | ICD-10-CM | POA: Diagnosis not present

## 2017-05-14 DIAGNOSIS — Z823 Family history of stroke: Secondary | ICD-10-CM | POA: Insufficient documentation

## 2017-05-14 DIAGNOSIS — Z6834 Body mass index (BMI) 34.0-34.9, adult: Secondary | ICD-10-CM | POA: Insufficient documentation

## 2017-05-14 DIAGNOSIS — Z981 Arthrodesis status: Secondary | ICD-10-CM | POA: Insufficient documentation

## 2017-05-14 DIAGNOSIS — K219 Gastro-esophageal reflux disease without esophagitis: Secondary | ICD-10-CM | POA: Insufficient documentation

## 2017-05-14 DIAGNOSIS — J439 Emphysema, unspecified: Secondary | ICD-10-CM | POA: Insufficient documentation

## 2017-05-14 DIAGNOSIS — Z8249 Family history of ischemic heart disease and other diseases of the circulatory system: Secondary | ICD-10-CM | POA: Insufficient documentation

## 2017-05-14 DIAGNOSIS — Z96612 Presence of left artificial shoulder joint: Secondary | ICD-10-CM | POA: Insufficient documentation

## 2017-05-14 DIAGNOSIS — Z7982 Long term (current) use of aspirin: Secondary | ICD-10-CM | POA: Insufficient documentation

## 2017-05-14 DIAGNOSIS — Z8261 Family history of arthritis: Secondary | ICD-10-CM | POA: Insufficient documentation

## 2017-05-14 DIAGNOSIS — Z800 Family history of malignant neoplasm of digestive organs: Secondary | ICD-10-CM | POA: Insufficient documentation

## 2017-05-14 DIAGNOSIS — E669 Obesity, unspecified: Secondary | ICD-10-CM | POA: Insufficient documentation

## 2017-05-14 DIAGNOSIS — K5730 Diverticulosis of large intestine without perforation or abscess without bleeding: Secondary | ICD-10-CM | POA: Insufficient documentation

## 2017-05-14 DIAGNOSIS — J310 Chronic rhinitis: Secondary | ICD-10-CM | POA: Insufficient documentation

## 2017-05-14 DIAGNOSIS — G43909 Migraine, unspecified, not intractable, without status migrainosus: Secondary | ICD-10-CM | POA: Insufficient documentation

## 2017-05-14 LAB — CBC
HCT: 33.8 % — ABNORMAL LOW (ref 36.0–46.0)
HEMOGLOBIN: 11 g/dL — AB (ref 12.0–15.0)
MCH: 30.7 pg (ref 26.0–34.0)
MCHC: 32.5 g/dL (ref 30.0–36.0)
MCV: 94.4 fL (ref 78.0–100.0)
Platelets: 374 10*3/uL (ref 150–400)
RBC: 3.58 MIL/uL — AB (ref 3.87–5.11)
RDW: 12.7 % (ref 11.5–15.5)
WBC: 6.9 10*3/uL (ref 4.0–10.5)

## 2017-05-14 LAB — I-STAT TROPONIN, ED
TROPONIN I, POC: 0.01 ng/mL (ref 0.00–0.08)
TROPONIN I, POC: 0 ng/mL (ref 0.00–0.08)

## 2017-05-14 LAB — URINALYSIS, ROUTINE W REFLEX MICROSCOPIC
Bilirubin Urine: NEGATIVE
Glucose, UA: NEGATIVE mg/dL
Hgb urine dipstick: NEGATIVE
KETONES UR: NEGATIVE mg/dL
LEUKOCYTES UA: NEGATIVE
NITRITE: NEGATIVE
Protein, ur: NEGATIVE mg/dL
Specific Gravity, Urine: 1.013 (ref 1.005–1.030)
pH: 6 (ref 5.0–8.0)

## 2017-05-14 LAB — BASIC METABOLIC PANEL
ANION GAP: 7 (ref 5–15)
BUN: 19 mg/dL (ref 6–20)
CALCIUM: 8.8 mg/dL — AB (ref 8.9–10.3)
CHLORIDE: 99 mmol/L — AB (ref 101–111)
CO2: 27 mmol/L (ref 22–32)
Creatinine, Ser: 0.69 mg/dL (ref 0.44–1.00)
GFR calc non Af Amer: >60 mL/min (ref >60)
Glucose, Bld: 119 mg/dL — ABNORMAL HIGH (ref 65–99)
POTASSIUM: 4.2 mmol/L (ref 3.5–5.1)
Sodium: 133 mmol/L — ABNORMAL LOW (ref 135–145)

## 2017-05-14 LAB — I-STAT CG4 LACTIC ACID, ED: Lactic Acid, Venous: 0.88 mmol/L (ref 0.5–1.9)

## 2017-05-14 MED ORDER — ALBUTEROL SULFATE (2.5 MG/3ML) 0.083% IN NEBU: 2.5000 mg | INHALATION_SOLUTION | RESPIRATORY_TRACT | Status: DC | PRN

## 2017-05-14 MED ORDER — PANTOPRAZOLE SODIUM 40 MG PO TBEC
40.0000 mg | DELAYED_RELEASE_TABLET | Freq: Every day | ORAL | Status: DC
  Administered 2017-05-14: 40 mg via ORAL
  Filled 2017-05-14 (×2): qty 1

## 2017-05-14 MED ORDER — OXYCODONE HCL 5 MG PO TABS
5.0000 mg | ORAL_TABLET | ORAL | Status: DC | PRN
  Administered 2017-05-14 – 2017-05-15 (×2): 10 mg via ORAL
  Filled 2017-05-14 (×2): qty 2

## 2017-05-14 MED ORDER — LIDOCAINE 5 % EX PTCH
1.0000 | MEDICATED_PATCH | CUTANEOUS | Status: DC
  Filled 2017-05-14: qty 1

## 2017-05-14 MED ORDER — DEXTROSE 5 % IV SOLN
2.0000 g | Freq: Once | INTRAVENOUS | Status: AC
  Administered 2017-05-14: 2 g via INTRAVENOUS
  Filled 2017-05-14: qty 2

## 2017-05-14 MED ORDER — ACETAMINOPHEN 325 MG PO TABS: 650.0000 mg | ORAL_TABLET | ORAL | Status: DC | PRN

## 2017-05-14 MED ORDER — LEVOTHYROXINE SODIUM 100 MCG PO TABS
100.0000 ug | ORAL_TABLET | Freq: Every day | ORAL | Status: DC
  Administered 2017-05-15: 100 ug via ORAL
  Filled 2017-05-14: qty 1

## 2017-05-14 MED ORDER — VITAMIN B-12 100 MCG PO TABS
2500.0000 ug | ORAL_TABLET | Freq: Every day | ORAL | Status: DC
  Administered 2017-05-14: 2500 ug via ORAL
  Filled 2017-05-14: qty 25

## 2017-05-14 MED ORDER — LOSARTAN POTASSIUM 50 MG PO TABS
50.0000 mg | ORAL_TABLET | Freq: Every day | ORAL | Status: DC
  Administered 2017-05-14 – 2017-05-15 (×2): 50 mg via ORAL
  Filled 2017-05-14 (×2): qty 1

## 2017-05-14 MED ORDER — METHOCARBAMOL 500 MG PO TABS
500.0000 mg | ORAL_TABLET | Freq: Once | ORAL | Status: AC
  Administered 2017-05-14: 500 mg via ORAL
  Filled 2017-05-14: qty 1

## 2017-05-14 MED ORDER — FENTANYL CITRATE (PF) 100 MCG/2ML IJ SOLN
50.0000 ug | INTRAMUSCULAR | Status: DC | PRN
  Administered 2017-05-14 – 2017-05-15 (×3): 50 ug via INTRAVENOUS
  Filled 2017-05-14 (×3): qty 2

## 2017-05-14 MED ORDER — ONDANSETRON HCL 4 MG/2ML IJ SOLN
4.0000 mg | Freq: Once | INTRAMUSCULAR | Status: AC
  Administered 2017-05-14: 4 mg via INTRAVENOUS
  Filled 2017-05-14: qty 2

## 2017-05-14 MED ORDER — IOPAMIDOL (ISOVUE-370) INJECTION 76%
INTRAVENOUS | Status: AC
  Administered 2017-05-14: 80 mL
  Filled 2017-05-14: qty 100

## 2017-05-14 MED ORDER — ASPIRIN EC 325 MG PO TBEC
325.0000 mg | DELAYED_RELEASE_TABLET | Freq: Every day | ORAL | Status: DC
  Administered 2017-05-14 – 2017-05-15 (×2): 325 mg via ORAL
  Filled 2017-05-14 (×2): qty 1

## 2017-05-14 MED ORDER — MORPHINE SULFATE (PF) 4 MG/ML IV SOLN: 2.0000 mg | Freq: Once | INTRAVENOUS | Status: DC

## 2017-05-14 MED ORDER — GUAIFENESIN ER 600 MG PO TB12
600.0000 mg | ORAL_TABLET | Freq: Two times a day (BID) | ORAL | Status: DC
  Administered 2017-05-14 – 2017-05-15 (×2): 600 mg via ORAL
  Filled 2017-05-14 (×2): qty 1

## 2017-05-14 MED ORDER — LORAZEPAM 0.5 MG PO TABS: 0.5000 mg | ORAL_TABLET | Freq: Two times a day (BID) | ORAL | Status: DC | PRN

## 2017-05-14 MED ORDER — MORPHINE SULFATE (PF) 4 MG/ML IV SOLN
2.0000 mg | INTRAVENOUS | Status: DC | PRN
  Administered 2017-05-14: 2 mg via INTRAVENOUS
  Filled 2017-05-14: qty 1

## 2017-05-14 MED ORDER — VANCOMYCIN HCL IN DEXTROSE 750-5 MG/150ML-% IV SOLN
750.0000 mg | Freq: Two times a day (BID) | INTRAVENOUS | Status: DC
  Administered 2017-05-15: 750 mg via INTRAVENOUS
  Filled 2017-05-14 (×4): qty 150

## 2017-05-14 MED ORDER — ONDANSETRON HCL 4 MG/2ML IJ SOLN: 4.0000 mg | Freq: Four times a day (QID) | INTRAMUSCULAR | Status: DC | PRN

## 2017-05-14 MED ORDER — POLYETHYLENE GLYCOL 3350 17 G PO PACK
17.0000 g | PACK | Freq: Every day | ORAL | Status: DC
  Administered 2017-05-14: 17 g via ORAL
  Filled 2017-05-14: qty 1

## 2017-05-14 MED ORDER — VANCOMYCIN HCL 10 G IV SOLR
1750.0000 mg | Freq: Once | INTRAVENOUS | Status: AC
  Administered 2017-05-14: 1750 mg via INTRAVENOUS
  Filled 2017-05-14: qty 1750

## 2017-05-14 MED ORDER — MORPHINE SULFATE (PF) 4 MG/ML IV SOLN
2.0000 mg | Freq: Once | INTRAVENOUS | Status: AC
  Administered 2017-05-14: 2 mg via INTRAVENOUS
  Filled 2017-05-14: qty 1

## 2017-05-14 MED ORDER — SODIUM CHLORIDE 0.9 % IV BOLUS (SEPSIS)
1000.0000 mL | Freq: Once | INTRAVENOUS | Status: AC
  Administered 2017-05-14: 1000 mL via INTRAVENOUS

## 2017-05-14 MED ORDER — DOCUSATE SODIUM 100 MG PO CAPS
100.0000 mg | ORAL_CAPSULE | Freq: Two times a day (BID) | ORAL | Status: DC
  Administered 2017-05-14: 100 mg via ORAL
  Filled 2017-05-14: qty 1

## 2017-05-14 MED ORDER — GABAPENTIN 400 MG PO CAPS
400.0000 mg | ORAL_CAPSULE | Freq: Three times a day (TID) | ORAL | Status: DC
  Administered 2017-05-14 – 2017-05-15 (×2): 400 mg via ORAL
  Filled 2017-05-14 (×2): qty 1

## 2017-05-14 MED ORDER — ALBUTEROL SULFATE (2.5 MG/3ML) 0.083% IN NEBU
2.5000 mg | INHALATION_SOLUTION | Freq: Three times a day (TID) | RESPIRATORY_TRACT | Status: DC
  Administered 2017-05-14 – 2017-05-15 (×2): 2.5 mg via RESPIRATORY_TRACT
  Filled 2017-05-14 (×2): qty 3

## 2017-05-14 MED ORDER — ASPIRIN EC 325 MG PO TBEC: 325.0000 mg | DELAYED_RELEASE_TABLET | Freq: Every day | ORAL | Status: DC

## 2017-05-14 MED ORDER — BRIMONIDINE TARTRATE 0.2 % OP SOLN
1.0000 [drp] | Freq: Two times a day (BID) | OPHTHALMIC | Status: DC
  Administered 2017-05-14 – 2017-05-15 (×2): 1 [drp] via OPHTHALMIC
  Filled 2017-05-14: qty 5

## 2017-05-14 MED ORDER — METHOCARBAMOL 500 MG PO TABS
500.0000 mg | ORAL_TABLET | Freq: Four times a day (QID) | ORAL | Status: DC
  Administered 2017-05-14 – 2017-05-15 (×3): 500 mg via ORAL
  Filled 2017-05-14 (×3): qty 1
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
## 2017-05-14 NOTE — ED Notes (Signed)
Patient transported to X-ray 
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
## 2017-05-14 NOTE — ED Notes (Signed)
Ambulated patient in hallway checking pulse oximetry. Patient oxygen saturation dropped to 88% on room however returned to mid 90's shortly after. Patient has no complaints of shortness of breath, lightheadedness or dizziness.
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
## 2017-05-14 NOTE — ED Notes (Signed)
Pt. phentynal patch removed from left chest and shoulder.

## 2017-05-14 NOTE — Progress Notes (Signed)
Patient requesting her home fentanyl patch 12.24mcg q 72hrs. Paged Dr. Aggie Moats, awaiting call back/orders.
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
## 2017-05-14 NOTE — ED Notes (Signed)
Pt placed on 2L O2 

## 2017-05-15 DIAGNOSIS — S2232XD Fracture of one rib, left side, subsequent encounter for fracture with routine healing: Secondary | ICD-10-CM | POA: Diagnosis not present

## 2017-05-15 DIAGNOSIS — S12490G Other displaced fracture of fifth cervical vertebra, subsequent encounter for fracture with delayed healing: Secondary | ICD-10-CM | POA: Diagnosis not present

## 2017-05-15 DIAGNOSIS — K1120 Sialoadenitis, unspecified: Secondary | ICD-10-CM | POA: Diagnosis not present

## 2017-05-15 DIAGNOSIS — S2220XD Unspecified fracture of sternum, subsequent encounter for fracture with routine healing: Secondary | ICD-10-CM | POA: Diagnosis not present

## 2017-05-15 DIAGNOSIS — B9689 Other specified bacterial agents as the cause of diseases classified elsewhere: Secondary | ICD-10-CM | POA: Diagnosis not present

## 2017-05-15 DIAGNOSIS — S2231XG Fracture of one rib, right side, subsequent encounter for fracture with delayed healing: Secondary | ICD-10-CM | POA: Diagnosis not present

## 2017-05-15 DIAGNOSIS — R309 Painful micturition, unspecified: Secondary | ICD-10-CM | POA: Diagnosis not present

## 2017-05-15 DIAGNOSIS — J189 Pneumonia, unspecified organism: Secondary | ICD-10-CM | POA: Diagnosis not present

## 2017-05-15 DIAGNOSIS — H409 Unspecified glaucoma: Secondary | ICD-10-CM | POA: Diagnosis not present

## 2017-05-15 DIAGNOSIS — N390 Urinary tract infection, site not specified: Secondary | ICD-10-CM | POA: Diagnosis not present

## 2017-05-15 DIAGNOSIS — R0902 Hypoxemia: Secondary | ICD-10-CM | POA: Diagnosis not present

## 2017-05-15 DIAGNOSIS — S129XXD Fracture of neck, unspecified, subsequent encounter: Secondary | ICD-10-CM | POA: Diagnosis not present

## 2017-05-15 DIAGNOSIS — M6281 Muscle weakness (generalized): Secondary | ICD-10-CM | POA: Diagnosis not present

## 2017-05-15 DIAGNOSIS — R2681 Unsteadiness on feet: Secondary | ICD-10-CM | POA: Diagnosis not present

## 2017-05-15 DIAGNOSIS — I119 Hypertensive heart disease without heart failure: Secondary | ICD-10-CM | POA: Diagnosis not present

## 2017-05-15 DIAGNOSIS — I10 Essential (primary) hypertension: Secondary | ICD-10-CM | POA: Diagnosis not present

## 2017-05-15 DIAGNOSIS — R293 Abnormal posture: Secondary | ICD-10-CM | POA: Diagnosis not present

## 2017-05-15 DIAGNOSIS — R278 Other lack of coordination: Secondary | ICD-10-CM | POA: Diagnosis not present

## 2017-05-15 LAB — CBC WITH DIFFERENTIAL/PLATELET
BASOS ABS: 0 10*3/uL (ref 0.0–0.1)
Basophils Relative: 1 %
EOS ABS: 0.3 10*3/uL (ref 0.0–0.7)
Eosinophils Relative: 5 %
HCT: 36.4 % (ref 36.0–46.0)
HEMOGLOBIN: 11.8 g/dL — AB (ref 12.0–15.0)
LYMPHS ABS: 1.4 10*3/uL (ref 0.7–4.0)
Lymphocytes Relative: 22 %
MCH: 30.6 pg (ref 26.0–34.0)
MCHC: 32.4 g/dL (ref 30.0–36.0)
MCV: 94.5 fL (ref 78.0–100.0)
Monocytes Absolute: 0.6 10*3/uL (ref 0.1–1.0)
Monocytes Relative: 9 %
NEUTROS PCT: 63 %
Neutro Abs: 3.9 10*3/uL (ref 1.7–7.7)
Platelets: 385 10*3/uL (ref 150–400)
RBC: 3.85 MIL/uL — AB (ref 3.87–5.11)
RDW: 12.8 % (ref 11.5–15.5)
WBC: 6.3 10*3/uL (ref 4.0–10.5)

## 2017-05-15 LAB — BASIC METABOLIC PANEL
Anion gap: 8 (ref 5–15)
BUN: 6 mg/dL (ref 6–20)
CALCIUM: 9.2 mg/dL (ref 8.9–10.3)
CHLORIDE: 99 mmol/L — AB (ref 101–111)
CO2: 30 mmol/L (ref 22–32)
CREATININE: 0.56 mg/dL (ref 0.44–1.00)
GFR calc non Af Amer: >60 mL/min (ref >60)
Glucose, Bld: 108 mg/dL — ABNORMAL HIGH (ref 65–99)
Potassium: 4.5 mmol/L (ref 3.5–5.1)
SODIUM: 137 mmol/L (ref 135–145)

## 2017-05-15 MED ORDER — OXYCODONE HCL 5 MG PO TABS: 5.0000 mg | ORAL_TABLET | ORAL | 0 refills | Status: DC | PRN

## 2017-05-15 MED ORDER — FENTANYL 12 MCG/HR TD PT72: 12.5000 ug | MEDICATED_PATCH | TRANSDERMAL | 0 refills | Status: DC

## 2017-05-15 MED ORDER — ALBUTEROL SULFATE (2.5 MG/3ML) 0.083% IN NEBU: 2.5000 mg | INHALATION_SOLUTION | Freq: Every day | RESPIRATORY_TRACT | Status: DC

## 2017-05-15 MED ORDER — LORAZEPAM 0.5 MG PO TABS: 0.5000 mg | ORAL_TABLET | Freq: Two times a day (BID) | ORAL | 0 refills | Status: DC | PRN

## 2017-05-15 NOTE — Clinical Social Work Note (Addendum)
Clinical Social Work Assessment  Patient Details  Name: Catherine Coleman MRN: [id number] Date of Birth: 12-17-1931  Date of referral:  05/15/17               Reason for consult:  Facility Placement                Permission sought to share information with:  Family Supports, Chartered certified accountant granted to share information::  Yes, Verbal Permission Granted  Name::     Company secretary::  [facility]  Relationship::  Daughter  Sport and exercise psychologist Information:     Housing/Transportation Living arrangements for the past 2 months:  [place], Chartered loss adjuster of Information:  Patient Patient Interpreter Needed:  None Criminal Activity/Legal Involvement Pertinent to Current Situation/Hospitalization:  No - Comment as needed Significant Relationships:  Adult Children Lives with:  Self, Facility Resident Do you feel safe going back to the place where you live?  Yes Need for family participation in patient care:  No (Coment)  Care giving concerns:  Patient has been receiving rehab at [facility] and has no concerns about care received.   Social Worker assessment / plan:  CSW met with patient and patient's daughter at bedside to discuss discharge planning. Patient and daughter confirmed that patient had been receiving rehab over at [facility] and would return. CSW contacted facility and confirmed that they could accept patient back. CSW to follow to facilitate discharge.   Employment status:  Retired Forensic scientist:  Medicare PT Recommendations:  Not assessed at this time Information / Referral to community resources:     Patient/Family's Response to care:  Patient agreeable to return to SNF.  Patient/Family's Understanding of and Emotional Response to Diagnosis, Current Treatment, and Prognosis:  Patient is excited to leave the hospital and return back home. Patient is eager to return to rehab and recover independence.  Emotional  Assessment Appearance:  Appears stated age Attitude/Demeanor/Rapport:    Affect (typically observed):  Appropriate Orientation:  Oriented to Self, Oriented to Place, Oriented to  Time, Oriented to Situation Alcohol / Substance use:  Not Applicable Psych involvement (Current and /or in the community):  No (Comment)  Discharge Needs  Concerns to be addressed:  Care Coordination Readmission within the last 30 days:  Yes Current discharge risk:  Physical Impairment Barriers to Discharge:  No Barriers Identified   Geralynn Ochs, LCSW 05/15/2017, 4:06 PM

## 2017-05-15 NOTE — NC FL2 (Signed)
[place] LEVEL OF CARE SCREENING TOOL     IDENTIFICATION  Patient Name: Catherine Coleman Birthdate: 1931/09/28 Sex: female Admission Date (Current Location): 05/14/2017  [facility] and [place] Number:  Herbalist and Address:  [facility]. [facility], [place] [address], [place], [place] [address]      Provider Number: [id number]  Attending Physician Name and Address:  Nita Sells, MD  Relative Name and Phone Number:       Current Level of Care: Hospital Recommended Level of Care: [facility] Prior Approval Number:    Date Approved/Denied:   PASRR Number: [id number]  Discharge Plan: SNF    Current Diagnoses: Patient Active Problem List   Diagnosis Date Noted  . Hypoxia 05/14/2017  . Closed dislocation cervical spine, initial encounter 04/30/2017  . Sternal fracture 04/30/2017  . Cervical spine fracture ([place]) 04/26/2017  . Parotiditis 04/21/2016  . Breast cancer screening 04/21/2016  . Atrophic rhinitis 04/21/2016  . Obesity 04/02/2016  . Diverticulosis of colon without hemorrhage 04/02/2016  . Vitamin D deficiency 04/02/2016  . Hyperlipidemia, mild 04/02/2016  . Hypertension   . Arthritis   . Hiatal hernia with gastroesophageal reflux   . Glaucoma   . Hypothyroidism   . Primary osteoarthritis of right hip 03/22/2015    Orientation RESPIRATION BLADDER Height & Weight     Self, Time, Situation, Place  Normal Continent Weight: 205 lb 4 oz (93.1 kg) Height:  5\' 5"  (165.1 cm)  BEHAVIORAL SYMPTOMS/MOOD NEUROLOGICAL BOWEL NUTRITION STATUS      Continent Diet (cardiac)  AMBULATORY STATUS COMMUNICATION OF NEEDS Skin   Limited Assist Verbally Surgical wounds                       Personal Care Assistance Level of Assistance  Bathing, Dressing Bathing Assistance: Limited assistance   Dressing Assistance: Limited assistance     Functional Limitations Info  Hearing   Hearing Info: Impaired (hearing  aids)      SPECIAL CARE FACTORS FREQUENCY  PT (By licensed PT), OT (By licensed OT)     PT Frequency: 5x/wk OT Frequency: 5x/wk            Contractures      Additional Factors Info  Code Status, Allergies Code Status Info: Full Allergies Info: Other           Current Medications (05/15/2017):  This is the current hospital active medication list Current Facility-Administered Medications  Medication Dose Route Frequency Provider Last Rate Last Dose  . acetaminophen (TYLENOL) tablet 650 mg  650 mg Oral Q4H PRN Elwin Mocha, MD      . albuterol (PROVENTIL) (2.5 MG/3ML) 0.083% nebulizer solution 2.5 mg  2.5 mg Nebulization Q2H PRN Elwin Mocha, MD      . Derrill Memo ON 05/16/2017] albuterol (PROVENTIL) (2.5 MG/3ML) 0.083% nebulizer solution 2.5 mg  2.5 mg Nebulization Daily Nita Sells, MD      . aspirin EC tablet 325 mg  325 mg Oral Daily Elwin Mocha, MD   325 mg at 05/15/17 1201  . brimonidine (ALPHAGAN) 0.2 % ophthalmic solution 1 drop  1 drop Both Eyes BID Elwin Mocha, MD   1 drop at 05/15/17 1202  . docusate sodium (COLACE) capsule 100 mg  100 mg Oral BID Elwin Mocha, MD   100 mg at 05/14/17 2308  . fentaNYL (SUBLIMAZE) injection 50 mcg  50 mcg Intravenous Q4H PRN Elwin Mocha, MD  50 mcg at 05/15/17 1311  . gabapentin (NEURONTIN) capsule 400 mg  400 mg Oral TID Elwin Mocha, MD   400 mg at 05/15/17 1201  . guaiFENesin (MUCINEX) 12 hr tablet 600 mg  600 mg Oral BID Elwin Mocha, MD   600 mg at 05/15/17 1201  . levothyroxine (SYNTHROID, LEVOTHROID) tablet 100 mcg  100 mcg Oral QAC breakfast Elwin Mocha, MD   100 mcg at 05/15/17 [phone]  . lidocaine (LIDODERM) 5 % 1 patch  1 patch Transdermal Q24H Elwin Mocha, MD      . LORazepam (ATIVAN) tablet 0.5 mg  0.5 mg Oral BID PRN Elwin Mocha, MD      . losartan (COZAAR) tablet 50 mg  50 mg Oral Daily Elwin Mocha, MD   50 mg at 05/15/17 1212  . methocarbamol (ROBAXIN) tablet 500 mg   500 mg Oral QID Elwin Mocha, MD   500 mg at 05/15/17 1202  . morphine 4 MG/ML injection 2 mg  2 mg Intravenous Q2H PRN Elwin Mocha, MD   2 mg at 05/14/17 1403  . ondansetron (ZOFRAN) injection 4 mg  4 mg Intravenous Q6H PRN Elwin Mocha, MD      . oxyCODONE (Oxy IR/ROXICODONE) immediate release tablet 5-10 mg  5-10 mg Oral Q4H PRN Elwin Mocha, MD   10 mg at 05/15/17 0647  . pantoprazole (PROTONIX) EC tablet 40 mg  40 mg Oral Daily Elwin Mocha, MD   40 mg at 05/14/17 1946  . polyethylene glycol (MIRALAX / GLYCOLAX) packet 17 g  17 g Oral Daily Elwin Mocha, MD   17 g at 05/14/17 1945  . vancomycin (VANCOCIN) IVPB 750 mg/150 ml premix  750 mg Intravenous Q12H Rumbarger, Valeda Malm, RPH   Stopped at 05/15/17 0115  . vitamin B-12 (CYANOCOBALAMIN) tablet 2,500 mcg  2,500 mcg Oral Daily Elwin Mocha, MD   2,500 mcg at 05/14/17 2306     Discharge Medications: Please see discharge summary for a list of discharge medications.  Relevant Imaging Results:  Relevant Lab Results:   Additional Information SS#: [id number]  Geralynn Ochs, LCSW

## 2017-05-15 NOTE — Progress Notes (Signed)
SATURATION QUALIFICATIONS: (This note is used to comply with regulatory documentation for home oxygen)  Patient Saturations on Room Air at Rest = 98%  Patient Saturations on Room Air while Ambulating =  93%  Patient Saturations on N/A Liters of oxygen while Ambulating = N/A%  Please briefly explain why patient needs home oxygen: 

## 2017-05-15 NOTE — Discharge Summary (Signed)
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 hours.     SignedNita Sells MD   [facility] 05/15/2017, 11:40 AM

## 2017-05-15 NOTE — Progress Notes (Signed)
Discharge to: [facility] Anticipated discharge date: 05/15/17 Family notified: Yes, at bedside Transportation by: Family car  Report #: [phone], ask for nursing in [place] signing off.  Laveda Abbe LCSW [phone]

## 2017-05-18 DIAGNOSIS — J189 Pneumonia, unspecified organism: Secondary | ICD-10-CM | POA: Diagnosis not present

## 2017-05-18 DIAGNOSIS — I10 Essential (primary) hypertension: Secondary | ICD-10-CM | POA: Diagnosis not present

## 2017-05-18 DIAGNOSIS — M6281 Muscle weakness (generalized): Secondary | ICD-10-CM | POA: Diagnosis not present

## 2017-05-21 DIAGNOSIS — R309 Painful micturition, unspecified: Secondary | ICD-10-CM | POA: Diagnosis not present

## 2017-05-27 DIAGNOSIS — R3915 Urgency of urination: Secondary | ICD-10-CM | POA: Diagnosis not present

## 2017-06-03 DIAGNOSIS — S129XXD Fracture of neck, unspecified, subsequent encounter: Secondary | ICD-10-CM | POA: Diagnosis not present

## 2017-06-03 DIAGNOSIS — J189 Pneumonia, unspecified organism: Secondary | ICD-10-CM | POA: Diagnosis not present

## 2017-06-03 DIAGNOSIS — E039 Hypothyroidism, unspecified: Secondary | ICD-10-CM | POA: Diagnosis not present

## 2017-06-03 DIAGNOSIS — I10 Essential (primary) hypertension: Secondary | ICD-10-CM | POA: Diagnosis not present

## 2017-06-09 ENCOUNTER — Encounter: Payer: Self-pay | Admitting: Physical Medicine & Rehabilitation

## 2017-06-09 ENCOUNTER — Encounter: Payer: Medicare Other | Attending: Physical Medicine & Rehabilitation | Admitting: Physical Medicine & Rehabilitation

## 2017-06-09 VITALS — BP 125/90 | HR 80

## 2017-06-09 DIAGNOSIS — E039 Hypothyroidism, unspecified: Secondary | ICD-10-CM | POA: Insufficient documentation

## 2017-06-09 DIAGNOSIS — E559 Vitamin D deficiency, unspecified: Secondary | ICD-10-CM | POA: Insufficient documentation

## 2017-06-09 DIAGNOSIS — Z8781 Personal history of (healed) traumatic fracture: Secondary | ICD-10-CM

## 2017-06-09 DIAGNOSIS — Z87891 Personal history of nicotine dependence: Secondary | ICD-10-CM | POA: Diagnosis not present

## 2017-06-09 DIAGNOSIS — S2220XA Unspecified fracture of sternum, initial encounter for closed fracture: Secondary | ICD-10-CM | POA: Insufficient documentation

## 2017-06-09 DIAGNOSIS — E669 Obesity, unspecified: Secondary | ICD-10-CM | POA: Insufficient documentation

## 2017-06-09 DIAGNOSIS — E785 Hyperlipidemia, unspecified: Secondary | ICD-10-CM | POA: Insufficient documentation

## 2017-06-09 DIAGNOSIS — K219 Gastro-esophageal reflux disease without esophagitis: Secondary | ICD-10-CM | POA: Diagnosis not present

## 2017-06-09 DIAGNOSIS — I10 Essential (primary) hypertension: Secondary | ICD-10-CM | POA: Insufficient documentation

## 2017-06-09 DIAGNOSIS — K449 Diaphragmatic hernia without obstruction or gangrene: Secondary | ICD-10-CM | POA: Insufficient documentation

## 2017-06-09 DIAGNOSIS — S2222XS Fracture of body of sternum, sequela: Secondary | ICD-10-CM | POA: Diagnosis not present

## 2017-06-09 DIAGNOSIS — X58XXXA Exposure to other specified factors, initial encounter: Secondary | ICD-10-CM | POA: Insufficient documentation

## 2017-06-09 DIAGNOSIS — H409 Unspecified glaucoma: Secondary | ICD-10-CM | POA: Insufficient documentation

## 2017-06-09 NOTE — Patient Instructions (Addendum)
PLEASE FEEL FREE TO CALL OUR OFFICE WITH ANY PROBLEMS OR QUESTIONS ([phone])      
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
## 2017-06-14 ENCOUNTER — Ambulatory Visit (INDEPENDENT_AMBULATORY_CARE_PROVIDER_SITE_OTHER): Payer: Medicare Other | Admitting: Family Medicine

## 2017-06-14 ENCOUNTER — Encounter: Payer: Self-pay | Admitting: Family Medicine

## 2017-06-14 VITALS — BP 121/71 | HR 70 | Temp 97.9°F | Resp 18 | Wt 203.6 lb

## 2017-06-14 DIAGNOSIS — E785 Hyperlipidemia, unspecified: Secondary | ICD-10-CM

## 2017-06-14 DIAGNOSIS — E669 Obesity, unspecified: Secondary | ICD-10-CM | POA: Diagnosis not present

## 2017-06-14 DIAGNOSIS — Z23 Encounter for immunization: Secondary | ICD-10-CM | POA: Diagnosis not present

## 2017-06-14 DIAGNOSIS — S13101A Dislocation of unspecified cervical vertebrae, initial encounter: Secondary | ICD-10-CM | POA: Diagnosis not present

## 2017-06-14 DIAGNOSIS — E039 Hypothyroidism, unspecified: Secondary | ICD-10-CM

## 2017-06-14 DIAGNOSIS — I10 Essential (primary) hypertension: Secondary | ICD-10-CM | POA: Diagnosis not present

## 2017-06-14 DIAGNOSIS — E559 Vitamin D deficiency, unspecified: Secondary | ICD-10-CM

## 2017-06-14 LAB — COMPREHENSIVE METABOLIC PANEL
ALT: 12 U/L (ref 0–35)
AST: 14 U/L (ref 0–37)
Albumin: 4.2 g/dL (ref 3.5–5.2)
Alkaline Phosphatase: 91 U/L (ref 39–117)
BUN: 8 mg/dL (ref 6–23)
CHLORIDE: 100 meq/L (ref 96–112)
CO2: 24 meq/L (ref 19–32)
CREATININE: 0.48 mg/dL (ref 0.40–1.20)
Calcium: 9.1 mg/dL (ref 8.4–10.5)
GFR: 131.17 mL/min (ref >60.00)
GLUCOSE: 113 mg/dL — AB (ref 70–99)
Potassium: 4.1 mEq/L (ref 3.5–5.1)
SODIUM: 132 meq/L — AB (ref 135–145)
Total Bilirubin: 0.4 mg/dL (ref 0.2–1.2)
Total Protein: 6.7 g/dL (ref 6.0–8.3)

## 2017-06-14 LAB — LIPID PANEL
CHOL/HDL RATIO: 4
CHOLESTEROL: 193 mg/dL (ref 0–200)
HDL: 54.5 mg/dL (ref >39.00)
LDL Cholesterol: 110 mg/dL — ABNORMAL HIGH (ref 0–99)
NonHDL: 138.9
TRIGLYCERIDES: 143 mg/dL (ref 0.0–149.0)
VLDL: 28.6 mg/dL (ref 0.0–40.0)

## 2017-06-14 LAB — CBC
HCT: 40.5 % (ref 36.0–46.0)
Hemoglobin: 13.2 g/dL (ref 12.0–15.0)
MCHC: 32.6 g/dL (ref 30.0–36.0)
MCV: 95.1 fl (ref 78.0–100.0)
Platelets: 234 10*3/uL (ref 150.0–400.0)
RBC: 4.26 Mil/uL (ref 3.87–5.11)
RDW: 13.5 % (ref 11.5–15.5)
WBC: 5.1 10*3/uL (ref 4.0–10.5)

## 2017-06-14 LAB — VITAMIN D 25 HYDROXY (VIT D DEFICIENCY, FRACTURES): VITD: 30.68 ng/mL (ref 30.00–100.00)

## 2017-06-14 LAB — TSH: TSH: 1.04 u[IU]/mL (ref 0.35–4.50)

## 2017-06-14 NOTE — Assessment & Plan Note (Signed)
Vitamin D daily

## 2017-06-14 NOTE — Assessment & Plan Note (Signed)
Encouraged DASH diet, decrease po intake and increase exercise as tolerated. Needs 7-8 hours of sleep nightly. Avoid trans fats, eat small, frequent meals every 4-5 hours with lean proteins, complex carbs and healthy fats. Minimize simple carbs 

## 2017-06-14 NOTE — Patient Instructions (Signed)

## 2017-06-14 NOTE — Assessment & Plan Note (Signed)
Encouraged heart healthy diet, increase exercise, avoid trans fats, consider a krill oil cap daily 

## 2017-06-14 NOTE — Assessment & Plan Note (Signed)
On Levothyroxine, continue to monitor 
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
## 2017-06-14 NOTE — Assessment & Plan Note (Signed)
Well controlled, no changes to meds. Encouraged heart healthy diet such as the DASH diet and exercise as tolerated.  °
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
## 2017-06-24 ENCOUNTER — Encounter: Payer: Self-pay | Admitting: Family Medicine

## 2017-06-24 ENCOUNTER — Telehealth: Payer: Self-pay | Admitting: Family Medicine

## 2017-06-24 MED ORDER — ZOSTER VAC RECOMB ADJUVANTED 50 MCG/0.5ML IM SUSR: 0.5000 mL | Freq: Once | INTRAMUSCULAR | 0 refills | Status: AC

## 2017-06-24 NOTE — Telephone Encounter (Signed)
error:[id number] ° °

## 2017-06-24 NOTE — Telephone Encounter (Signed)
Relation to IF:OYDX Call back number:[phone]  Reason for call:  Patient received 1st shingle injection 3 months ago at her local pharmacy, patient requesting Rx for the second injection, please send script to    [place], [place] - [address] [phone] (Phone) [phone] (Fax)

## 2017-06-25 NOTE — Telephone Encounter (Signed)
Sent rx over to pharmacy  Patient made aware

## 2017-07-21 DIAGNOSIS — I10 Essential (primary) hypertension: Secondary | ICD-10-CM | POA: Diagnosis not present

## 2017-07-21 DIAGNOSIS — Z6833 Body mass index (BMI) 33.0-33.9, adult: Secondary | ICD-10-CM | POA: Diagnosis not present

## 2017-07-21 DIAGNOSIS — S129XXA Fracture of neck, unspecified, initial encounter: Secondary | ICD-10-CM | POA: Diagnosis not present

## 2017-07-27 DIAGNOSIS — H02204 Unspecified lagophthalmos left upper eyelid: Secondary | ICD-10-CM | POA: Diagnosis not present

## 2017-07-27 DIAGNOSIS — H02201 Unspecified lagophthalmos right upper eyelid: Secondary | ICD-10-CM | POA: Diagnosis not present

## 2017-07-27 DIAGNOSIS — R2681 Unsteadiness on feet: Secondary | ICD-10-CM | POA: Diagnosis not present

## 2017-07-27 DIAGNOSIS — H02202 Unspecified lagophthalmos right lower eyelid: Secondary | ICD-10-CM | POA: Diagnosis not present

## 2017-07-27 DIAGNOSIS — S129XXD Fracture of neck, unspecified, subsequent encounter: Secondary | ICD-10-CM | POA: Diagnosis not present

## 2017-07-27 DIAGNOSIS — H353131 Nonexudative age-related macular degeneration, bilateral, early dry stage: Secondary | ICD-10-CM | POA: Diagnosis not present

## 2017-07-27 DIAGNOSIS — H40013 Open angle with borderline findings, low risk, bilateral: Secondary | ICD-10-CM | POA: Diagnosis not present

## 2017-07-27 DIAGNOSIS — R293 Abnormal posture: Secondary | ICD-10-CM | POA: Diagnosis not present

## 2017-07-27 DIAGNOSIS — H02205 Unspecified lagophthalmos left lower eyelid: Secondary | ICD-10-CM | POA: Diagnosis not present

## 2017-07-29 DIAGNOSIS — R2681 Unsteadiness on feet: Secondary | ICD-10-CM | POA: Diagnosis not present

## 2017-07-29 DIAGNOSIS — R293 Abnormal posture: Secondary | ICD-10-CM | POA: Diagnosis not present

## 2017-07-29 DIAGNOSIS — S129XXD Fracture of neck, unspecified, subsequent encounter: Secondary | ICD-10-CM | POA: Diagnosis not present

## 2017-08-02 DIAGNOSIS — R293 Abnormal posture: Secondary | ICD-10-CM | POA: Diagnosis not present

## 2017-08-02 DIAGNOSIS — R2681 Unsteadiness on feet: Secondary | ICD-10-CM | POA: Diagnosis not present

## 2017-08-02 DIAGNOSIS — S129XXD Fracture of neck, unspecified, subsequent encounter: Secondary | ICD-10-CM | POA: Diagnosis not present

## 2017-08-04 DIAGNOSIS — R293 Abnormal posture: Secondary | ICD-10-CM | POA: Diagnosis not present

## 2017-08-04 DIAGNOSIS — S129XXD Fracture of neck, unspecified, subsequent encounter: Secondary | ICD-10-CM | POA: Diagnosis not present

## 2017-08-04 DIAGNOSIS — R2681 Unsteadiness on feet: Secondary | ICD-10-CM | POA: Diagnosis not present

## 2017-08-09 DIAGNOSIS — S129XXD Fracture of neck, unspecified, subsequent encounter: Secondary | ICD-10-CM | POA: Diagnosis not present

## 2017-08-09 DIAGNOSIS — R2681 Unsteadiness on feet: Secondary | ICD-10-CM | POA: Diagnosis not present

## 2017-08-09 DIAGNOSIS — R293 Abnormal posture: Secondary | ICD-10-CM | POA: Diagnosis not present

## 2017-10-14 DIAGNOSIS — L820 Inflamed seborrheic keratosis: Secondary | ICD-10-CM | POA: Diagnosis not present

## 2017-10-14 DIAGNOSIS — L814 Other melanin hyperpigmentation: Secondary | ICD-10-CM | POA: Diagnosis not present

## 2017-10-14 DIAGNOSIS — L818 Other specified disorders of pigmentation: Secondary | ICD-10-CM | POA: Diagnosis not present

## 2017-10-14 DIAGNOSIS — L821 Other seborrheic keratosis: Secondary | ICD-10-CM | POA: Diagnosis not present

## 2017-10-14 NOTE — Progress Notes (Signed)
Subjective:   Catherine Coleman is a 84 y.o. female who presents for an Initial Medicare Annual Wellness Visit. The Patient was informed that the wellness visit is to identify future health risk and educate and initiate measures that can reduce risk for increased disease through the lifespan.   Describes health as fair, good or great? Very good.  Review of Systems   No ROS.  Medicare Wellness Visit. Additional risk factors are reflected in the social history. Cardiac Risk Factors include: advanced age (>48men, >64 women) Sleep patterns:   Home Safety/Smoke Alarms: Feels safe in home. Smoke alarms in place.  Living environment; residence and Adult nurse: Lives at [facility] Safety/Bike Helmet: Wears seat belt.   Female:   Pap- No longer doing routine screening due to age.      Mammo- Pt reports last in May 2018.      Dexa scan- Last 04/15/16-osteopenia        CCS- last 03/06/13: diverticulosis. No polyps. No recall on file.   Objective:    Today's Vitals   10/18/17 1015  BP: 130/80  Pulse: 64  Resp: 16  Temp: 97.6 F (36.4 C)  TempSrc: Oral  SpO2: 95%  Weight: 208 lb 12.8 oz (94.7 kg)  Height: 5' 4.96" (1.65 m)   Body mass index is 34.79 kg/m.  Advanced Directives 10/18/2017 05/14/2017 05/14/2017 04/30/2017 04/27/2017 04/26/2017 03/22/2015  Does Patient Have a Medical Advance Directive? No;Yes - Yes Yes No No Yes  Type of Paramedic of [place];Living will [place];Living will [place];Living will [place];Living will - - [place];Living will  Does patient want to make changes to medical advance directive? - No - Patient declined - No - Patient declined - - No - Patient declined  Copy of [place] in Chart? Yes Yes - No - copy requested - - No - copy requested  Would patient like information on creating a medical advance directive? - - - No - Patient  declined No - Patient declined - -    Current Medications (verified) Outpatient Encounter Medications as of 10/18/2017  Medication Sig  . brimonidine (ALPHAGAN) 0.2 % ophthalmic solution Place 1 drop into both eyes 2 (two) times daily.  . Cholecalciferol (VITAMIN D) 2000 units CAPS Take 1 capsule (2,000 Units total) by mouth daily.  Marland Kitchen levothyroxine (SYNTHROID, LEVOTHROID) 100 MCG tablet Take 1 tablet (100 mcg total) by mouth daily before breakfast.  . losartan (COZAAR) 50 MG tablet Take 1 tablet (50 mg total) by mouth daily.  . Multiple Vitamins-Minerals (MULTIVITAMIN WITH MINERALS) tablet Take 1 tablet by mouth daily.  Marland Kitchen omeprazole (PRILOSEC) 20 MG capsule Take 1 capsule (20 mg total) by mouth daily.  . polyethylene glycol (MIRALAX / GLYCOLAX) packet Take 17 g by mouth daily.  . ranitidine (ZANTAC) 150 MG tablet Take 1 tablet (150 mg total) by mouth 2 (two) times daily as needed for heartburn.   No facility-administered encounter medications on file as of 10/18/2017.     Allergies (verified) Other   History: Past Medical History:  Diagnosis Date  . Arthritis   . Atrophic rhinitis 04/21/2016  . Diverticulitis   . GERD (gastroesophageal reflux disease)   . Glaucoma   . Hiatal hernia with gastroesophageal reflux   . History of blood transfusion    for Knee replacement  . Hyperlipidemia, mild 04/02/2016  . Hypertension   . Hypothyroidism   . Migraine  aura without headache   . Obesity 04/02/2016  . Parotiditis 04/21/2016  . Vitamin D deficiency 04/02/2016   Past Surgical History:  Procedure Laterality Date  . APPENDECTOMY    . cataract surgery  2008  . CHOLECYSTECTOMY  2010  . JOINT REPLACEMENT Left    2012  . POSTERIOR CERVICAL FUSION/FORAMINOTOMY N/A 04/27/2017   Procedure: CERVICAL FIVE-SIX  OPEN REDUCTION OF FRACTURE, CERVICAL THREE-SEVEN  DORSAL FIXATION AND FUSION;  Surgeon: Ditty, Kevan Ny, MD;  Location: [facility];  Service: Neurosurgery;  Laterality: N/A;  . SHOULDER  ARTHROSCOPY Bilateral prior to 2010  . TONSILLECTOMY    . TOTAL HIP ARTHROPLASTY Right 03/22/2015   Procedure: RIGHT TOTAL HIP ARTHROPLASTY ANTERIOR APPROACH;  Surgeon: Dorna Leitz, MD;  Location: [facility];  Service: Orthopedics;  Laterality: Right;  . TOTAL KNEE ARTHROPLASTY Bilateral 2007  . TUBAL LIGATION     Family History  Problem Relation Age of Onset  . Dementia Mother   . Hypertension Mother   . Heart disease Father   . Hypertension Father   . Stroke Father   . Diabetes Father   . Arthritis Daughter   . Cancer Maternal Grandfather        colon cancer   Social History   Socioeconomic History  . Marital status: Widowed    Spouse name: None  . Number of children: None  . Years of education: None  . Highest education level: None  Social Needs  . Financial resource strain: None  . Food insecurity - worry: None  . Food insecurity - inability: None  . Transportation needs - medical: None  . Transportation needs - non-medical: None  Occupational History  . Occupation: Retired  Tobacco Use  . Smoking status: Former Research scientist (life sciences)  . Smokeless tobacco: Never Used  . Tobacco comment: Stopped 50 years ago.   Substance and Sexual Activity  . Alcohol use: Yes    Alcohol/week: 0.0 oz    Comment: socially  . Drug use: No  . Sexual activity: No  Other Topics Concern  . None  Social History Narrative   Lives at [facility], widowed 7 years, no dietary restrictions. Retired from neuropsychiatry work.     Tobacco Counseling Counseling given: Not Answered Comment: Stopped 50 years ago.    Clinical Intake: Pain : No/denies pain   Activities of Daily Living In your present state of health, do you have any difficulty performing the following activities: 10/18/2017 05/14/2017  Hearing? N Y  Comment wears hearing aids -  Vision? N N  Comment hx cataract sx. Dr.Digby every 6 months. -  Difficulty concentrating or making decisions? N N  Walking or climbing stairs? N Y  Dressing or  bathing? N N  Doing errands, shopping? N Y  Conservation officer, nature and eating ? N -  Using the Toilet? N -  In the past six months, have you accidently leaked urine? Y -  Do you have problems with loss of bowel control? N -  Managing your Medications? N -  Managing your Finances? N -  Housekeeping or managing your Housekeeping? N -  Some recent data might be hidden     Immunizations and Health Maintenance Immunization History  Administered Date(s) Administered  . Influenza, High Dose Seasonal PF 06/14/2017  . Influenza-Unspecified 06/12/2014, 06/24/2015  . Pneumococcal Conjugate-13 10/17/2013, 04/30/2015  . Pneumococcal Polysaccharide-23 09/14/1998, 09/14/2008  . Tetanus 10/17/2013  . Zoster Recombinat (Shingrix) 01/26/2017   There are no preventive care reminders to display for this patient.  Patient Care Team: [name], MD as PCP - General (Family Medicine)  Indicate any recent Medical Services you may have received from other than [facility] providers in the past year (date may be approximate).     Assessment:   This is a routine wellness examination for [place]. Physical assessment deferred to PCP.  Hearing/Vision screen Hearing Screening Comments: Able to hear conversational tones w/o difficulty. No issues reported.   Vision Screening Comments: Follows every 6 months with Dr.Digby.  Dietary issues and exercise activities discussed: Current Exercise Habits: Structured exercise class, Time (Minutes): 60, Frequency (Times/Week): 5, Weekly Exercise (Minutes/Week): 300, Intensity: Moderate Diet (meal preparation, eat out, water intake, caffeinated beverages, dairy products, fruits and vegetables): in general, a "healthy" diet    Goals    . Patient Stated (pt-stated)     Eat healthier and continue with 5 days/week of exercise.      Depression Screen PHQ 2/9 Scores 10/18/2017 06/09/2017  PHQ - 2 Score 0 1  PHQ- 9 Score - 8    Fall Risk Fall Risk  10/18/2017 06/09/2017  Falls  in the past year? No Yes  Number falls in past yr: - 1  Injury with Fall? - Yes  Risk for fall due to : - Impaired balance/gait;Impaired mobility    Cognitive Function: MMSE - Mini Mental State Exam 10/18/2017  Orientation to time 5  Orientation to Place 5  Registration 3  Attention/ Calculation 5  Recall 3  Language- name 2 objects 2  Language- repeat 1  Language- follow 3 step command 3  Language- read & follow direction 1  Write a sentence 1  Copy design 1  Total score 30        Screening Tests Health Maintenance  Topic Date Due  . TETANUS/TDAP  10/18/2023  . INFLUENZA VACCINE  Completed  . DEXA SCAN  Completed  . PNA vac Low Risk Adult  Completed     Plan:   Follow up with Dr.Blyth as directed  Continue to eat heart healthy diet (full of fruits, vegetables, whole grains, lean protein, water--limit salt, fat, and sugar intake) and increase physical activity as tolerated.  Continue doing brain stimulating activities (puzzles, reading, adult coloring books, staying active) to keep memory sharp.     I have personally reviewed and noted the following in the patient's chart:   . Medical and social history . Use of alcohol, tobacco or illicit drugs  . Current medications and supplements . Functional ability and status . Nutritional status . Physical activity . Advanced directives . List of other physicians . Hospitalizations, surgeries, and ER visits in previous 12 months . Vitals . Screenings to include cognitive, depression, and falls . Referrals and appointments  In addition, I have reviewed and discussed with patient certain preventive protocols, quality metrics, and best practice recommendations. A written personalized care plan for preventive services as well as general preventive health recommendations were provided to patient.     Shela Nevin, [place]   10/18/2017

## 2017-10-18 ENCOUNTER — Encounter: Payer: Self-pay | Admitting: Family Medicine

## 2017-10-18 ENCOUNTER — Ambulatory Visit (INDEPENDENT_AMBULATORY_CARE_PROVIDER_SITE_OTHER): Payer: Medicare Other | Admitting: Family Medicine

## 2017-10-18 ENCOUNTER — Ambulatory Visit: Payer: Medicare Other | Admitting: *Deleted

## 2017-10-18 VITALS — BP 130/80 | HR 64 | Temp 97.6°F | Resp 16 | Ht 64.96 in | Wt 208.8 lb

## 2017-10-18 DIAGNOSIS — E785 Hyperlipidemia, unspecified: Secondary | ICD-10-CM | POA: Diagnosis not present

## 2017-10-18 DIAGNOSIS — E039 Hypothyroidism, unspecified: Secondary | ICD-10-CM | POA: Diagnosis not present

## 2017-10-18 DIAGNOSIS — J329 Chronic sinusitis, unspecified: Secondary | ICD-10-CM | POA: Diagnosis not present

## 2017-10-18 DIAGNOSIS — E559 Vitamin D deficiency, unspecified: Secondary | ICD-10-CM | POA: Diagnosis not present

## 2017-10-18 DIAGNOSIS — N3281 Overactive bladder: Secondary | ICD-10-CM | POA: Insufficient documentation

## 2017-10-18 DIAGNOSIS — R5381 Other malaise: Secondary | ICD-10-CM

## 2017-10-18 DIAGNOSIS — J309 Allergic rhinitis, unspecified: Secondary | ICD-10-CM

## 2017-10-18 DIAGNOSIS — R739 Hyperglycemia, unspecified: Secondary | ICD-10-CM

## 2017-10-18 DIAGNOSIS — Z0001 Encounter for general adult medical examination with abnormal findings: Secondary | ICD-10-CM | POA: Diagnosis not present

## 2017-10-18 DIAGNOSIS — I10 Essential (primary) hypertension: Secondary | ICD-10-CM

## 2017-10-18 DIAGNOSIS — R32 Unspecified urinary incontinence: Secondary | ICD-10-CM | POA: Diagnosis not present

## 2017-10-18 LAB — COMPREHENSIVE METABOLIC PANEL
ALBUMIN: 4.1 g/dL (ref 3.5–5.2)
ALT: 12 U/L (ref 0–35)
AST: 14 U/L (ref 0–37)
Alkaline Phosphatase: 90 U/L (ref 39–117)
BUN: 17 mg/dL (ref 6–23)
CHLORIDE: 99 meq/L (ref 96–112)
CO2: 27 meq/L (ref 19–32)
CREATININE: 0.63 mg/dL (ref 0.40–1.20)
Calcium: 9.1 mg/dL (ref 8.4–10.5)
GFR: 95.76 mL/min (ref >60.00)
Glucose, Bld: 102 mg/dL — ABNORMAL HIGH (ref 70–99)
Potassium: 4.3 mEq/L (ref 3.5–5.1)
SODIUM: 135 meq/L (ref 135–145)
Total Bilirubin: 0.4 mg/dL (ref 0.2–1.2)
Total Protein: 6.8 g/dL (ref 6.0–8.3)

## 2017-10-18 LAB — URINALYSIS
BILIRUBIN URINE: NEGATIVE
Hgb urine dipstick: NEGATIVE
Ketones, ur: NEGATIVE
LEUKOCYTES UA: NEGATIVE
NITRITE: NEGATIVE
PH: 6 (ref 5.0–8.0)
SPECIFIC GRAVITY, URINE: 1.01 (ref 1.000–1.030)
Total Protein, Urine: NEGATIVE
Urine Glucose: NEGATIVE
Urobilinogen, UA: 0.2 (ref 0.0–1.0)

## 2017-10-18 LAB — LIPID PANEL
CHOLESTEROL: 200 mg/dL (ref 0–200)
HDL: 60.5 mg/dL (ref >39.00)
LDL CALC: 102 mg/dL — AB (ref 0–99)
NonHDL: 139.37
TRIGLYCERIDES: 185 mg/dL — AB (ref 0.0–149.0)
Total CHOL/HDL Ratio: 3
VLDL: 37 mg/dL (ref 0.0–40.0)

## 2017-10-18 LAB — T4, FREE: FREE T4: 0.87 ng/dL (ref 0.60–1.60)

## 2017-10-18 LAB — CBC
HEMATOCRIT: 41.1 % (ref 36.0–46.0)
HEMOGLOBIN: 13.9 g/dL (ref 12.0–15.0)
MCHC: 33.7 g/dL (ref 30.0–36.0)
MCV: 94.9 fl (ref 78.0–100.0)
Platelets: 234 10*3/uL (ref 150.0–400.0)
RBC: 4.33 Mil/uL (ref 3.87–5.11)
RDW: 13.2 % (ref 11.5–15.5)
WBC: 5.1 10*3/uL (ref 4.0–10.5)

## 2017-10-18 LAB — TSH: TSH: 2.46 u[IU]/mL (ref 0.35–4.50)

## 2017-10-18 LAB — HEMOGLOBIN A1C: HEMOGLOBIN A1C: 6.4 % (ref 4.6–6.5)

## 2017-10-18 MED ORDER — RANITIDINE HCL 150 MG PO TABS: 150.0000 mg | ORAL_TABLET | Freq: Two times a day (BID) | ORAL | 2 refills | Status: DC | PRN

## 2017-10-18 NOTE — Assessment & Plan Note (Signed)
Encouraged heart healthy diet, increase exercise, avoid trans fats, consider a krill oil cap daily 

## 2017-10-18 NOTE — Assessment & Plan Note (Signed)
Check level today, she is only sure she has been taking it for a week

## 2017-10-18 NOTE — Assessment & Plan Note (Signed)
Well controlled, no changes to meds. Encouraged heart healthy diet such as the DASH diet and exercise as tolerated.  °

## 2017-10-18 NOTE — Assessment & Plan Note (Signed)
On Levothyroxine, continue to monitor 
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
## 2017-10-18 NOTE — Patient Instructions (Addendum)
Encouraged increased rest and hydration, add probiotics, zinc such as Coldeze or Xicam. Treat fevers as needed. Vitamin C 300 to 1000 mg daily. Elderberry daily and Umcka daily during episodes. Plain Mucinex twice daily   Start the Ranitidine 150 mg at bed time, if heartburn improves then can increase to 300 mg at bed and stop the Omeprazole    Catherine Coleman , Thank you for taking time to come for your Medicare Wellness Visit. I appreciate your ongoing commitment to your health goals. Please review the following plan we discussed and let me know if I can assist you in the future.   These are the goals we discussed: Goals    . Patient Stated (pt-stated)     Eat healthier and continue with 5 days/week of exercise.       This is a list of the screening recommended for you and due dates:  Health Maintenance  Topic Date Due  . Tetanus Vaccine  10/18/2023  . Flu Shot  Completed  . DEXA scan (bone density measurement)  Completed  . Pneumonia vaccines  Completed    Sinusitis, Adult Sinusitis is soreness and inflammation of your sinuses. Sinuses are hollow spaces in the bones around your face. They are located:  Around your eyes.  In the middle of your forehead.  Behind your nose.  In your cheekbones.  Your sinuses and nasal passages are lined with a stringy fluid (mucus). Mucus normally drains out of your sinuses. When your nasal tissues get inflamed or swollen, the mucus can get trapped or blocked so air cannot flow through your sinuses. This lets bacteria, viruses, and funguses grow, and that leads to infection. Follow these instructions at home: Medicines  Take, use, or apply over-the-counter and prescription medicines only as told by your doctor. These may include nasal sprays.  If you were prescribed an antibiotic medicine, take it as told by your doctor. Do not stop taking the antibiotic even if you start to feel better. Hydrate and Humidify  Drink enough water to keep your pee  (urine) clear or pale yellow.  Use a cool mist humidifier to keep the humidity level in your home above 50%.  Breathe in steam for 10-15 minutes, 3-4 times a day or as told by your doctor. You can do this in the bathroom while a hot shower is running.  Try not to spend time in cool or dry air. Rest  Rest as much as possible.  Sleep with your head raised (elevated).  Make sure to get enough sleep each night. General instructions  Put a warm, moist washcloth on your face 3-4 times a day or as told by your doctor. This will help with discomfort.  Wash your hands often with soap and water. If there is no soap and water, use hand sanitizer.  Do not smoke. Avoid being around people who are smoking (secondhand smoke).  Keep all follow-up visits as told by your doctor. This is important. Contact a doctor if:  You have a fever.  Your symptoms get worse.  Your symptoms do not get better within 10 days. Get help right away if:  You have a very bad headache.  You cannot stop throwing up (vomiting).  You have pain or swelling around your face or eyes.  You have trouble seeing.  You feel confused.  Your neck is stiff.  You have trouble breathing. This information is not intended to replace advice given to you by your health care provider. Make sure  you discuss any questions you have with your health care provider. Document Released: 02/17/2008 Document Revised: 04/26/2016 Document Reviewed: 06/26/2015 Elsevier Interactive Patient Education  [employer].
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
## 2017-10-19 LAB — URINE CULTURE
MICRO NUMBER:: 90146877
SPECIMEN QUALITY:: ADEQUATE

## 2017-10-21 ENCOUNTER — Telehealth: Payer: Self-pay | Admitting: Family Medicine

## 2017-10-21 NOTE — Telephone Encounter (Signed)
Copied from [place]. Topic: Quick Communication - Rx Refill/Question >> Oct 21, 2017  1:48 PM Scherrie Gerlach wrote: Medication: omeprazole (PRILOSEC) 20 MG capsule                     Losartan (COZAAR) 50 MG tablet                   Levothyroxine (SYNTHROID, LEVOTHROID) 100 MCG tablet   Has the patient contacted their pharmacy? {yes but Dr Randel Pigg has never filled this med.  Pt established with her in 04/2017 Pt needs new scripts [place], [place] - [address] [phone] (Phone) [phone] (Fax)

## 2017-10-22 MED ORDER — LEVOTHYROXINE SODIUM 100 MCG PO TABS: 100.0000 ug | ORAL_TABLET | Freq: Every day | ORAL | 1 refills | Status: DC

## 2017-10-22 MED ORDER — OMEPRAZOLE 20 MG PO CPDR: 20.0000 mg | DELAYED_RELEASE_CAPSULE | Freq: Every day | ORAL | 1 refills | Status: DC

## 2017-10-22 MED ORDER — LOSARTAN POTASSIUM 50 MG PO TABS: 50.0000 mg | ORAL_TABLET | Freq: Every day | ORAL | 1 refills | Status: DC

## 2017-10-22 MED ORDER — LEVOTHYROXINE SODIUM 100 MCG PO TABS: 100.0000 ug | ORAL_TABLET | Freq: Every day | ORAL | 0 refills | Status: DC

## 2017-10-22 MED ORDER — LOSARTAN POTASSIUM 50 MG PO TABS: 50.0000 mg | ORAL_TABLET | Freq: Every day | ORAL | 0 refills | Status: DC

## 2017-10-22 MED ORDER — OMEPRAZOLE 20 MG PO CPDR: 20.0000 mg | DELAYED_RELEASE_CAPSULE | Freq: Every day | ORAL | 0 refills | Status: DC

## 2017-10-22 NOTE — Telephone Encounter (Signed)
rx sent

## 2017-10-22 NOTE — Telephone Encounter (Signed)
Re sent medications to pharmacy  

## 2017-10-22 NOTE — Addendum Note (Signed)
Addended by: Magdalene Molly A on: 10/22/2017 04:33 PM   Modules accepted: Orders

## 2017-10-22 NOTE — Telephone Encounter (Signed)
Patient states pharmacy did not receive refills. Please advise. Call back [phone] once sent again

## 2017-10-24 DIAGNOSIS — J329 Chronic sinusitis, unspecified: Secondary | ICD-10-CM | POA: Insufficient documentation
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
## 2017-10-24 NOTE — Assessment & Plan Note (Signed)
With significant allergic symptoms. Referred to ENT for further consideration.

## 2017-10-27 DIAGNOSIS — M6281 Muscle weakness (generalized): Secondary | ICD-10-CM | POA: Diagnosis not present

## 2017-10-27 DIAGNOSIS — N8110 Cystocele, unspecified: Secondary | ICD-10-CM | POA: Diagnosis not present

## 2017-10-27 DIAGNOSIS — N393 Stress incontinence (female) (male): Secondary | ICD-10-CM | POA: Diagnosis not present

## 2017-11-02 DIAGNOSIS — M6281 Muscle weakness (generalized): Secondary | ICD-10-CM | POA: Diagnosis not present

## 2017-11-02 DIAGNOSIS — N8110 Cystocele, unspecified: Secondary | ICD-10-CM | POA: Diagnosis not present

## 2017-11-02 DIAGNOSIS — N393 Stress incontinence (female) (male): Secondary | ICD-10-CM | POA: Diagnosis not present

## 2017-11-04 ENCOUNTER — Ambulatory Visit (INDEPENDENT_AMBULATORY_CARE_PROVIDER_SITE_OTHER): Payer: Medicare Other | Admitting: Family Medicine

## 2017-11-04 ENCOUNTER — Encounter: Payer: Self-pay | Admitting: Family Medicine

## 2017-11-04 VITALS — BP 108/78 | HR 96 | Temp 97.4°F | Resp 16 | Ht 64.0 in | Wt 191.4 lb

## 2017-11-04 DIAGNOSIS — J302 Other seasonal allergic rhinitis: Secondary | ICD-10-CM | POA: Diagnosis not present

## 2017-11-04 MED ORDER — LEVOCETIRIZINE DIHYDROCHLORIDE 5 MG PO TABS: 5.0000 mg | ORAL_TABLET | Freq: Every evening | ORAL | 5 refills | Status: DC

## 2017-11-04 MED ORDER — FLUTICASONE PROPIONATE 50 MCG/ACT NA SUSP: 2.0000 | Freq: Every day | NASAL | 6 refills | Status: DC

## 2017-11-04 MED FILL — FLUTICASONE PROP 50 MCG SPR: 50 | 30 days supply | Qty: 16 | Fill #0

## 2017-11-04 MED FILL — LEVOCETIRIZINE 5 MG TABLET: 5 | 30 days supply | Qty: 30 | Fill #0

## 2017-11-04 NOTE — Patient Instructions (Signed)

## 2017-11-04 NOTE — Progress Notes (Signed)
Patient ID: Catherine Coleman, female   DOB: Mar 30, 1932, 84 y.o.   MRN: [id number]    Subjective:  I acted as a Education administrator for Dr. Carollee Herter.  Guerry Bruin, [place]   Patient ID: Catherine Coleman, female    DOB: 07/04/1932, 84 y.o.   MRN: [id number]  Chief Complaint  Patient presents with  . Sinusitis    Sinusitis  This is a recurrent problem. Episode onset: 2 days. Associated symptoms include chills, congestion, coughing, a hoarse voice and sinus pressure. Pertinent negatives include no sneezing or sore throat. Treatments tried: mucinex.    Patient is in today for sinus problems.  She states that she has recurrent sinus stuff every few weeks.    Patient Care Team: [name], MD as PCP - General (Family Medicine)   Past Medical History:  Diagnosis Date  . Arthritis   . Atrophic rhinitis 04/21/2016  . Diverticulitis   . GERD (gastroesophageal reflux disease)   . Glaucoma   . Hiatal hernia with gastroesophageal reflux   . History of blood transfusion    for Knee replacement  . Hyperlipidemia, mild 04/02/2016  . Hypertension   . Hypothyroidism   . Migraine aura without headache   . Obesity 04/02/2016  . Parotiditis 04/21/2016  . Vitamin D deficiency 04/02/2016    Past Surgical History:  Procedure Laterality Date  . APPENDECTOMY    . cataract surgery  2008  . CHOLECYSTECTOMY  2010  . JOINT REPLACEMENT Left    2012  . POSTERIOR CERVICAL FUSION/FORAMINOTOMY N/A 04/27/2017   Procedure: CERVICAL FIVE-SIX  OPEN REDUCTION OF FRACTURE, CERVICAL THREE-SEVEN  DORSAL FIXATION AND FUSION;  Surgeon: Ditty, Kevan Ny, MD;  Location: [facility];  Service: Neurosurgery;  Laterality: N/A;  . SHOULDER ARTHROSCOPY Bilateral prior to 2010  . TONSILLECTOMY    . TOTAL HIP ARTHROPLASTY Right 03/22/2015   Procedure: RIGHT TOTAL HIP ARTHROPLASTY ANTERIOR APPROACH;  Surgeon: Dorna Leitz, MD;  Location: [facility];  Service: Orthopedics;  Laterality: Right;  . TOTAL KNEE ARTHROPLASTY Bilateral 2007  . TUBAL LIGATION        Family History  Problem Relation Age of Onset  . Dementia Mother   . Hypertension Mother   . Heart disease Father   . Hypertension Father   . Stroke Father   . Diabetes Father   . Arthritis Daughter   . Cancer Maternal Grandfather        colon cancer    Social History   Socioeconomic History  . Marital status: Widowed    Spouse name: Not on file  . Number of children: Not on file  . Years of education: Not on file  . Highest education level: Not on file  Social Needs  . Financial resource strain: Not on file  . Food insecurity - worry: Not on file  . Food insecurity - inability: Not on file  . Transportation needs - medical: Not on file  . Transportation needs - non-medical: Not on file  Occupational History  . Occupation: Retired  Tobacco Use  . Smoking status: Former Research scientist (life sciences)  . Smokeless tobacco: Never Used  . Tobacco comment: Stopped 50 years ago.   Substance and Sexual Activity  . Alcohol use: Yes    Alcohol/week: 0.0 oz    Comment: socially  . Drug use: No  . Sexual activity: No  Other Topics Concern  . Not on file  Social History Narrative   Lives at [facility], widowed 7 years, no dietary restrictions. Retired from neuropsychiatry work.  Outpatient Medications Prior to Visit  Medication Sig Dispense Refill  . brimonidine (ALPHAGAN) 0.2 % ophthalmic solution Place 1 drop into both eyes 2 (two) times daily. 5 mL 12  . Cholecalciferol (VITAMIN D) 2000 units CAPS Take 1 capsule (2,000 Units total) by mouth daily. 30 capsule 5  . levothyroxine (SYNTHROID, LEVOTHROID) 100 MCG tablet Take 1 tablet (100 mcg total) by mouth daily before breakfast. 90 tablet 1  . losartan (COZAAR) 50 MG tablet Take 1 tablet (50 mg total) by mouth daily. 90 tablet 1  . Multiple Vitamins-Minerals (MULTIVITAMIN WITH MINERALS) tablet Take 1 tablet by mouth daily.    Marland Kitchen omeprazole (PRILOSEC) 20 MG capsule Take 1 capsule (20 mg total) by mouth daily. 90 capsule 1  . polyethylene  glycol (MIRALAX / GLYCOLAX) packet Take 17 g by mouth daily. 14 each 0  . ranitidine (ZANTAC) 150 MG tablet Take 1 tablet (150 mg total) by mouth 2 (two) times daily as needed for heartburn. 60 tablet 2   No facility-administered medications prior to visit.     Allergies  Allergen Reactions  . Other Other (See Comments)    Seasonal allergies(pollen & grass)    Review of Systems  Constitutional: Positive for chills.  HENT: Positive for congestion, hoarse voice, sinus pressure and sinus pain. Negative for sneezing and sore throat.   Respiratory: Positive for cough.        Objective:    Physical Exam  Constitutional: She is oriented to person, place, and time. She appears well-developed and well-nourished.  HENT:  Right Ear: Tympanic membrane, external ear and ear canal normal.  Left Ear: Tympanic membrane, external ear and ear canal normal.  Nose: Rhinorrhea present. Right sinus exhibits frontal sinus tenderness. Left sinus exhibits frontal sinus tenderness.  Mouth/Throat: Posterior oropharyngeal erythema present.  + PND + errythema  Eyes: Conjunctivae are normal. Right eye exhibits no discharge. Left eye exhibits no discharge.  Cardiovascular: Normal rate, regular rhythm and normal heart sounds.  No murmur heard. Pulmonary/Chest: Effort normal and breath sounds normal. No respiratory distress. She has no wheezes. She has no rales. She exhibits no tenderness.  Musculoskeletal: She exhibits no edema.  Lymphadenopathy:    She has no cervical adenopathy.  Neurological: She is alert and oriented to person, place, and time.  Nursing note and vitals reviewed.   BP 108/78 (BP Location: Right Arm, Cuff Size: Large)   Pulse 96   Temp (!) 97.4 F (36.3 C) (Oral)   Resp 16   Ht 5\' 4"  (1.626 m)   Wt 191 lb 6.4 oz (86.8 kg)   SpO2 93%   BMI 32.85 kg/m  Wt Readings from Last 3 Encounters:  11/04/17 191 lb 6.4 oz (86.8 kg)  10/18/17 208 lb 12.8 oz (94.7 kg)  06/14/17 203 lb 9.6  oz (92.4 kg)   BP Readings from Last 3 Encounters:  11/04/17 108/78  10/18/17 130/80  06/14/17 121/71     Immunization History  Administered Date(s) Administered  . Influenza, High Dose Seasonal PF 06/14/2017  . Influenza-Unspecified 06/12/2014, 06/24/2015  . Pneumococcal Conjugate-13 10/17/2013, 04/30/2015  . Pneumococcal Polysaccharide-23 09/14/1998, 09/14/2008  . Tetanus 10/17/2013  . Zoster Recombinat (Shingrix) 01/26/2017    Health Maintenance  Topic Date Due  . Samul Dada  10/18/2023  . INFLUENZA VACCINE  Completed  . DEXA SCAN  Completed  . PNA vac Low Risk Adult  Completed    Lab Results  Component Value Date   WBC 5.1 10/18/2017   HGB 13.9 10/18/2017  HCT 41.1 10/18/2017   PLT 234.0 10/18/2017   GLUCOSE 102 (H) 10/18/2017   CHOL 200 10/18/2017   TRIG 185.0 (H) 10/18/2017   HDL 60.50 10/18/2017   LDLCALC 102 (H) 10/18/2017   ALT 12 10/18/2017   AST 14 10/18/2017   NA 135 10/18/2017   K 4.3 10/18/2017   CL 99 10/18/2017   CREATININE 0.63 10/18/2017   BUN 17 10/18/2017   CO2 27 10/18/2017   TSH 2.46 10/18/2017   INR 1.05 04/28/2017   HGBA1C 6.4 10/18/2017    Lab Results  Component Value Date   TSH 2.46 10/18/2017   Lab Results  Component Value Date   WBC 5.1 10/18/2017   HGB 13.9 10/18/2017   HCT 41.1 10/18/2017   MCV 94.9 10/18/2017   PLT 234.0 10/18/2017   Lab Results  Component Value Date   NA 135 10/18/2017   K 4.3 10/18/2017   CO2 27 10/18/2017   GLUCOSE 102 (H) 10/18/2017   BUN 17 10/18/2017   CREATININE 0.63 10/18/2017   BILITOT 0.4 10/18/2017   ALKPHOS 90 10/18/2017   AST 14 10/18/2017   ALT 12 10/18/2017   PROT 6.8 10/18/2017   ALBUMIN 4.1 10/18/2017   CALCIUM 9.1 10/18/2017   ANIONGAP 8 05/15/2017   GFR 95.76 10/18/2017   Lab Results  Component Value Date   CHOL 200 10/18/2017   Lab Results  Component Value Date   HDL 60.50 10/18/2017   Lab Results  Component Value Date   LDLCALC 102 (H) 10/18/2017   Lab  Results  Component Value Date   TRIG 185.0 (H) 10/18/2017   Lab Results  Component Value Date   CHOLHDL 3 10/18/2017   Lab Results  Component Value Date   HGBA1C 6.4 10/18/2017        ent  Assessment & Plan:   Problem List Items Addressed This Visit    None    Visit Diagnoses    Seasonal allergies    -  Primary   Relevant Medications   fluticasone (FLONASE) 50 MCG/ACT nasal spray   levocetirizine (XYZAL) 5 MG tablet    ent referral pending   I am having [name] start on fluticasone and levocetirizine. I am also having her maintain her multivitamin with minerals, brimonidine, Vitamin D, polyethylene glycol, ranitidine, omeprazole, levothyroxine, and losartan.  Meds ordered this encounter  Medications  . fluticasone (FLONASE) 50 MCG/ACT nasal spray    Sig: Place 2 sprays into both nostrils daily.    Dispense:  16 g    Refill:  6  . levocetirizine (XYZAL) 5 MG tablet    Sig: Take 1 tablet (5 mg total) by mouth every evening.    Dispense:  30 tablet    Refill:  5    CMA served as scribe during this visit. History, Physical and Plan performed by medical provider. Documentation and orders reviewed and attested to.  Ann Held, DO

## 2017-11-09 ENCOUNTER — Encounter: Payer: Self-pay | Admitting: Family Medicine

## 2017-11-09 DIAGNOSIS — N393 Stress incontinence (female) (male): Secondary | ICD-10-CM | POA: Diagnosis not present

## 2017-11-09 DIAGNOSIS — M6281 Muscle weakness (generalized): Secondary | ICD-10-CM | POA: Diagnosis not present

## 2017-11-09 DIAGNOSIS — N8110 Cystocele, unspecified: Secondary | ICD-10-CM | POA: Diagnosis not present

## 2017-11-23 DIAGNOSIS — N393 Stress incontinence (female) (male): Secondary | ICD-10-CM | POA: Diagnosis not present

## 2017-11-23 DIAGNOSIS — N8110 Cystocele, unspecified: Secondary | ICD-10-CM | POA: Diagnosis not present

## 2017-11-23 DIAGNOSIS — M6281 Muscle weakness (generalized): Secondary | ICD-10-CM | POA: Diagnosis not present

## 2017-11-30 DIAGNOSIS — N393 Stress incontinence (female) (male): Secondary | ICD-10-CM | POA: Diagnosis not present

## 2017-11-30 DIAGNOSIS — N8110 Cystocele, unspecified: Secondary | ICD-10-CM | POA: Diagnosis not present

## 2017-11-30 DIAGNOSIS — M6281 Muscle weakness (generalized): Secondary | ICD-10-CM | POA: Diagnosis not present

## 2017-12-03 ENCOUNTER — Telehealth: Payer: Self-pay | Admitting: *Deleted

## 2017-12-03 NOTE — Telephone Encounter (Signed)
Received request for Medical Records from [facility]; forwarded to [place] for email/scan/SLS 03/22

## 2017-12-07 DIAGNOSIS — N393 Stress incontinence (female) (male): Secondary | ICD-10-CM | POA: Diagnosis not present

## 2017-12-07 DIAGNOSIS — N8110 Cystocele, unspecified: Secondary | ICD-10-CM | POA: Diagnosis not present

## 2017-12-07 DIAGNOSIS — M6281 Muscle weakness (generalized): Secondary | ICD-10-CM | POA: Diagnosis not present

## 2017-12-10 ENCOUNTER — Telehealth: Payer: Self-pay | Admitting: *Deleted

## 2017-12-10 NOTE — Telephone Encounter (Signed)
Received Physician Orders/Plan of Care from [facility]; forwarded to provider/SLS 03/29

## 2017-12-20 ENCOUNTER — Telehealth: Payer: Self-pay | Admitting: *Deleted

## 2017-12-20 NOTE — Telephone Encounter (Signed)
Received Physician Orders from [facility]; forwarded to provider/SLS 04/08

## 2017-12-27 DIAGNOSIS — N393 Stress incontinence (female) (male): Secondary | ICD-10-CM | POA: Diagnosis not present

## 2017-12-27 DIAGNOSIS — M6281 Muscle weakness (generalized): Secondary | ICD-10-CM | POA: Diagnosis not present

## 2017-12-27 DIAGNOSIS — N8110 Cystocele, unspecified: Secondary | ICD-10-CM | POA: Diagnosis not present

## 2018-01-11 DIAGNOSIS — H02202 Unspecified lagophthalmos right lower eyelid: Secondary | ICD-10-CM | POA: Diagnosis not present

## 2018-01-11 DIAGNOSIS — H353131 Nonexudative age-related macular degeneration, bilateral, early dry stage: Secondary | ICD-10-CM | POA: Diagnosis not present

## 2018-01-11 DIAGNOSIS — H02201 Unspecified lagophthalmos right upper eyelid: Secondary | ICD-10-CM | POA: Diagnosis not present

## 2018-01-11 DIAGNOSIS — H02204 Unspecified lagophthalmos left upper eyelid: Secondary | ICD-10-CM | POA: Diagnosis not present

## 2018-01-11 DIAGNOSIS — H40013 Open angle with borderline findings, low risk, bilateral: Secondary | ICD-10-CM | POA: Diagnosis not present

## 2018-01-17 ENCOUNTER — Ambulatory Visit: Payer: Medicare Other | Admitting: Family Medicine

## 2018-01-17 DIAGNOSIS — N8110 Cystocele, unspecified: Secondary | ICD-10-CM | POA: Diagnosis not present

## 2018-01-17 DIAGNOSIS — N393 Stress incontinence (female) (male): Secondary | ICD-10-CM | POA: Diagnosis not present

## 2018-01-17 DIAGNOSIS — M6281 Muscle weakness (generalized): Secondary | ICD-10-CM | POA: Diagnosis not present

## 2018-01-26 ENCOUNTER — Other Ambulatory Visit: Payer: Self-pay | Admitting: Family Medicine

## 2018-01-28 ENCOUNTER — Other Ambulatory Visit: Payer: Self-pay

## 2018-01-28 ENCOUNTER — Ambulatory Visit (INDEPENDENT_AMBULATORY_CARE_PROVIDER_SITE_OTHER): Payer: Medicare Other | Admitting: Family Medicine

## 2018-01-28 ENCOUNTER — Encounter: Payer: Self-pay | Admitting: Family Medicine

## 2018-01-28 VITALS — BP 128/86 | HR 86 | Temp 98.6°F | Resp 16 | Ht 64.0 in | Wt 207.8 lb

## 2018-01-28 DIAGNOSIS — R739 Hyperglycemia, unspecified: Secondary | ICD-10-CM | POA: Diagnosis not present

## 2018-01-28 DIAGNOSIS — Z1159 Encounter for screening for other viral diseases: Secondary | ICD-10-CM

## 2018-01-28 DIAGNOSIS — E039 Hypothyroidism, unspecified: Secondary | ICD-10-CM

## 2018-01-28 DIAGNOSIS — Z8781 Personal history of (healed) traumatic fracture: Secondary | ICD-10-CM

## 2018-01-28 DIAGNOSIS — E559 Vitamin D deficiency, unspecified: Secondary | ICD-10-CM | POA: Diagnosis not present

## 2018-01-28 DIAGNOSIS — R32 Unspecified urinary incontinence: Secondary | ICD-10-CM | POA: Diagnosis not present

## 2018-01-28 DIAGNOSIS — M6289 Other specified disorders of muscle: Secondary | ICD-10-CM

## 2018-01-28 DIAGNOSIS — R1013 Epigastric pain: Secondary | ICD-10-CM | POA: Diagnosis not present

## 2018-01-28 DIAGNOSIS — Z23 Encounter for immunization: Secondary | ICD-10-CM

## 2018-01-28 DIAGNOSIS — I10 Essential (primary) hypertension: Secondary | ICD-10-CM | POA: Diagnosis not present

## 2018-01-28 LAB — LIPID PANEL
CHOL/HDL RATIO: 4
CHOLESTEROL: 230 mg/dL — AB (ref 0–200)
HDL: 59.9 mg/dL (ref >39.00)
NONHDL: 169.74
TRIGLYCERIDES: 212 mg/dL — AB (ref 0.0–149.0)
VLDL: 42.4 mg/dL — ABNORMAL HIGH (ref 0.0–40.0)

## 2018-01-28 LAB — COMPREHENSIVE METABOLIC PANEL
ALBUMIN: 4.5 g/dL (ref 3.5–5.2)
ALT: 14 U/L (ref 0–35)
AST: 17 U/L (ref 0–37)
Alkaline Phosphatase: 86 U/L (ref 39–117)
BUN: 13 mg/dL (ref 6–23)
CHLORIDE: 100 meq/L (ref 96–112)
CO2: 27 meq/L (ref 19–32)
Calcium: 9.6 mg/dL (ref 8.4–10.5)
Creatinine, Ser: 0.67 mg/dL (ref 0.40–1.20)
GFR: 89.13 mL/min (ref >60.00)
Glucose, Bld: 113 mg/dL — ABNORMAL HIGH (ref 70–99)
POTASSIUM: 4.4 meq/L (ref 3.5–5.1)
Sodium: 136 mEq/L (ref 135–145)
Total Bilirubin: 0.4 mg/dL (ref 0.2–1.2)
Total Protein: 7.2 g/dL (ref 6.0–8.3)

## 2018-01-28 LAB — CBC
HEMATOCRIT: 43 % (ref 36.0–46.0)
Hemoglobin: 14.5 g/dL (ref 12.0–15.0)
MCHC: 33.8 g/dL (ref 30.0–36.0)
MCV: 95.6 fl (ref 78.0–100.0)
Platelets: 217 10*3/uL (ref 150.0–400.0)
RBC: 4.5 Mil/uL (ref 3.87–5.11)
RDW: 13.1 % (ref 11.5–15.5)
WBC: 5.4 10*3/uL (ref 4.0–10.5)

## 2018-01-28 LAB — TSH: TSH: 3.62 u[IU]/mL (ref 0.35–4.50)

## 2018-01-28 LAB — HEMOGLOBIN A1C: Hgb A1c MFr Bld: 6.1 % (ref 4.6–6.5)

## 2018-01-28 LAB — H. PYLORI ANTIBODY, IGG: H PYLORI IGG: NEGATIVE

## 2018-01-28 LAB — LDL CHOLESTEROL, DIRECT: Direct LDL: 144 mg/dL

## 2018-01-28 MED ORDER — RANITIDINE HCL 300 MG PO TABS: 300.0000 mg | ORAL_TABLET | Freq: Every day | ORAL | 5 refills | Status: DC

## 2018-01-28 MED ORDER — CEFDINIR 300 MG PO CAPS: 300.0000 mg | ORAL_CAPSULE | Freq: Two times a day (BID) | ORAL | 0 refills | Status: AC

## 2018-01-28 NOTE — Assessment & Plan Note (Signed)
On Levothyroxine, continue to monitor 

## 2018-01-28 NOTE — Patient Instructions (Addendum)
CDC website  Pelvic Organ Prolapse Pelvic organ prolapse is the stretching, bulging, or dropping of pelvic organs into an abnormal position. It happens when the muscles and tissues that surround and support pelvic structures are stretched or weak. Pelvic organ prolapse can involve:  Vagina (vaginal prolapse).  Uterus (uterine prolapse).  Bladder (cystocele).  Rectum (rectocele).  Intestines (enterocele).  When organs other than the vagina are involved, they often bulge into the vagina or protrude from the vagina, depending on how severe the prolapse is. What are the causes? Causes of this condition include:  Pregnancy, labor, and childbirth.  Long-lasting (chronic) cough.  Chronic constipation.  Obesity.  Past pelvic surgery.  Aging. During and after menopause, a decreased production of the hormone estrogen can weaken pelvic ligaments and muscles.  Consistently lifting more than 50 lb (23 kg).  Buildup of fluid in the abdomen due to certain diseases and other conditions.  What are the signs or symptoms? Symptoms of this condition include:  Loss of bladder control when you cough, sneeze, strain, and exercise (stress incontinence). This may be worse immediately following childbirth, and it may gradually improve over time.  Feeling pressure in your pelvis or vagina. This pressure may increase when you cough or when you are having a bowel movement.  A bulge that protrudes from the opening of your vagina or against your vaginal wall. If your uterus protrudes through the opening of your vagina and rubs against your clothing, you may also experience soreness, ulcers, infection, pain, and bleeding.  Increased effort to have a bowel movement or urinate.  Pain in your low back.  Pain, discomfort, or disinterest in sexual intercourse.  Repeated bladder infections (urinary tract infections).  Difficulty inserting or inability to insert a tampon or applicator.  In some  people, this condition does not cause any symptoms. How is this diagnosed? Your health care provider may perform an internal and external vaginal and rectal exam. During the exam, you may be asked to cough and strain while you are lying down, sitting, and standing up. Your health care provider will determine if other tests are required, such as bladder function tests. How is this treated? In most cases, this condition needs to be treated only if it produces symptoms. No treatment is guaranteed to correct the prolapse or relieve the symptoms completely. Treatment may include:  Lifestyle changes, such as: ? Avoiding drinking beverages that contain caffeine. ? Increasing your intake of high-fiber foods. This can help to decrease constipation and straining during bowel movements. ? Emptying your bladder at scheduled times (bladder training therapy). This can help to reduce or avoid urinary incontinence. ? Losing weight if you are overweight or obese.  Estrogen. Estrogen may help mild prolapse by increasing the strength and tone of pelvic floor muscles.  Kegel exercises. These may help mild cases of prolapse by strengthening and tightening the muscles of the pelvic floor.  Pessary insertion. A pessary is a soft, flexible device that is placed into your vagina by your health care provider to help support the vaginal walls and keep pelvic organs in place.  Surgery. This is often the only form of treatment for severe prolapse. Different types of surgeries are available.  Follow these instructions at home:  Wear a sanitary pad or absorbent product if you have urinary incontinence.  Avoid heavy lifting and straining with exercise and work. Do not hold your breath when you perform mild to moderate lifting and exercise activities. Limit your activities as directed by  your health care provider.  Take medicines only as directed by your health care provider.  Perform Kegel exercises as directed by your  health care provider.  If you have a pessary, take care of it as directed by your health care provider. Contact a health care provider if:  Your symptoms interfere with your daily activities or sex life.  You need medicine to help with the discomfort.  You notice bleeding from the vagina that is not related to your period.  You have a fever.  You have pain or bleeding when you urinate.  You have bleeding when you have a bowel movement.  You lose urine when you have sex.  You have chronic constipation.  You have a pessary that falls out.  You have vaginal discharge that has a bad smell.  You have low abdominal pain or cramping that is unusual for you. This information is not intended to replace advice given to you by your health care provider. Make sure you discuss any questions you have with your health care provider. Document Released: 03/28/2014 Document Revised: 02/06/2016 Document Reviewed: 11/13/2013 Elsevier Interactive Patient Education  [employer].

## 2018-01-28 NOTE — Assessment & Plan Note (Signed)
Well controlled, no changes to meds. Encouraged heart healthy diet such as the DASH diet and exercise as tolerated.  °

## 2018-01-28 NOTE — Assessment & Plan Note (Signed)
hgba1c acceptable, minimize simple carbs. Increase exercise as tolerated.  

## 2018-01-28 NOTE — Assessment & Plan Note (Signed)
Check level 

## 2018-01-28 NOTE — Progress Notes (Signed)
Subjective:  I acted as a Education administrator for Dr. [name]. [name], [place]  Patient ID: Catherine [facility], female    DOB: December 29, 1931, 84 y.o.   MRN: [id number]  Chief Complaint  Patient presents with  . Hypertension    3 month follow up  . Hyperlipidemia  . Hypothyroidism    HPI  Patient is in today for 3 month follow up. She is following up on her HTN, TSH, hyperlipidemia and various medical concerns. She is feeling better. Her neck pain is improving. Her urinary incontinence has improved greatly since she started pelvic floor physical therapy. No recent hospitalization or febrile illness. Denies CP/palp/SOB/HA/congestion/fevers/GI or GU c/o. Taking meds as prescribed  Patient Care Team: [name], MD as PCP - General (Family Medicine)   Past Medical History:  Diagnosis Date  . Arthritis   . Atrophic rhinitis 04/21/2016  . Diverticulitis   . GERD (gastroesophageal reflux disease)   . Glaucoma   . Hiatal hernia with gastroesophageal reflux   . History of blood transfusion    for Knee replacement  . Hyperlipidemia, mild 04/02/2016  . Hypertension   . Hypothyroidism   . Migraine aura without headache   . Obesity 04/02/2016  . Parotiditis 04/21/2016  . Vitamin D deficiency 04/02/2016    Past Surgical History:  Procedure Laterality Date  . APPENDECTOMY    . cataract surgery  2008  . CHOLECYSTECTOMY  2010  . JOINT REPLACEMENT Left    2012  . POSTERIOR CERVICAL FUSION/FORAMINOTOMY N/A 04/27/2017   Procedure: CERVICAL FIVE-SIX  OPEN REDUCTION OF FRACTURE, CERVICAL THREE-SEVEN  DORSAL FIXATION AND FUSION;  Surgeon: Ditty, Kevan Ny, MD;  Location: [facility];  Service: Neurosurgery;  Laterality: N/A;  . SHOULDER ARTHROSCOPY Bilateral prior to 2010  . TONSILLECTOMY    . TOTAL HIP ARTHROPLASTY Right 03/22/2015   Procedure: RIGHT TOTAL HIP ARTHROPLASTY ANTERIOR APPROACH;  Surgeon: Dorna Leitz, MD;  Location: [facility];  Service: Orthopedics;  Laterality: Right;  . TOTAL KNEE ARTHROPLASTY Bilateral  2007  . TUBAL LIGATION      Family History  Problem Relation Age of Onset  . Dementia Mother   . Hypertension Mother   . Heart disease Father   . Hypertension Father   . Stroke Father   . Diabetes Father   . Arthritis Daughter   . Cancer Maternal Grandfather        colon cancer    Social History   Socioeconomic History  . Marital status: Widowed    Spouse name: Not on file  . Number of children: Not on file  . Years of education: Not on file  . Highest education level: Not on file  Occupational History  . Occupation: Retired  Scientific laboratory technician  . Financial resource strain: Not on file  . Food insecurity:    Worry: Not on file    Inability: Not on file  . Transportation needs:    Medical: Not on file    Non-medical: Not on file  Tobacco Use  . Smoking status: Former Research scientist (life sciences)  . Smokeless tobacco: Never Used  . Tobacco comment: Stopped 50 years ago.   Substance and Sexual Activity  . Alcohol use: Yes    Alcohol/week: 0.0 oz    Comment: socially  . Drug use: No  . Sexual activity: Never  Lifestyle  . Physical activity:    Days per week: Not on file    Minutes per session: Not on file  . Stress: Not on file  Relationships  .  Social connections:    Talks on phone: Not on file    Gets together: Not on file    Attends religious service: Not on file    Active member of club or organization: Not on file    Attends meetings of clubs or organizations: Not on file    Relationship status: Not on file  . Intimate partner violence:    Fear of current or ex partner: Not on file    Emotionally abused: Not on file    Physically abused: Not on file    Forced sexual activity: Not on file  Other Topics Concern  . Not on file  Social History Narrative   Lives at [facility], widowed 7 years, no dietary restrictions. Retired from neuropsychiatry work.     Outpatient Medications Prior to Visit  Medication Sig Dispense Refill  . brimonidine (ALPHAGAN) 0.2 % ophthalmic  solution Place 1 drop into both eyes 2 (two) times daily. 5 mL 12  . Cholecalciferol (VITAMIN D) 2000 units CAPS Take 1 capsule (2,000 Units total) by mouth daily. 30 capsule 5  . levothyroxine (SYNTHROID, LEVOTHROID) 100 MCG tablet TAKE ONE (1) TABLET BY MOUTH EVERY DAY BEFORE BREAKFAST 90 tablet 0  . losartan (COZAAR) 50 MG tablet Take 1 tablet (50 mg total) by mouth daily. 90 tablet 1  . Multiple Vitamins-Minerals (MULTIVITAMIN WITH MINERALS) tablet Take 1 tablet by mouth daily.    Marland Kitchen omeprazole (PRILOSEC) 20 MG capsule Take 1 capsule (20 mg total) by mouth daily. 90 capsule 1  . polyethylene glycol (MIRALAX / GLYCOLAX) packet Take 17 g by mouth daily. 14 each 0  . ranitidine (ZANTAC) 150 MG tablet Take 1 tablet (150 mg total) by mouth 2 (two) times daily as needed for heartburn. 60 tablet 2  . fluticasone (FLONASE) 50 MCG/ACT nasal spray Place 2 sprays into both nostrils daily. (Patient not taking: Reported on 01/28/2018) 16 g 6  . levocetirizine (XYZAL) 5 MG tablet Take 1 tablet (5 mg total) by mouth every evening. (Patient not taking: Reported on 01/28/2018) 30 tablet 5   No facility-administered medications prior to visit.     Allergies  Allergen Reactions  . Other Other (See Comments)    Seasonal allergies(pollen & grass)    Review of Systems  Constitutional: Negative for fever and malaise/fatigue.  HENT: Negative for congestion.   Eyes: Negative for blurred vision.  Respiratory: Negative for shortness of breath.   Cardiovascular: Negative for chest pain, palpitations and leg swelling.  Gastrointestinal: Negative for abdominal pain, blood in stool and nausea.  Genitourinary: Negative for dysuria and frequency.  Musculoskeletal: Positive for neck pain. Negative for falls.  Skin: Negative for rash.  Neurological: Negative for dizziness, loss of consciousness and headaches.  Endo/Heme/Allergies: Negative for environmental allergies.  Psychiatric/Behavioral: Negative for depression.  The patient is not nervous/anxious.        Objective:    Physical Exam  Constitutional: She is oriented to person, place, and time. No distress.  HENT:  Head: Normocephalic and atraumatic.  Eyes: Conjunctivae are normal.  Neck: Neck supple. No thyromegaly present.  Cardiovascular: Normal rate, regular rhythm and normal heart sounds.  No murmur heard. Pulmonary/Chest: Effort normal and breath sounds normal. She has no wheezes.  Abdominal: She exhibits no distension and no mass.  Musculoskeletal: She exhibits no edema.  Lymphadenopathy:    She has no cervical adenopathy.  Neurological: She is alert and oriented to person, place, and time.  Skin: Skin is warm and dry. No  rash noted. She is not diaphoretic.  Psychiatric: Judgment normal.    BP 128/86 (BP Location: Right Arm, Patient Position: Sitting, Cuff Size: Large)   Pulse 86   Temp 98.6 F (37 C) (Oral)   Resp 16   Ht 5\' 4"  (1.626 m)   Wt 207 lb 12.8 oz (94.3 kg)   SpO2 94%   BMI 35.67 kg/m  Wt Readings from Last 3 Encounters:  01/28/18 207 lb 12.8 oz (94.3 kg)  11/04/17 191 lb 6.4 oz (86.8 kg)  10/18/17 208 lb 12.8 oz (94.7 kg)   BP Readings from Last 3 Encounters:  01/28/18 128/86  11/04/17 108/78  10/18/17 130/80     Immunization History  Administered Date(s) Administered  . Influenza, High Dose Seasonal PF 06/14/2017  . Influenza-Unspecified 06/12/2014, 06/24/2015  . Pneumococcal Conjugate-13 10/17/2013, 04/30/2015  . Pneumococcal Polysaccharide-23 09/14/1998, 09/14/2008, 01/28/2018  . Tetanus 10/17/2013  . Zoster Recombinat (Shingrix) 01/26/2017    Health Maintenance  Topic Date Due  . INFLUENZA VACCINE  04/14/2018  . TETANUS/TDAP  10/18/2023  . DEXA SCAN  Completed  . PNA vac Low Risk Adult  Completed    Lab Results  Component Value Date   WBC 5.4 01/28/2018   HGB 14.5 01/28/2018   HCT 43.0 01/28/2018   PLT 217.0 01/28/2018   GLUCOSE 113 (H) 01/28/2018   CHOL 230 (H) 01/28/2018   TRIG 212.0  (H) 01/28/2018   HDL 59.90 01/28/2018   LDLDIRECT 144.0 01/28/2018   LDLCALC 102 (H) 10/18/2017   ALT 14 01/28/2018   AST 17 01/28/2018   NA 136 01/28/2018   K 4.4 01/28/2018   CL 100 01/28/2018   CREATININE 0.67 01/28/2018   BUN 13 01/28/2018   CO2 27 01/28/2018   TSH 3.62 01/28/2018   INR 1.05 04/28/2017   HGBA1C 6.1 01/28/2018    Lab Results  Component Value Date   TSH 3.62 01/28/2018   Lab Results  Component Value Date   WBC 5.4 01/28/2018   HGB 14.5 01/28/2018   HCT 43.0 01/28/2018   MCV 95.6 01/28/2018   PLT 217.0 01/28/2018   Lab Results  Component Value Date   NA 136 01/28/2018   K 4.4 01/28/2018   CO2 27 01/28/2018   GLUCOSE 113 (H) 01/28/2018   BUN 13 01/28/2018   CREATININE 0.67 01/28/2018   BILITOT 0.4 01/28/2018   ALKPHOS 86 01/28/2018   AST 17 01/28/2018   ALT 14 01/28/2018   PROT 7.2 01/28/2018   ALBUMIN 4.5 01/28/2018   CALCIUM 9.6 01/28/2018   ANIONGAP 8 05/15/2017   GFR 89.13 01/28/2018   Lab Results  Component Value Date   CHOL 230 (H) 01/28/2018   Lab Results  Component Value Date   HDL 59.90 01/28/2018   Lab Results  Component Value Date   LDLCALC 102 (H) 10/18/2017   Lab Results  Component Value Date   TRIG 212.0 (H) 01/28/2018   Lab Results  Component Value Date   CHOLHDL 4 01/28/2018   Lab Results  Component Value Date   HGBA1C 6.1 01/28/2018         Assessment & Plan:   Problem List Items Addressed This Visit    Hypertension    Well controlled, no changes to meds. Encouraged heart healthy diet such as the DASH diet and exercise as tolerated.       Relevant Orders   Lipid panel (Completed)   TSH (Completed)   CBC (Completed)   Comprehensive metabolic panel (Completed)  Hypothyroidism    On Levothyroxine, continue to monitor      Vitamin D deficiency    Check level       Relevant Orders   Vitamin D 1,25 dihydroxy   History of cervical fracture    Doing well without significant symptoms at this  time      Urinary incontinence    Good improvement with pelvic floor PT      Hyperglycemia    hgba1c acceptable, minimize simple carbs. Increase exercise as tolerated.       Relevant Orders   Hemoglobin A1c (Completed)   Lipid panel (Completed)    Other Visit Diagnoses    Pelvic floor dysfunction in female    -  Primary   Relevant Orders   Ambulatory referral to Gynecology   Measles screening       Relevant Orders   Measles/Mumps/Rubella Immunity   Epigastric pain       Relevant Orders   H. pylori antibody, IgG (Completed)   Morbid obesity ([place])       Relevant Orders   Amb Ref to Medical Weight Management      I have discontinued Shaletta Obriant's ranitidine. I am also having her start on ranitidine and cefdinir. Additionally, I am having her maintain her multivitamin with minerals, brimonidine, Vitamin D, polyethylene glycol, omeprazole, losartan, fluticasone, levocetirizine, and levothyroxine.  Meds ordered this encounter  Medications  . ranitidine (ZANTAC) 300 MG tablet    Sig: Take 1 tablet (300 mg total) by mouth at bedtime.    Dispense:  30 tablet    Refill:  5  . cefdinir (OMNICEF) 300 MG capsule    Sig: Take 1 capsule (300 mg total) by mouth 2 (two) times daily for 10 days.    Dispense:  20 capsule    Refill:  0    CMA served as scribe during this visit. History, Physical and Plan performed by medical provider. Documentation and orders reviewed and attested to.  Penni Homans, MD

## 2018-01-30 NOTE — Assessment & Plan Note (Signed)
Doing well without significant symptoms at this time

## 2018-01-30 NOTE — Assessment & Plan Note (Signed)
Good improvement with pelvic floor PT

## 2018-01-31 LAB — MEASLES/MUMPS/RUBELLA IMMUNITY: RUBELLA: 24.3 {index}

## 2018-01-31 LAB — VITAMIN D 1,25 DIHYDROXY
VITAMIN D 1, 25 (OH) TOTAL: 46 pg/mL (ref 18–72)
VITAMIN D3 1, 25 (OH): 46 pg/mL
Vitamin D2 1, 25 (OH)2: <8 pg/mL

## 2018-02-01 ENCOUNTER — Encounter: Payer: Self-pay | Admitting: Family Medicine

## 2018-02-04 NOTE — Telephone Encounter (Signed)
Please schedule for Nurse visit for HEP A vaccine

## 2018-02-04 NOTE — Telephone Encounter (Signed)
Called pt left a message to call us back to schedule Nurse visit for vaccine.

## 2018-02-15 NOTE — Telephone Encounter (Signed)
Copied from [place] [id number]. Topic: Quick Communication - Office Called Patient >> Feb 15, 2018  1:02 PM Rosalin Hawking wrote: Reason for CRM: LVM to inform pt appt was changed to 8:40 and combined with NV for the sameday on 02-17-2018 with Mackie Pai.

## 2018-02-17 ENCOUNTER — Ambulatory Visit (INDEPENDENT_AMBULATORY_CARE_PROVIDER_SITE_OTHER): Payer: Medicare Other | Admitting: Medical

## 2018-02-17 ENCOUNTER — Ambulatory Visit: Payer: Medicare Other | Admitting: Medical

## 2018-02-17 ENCOUNTER — Encounter: Payer: Self-pay | Admitting: Medical

## 2018-02-17 ENCOUNTER — Ambulatory Visit: Payer: Medicare Other

## 2018-02-17 VITALS — BP 120/70 | HR 66 | Temp 97.8°F | Resp 16 | Ht 64.0 in | Wt 212.0 lb

## 2018-02-17 DIAGNOSIS — R32 Unspecified urinary incontinence: Secondary | ICD-10-CM | POA: Diagnosis not present

## 2018-02-17 DIAGNOSIS — R42 Dizziness and giddiness: Secondary | ICD-10-CM | POA: Diagnosis not present

## 2018-02-17 DIAGNOSIS — E669 Obesity, unspecified: Secondary | ICD-10-CM | POA: Diagnosis not present

## 2018-02-17 DIAGNOSIS — M6289 Other specified disorders of muscle: Secondary | ICD-10-CM

## 2018-02-17 DIAGNOSIS — Z23 Encounter for immunization: Secondary | ICD-10-CM

## 2018-02-17 MED ORDER — OXYBUTYNIN CHLORIDE ER 5 MG PO TB24: 5.0000 mg | ORAL_TABLET | Freq: Every day | ORAL | 0 refills | Status: DC

## 2018-02-17 NOTE — Patient Instructions (Signed)
For your history of pelvic floor dysfunction and urinary incontinence, I am making Ditropan available.  You have done well with your exercises and has noted improvement.  But making the Ditropan available in light of the fact that your heading overseas and may find this helpful.  However presently by your description I do not think you need to take it every night.  We discussed referral to gynecologist.  You declined that today.  But if you do change your mind then please let me know.  He has some transient lightheadedness today.  Of the time he left the office that resolved.  He also had a negative neurologic exam.  Blood work declined today.  If you have recurrent dizziness please let us know.  We did give you the first hepatitis A vaccine today.  Regarding your referral for a weight loss, I am going to talk with our referral coordinator Gwenn and asked that she open up the referral to other locations since you were not given appointment with a prior office we referred you to.  Follow-up as regular scheduled with PCP or as needed.

## 2018-02-17 NOTE — Progress Notes (Signed)
Subjective:    Patient ID: Catherine Coleman, female    DOB: April 16, 1932, 84 y.o.   MRN: [id number]  HPI  Pt in for follow up.  Pt states she had some pelvic floor dysfunction. Pt was having some urinary incontinence associated with this. She has been doing pelvic floor exercises and she has better control. But she is about to go [place] middle of July. She does not want to have any urinary incontinence over there. She also states she will have to use outhouses occasionally.  Pt has 4 children. All natural births.  Also, wants medication to help her to sleep.(Pt mentioned this to Dr. [name] on last visit but did not get any script.)  No uti type signs or symptoms.  Pt had dental procedure yesterday preparing for implants. Procedure was painful. Little light headed at that time.  Was referred to weight loss center. They did not give her appointment. Only put on list? Pt wants to try other location or other organization.   Review of Systems  Constitutional: Negative for chills, fatigue and fever.  Respiratory: Negative for cough, chest tightness, shortness of breath and wheezing.   Cardiovascular: Negative for chest pain and palpitations.  Gastrointestinal: Negative for abdominal pain.  Genitourinary: Negative for decreased urine volume, difficulty urinating, dysuria, hematuria, vaginal bleeding and vaginal discharge.       Hx of incontinence.  Musculoskeletal: Negative for back pain, myalgias and neck pain.  Skin: Negative for rash.  Neurological: Negative for dizziness, speech difficulty, weakness, numbness and headaches.       Faint light headed now. Since this morning. Upper jaw mild throb since last night.  She states better by end of the exam. She declined any labs.  Hematological: Negative for adenopathy. Does not bruise/bleed easily.  Psychiatric/Behavioral: Negative for behavioral problems, confusion and suicidal ideas. The patient is not nervous/anxious.     Past Medical  History:  Diagnosis Date  . Arthritis   . Atrophic rhinitis 04/21/2016  . Diverticulitis   . GERD (gastroesophageal reflux disease)   . Glaucoma   . Hiatal hernia with gastroesophageal reflux   . History of blood transfusion    for Knee replacement  . Hyperlipidemia, mild 04/02/2016  . Hypertension   . Hypothyroidism   . Migraine aura without headache   . Obesity 04/02/2016  . Parotiditis 04/21/2016  . Vitamin D deficiency 04/02/2016     Social History   Socioeconomic History  . Marital status: Widowed    Spouse name: Not on file  . Number of children: Not on file  . Years of education: Not on file  . Highest education level: Not on file  Occupational History  . Occupation: Retired  Scientific laboratory technician  . Financial resource strain: Not on file  . Food insecurity:    Worry: Not on file    Inability: Not on file  . Transportation needs:    Medical: Not on file    Non-medical: Not on file  Tobacco Use  . Smoking status: Former Research scientist (life sciences)  . Smokeless tobacco: Never Used  . Tobacco comment: Stopped 50 years ago.   Substance and Sexual Activity  . Alcohol use: Yes    Alcohol/week: 0.0 oz    Comment: socially  . Drug use: No  . Sexual activity: Never  Lifestyle  . Physical activity:    Days per week: Not on file    Minutes per session: Not on file  . Stress: Not on file  Relationships  . Social  connections:    Talks on phone: Not on file    Gets together: Not on file    Attends religious service: Not on file    Active member of club or organization: Not on file    Attends meetings of clubs or organizations: Not on file    Relationship status: Not on file  . Intimate partner violence:    Fear of current or ex partner: Not on file    Emotionally abused: Not on file    Physically abused: Not on file    Forced sexual activity: Not on file  Other Topics Concern  . Not on file  Social History Narrative   Lives at [facility], widowed 7 years, no dietary restrictions. Retired  from neuropsychiatry work.     Past Surgical History:  Procedure Laterality Date  . APPENDECTOMY    . cataract surgery  2008  . CHOLECYSTECTOMY  2010  . JOINT REPLACEMENT Left    2012  . POSTERIOR CERVICAL FUSION/FORAMINOTOMY N/A 04/27/2017   Procedure: CERVICAL FIVE-SIX  OPEN REDUCTION OF FRACTURE, CERVICAL THREE-SEVEN  DORSAL FIXATION AND FUSION;  Surgeon: Ditty, Kevan Ny, MD;  Location: [facility];  Service: Neurosurgery;  Laterality: N/A;  . SHOULDER ARTHROSCOPY Bilateral prior to 2010  . TONSILLECTOMY    . TOTAL HIP ARTHROPLASTY Right 03/22/2015   Procedure: RIGHT TOTAL HIP ARTHROPLASTY ANTERIOR APPROACH;  Surgeon: Dorna Leitz, MD;  Location: [facility];  Service: Orthopedics;  Laterality: Right;  . TOTAL KNEE ARTHROPLASTY Bilateral 2007  . TUBAL LIGATION      Family History  Problem Relation Age of Onset  . Dementia Mother   . Hypertension Mother   . Heart disease Father   . Hypertension Father   . Stroke Father   . Diabetes Father   . Arthritis Daughter   . Cancer Maternal Grandfather        colon cancer    Allergies  Allergen Reactions  . Other Other (See Comments)    Seasonal allergies(pollen & grass)    Current Outpatient Medications on File Prior to Visit  Medication Sig Dispense Refill  . brimonidine (ALPHAGAN) 0.2 % ophthalmic solution Place 1 drop into both eyes 2 (two) times daily. 5 mL 12  . Cholecalciferol (VITAMIN D) 2000 units CAPS Take 1 capsule (2,000 Units total) by mouth daily. 30 capsule 5  . fluticasone (FLONASE) 50 MCG/ACT nasal spray Place 2 sprays into both nostrils daily. (Patient not taking: Reported on 01/28/2018) 16 g 6  . levocetirizine (XYZAL) 5 MG tablet Take 1 tablet (5 mg total) by mouth every evening. (Patient not taking: Reported on 01/28/2018) 30 tablet 5  . levothyroxine (SYNTHROID, LEVOTHROID) 100 MCG tablet TAKE ONE (1) TABLET BY MOUTH EVERY DAY BEFORE BREAKFAST 90 tablet 0  . losartan (COZAAR) 50 MG tablet Take 1 tablet (50 mg total) by  mouth daily. 90 tablet 1  . Multiple Vitamins-Minerals (MULTIVITAMIN WITH MINERALS) tablet Take 1 tablet by mouth daily.    Marland Kitchen omeprazole (PRILOSEC) 20 MG capsule Take 1 capsule (20 mg total) by mouth daily. 90 capsule 1  . polyethylene glycol (MIRALAX / GLYCOLAX) packet Take 17 g by mouth daily. 14 each 0  . ranitidine (ZANTAC) 300 MG tablet Take 1 tablet (300 mg total) by mouth at bedtime. 30 tablet 5   No current facility-administered medications on file prior to visit.     BP 111/63   Pulse 66   Temp 97.8 F (36.6 C) (Oral)   Resp 16  Ht 5\' 4"  (1.626 m)   Wt 212 lb (96.2 kg)   SpO2 96%   BMI 36.39 kg/m       Objective:   Physical Exam  General Mental Status- Alert. General Appearance- Not in acute distress.   Skin General: Color- Normal Color. Moisture- Normal Moisture.  Neck Carotid Arteries- Normal color. Moisture- Normal Moisture. No carotid bruits. No JVD.  Chest and Lung Exam Auscultation: Breath Sounds:-Normal.  Cardiovascular Auscultation:Rythm- Regular. Murmurs & Other Heart Sounds:Auscultation of the heart reveals- No Murmurs.  Abdomen Inspection:-Inspeection Normal. Palpation/Percussion:Note:No mass. Palpation and Percussion of the abdomen reveal- Non Tender, Non Distended + BS, no rebound or guarding.  Neurologic Cranial Nerve exam:- CN III-XII intact(No nystagmus), symmetric smile. Strength:- 5/5 equal and symmetric strength both upper and lower extremities.      Assessment & Plan:  For your history of pelvic floor dysfunction and urinary incontinence, I am making Ditropan available.  You have done well with your exercises and has noted improvement.  But making the Ditropan available in light of the fact that your heading overseas and may find this helpful.  However presently by your description I do not think you need to take it every night.  We discussed referral to gynecologist.  You declined that today.  But if you do change your mind then  please let me know.  He has some transient lightheadedness today.  Of the time he left the office that resolved.  He also had a negative neurologic exam.  Blood work declined today.  If you have recurrent dizziness please let us know.  We did give you the first hepatitis A vaccine today.  Regarding your referral for a weight loss, I am going to talk with our referral coordinator Gwenn and asked that she open up the referral to other locations since you were not given appointment with a prior office we referred you to.  Follow-up as regular scheduled with PCP or as needed.  Mackie Pai, PA-C

## 2018-03-09 ENCOUNTER — Encounter: Payer: Self-pay | Admitting: Family Medicine

## 2018-03-09 ENCOUNTER — Ambulatory Visit (INDEPENDENT_AMBULATORY_CARE_PROVIDER_SITE_OTHER): Payer: Medicare Other | Admitting: Family Medicine

## 2018-03-09 DIAGNOSIS — N3941 Urge incontinence: Secondary | ICD-10-CM

## 2018-03-09 NOTE — Assessment & Plan Note (Signed)
Predominantly urge--unfortunately there are no easy fixes for this. I recommend continued bladder training, limitation of liquids after 6 pm. She has excellent vaginal support and does not require any surgical repairs. May use incontinence pads if needed. Also, use Ditropan for limited time while on trip. Would not recommend this long term, due to age, side effect profile, links to dementia.
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
## 2018-03-09 NOTE — Patient Instructions (Signed)
Urinary Incontinence Urinary incontinence is the involuntary loss of urine from your bladder. What are the causes? There are many causes of urinary incontinence. They include:  Medicines.  Infections.  Prostatic enlargement, leading to overflow of urine from your bladder.  Surgery.  Neurological diseases.  Emotional factors.  What are the signs or symptoms? Urinary Incontinence can be divided into four types: 1. Urge incontinence. Urge incontinence is the involuntary loss of urine before you have the opportunity to go to the bathroom. There is a sudden urge to void but not enough time to reach a bathroom. 2. Stress incontinence. Stress incontinence is the sudden loss of urine with any activity that forces urine to pass. It is commonly caused by anatomical changes to the pelvis and sphincter areas of your body. 3. Overflow incontinence. Overflow incontinence is the loss of urine from an obstructed opening to your bladder. This results in a backup of urine and a resultant buildup of pressure within the bladder. When the pressure within the bladder exceeds the closing pressure of the sphincter, the urine overflows, which causes incontinence, similar to water overflowing a dam. 4. Total incontinence. Total incontinence is the loss of urine as a result of the inability to store urine within your bladder.  How is this diagnosed? Evaluating the cause of incontinence may require:  A thorough and complete medical and obstetric history.  A complete physical exam.  Laboratory tests such as a urine culture and sensitivities.  When additional tests are indicated, they can include:  An ultrasound exam.  Kidney and bladder X-rays.  Cystoscopy. This is an exam of the bladder using a narrow scope.  Urodynamic testing to test the nerve function to the bladder and sphincter areas.  How is this treated? Treatment for urinary incontinence depends on the cause:  For urge incontinence caused  by a bacterial infection, antibiotics will be prescribed. If the urge incontinence is related to medicines you take, your health care provider may have you change the medicine.  For stress incontinence, surgery to re-establish anatomical support to the bladder or sphincter, or both, will often correct the condition.  For overflow incontinence caused by an enlarged prostate, an operation to open the channel through the enlarged prostate will allow the flow of urine out of the bladder. In women with fibroids, a hysterectomy may be recommended.  For total incontinence, surgery on your urinary sphincter may help. An artificial urinary sphincter (an inflatable cuff placed around the urethra) may be required. In women who have developed a hole-like passage between their bladder and vagina (vesicovaginal fistula), surgery to close the fistula often is required.  Follow these instructions at home:  Normal daily hygiene and the use of pads or adult diapers that are changed regularly will help prevent odors and skin damage.  Avoid caffeine. It can overstimulate your bladder.  Use the bathroom regularly. Try about every 2-3 hours to go to the bathroom, even if you do not feel the need to do so. Take time to empty your bladder completely. After urinating, wait a minute. Then try to urinate again.  For causes involving nerve dysfunction, keep a log of the medicines you take and a journal of the times you go to the bathroom. Contact a health care provider if:  You experience worsening of pain instead of improvement in pain after your procedure.  Your incontinence becomes worse instead of better. Get help right away if:  You experience fever or shaking chills.  You are unable to   pass your urine.  You have redness spreading into your groin or down into your thighs. This information is not intended to replace advice given to you by your health care provider. Make sure you discuss any questions you have  with your health care provider. Document Released: 10/08/2004 Document Revised: 04/10/2016 Document Reviewed: 02/07/2013 Elsevier Interactive Patient Education  2018 [employer]  

## 2018-04-13 IMAGING — CR DG CHEST 2V
2 series · 2 of 2 positions shown · non-contrast
Comparison: 04/27/2017

CLINICAL DATA: Back pain and left upper extremity pain, onset at
[DATE] today

EXAM:
CHEST  2 VIEW

[chest lat]
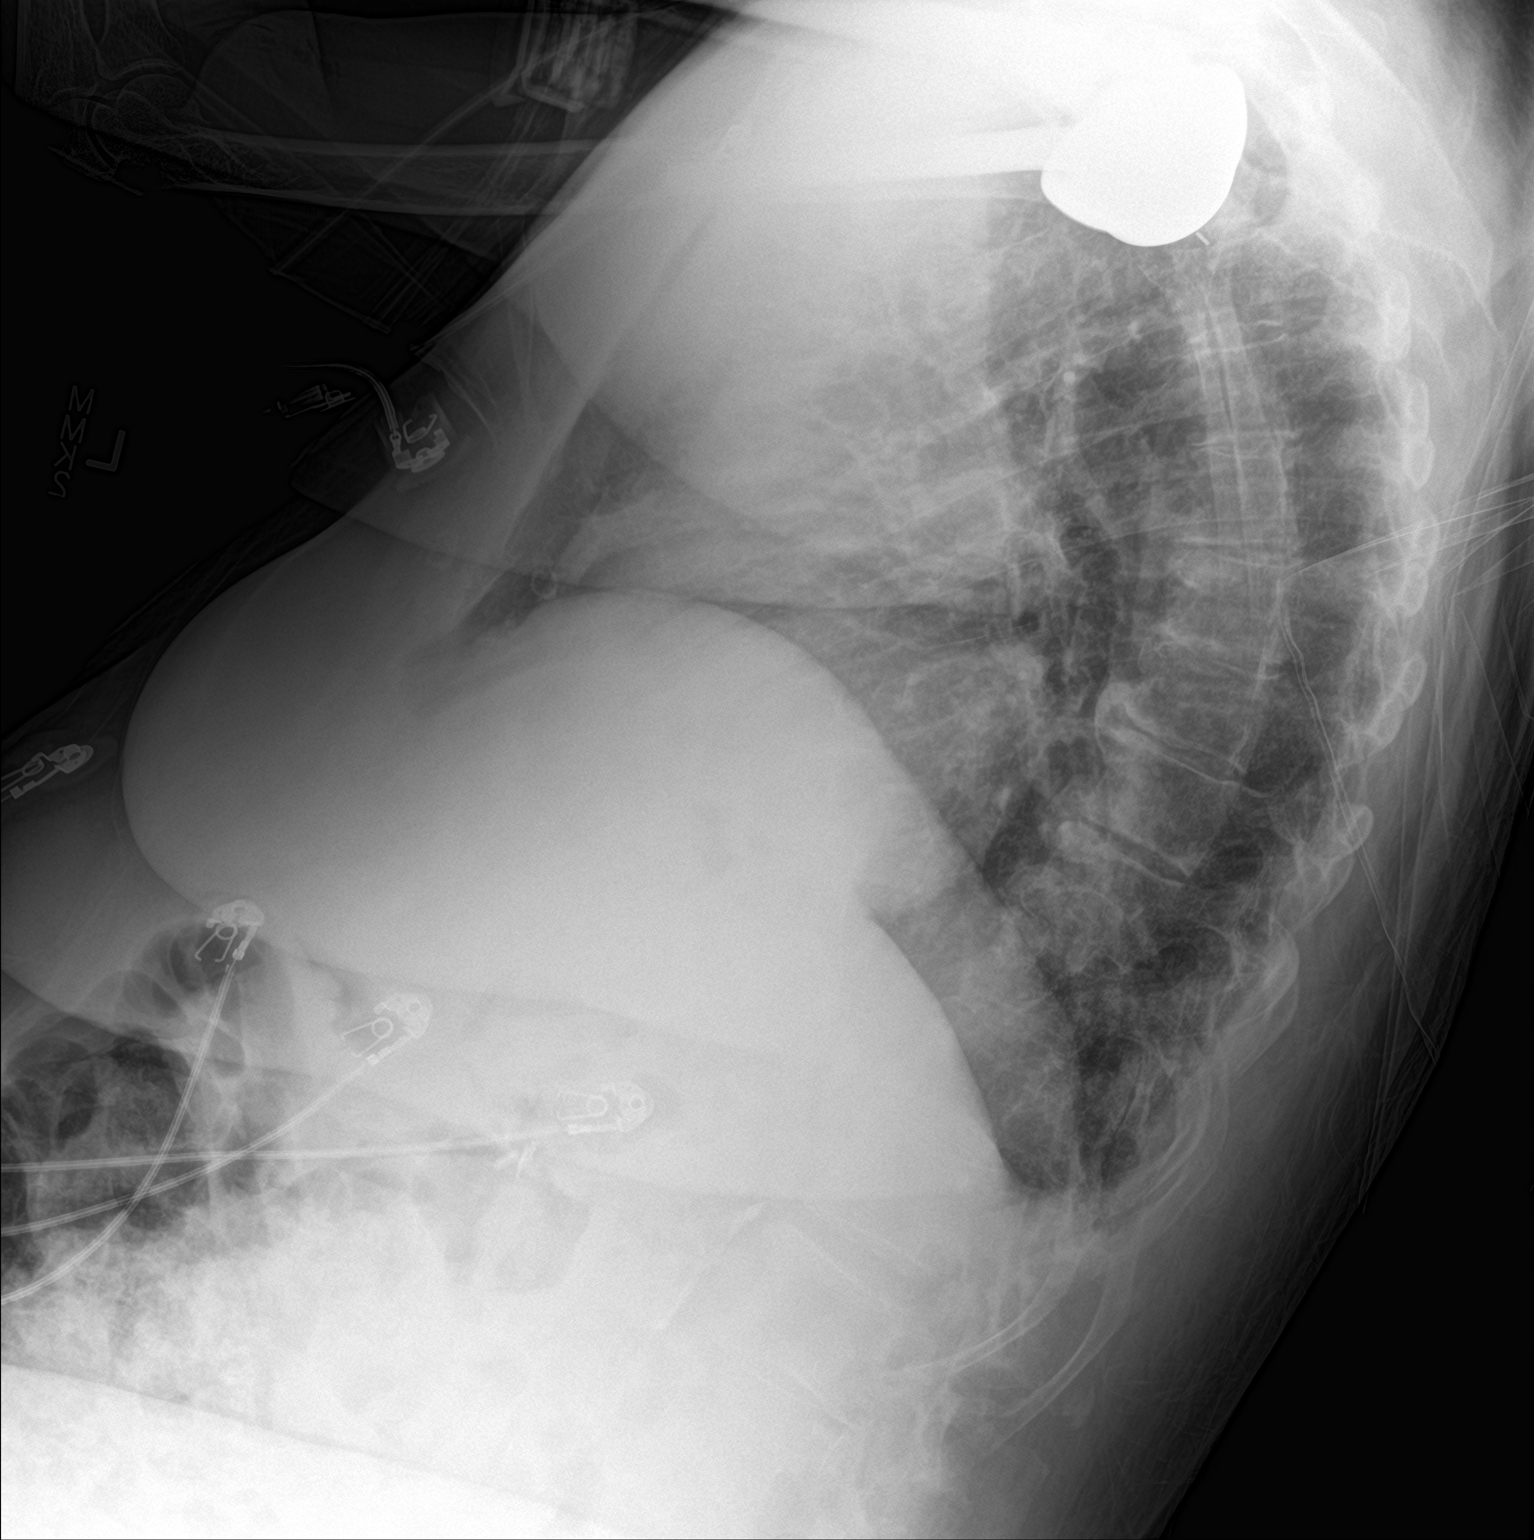

[chest ap]
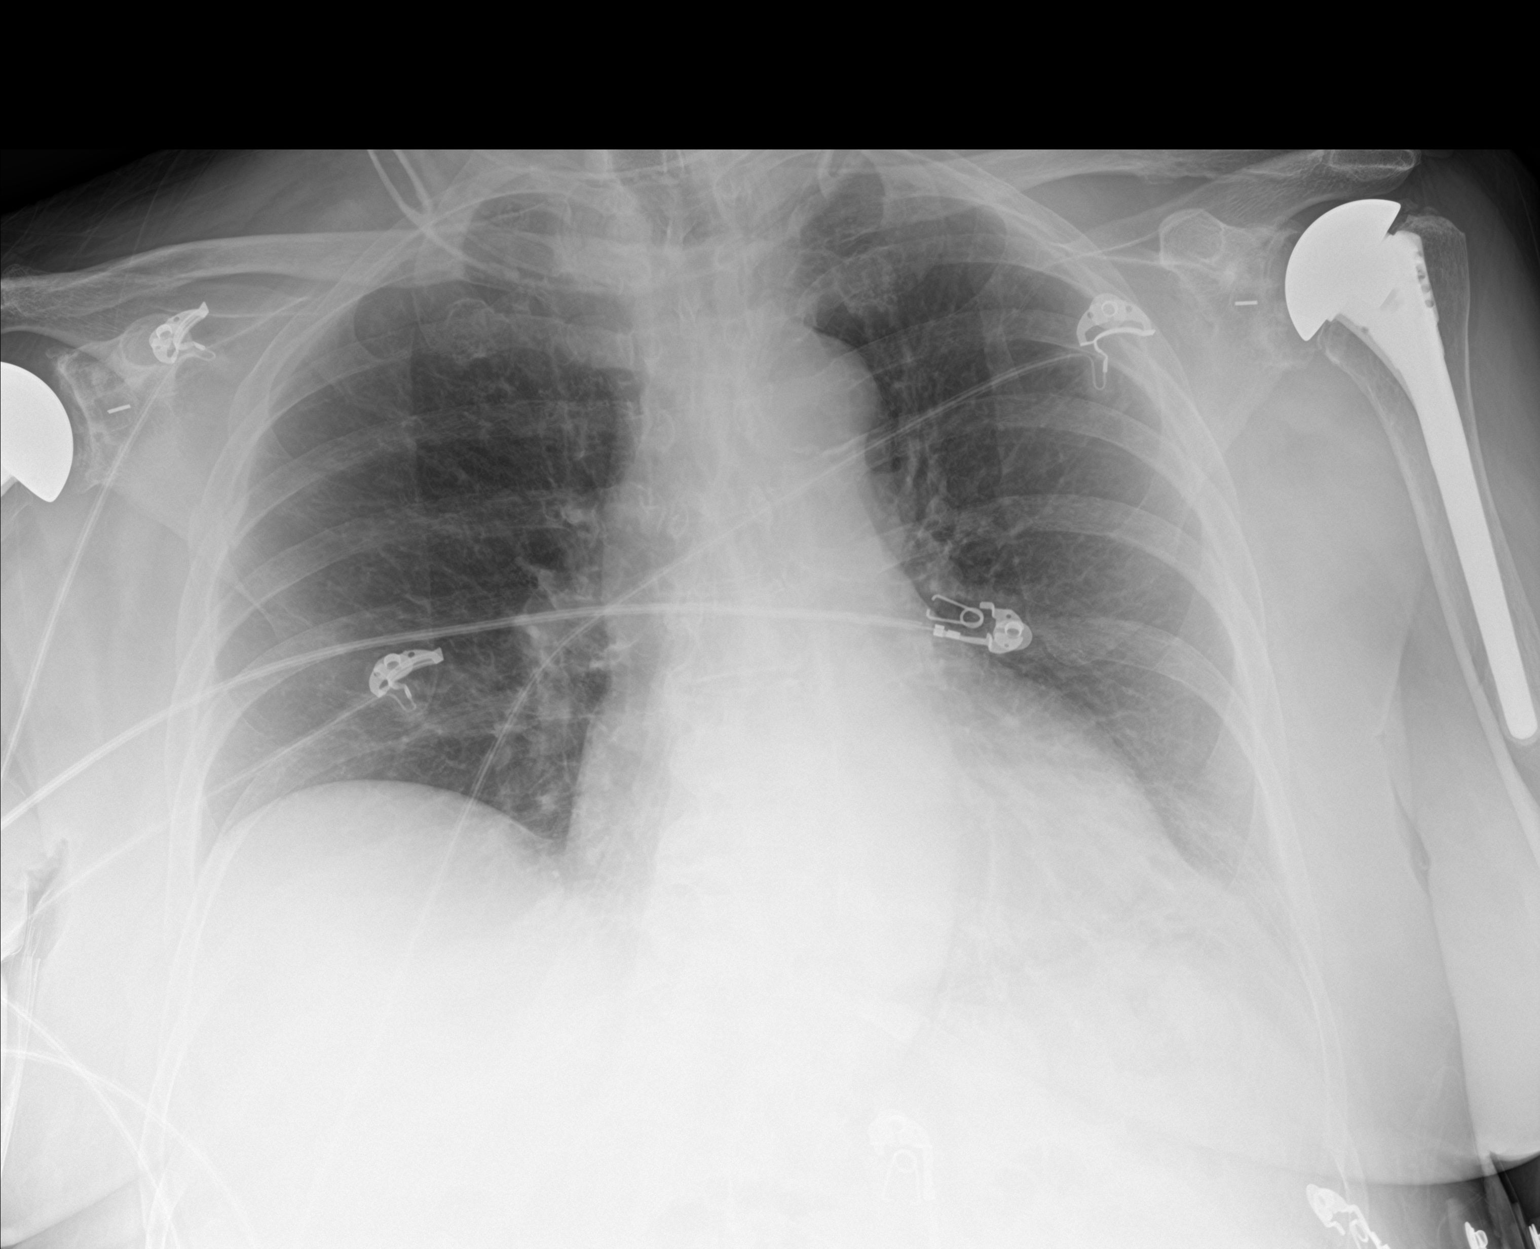

[2 of 2 positions shown; findings below may reference images not displayed]

FINDINGS: Stable mild right hemidiaphragm elevation. Patchy left base airspace
opacity. Right lung is clear. Pulmonary vasculature is normal.
IMPRESSION: New patchy opacity in the left base.  Cannot exclude pneumonia.

## 2018-04-26 ENCOUNTER — Ambulatory Visit: Payer: Medicare Other | Admitting: Family Medicine

## 2018-04-26 ENCOUNTER — Other Ambulatory Visit: Payer: Self-pay | Admitting: Family Medicine

## 2018-05-17 ENCOUNTER — Encounter (INDEPENDENT_AMBULATORY_CARE_PROVIDER_SITE_OTHER): Payer: Self-pay

## 2018-05-27 ENCOUNTER — Encounter: Payer: Self-pay | Admitting: Family

## 2018-05-27 ENCOUNTER — Ambulatory Visit (HOSPITAL_BASED_OUTPATIENT_CLINIC_OR_DEPARTMENT_OTHER)
Admission: RE | Admit: 2018-05-27 | Discharge: 2018-05-27 | Disposition: A | Discharge status: Home / Self Care | Payer: Medicare Other | Source: Ambulatory Visit | Attending: Family | Admitting: Family

## 2018-05-27 ENCOUNTER — Ambulatory Visit (INDEPENDENT_AMBULATORY_CARE_PROVIDER_SITE_OTHER): Payer: Medicare Other | Admitting: Family

## 2018-05-27 VITALS — BP 128/67 | HR 68 | Temp 98.0°F | Resp 18 | Ht 64.0 in | Wt 215.4 lb

## 2018-05-27 DIAGNOSIS — J302 Other seasonal allergic rhinitis: Secondary | ICD-10-CM

## 2018-05-27 DIAGNOSIS — Z23 Encounter for immunization: Secondary | ICD-10-CM

## 2018-05-27 DIAGNOSIS — M542 Cervicalgia: Secondary | ICD-10-CM

## 2018-05-27 DIAGNOSIS — Z981 Arthrodesis status: Secondary | ICD-10-CM | POA: Diagnosis not present

## 2018-05-27 MED ORDER — LEVOCETIRIZINE DIHYDROCHLORIDE 5 MG PO TABS: 5.0000 mg | ORAL_TABLET | Freq: Every evening | ORAL | 5 refills | Status: DC

## 2018-05-27 MED ORDER — FLUTICASONE PROPIONATE 50 MCG/ACT NA SUSP: 2.0000 | Freq: Every day | NASAL | 6 refills | Status: DC

## 2018-05-27 NOTE — Patient Instructions (Signed)
Please compete your neck xray on the first floor. Keep your upcoming appointment with neurosurgery. Go to the ER if you develop increased weakness/numbness or worsening urinary incontinence or incontinence of stool.

## 2018-05-27 NOTE — Progress Notes (Signed)
Subjective:    Patient ID: Catherine Coleman, female    DOB: 25-Dec-1931, 84 y.o.   MRN: [id number]  HPI  Patient is an 84 yr old female who presents today with chief complaint of neck stiffness. She reports that she has hx of spinal fusion which was completed last year (C3-7).  Describes pain as sharp pain in her neck which "did not last."  Now has "crackles and crunches when she turns her head."  Reports that Dr. Cyndy Freeze did her surgery.    Reports that she had some forearm pruritis on Sunday.  Denies weakness in her upper arms.  Reports some mild tingling in the right forearm which has now resolved.   Reports urinary incontinence which has improved with pelvic floor rehab.  Feels like "my legs feel funny, "like I am weak."      Review of Systems    see HPI  Past Medical History:  Diagnosis Date  . Arthritis   . Atrophic rhinitis 04/21/2016  . Diverticulitis   . GERD (gastroesophageal reflux disease)   . Glaucoma   . Hiatal hernia with gastroesophageal reflux   . History of blood transfusion    for Knee replacement  . Hyperlipidemia, mild 04/02/2016  . Hypertension   . Hypothyroidism   . Migraine aura without headache   . Obesity 04/02/2016  . Parotiditis 04/21/2016  . Vitamin D deficiency 04/02/2016     Social History   Socioeconomic History  . Marital status: Widowed    Spouse name: Not on file  . Number of children: Not on file  . Years of education: Not on file  . Highest education level: Not on file  Occupational History  . Occupation: Retired  Scientific laboratory technician  . Financial resource strain: Not on file  . Food insecurity:    Worry: Not on file    Inability: Not on file  . Transportation needs:    Medical: Not on file    Non-medical: Not on file  Tobacco Use  . Smoking status: Former Research scientist (life sciences)  . Smokeless tobacco: Never Used  . Tobacco comment: Stopped 50 years ago.   Substance and Sexual Activity  . Alcohol use: Yes    Alcohol/week: 0.0 standard drinks    Comment:  socially  . Drug use: No  . Sexual activity: Never  Lifestyle  . Physical activity:    Days per week: Not on file    Minutes per session: Not on file  . Stress: Not on file  Relationships  . Social connections:    Talks on phone: Not on file    Gets together: Not on file    Attends religious service: Not on file    Active member of club or organization: Not on file    Attends meetings of clubs or organizations: Not on file    Relationship status: Not on file  . Intimate partner violence:    Fear of current or ex partner: Not on file    Emotionally abused: Not on file    Physically abused: Not on file    Forced sexual activity: Not on file  Other Topics Concern  . Not on file  Social History Narrative   Lives at [facility], widowed 7 years, no dietary restrictions. Retired from neuropsychiatry work.     Past Surgical History:  Procedure Laterality Date  . APPENDECTOMY    . cataract surgery  2008  . CHOLECYSTECTOMY  2010  . JOINT REPLACEMENT Left    2012  .  POSTERIOR CERVICAL FUSION/FORAMINOTOMY N/A 04/27/2017   Procedure: CERVICAL FIVE-SIX  OPEN REDUCTION OF FRACTURE, CERVICAL THREE-SEVEN  DORSAL FIXATION AND FUSION;  Surgeon: Ditty, Kevan Ny, MD;  Location: [facility];  Service: Neurosurgery;  Laterality: N/A;  . SHOULDER ARTHROSCOPY Bilateral prior to 2010  . TONSILLECTOMY    . TOTAL HIP ARTHROPLASTY Right 03/22/2015   Procedure: RIGHT TOTAL HIP ARTHROPLASTY ANTERIOR APPROACH;  Surgeon: Dorna Leitz, MD;  Location: [facility];  Service: Orthopedics;  Laterality: Right;  . TOTAL KNEE ARTHROPLASTY Bilateral 2007  . TUBAL LIGATION      Family History  Problem Relation Age of Onset  . Dementia Mother   . Hypertension Mother   . Heart disease Father   . Hypertension Father   . Stroke Father   . Diabetes Father   . Arthritis Daughter   . Cancer Maternal Grandfather        colon cancer    Allergies  Allergen Reactions  . Other Other (See Comments)    Seasonal  allergies(pollen & grass)    Current Outpatient Medications on File Prior to Visit  Medication Sig Dispense Refill  . brimonidine (ALPHAGAN) 0.2 % ophthalmic solution Place 1 drop into both eyes 2 (two) times daily. 5 mL 12  . Cholecalciferol (VITAMIN D) 2000 units CAPS Take 1 capsule (2,000 Units total) by mouth daily. 30 capsule 5  . levothyroxine (SYNTHROID, LEVOTHROID) 100 MCG tablet TAKE ONE (1) TABLET BY MOUTH EVERY DAY BEFORE BREAKFAST 90 tablet 0  . losartan (COZAAR) 50 MG tablet TAKE ONE (1) TABLET BY MOUTH EVERY DAY 90 tablet 1  . Multiple Vitamins-Minerals (MULTIVITAMIN WITH MINERALS) tablet Take 1 tablet by mouth daily.    Marland Kitchen omeprazole (PRILOSEC) 20 MG capsule Take 1 capsule (20 mg total) by mouth daily. 90 capsule 1  . polyethylene glycol (MIRALAX / GLYCOLAX) packet Take 17 g by mouth daily. 14 each 0  . ranitidine (ZANTAC) 300 MG tablet Take 1 tablet (300 mg total) by mouth at bedtime. 30 tablet 5   No current facility-administered medications on file prior to visit.     BP 128/67 (BP Location: Right Arm, Cuff Size: Large)   Pulse 68   Temp 98 F (36.7 C) (Oral)   Resp 18   Ht 5\' 4"  (1.626 m)   Wt 215 lb 6.4 oz (97.7 kg)   SpO2 99%   BMI 36.97 kg/m   c Objective:   Physical Exam  Constitutional: She is oriented to person, place, and time. She appears well-developed and well-nourished.  Cardiovascular: Normal rate, regular rhythm and normal heart sounds.  No murmur heard. Pulmonary/Chest: Effort normal and breath sounds normal. No respiratory distress. She has no wheezes.  Neurological: She is alert and oriented to person, place, and time. She displays normal reflexes. She exhibits normal muscle tone.  Psychiatric: She has a normal mood and affect. Her behavior is normal. Judgment and thought content normal.          Assessment & Plan:  Neck pain- X-ray is performed of c-spine and notes:  Posterior cervical fusion at C4-C7.  Prominent facet disease at  C3-C4. Bony encroachment of the right foramen at C3-C4.  Bony encroachment of the left foramen at C6-C7.  Pt is advised to keep her upcoming appointment with neurosurgery.  She is also advised to  Go to the ER if she develop increased weakness/numbness or worsening urinary incontinence or incontinence of stool.

## 2018-05-28 ENCOUNTER — Encounter: Payer: Self-pay | Admitting: Family

## 2018-05-30 ENCOUNTER — Encounter (INDEPENDENT_AMBULATORY_CARE_PROVIDER_SITE_OTHER): Payer: Self-pay | Admitting: Bariatrics

## 2018-05-30 ENCOUNTER — Ambulatory Visit (INDEPENDENT_AMBULATORY_CARE_PROVIDER_SITE_OTHER): Payer: Medicare Other | Admitting: Bariatrics

## 2018-05-30 VITALS — BP 149/78 | HR 62 | Temp 97.5°F | Ht 65.0 in | Wt 210.0 lb

## 2018-05-30 DIAGNOSIS — Z6835 Body mass index (BMI) 35.0-35.9, adult: Secondary | ICD-10-CM

## 2018-05-30 DIAGNOSIS — E559 Vitamin D deficiency, unspecified: Secondary | ICD-10-CM

## 2018-05-30 DIAGNOSIS — Z1331 Encounter for screening for depression: Secondary | ICD-10-CM

## 2018-05-30 DIAGNOSIS — I10 Essential (primary) hypertension: Secondary | ICD-10-CM | POA: Diagnosis not present

## 2018-05-30 DIAGNOSIS — R7309 Other abnormal glucose: Secondary | ICD-10-CM

## 2018-05-30 DIAGNOSIS — R5383 Other fatigue: Secondary | ICD-10-CM

## 2018-05-30 DIAGNOSIS — Z0289 Encounter for other administrative examinations: Secondary | ICD-10-CM

## 2018-05-30 DIAGNOSIS — R0602 Shortness of breath: Secondary | ICD-10-CM | POA: Diagnosis not present

## 2018-05-31 LAB — HEMOGLOBIN A1C
ESTIMATED AVERAGE GLUCOSE: 131 mg/dL
Hgb A1c MFr Bld: 6.2 % — ABNORMAL HIGH (ref 4.8–5.6)

## 2018-05-31 LAB — VITAMIN D 25 HYDROXY (VIT D DEFICIENCY, FRACTURES): VIT D 25 HYDROXY: 38.2 ng/mL (ref 30.0–100.0)

## 2018-05-31 LAB — INSULIN, RANDOM: INSULIN: 15.5 u[IU]/mL (ref 2.6–24.9)

## 2018-05-31 NOTE — Progress Notes (Signed)
Office: [phone]  /  Fax: [phone]   Dear Catherine Coleman,   Thank you for referring Catherine Coleman to our clinic. The following note includes my evaluation and treatment recommendations.  HPI:   Chief Complaint: OBESITY    Catherine Coleman has been referred by Catherine Levine A. Catherine Blake, MD for consultation regarding her obesity and obesity related comorbidities.    Catherine Coleman (MR# [id number]) is a 84 y.o. female who presents on 05/31/2018 for obesity evaluation and treatment. Current BMI is Body mass index is 34.95 kg/m.Catherine Coleman has been struggling with her weight for many years and has been unsuccessful in either losing weight, maintaining weight loss, or reaching her healthy weight goal. Catherine Coleman denies any stress eating.     Catherine Coleman attended our information session and states she is currently in the action stage of change and ready to dedicate time achieving and maintaining a healthier weight. Catherine Coleman is interested in becoming our patient and working on intensive lifestyle modifications including (but not limited to) diet, exercise and weight loss.    Catherine Coleman states her desired weight loss is 50 to 55 lbs she has been heavy most of  her life she started gaining weight after she went back to work her heaviest weight ever was 224 lbs. she has significant food cravings for carbohydrates  she skips breakfast on a regular basis she is frequently drinking liquids with calories she frequently eats larger portions than normal  she has binge eating behaviors she struggles with emotional eating  She states she is not picking out healthier foods   Fatigue [name] feels her energy is lower than it should be. This has worsened with weight gain and has not worsened recently. [name] admits to daytime somnolence and admits to waking up still tired. Patient is at risk for obstructive sleep apnea. Patent has a history of symptoms of daytime fatigue, morning fatigue and hypertension. Patient generally gets 6  to 8 hours of sleep per night, and states they generally have restless sleep. Snoring is present. Apneic episodes are not present. Epworth Sleepiness Score is 5  Dyspnea on exertion Catherine Coleman notes increasing shortness of breath with exercising and seems to be worsening over time with weight gain. She notes getting out of breath sooner with activity than she used to. This has not gotten worse recently. Her expected BMR is at 1540 and her RMR is at  1723. Catherine Coleman denies orthopnea.  Hypertension Catherine Coleman is a 84 y.o. female with hypertension. Catherine Coleman is taking losartan without any side effects and her blood pressure is essentially well controlled. Catherine Coleman denies chest pain. She is working weight loss to help control her blood pressure with the goal of decreasing her risk of heart attack and stroke.  Vitamin D deficiency Catherine Coleman has a diagnosis of vitamin D deficiency. She is currently taking OTC vit D without any side effects. Catherine Coleman denies nausea, vomiting or muscle weakness.  Elevated Glucose Catherine Coleman has a history of some elevated blood glucose readings. She denies polyuria or polydipsia.   Depression Screen Catherine Coleman's Food and Mood (modified PHQ-9) score was  Depression screen PHQ 2/9 05/30/2018  Decreased Interest 3  Down, Depressed, Hopeless 1  PHQ - 2 Score 4  Altered sleeping 0  Tired, decreased energy 1  Change in appetite 1  Feeling bad or failure about yourself  0  Trouble concentrating 0  Moving slowly or fidgety/restless 0  Suicidal thoughts 0  PHQ-9 Score 6  Difficult doing work/chores Not difficult at all  ALLERGIES: Allergies  Allergen Reactions  . Other Other (See Comments)    Seasonal allergies(pollen & grass)    MEDICATIONS: Current Outpatient Medications on File Prior to Visit  Medication Sig Dispense Refill  . brimonidine (ALPHAGAN) 0.2 % ophthalmic solution Place 1 drop into both eyes 2 (two) times daily. 5 mL 12  . Cholecalciferol (VITAMIN D)  2000 units CAPS Take 1 capsule (2,000 Units total) by mouth daily. 30 capsule 5  . fluticasone (FLONASE) 50 MCG/ACT nasal spray Place 2 sprays into both nostrils daily. 16 g 6  . levocetirizine (XYZAL) 5 MG tablet Take 1 tablet (5 mg total) by mouth every evening. 30 tablet 5  . levothyroxine (SYNTHROID, LEVOTHROID) 100 MCG tablet TAKE ONE (1) TABLET BY MOUTH EVERY DAY BEFORE BREAKFAST 90 tablet 0  . losartan (COZAAR) 50 MG tablet TAKE ONE (1) TABLET BY MOUTH EVERY DAY 90 tablet 1  . Multiple Vitamins-Minerals (MULTIVITAMIN WITH MINERALS) tablet Take 1 tablet by mouth daily.    Marland Kitchen omeprazole (PRILOSEC) 20 MG capsule Take 1 capsule (20 mg total) by mouth daily. 90 capsule 1  . polyethylene glycol (MIRALAX / GLYCOLAX) packet Take 17 g by mouth daily. 14 each 0  . ranitidine (ZANTAC) 300 MG tablet Take 1 tablet (300 mg total) by mouth at bedtime. 30 tablet 5   No current facility-administered medications on file prior to visit.     PAST MEDICAL HISTORY: Past Medical History:  Diagnosis Date  . Arthritis   . Atrophic rhinitis 04/21/2016  . Constipation   . Decreased hearing   . Diverticulitis   . Dry eyes   . Fatigue   . Floaters in visual field   . Gall bladder disease   . GERD (gastroesophageal reflux disease)   . Glaucoma   . Hiatal hernia with gastroesophageal reflux   . History of blood transfusion    for Knee replacement  . Hyperlipidemia, mild 04/02/2016  . Hypertension   . Hypothyroidism   . Joint pain   . Migraine aura without headache   . Muscle pain   . Muscle stiffness   . Nasal congestion   . Obesity 04/02/2016  . Osteoarthritis   . Parotiditis 04/21/2016  . Vitamin D deficiency 04/02/2016    PAST SURGICAL HISTORY: Past Surgical History:  Procedure Laterality Date  . APPENDECTOMY    . cataract surgery  2008  . CHOLECYSTECTOMY  2010  . JOINT REPLACEMENT Left    2012  . MASTOIDECTOMY    . POSTERIOR CERVICAL FUSION/FORAMINOTOMY N/A 04/27/2017   Procedure: CERVICAL  FIVE-SIX  OPEN REDUCTION OF FRACTURE, CERVICAL THREE-SEVEN  DORSAL FIXATION AND FUSION;  Surgeon: Ditty, Kevan Ny, MD;  Location: [place];  Service: Neurosurgery;  Laterality: N/A;  . SHOULDER ARTHROSCOPY Bilateral prior to 2010  . TONSILLECTOMY    . TOTAL HIP ARTHROPLASTY Right 03/22/2015   Procedure: RIGHT TOTAL HIP ARTHROPLASTY ANTERIOR APPROACH;  Surgeon: Dorna Leitz, MD;  Location: [place];  Service: Orthopedics;  Laterality: Right;  . TOTAL KNEE ARTHROPLASTY Bilateral 2007  . TUBAL LIGATION      SOCIAL HISTORY: Social History   Tobacco Use  . Smoking status: Former Research scientist (life sciences)  . Smokeless tobacco: Never Used  . Tobacco comment: Stopped 50 years ago.   Substance Use Topics  . Alcohol use: Yes    Alcohol/week: 0.0 standard drinks    Comment: socially  . Drug use: No    FAMILY HISTORY: Family History  Problem Relation Age of Onset  . Dementia Mother   .  Hypertension Mother   . Obesity Mother   . Heart disease Father   . Hypertension Father   . Stroke Father   . Diabetes Father   . Hyperlipidemia Father   . Alcoholism Father   . Obesity Father   . Arthritis Daughter   . Cancer Maternal Grandfather        colon cancer    ROS: Review of Systems  Constitutional: Positive for malaise/fatigue.  HENT: Positive for congestion (nasal stuffiness) and hearing loss.        Nasal Discharge  Eyes:       Floaters  Cardiovascular: Negative for chest pain and orthopnea.  Gastrointestinal: Positive for constipation and heartburn. Negative for nausea and vomiting.  Genitourinary: Negative for frequency.  Musculoskeletal:       Muscle Stiffness Muscle or Joint Pain Negative for muscle weakness  Skin:       Dryness   Endo/Heme/Allergies: Negative for polydipsia.    PHYSICAL EXAM: Blood pressure (!) 149/78, pulse 62, temperature (!) 97.5 F (36.4 C), temperature source Oral, height 5\' 5"  (1.651 m), weight 210 lb (95.3 kg), SpO2 96 %. Body mass index is 34.95 kg/m. Physical  Exam  Constitutional: She is oriented to person, place, and time. She appears well-developed and well-nourished.  HENT:  Head: Normocephalic and atraumatic.  Nose: Nose normal.  Mallanpati = 3  Eyes: EOM are normal. No scleral icterus.  Neck: Normal range of motion. Neck supple. No thyromegaly present.  Cardiovascular: Normal rate and regular rhythm.  Pulmonary/Chest: Effort normal. No respiratory distress.  Abdominal: Soft. There is no tenderness.  + obesity  Musculoskeletal: She exhibits edema (trace edema bilateral lower extremities).  Range of Motion normal in all 4 extremities  Neurological: She is alert and oriented to person, place, and time. Coordination normal.  Skin: Skin is warm and dry.  Psychiatric: She has a normal mood and affect. Her behavior is normal.  Vitals reviewed.   RECENT LABS AND TESTS: BMET    Component Value Date/Time   NA 136 01/28/2018 1030   K 4.4 01/28/2018 1030   CL 100 01/28/2018 1030   CO2 27 01/28/2018 1030   GLUCOSE 113 (H) 01/28/2018 1030   BUN 13 01/28/2018 1030   CREATININE 0.67 01/28/2018 1030   CALCIUM 9.6 01/28/2018 1030   GFRNONAA >60 05/15/2017 0524   GFRAA >60 05/15/2017 0524   Lab Results  Component Value Date   HGBA1C 6.2 (H) 05/30/2018   Lab Results  Component Value Date   INSULIN 15.5 05/30/2018   CBC    Component Value Date/Time   WBC 5.4 01/28/2018 1030   RBC 4.50 01/28/2018 1030   HGB 14.5 01/28/2018 1030   HCT 43.0 01/28/2018 1030   PLT 217.0 01/28/2018 1030   MCV 95.6 01/28/2018 1030   MCH 30.6 05/15/2017 0524   MCHC 33.8 01/28/2018 1030   RDW 13.1 01/28/2018 1030   LYMPHSABS 1.4 05/15/2017 0524   MONOABS 0.6 05/15/2017 0524   EOSABS 0.3 05/15/2017 0524   BASOSABS 0.0 05/15/2017 0524   Iron/TIBC/Ferritin/ %Sat No results found for: IRON, TIBC, FERRITIN, IRONPCTSAT Lipid Panel     Component Value Date/Time   CHOL 230 (H) 01/28/2018 1030   TRIG 212.0 (H) 01/28/2018 1030   HDL 59.90 01/28/2018 1030     CHOLHDL 4 01/28/2018 1030   VLDL 42.4 (H) 01/28/2018 1030   LDLCALC 102 (H) 10/18/2017 1131   LDLDIRECT 144.0 01/28/2018 1030   Hepatic Function Panel     Component Value  Date/Time   PROT 7.2 01/28/2018 1030   ALBUMIN 4.5 01/28/2018 1030   AST 17 01/28/2018 1030   ALT 14 01/28/2018 1030   ALKPHOS 86 01/28/2018 1030   BILITOT 0.4 01/28/2018 1030      Component Value Date/Time   TSH 3.62 01/28/2018 1030   TSH 2.46 10/18/2017 1131   TSH 1.04 06/14/2017 1228    ECG  shows NSR with a rate of 64 BPM INDIRECT CALORIMETER done today shows a VO2 of 247 and a REE of 1723.  Her calculated basal metabolic rate is 2130 thus her basal metabolic rate is better than expected.    ASSESSMENT AND PLAN: Other fatigue - Plan: EKG 12-Lead  SOB (shortness of breath) on exertion  Essential hypertension  Vitamin D deficiency - Plan: VITAMIN D 25 Hydroxy (Vit-D Deficiency, Fractures)  Elevated glucose - Plan: Insulin, random, Hemoglobin A1c  Depression screening  Class 2 severe obesity with serious comorbidity and body mass index (BMI) of 35.0 to 35.9 in adult, unspecified obesity type ([place])  PLAN: Fatigue Catherine Coleman was informed that her fatigue may be related to obesity, depression or many other causes. Labs will be ordered, and in the meanwhile Yarelli has agreed to work on diet, exercise and weight loss to help with fatigue. Proper sleep hygiene was discussed including the need for 7-8 hours of quality sleep each night. A sleep study was not ordered based on symptoms and Epworth score. Zula will gradually increase her exercise to aerobics and water aerobics.  Dyspnea on exertion Catherine Coleman's shortness of breath appears to be obesity related and exercise induced. She has agreed to work on weight loss and gradually increase exercise to treat her exercise induced shortness of breath. If Lakiya follows our instructions and loses weight without improvement of her shortness of breath, we will  plan to refer to pulmonology. We will monitor this condition regularly. Catherine Coleman agrees to this plan. Catherine Coleman will gradually increase her exercise to aerobics and water aerobics.  Hypertension We discussed sodium restriction, working on healthy weight loss, and a regular exercise program as the means to achieve improved blood pressure control. Catherine Coleman agreed with this plan and agreed to follow up as directed. We will continue to monitor her blood pressure as well as her progress with the above lifestyle modifications. She will continue losartan and will watch for signs of hypotension as she continues her lifestyle modifications. Catherine Coleman will decrease sodium rich foods in her diet.  Vitamin D Deficiency Catherine Coleman was informed that low vitamin D levels contributes to fatigue and are associated with obesity, breast, and colon cancer. She agrees to continue OTC Vit D @2 ,000 IU daily. We will check vitamin D level today and will follow up for routine testing of vitamin D, at least 2-3 times per year. She was informed of the risk of over-replacement of vitamin D and agrees to not increase her dose unless she discusses this with [place] first. Catherine Coleman will increase vitamin D rich foods and will follow up as directed.  Elevated Glucose We will check Hgb A1c and fasting insulin level today. Catherine Coleman will work on decreasing carbohydrates in her diet. Luciel will follow up with our clinic at the agreed upon time.  Depression Screen Samary had a mildly positive depression screening. Depression is commonly associated with obesity and often results in emotional eating behaviors. We will monitor this closely and work on CBT to help improve the non-hunger eating patterns. Referral to Psychology may be required if no improvement is seen as  she continues in our clinic.  Obesity Lelaina is currently in the action stage of change and her goal is to continue with weight loss efforts. I recommend Salle begin the structured  treatment plan as follows:  She has agreed to follow the Category 2 plan + 100 calories Pierrette has been instructed to eventually work up to a goal of 150 minutes of combined cardio and strengthening exercise per week or continue pool walking 3 times per week for 1 hour for weight loss and overall health benefits. We discussed the following Behavioral Modification Strategies today: increase H2O intake, no skipping meals, increasing lean protein intake, decreasing simple carbohydrates and increasing vegetables  Fredrika will do more cooking at home.   She was informed of the importance of frequent follow up visits to maximize her success with intensive lifestyle modifications for her multiple health conditions. She was informed we would discuss her lab results at her next visit unless there is a critical issue that needs to be addressed sooner. Ximena agreed to keep her next visit at the agreed upon time to discuss these results.    OBESITY BEHAVIORAL INTERVENTION VISIT  Today's visit was # 1   Starting weight: 210 lbs Starting date: 05/30/18 Today's weight : 210 lbs  Today's date: 05/30/2018 Total lbs lost to date: 0 At least 15 minutes were spent on discussing the following behavioral intervention visit.   ASK: We discussed the diagnosis of obesity with [name] today and Emilene agreed to give [place] permission to discuss obesity behavioral modification therapy today.  ASSESS: Corena has the diagnosis of obesity and her BMI today is 34.95 Lashunda is in the action stage of change   ADVISE: Kylieann was educated on the multiple health risks of obesity as well as the benefit of weight loss to improve her health. She was advised of the need for long term treatment and the importance of lifestyle modifications to improve her current health and to decrease her risk of future health problems.  AGREE: Multiple dietary modification options and treatment options were discussed and  Bettye  agreed to follow the recommendations documented in the above note.  ARRANGE: Sulma was educated on the importance of frequent visits to treat obesity as outlined per CMS and USPSTF guidelines and agreed to schedule her next follow up appointment today.  Corey Skains, am acting as Location manager for [employer]. Owens Shark, DO  I have reviewed the above documentation for accuracy and completeness, and I agree with the above. -Jearld Lesch, DO

## 2018-06-01 DIAGNOSIS — I10 Essential (primary) hypertension: Secondary | ICD-10-CM | POA: Diagnosis not present

## 2018-06-01 DIAGNOSIS — Z6834 Body mass index (BMI) 34.0-34.9, adult: Secondary | ICD-10-CM | POA: Diagnosis not present

## 2018-06-01 DIAGNOSIS — M542 Cervicalgia: Secondary | ICD-10-CM | POA: Diagnosis not present

## 2018-06-07 ENCOUNTER — Encounter: Payer: Self-pay | Admitting: Family Medicine

## 2018-06-07 ENCOUNTER — Ambulatory Visit (HOSPITAL_BASED_OUTPATIENT_CLINIC_OR_DEPARTMENT_OTHER)
Admission: RE | Admit: 2018-06-07 | Discharge: 2018-06-07 | Disposition: A | Discharge status: Home / Self Care | Payer: Medicare Other | Source: Ambulatory Visit | Attending: Family Medicine | Admitting: Family Medicine

## 2018-06-07 ENCOUNTER — Ambulatory Visit (INDEPENDENT_AMBULATORY_CARE_PROVIDER_SITE_OTHER): Payer: Medicare Other | Admitting: Family Medicine

## 2018-06-07 VITALS — BP 122/74 | HR 64 | Temp 97.6°F | Resp 18 | Ht 65.0 in | Wt 209.6 lb

## 2018-06-07 DIAGNOSIS — E559 Vitamin D deficiency, unspecified: Secondary | ICD-10-CM

## 2018-06-07 DIAGNOSIS — I10 Essential (primary) hypertension: Secondary | ICD-10-CM | POA: Diagnosis not present

## 2018-06-07 DIAGNOSIS — M545 Low back pain: Secondary | ICD-10-CM

## 2018-06-07 DIAGNOSIS — M1990 Unspecified osteoarthritis, unspecified site: Secondary | ICD-10-CM | POA: Diagnosis not present

## 2018-06-07 DIAGNOSIS — M25519 Pain in unspecified shoulder: Secondary | ICD-10-CM

## 2018-06-07 DIAGNOSIS — E669 Obesity, unspecified: Secondary | ICD-10-CM

## 2018-06-07 DIAGNOSIS — R918 Other nonspecific abnormal finding of lung field: Secondary | ICD-10-CM | POA: Diagnosis not present

## 2018-06-07 DIAGNOSIS — R739 Hyperglycemia, unspecified: Secondary | ICD-10-CM

## 2018-06-07 DIAGNOSIS — Z8781 Personal history of (healed) traumatic fracture: Secondary | ICD-10-CM

## 2018-06-07 DIAGNOSIS — Z1239 Encounter for other screening for malignant neoplasm of breast: Secondary | ICD-10-CM

## 2018-06-07 DIAGNOSIS — R5381 Other malaise: Secondary | ICD-10-CM

## 2018-06-07 DIAGNOSIS — E039 Hypothyroidism, unspecified: Secondary | ICD-10-CM | POA: Diagnosis not present

## 2018-06-07 DIAGNOSIS — Z1231 Encounter for screening mammogram for malignant neoplasm of breast: Secondary | ICD-10-CM

## 2018-06-07 MED ORDER — TIZANIDINE HCL 2 MG PO TABS: 1.0000 mg | ORAL_TABLET | Freq: Every evening | ORAL | 0 refills | Status: DC | PRN

## 2018-06-07 MED ORDER — RANITIDINE HCL 300 MG PO TABS: 300.0000 mg | ORAL_TABLET | Freq: Every day | ORAL | 1 refills | Status: DC

## 2018-06-07 MED ORDER — LOSARTAN POTASSIUM 50 MG PO TABS: ORAL_TABLET | ORAL | 1 refills | Status: DC

## 2018-06-07 MED ORDER — LEVOTHYROXINE SODIUM 100 MCG PO TABS: ORAL_TABLET | ORAL | 1 refills | Status: DC

## 2018-06-07 MED ORDER — OMEPRAZOLE 20 MG PO CPDR: 20.0000 mg | DELAYED_RELEASE_CAPSULE | Freq: Every day | ORAL | 1 refills | Status: DC

## 2018-06-07 NOTE — Patient Instructions (Signed)

## 2018-06-08 ENCOUNTER — Other Ambulatory Visit: Payer: Self-pay

## 2018-06-08 MED ORDER — CEFDINIR 300 MG PO CAPS: 300.0000 mg | ORAL_CAPSULE | Freq: Two times a day (BID) | ORAL | 0 refills | Status: DC

## 2018-06-12 DIAGNOSIS — M545 Low back pain, unspecified: Secondary | ICD-10-CM | POA: Insufficient documentation

## 2018-06-12 NOTE — Assessment & Plan Note (Signed)
Well controlled, no changes to meds. Encouraged heart healthy diet such as the DASH diet and exercise as tolerated.  °

## 2018-06-12 NOTE — Assessment & Plan Note (Signed)
Encouraged DASH diet, decrease po intake and increase exercise as tolerated. Needs 7-8 hours of sleep nightly. Avoid trans fats, eat small, frequent meals every 4-5 hours with lean proteins, complex carbs and healthy fats. Minimize simple carbs, has joined healthy weight and wellness.

## 2018-06-12 NOTE — Progress Notes (Signed)
Subjective:    Patient ID: Catherine Coleman, female    DOB: 1932/01/16, 84 y.o.   MRN: [id number]  No chief complaint on file.   HPI Patient is in today for follow up. She continues to struggle with low back pain and recently has had an increase in stiffness and weakness in her right leg most. No recent trauma or falls. Has some urinary frequency and occasional incontinence but this is at her baseline. She continues to struggle with weakness and debility. Fatigue is noted. Denies CP/palp/SOB/HA/congestion/fevers/GI c/o. Taking meds as prescribed  Past Medical History:  Diagnosis Date  . Arthritis   . Atrophic rhinitis 04/21/2016  . Constipation   . Decreased hearing   . Diverticulitis   . Dry eyes   . Fatigue   . Floaters in visual field   . Gall bladder disease   . GERD (gastroesophageal reflux disease)   . Glaucoma   . Hiatal hernia with gastroesophageal reflux   . History of blood transfusion    for Knee replacement  . Hyperlipidemia, mild 04/02/2016  . Hypertension   . Hypothyroidism   . Joint pain   . Migraine aura without headache   . Muscle pain   . Muscle stiffness   . Nasal congestion   . Obesity 04/02/2016  . Osteoarthritis   . Parotiditis 04/21/2016  . Vitamin D deficiency 04/02/2016    Past Surgical History:  Procedure Laterality Date  . APPENDECTOMY    . cataract surgery  2008  . CHOLECYSTECTOMY  2010  . JOINT REPLACEMENT Left    2012  . MASTOIDECTOMY    . POSTERIOR CERVICAL FUSION/FORAMINOTOMY N/A 04/27/2017   Procedure: CERVICAL FIVE-SIX  OPEN REDUCTION OF FRACTURE, CERVICAL THREE-SEVEN  DORSAL FIXATION AND FUSION;  Surgeon: Ditty, Kevan Ny, MD;  Location: [place];  Service: Neurosurgery;  Laterality: N/A;  . SHOULDER ARTHROSCOPY Bilateral prior to 2010  . TONSILLECTOMY    . TOTAL HIP ARTHROPLASTY Right 03/22/2015   Procedure: RIGHT TOTAL HIP ARTHROPLASTY ANTERIOR APPROACH;  Surgeon: Dorna Leitz, MD;  Location: [place];  Service: Orthopedics;  Laterality:  Right;  . TOTAL KNEE ARTHROPLASTY Bilateral 2007  . TUBAL LIGATION      Family History  Problem Relation Age of Onset  . Dementia Mother   . Hypertension Mother   . Obesity Mother   . Heart disease Father   . Hypertension Father   . Stroke Father   . Diabetes Father   . Hyperlipidemia Father   . Alcoholism Father   . Obesity Father   . Arthritis Daughter   . Cancer Maternal Grandfather        colon cancer    Social History   Socioeconomic History  . Marital status: Widowed    Spouse name: Not on file  . Number of children: Not on file  . Years of education: Not on file  . Highest education level: Not on file  Occupational History  . Occupation: Retired  Scientific laboratory technician  . Financial resource strain: Not on file  . Food insecurity:    Worry: Not on file    Inability: Not on file  . Transportation needs:    Medical: Not on file    Non-medical: Not on file  Tobacco Use  . Smoking status: Former Research scientist (life sciences)  . Smokeless tobacco: Never Used  . Tobacco comment: Stopped 50 years ago.   Substance and Sexual Activity  . Alcohol use: Yes    Alcohol/week: 0.0 standard drinks    Comment:  socially  . Drug use: No  . Sexual activity: Never  Lifestyle  . Physical activity:    Days per week: Not on file    Minutes per session: Not on file  . Stress: Not on file  Relationships  . Social connections:    Talks on phone: Not on file    Gets together: Not on file    Attends religious service: Not on file    Active member of club or organization: Not on file    Attends meetings of clubs or organizations: Not on file    Relationship status: Not on file  . Intimate partner violence:    Fear of current or ex partner: Not on file    Emotionally abused: Not on file    Physically abused: Not on file    Forced sexual activity: Not on file  Other Topics Concern  . Not on file  Social History Narrative   Lives at [facility], widowed 7 years, no dietary restrictions. Retired from  neuropsychiatry work.     Outpatient Medications Prior to Visit  Medication Sig Dispense Refill  . brimonidine (ALPHAGAN) 0.2 % ophthalmic solution Place 1 drop into both eyes 2 (two) times daily. 5 mL 12  . Cholecalciferol (VITAMIN D) 2000 units CAPS Take 1 capsule (2,000 Units total) by mouth daily. 30 capsule 5  . fluticasone (FLONASE) 50 MCG/ACT nasal spray Place 2 sprays into both nostrils daily. 16 g 6  . levocetirizine (XYZAL) 5 MG tablet Take 1 tablet (5 mg total) by mouth every evening. 30 tablet 5  . Multiple Vitamins-Minerals (MULTIVITAMIN WITH MINERALS) tablet Take 1 tablet by mouth daily.    . polyethylene glycol (MIRALAX / GLYCOLAX) packet Take 17 g by mouth daily. 14 each 0  . levothyroxine (SYNTHROID, LEVOTHROID) 100 MCG tablet TAKE ONE (1) TABLET BY MOUTH EVERY DAY BEFORE BREAKFAST 90 tablet 0  . losartan (COZAAR) 50 MG tablet TAKE ONE (1) TABLET BY MOUTH EVERY DAY 90 tablet 1  . omeprazole (PRILOSEC) 20 MG capsule Take 1 capsule (20 mg total) by mouth daily. 90 capsule 1  . ranitidine (ZANTAC) 300 MG tablet Take 1 tablet (300 mg total) by mouth at bedtime. 30 tablet 5   No facility-administered medications prior to visit.     Allergies  Allergen Reactions  . Other Other (See Comments)    Seasonal allergies(pollen & grass)    Review of Systems  Constitutional: Negative for fever and malaise/fatigue.  HENT: Negative for congestion.   Eyes: Negative for blurred vision.  Respiratory: Negative for shortness of breath.   Cardiovascular: Negative for chest pain, palpitations and leg swelling.  Gastrointestinal: Negative for abdominal pain, blood in stool and nausea.  Genitourinary: Positive for frequency. Negative for dysuria.  Musculoskeletal: Positive for back pain and joint pain. Negative for falls.  Skin: Positive for itching and rash.  Neurological: Positive for focal weakness. Negative for dizziness, loss of consciousness and headaches.  Endo/Heme/Allergies:  Negative for environmental allergies.  Psychiatric/Behavioral: Negative for depression. The patient is not nervous/anxious.        Objective:    Physical Exam  Constitutional: She is oriented to person, place, and time. She appears well-developed and well-nourished. No distress.  HENT:  Head: Normocephalic and atraumatic.  Nose: Nose normal.  Eyes: Right eye exhibits no discharge. Left eye exhibits no discharge.  Neck: Normal range of motion. Neck supple.  Cardiovascular: Normal rate and regular rhythm.  No murmur heard. Pulmonary/Chest: Effort normal and breath sounds  normal.  Dark patch on right arm. No erythema  Abdominal: Soft. Bowel sounds are normal. There is no tenderness.  Musculoskeletal: She exhibits no edema.  Neurological: She is alert and oriented to person, place, and time.  Skin: Skin is warm and dry.  Psychiatric: She has a normal mood and affect.  Nursing note and vitals reviewed.   BP 122/74 (BP Location: Left Arm, Patient Position: Sitting, Cuff Size: Normal)   Pulse 64   Temp 97.6 F (36.4 C) (Oral)   Resp 18   Ht 5\' 5"  (1.651 m)   Wt 209 lb 9.6 oz (95.1 kg)   SpO2 94%   BMI 34.88 kg/m  Wt Readings from Last 3 Encounters:  06/07/18 209 lb 9.6 oz (95.1 kg)  05/30/18 210 lb (95.3 kg)  05/27/18 215 lb 6.4 oz (97.7 kg)     Lab Results  Component Value Date   WBC 5.4 01/28/2018   HGB 14.5 01/28/2018   HCT 43.0 01/28/2018   PLT 217.0 01/28/2018   GLUCOSE 113 (H) 01/28/2018   CHOL 230 (H) 01/28/2018   TRIG 212.0 (H) 01/28/2018   HDL 59.90 01/28/2018   LDLDIRECT 144.0 01/28/2018   LDLCALC 102 (H) 10/18/2017   ALT 14 01/28/2018   AST 17 01/28/2018   NA 136 01/28/2018   K 4.4 01/28/2018   CL 100 01/28/2018   CREATININE 0.67 01/28/2018   BUN 13 01/28/2018   CO2 27 01/28/2018   TSH 3.62 01/28/2018   INR 1.05 04/28/2017   HGBA1C 6.2 (H) 05/30/2018    Lab Results  Component Value Date   TSH 3.62 01/28/2018   Lab Results  Component Value  Date   WBC 5.4 01/28/2018   HGB 14.5 01/28/2018   HCT 43.0 01/28/2018   MCV 95.6 01/28/2018   PLT 217.0 01/28/2018   Lab Results  Component Value Date   NA 136 01/28/2018   K 4.4 01/28/2018   CO2 27 01/28/2018   GLUCOSE 113 (H) 01/28/2018   BUN 13 01/28/2018   CREATININE 0.67 01/28/2018   BILITOT 0.4 01/28/2018   ALKPHOS 86 01/28/2018   AST 17 01/28/2018   ALT 14 01/28/2018   PROT 7.2 01/28/2018   ALBUMIN 4.5 01/28/2018   CALCIUM 9.6 01/28/2018   ANIONGAP 8 05/15/2017   GFR 89.13 01/28/2018   Lab Results  Component Value Date   CHOL 230 (H) 01/28/2018   Lab Results  Component Value Date   HDL 59.90 01/28/2018   Lab Results  Component Value Date   LDLCALC 102 (H) 10/18/2017   Lab Results  Component Value Date   TRIG 212.0 (H) 01/28/2018   Lab Results  Component Value Date   CHOLHDL 4 01/28/2018   Lab Results  Component Value Date   HGBA1C 6.2 (H) 05/30/2018       Assessment & Plan:   Problem List Items Addressed This Visit    Hypertension    Well controlled, no changes to meds. Encouraged heart healthy diet such as the DASH diet and exercise as tolerated.       Relevant Medications   losartan (COZAAR) 50 MG tablet   Arthritis - Primary   Relevant Medications   tiZANidine (ZANAFLEX) 2 MG tablet   Other Relevant Orders   Ambulatory referral to Physical Therapy   Ambulatory referral to Orthopedic Surgery   Hypothyroidism    On Levothyroxine, continue to monitor      Relevant Medications   levothyroxine (SYNTHROID, LEVOTHROID) 100 MCG tablet   Obesity  Encouraged DASH diet, decrease po intake and increase exercise as tolerated. Needs 7-8 hours of sleep nightly. Avoid trans fats, eat small, frequent meals every 4-5 hours with lean proteins, complex carbs and healthy fats. Minimize simple carbs, has joined healthy weight and wellness.       Vitamin D deficiency    suppplement and monitor      Breast cancer screening   Relevant Orders   MM  3D SCREEN BREAST BILATERAL   History of cervical fracture   Relevant Orders   Ambulatory referral to Physical Therapy   Physical deconditioning    Referred for physical therapy to help improve her strength and conditioning.       Relevant Orders   Ambulatory referral to Physical Therapy   Hyperglycemia    hgba1c acceptable, minimize simple carbs. Increase exercise as tolerated.       Low back pain    Referred to ortho for further evaluation and treatement as her pain is worsening with radicular symptoms      Relevant Medications   tiZANidine (ZANAFLEX) 2 MG tablet   Other Relevant Orders   DG Chest 2 View (Completed)   Ambulatory referral to Orthopedic Surgery    Other Visit Diagnoses    Shoulder pain, unspecified chronicity, unspecified laterality       Relevant Orders   Ambulatory referral to Physical Therapy   Ambulatory referral to Orthopedic Surgery      I am having [name] start on tiZANidine. I am also having her maintain her multivitamin with minerals, brimonidine, Vitamin D, polyethylene glycol, fluticasone, levocetirizine, levothyroxine, omeprazole, losartan, and ranitidine.  Meds ordered this encounter  Medications  . levothyroxine (SYNTHROID, LEVOTHROID) 100 MCG tablet    Sig: TAKE ONE (1) TABLET BY MOUTH EVERY DAY BEFORE BREAKFAST    Dispense:  90 tablet    Refill:  1  . omeprazole (PRILOSEC) 20 MG capsule    Sig: Take 1 capsule (20 mg total) by mouth daily.    Dispense:  90 capsule    Refill:  1  . losartan (COZAAR) 50 MG tablet    Sig: TAKE ONE (1) TABLET BY MOUTH EVERY DAY    Dispense:  90 tablet    Refill:  1  . ranitidine (ZANTAC) 300 MG tablet    Sig: Take 1 tablet (300 mg total) by mouth at bedtime.    Dispense:  90 tablet    Refill:  1  . tiZANidine (ZANAFLEX) 2 MG tablet    Sig: Take 0.5-1 tablets (1-2 mg total) by mouth at bedtime as needed for muscle spasms.    Dispense:  30 tablet    Refill:  0     Penni Homans, MD

## 2018-06-12 NOTE — Assessment & Plan Note (Signed)
Referred for physical therapy to help improve her strength and conditioning.

## 2018-06-12 NOTE — Assessment & Plan Note (Signed)
hgba1c acceptable, minimize simple carbs. Increase exercise as tolerated.  

## 2018-06-12 NOTE — Assessment & Plan Note (Signed)
On Levothyroxine, continue to monitor 

## 2018-06-12 NOTE — Assessment & Plan Note (Signed)
Referred to ortho for further evaluation and treatement as her pain is worsening with radicular symptoms

## 2018-06-12 NOTE — Assessment & Plan Note (Signed)
suppplement and monitor

## 2018-06-14 ENCOUNTER — Other Ambulatory Visit: Payer: Self-pay | Admitting: Family Medicine

## 2018-06-14 ENCOUNTER — Ambulatory Visit (INDEPENDENT_AMBULATORY_CARE_PROVIDER_SITE_OTHER): Payer: Medicare Other | Admitting: Bariatrics

## 2018-06-14 ENCOUNTER — Encounter: Payer: Self-pay | Admitting: Family Medicine

## 2018-06-14 VITALS — BP 143/85 | HR 65 | Temp 97.6°F | Ht 65.0 in | Wt 204.0 lb

## 2018-06-14 DIAGNOSIS — I10 Essential (primary) hypertension: Secondary | ICD-10-CM

## 2018-06-14 DIAGNOSIS — R7303 Prediabetes: Secondary | ICD-10-CM | POA: Insufficient documentation

## 2018-06-14 DIAGNOSIS — M545 Low back pain, unspecified: Secondary | ICD-10-CM

## 2018-06-14 DIAGNOSIS — Z6833 Body mass index (BMI) 33.0-33.9, adult: Secondary | ICD-10-CM

## 2018-06-14 DIAGNOSIS — E669 Obesity, unspecified: Secondary | ICD-10-CM | POA: Diagnosis not present

## 2018-06-14 DIAGNOSIS — E559 Vitamin D deficiency, unspecified: Secondary | ICD-10-CM

## 2018-06-14 MED ORDER — VITAMIN D (ERGOCALCIFEROL) 1.25 MG (50000 UNIT) PO CAPS: 50000.0000 [IU] | ORAL_CAPSULE | ORAL | 0 refills | Status: DC

## 2018-06-15 NOTE — Progress Notes (Signed)
Office: [phone]  /  Fax: [phone]   HPI:   Chief Complaint: OBESITY Catherine Coleman is here to discuss her progress with her obesity treatment plan. She is on the Category 2 plan and is following her eating plan approximately 75 % of the time. She states she is exercising 0 minutes 0 times per week. Catherine Coleman has not followed the plan that closely. She lives at [facility] and is getting her meals there.  Her weight is 204 lb (92.5 kg) today and has had a weight loss of 6 pounds over a period of 2 weeks since her last visit. She has lost 6 lbs since starting treatment with [place].  Hypertension Catherine Coleman is a 84 y.o. female with hypertension. She is taking losartan 50mg . Catherine Coleman's blood pressure is not currently well controlled. Her systolic pressure is in stage 2 and diastolic is in the stage 1 range. She is working on weight loss to help control her blood pressure with the goal of decreasing her risk of heart attack and stroke. Catherine Coleman denies chest pain.  Vitamin D deficiency Catherine Coleman has a diagnosis of vitamin D deficiency. She is currently taking vit D and denies nausea, vomiting or muscle weakness.  Pre-Diabetes Catherine Coleman has a diagnosis of pre-diabetes based on her elevated Hgb A1c and was informed this puts her at greater risk of developing diabetes. Her last A1c was 6.2 and her Insulin was 15.5 on 05/30/18. She is not taking metformin currently and continues to work on diet and exercise to decrease risk of diabetes. She denies polyphagia.  ALLERGIES: Allergies  Allergen Reactions  . Other Other (See Comments)    Seasonal allergies(pollen & grass)    MEDICATIONS: Current Outpatient Medications on File Prior to Visit  Medication Sig Dispense Refill  . brimonidine (ALPHAGAN) 0.2 % ophthalmic solution Place 1 drop into both eyes 2 (two) times daily. 5 mL 12  . cefdinir (OMNICEF) 300 MG capsule Take 1 capsule (300 mg total) by mouth 2 (two) times daily. 20 capsule 0  .  Cholecalciferol (VITAMIN D) 2000 units CAPS Take 1 capsule (2,000 Units total) by mouth daily. 30 capsule 5  . fluticasone (FLONASE) 50 MCG/ACT nasal spray Place 2 sprays into both nostrils daily. 16 g 6  . levocetirizine (XYZAL) 5 MG tablet Take 1 tablet (5 mg total) by mouth every evening. 30 tablet 5  . levothyroxine (SYNTHROID, LEVOTHROID) 100 MCG tablet TAKE ONE (1) TABLET BY MOUTH EVERY DAY BEFORE BREAKFAST 90 tablet 1  . losartan (COZAAR) 50 MG tablet TAKE ONE (1) TABLET BY MOUTH EVERY DAY 90 tablet 1  . Multiple Vitamins-Minerals (MULTIVITAMIN WITH MINERALS) tablet Take 1 tablet by mouth daily.    Marland Kitchen omeprazole (PRILOSEC) 20 MG capsule Take 1 capsule (20 mg total) by mouth daily. 90 capsule 1  . polyethylene glycol (MIRALAX / GLYCOLAX) packet Take 17 g by mouth daily. 14 each 0  . ranitidine (ZANTAC) 300 MG tablet Take 1 tablet (300 mg total) by mouth at bedtime. 90 tablet 1  . tiZANidine (ZANAFLEX) 2 MG tablet Take 0.5-1 tablets (1-2 mg total) by mouth at bedtime as needed for muscle spasms. 30 tablet 0   No current facility-administered medications on file prior to visit.     PAST MEDICAL HISTORY: Past Medical History:  Diagnosis Date  . Arthritis   . Atrophic rhinitis 04/21/2016  . Constipation   . Decreased hearing   . Diverticulitis   . Dry eyes   . Fatigue   . Floaters in  visual field   . Gall bladder disease   . GERD (gastroesophageal reflux disease)   . Glaucoma   . Hiatal hernia with gastroesophageal reflux   . History of blood transfusion    for Knee replacement  . Hyperlipidemia, mild 04/02/2016  . Hypertension   . Hypothyroidism   . Joint pain   . Migraine aura without headache   . Muscle pain   . Muscle stiffness   . Nasal congestion   . Obesity 04/02/2016  . Osteoarthritis   . Parotiditis 04/21/2016  . Vitamin D deficiency 04/02/2016    PAST SURGICAL HISTORY: Past Surgical History:  Procedure Laterality Date  . APPENDECTOMY    . cataract surgery  2008    . CHOLECYSTECTOMY  2010  . JOINT REPLACEMENT Left    2012  . MASTOIDECTOMY    . POSTERIOR CERVICAL FUSION/FORAMINOTOMY N/A 04/27/2017   Procedure: CERVICAL FIVE-SIX  OPEN REDUCTION OF FRACTURE, CERVICAL THREE-SEVEN  DORSAL FIXATION AND FUSION;  Surgeon: Ditty, Kevan Ny, MD;  Location: [place];  Service: Neurosurgery;  Laterality: N/A;  . SHOULDER ARTHROSCOPY Bilateral prior to 2010  . TONSILLECTOMY    . TOTAL HIP ARTHROPLASTY Right 03/22/2015   Procedure: RIGHT TOTAL HIP ARTHROPLASTY ANTERIOR APPROACH;  Surgeon: Dorna Leitz, MD;  Location: [place];  Service: Orthopedics;  Laterality: Right;  . TOTAL KNEE ARTHROPLASTY Bilateral 2007  . TUBAL LIGATION      SOCIAL HISTORY: Social History   Tobacco Use  . Smoking status: Former Research scientist (life sciences)  . Smokeless tobacco: Never Used  . Tobacco comment: Stopped 50 years ago.   Substance Use Topics  . Alcohol use: Yes    Alcohol/week: 0.0 standard drinks    Comment: socially  . Drug use: No    FAMILY HISTORY: Family History  Problem Relation Age of Onset  . Dementia Mother   . Hypertension Mother   . Obesity Mother   . Heart disease Father   . Hypertension Father   . Stroke Father   . Diabetes Father   . Hyperlipidemia Father   . Alcoholism Father   . Obesity Father   . Arthritis Daughter   . Cancer Maternal Grandfather        colon cancer    ROS: Review of Systems  Constitutional: Positive for weight loss.  Cardiovascular: Negative for chest pain.  Gastrointestinal: Negative for nausea and vomiting.  Musculoskeletal:       Negative for muscle weakness.  Endo/Heme/Allergies:       Negative for polyphagia.    PHYSICAL EXAM: Blood pressure (!) 143/85, pulse 65, temperature 97.6 F (36.4 C), temperature source Oral, height 5\' 5"  (1.651 m), weight 204 lb (92.5 kg), SpO2 94 %. Body mass index is 33.95 kg/m. Physical Exam  Constitutional: She is oriented to person, place, and time. She appears well-developed and well-nourished.   Cardiovascular: Normal rate.  Pulmonary/Chest: Effort normal.  Musculoskeletal: Normal range of motion.  Neurological: She is oriented to person, place, and time.  Skin: Skin is warm and dry.  Psychiatric: She has a normal mood and affect. Her behavior is normal.  Vitals reviewed.   RECENT LABS AND TESTS: BMET    Component Value Date/Time   NA 136 01/28/2018 1030   K 4.4 01/28/2018 1030   CL 100 01/28/2018 1030   CO2 27 01/28/2018 1030   GLUCOSE 113 (H) 01/28/2018 1030   BUN 13 01/28/2018 1030   CREATININE 0.67 01/28/2018 1030   CALCIUM 9.6 01/28/2018 1030   GFRNONAA >60 05/15/2017  [place] >60 05/15/2017 0524   Lab Results  Component Value Date   HGBA1C 6.2 (H) 05/30/2018   HGBA1C 6.1 01/28/2018   HGBA1C 6.4 10/18/2017   Lab Results  Component Value Date   INSULIN 15.5 05/30/2018   CBC    Component Value Date/Time   WBC 5.4 01/28/2018 1030   RBC 4.50 01/28/2018 1030   HGB 14.5 01/28/2018 1030   HCT 43.0 01/28/2018 1030   PLT 217.0 01/28/2018 1030   MCV 95.6 01/28/2018 1030   MCH 30.6 05/15/2017 0524   MCHC 33.8 01/28/2018 1030   RDW 13.1 01/28/2018 1030   LYMPHSABS 1.4 05/15/2017 0524   MONOABS 0.6 05/15/2017 0524   EOSABS 0.3 05/15/2017 0524   BASOSABS 0.0 05/15/2017 0524   Iron/TIBC/Ferritin/ %Sat No results found for: IRON, TIBC, FERRITIN, IRONPCTSAT Lipid Panel     Component Value Date/Time   CHOL 230 (H) 01/28/2018 1030   TRIG 212.0 (H) 01/28/2018 1030   HDL 59.90 01/28/2018 1030   CHOLHDL 4 01/28/2018 1030   VLDL 42.4 (H) 01/28/2018 1030   LDLCALC 102 (H) 10/18/2017 1131   LDLDIRECT 144.0 01/28/2018 1030   Hepatic Function Panel     Component Value Date/Time   PROT 7.2 01/28/2018 1030   ALBUMIN 4.5 01/28/2018 1030   AST 17 01/28/2018 1030   ALT 14 01/28/2018 1030   ALKPHOS 86 01/28/2018 1030   BILITOT 0.4 01/28/2018 1030      Component Value Date/Time   TSH 3.62 01/28/2018 1030   TSH 2.46 10/18/2017 1131   TSH 1.04 06/14/2017  1228    ASSESSMENT AND PLAN: Essential hypertension  Vitamin D deficiency - Plan: Vitamin D, Ergocalciferol, (DRISDOL) 50000 units CAPS capsule  Prediabetes  Class 1 obesity with serious comorbidity and body mass index (BMI) of 33.0 to 33.9 in adult, unspecified obesity type  PLAN:  Hypertension We discussed sodium restriction, working on healthy weight loss, and a regular exercise program as the means to achieve improved blood pressure control. Catherine Coleman agreed with this plan and agreed to follow up as directed. We will continue to monitor her blood pressure as well as her progress with the above lifestyle modifications. She has agreed to continue her medications as prescribed for hypertension. We may discuss changes in regimen if her BP remains high. She will watch for signs of hypotension as she continues her lifestyle modifications. Catherine Coleman agrees to follow up as directed in 2 weeks.  Vitamin D Deficiency Catherine Coleman was informed that low vitamin D levels contributes to fatigue and are associated with obesity, breast, and colon cancer. She agrees to continue to take prescription Vit D @50 ,000 IU, 1 capsule every week #4 with no refills. It will called into [place]. She agrees that she will follow up for routine testing of vitamin D, at least 2-3 times per year. She was informed of the risk of over-replacement of vitamin D and agrees to not increase her dose unless she discusses this with [place] first. Catherine Coleman agrees to follow up in 2 weeks.  Pre-Diabetes Catherine Coleman will continue to work on weight loss, exercise, and decreasing simple carbohydrates in her diet to help decrease the risk of diabetes. We discussed metformin including benefits and risks. She was informed that eating too many simple carbohydrates or too many calories at one sitting increases the likelihood of GI side effects. Catherine Coleman declined metformin for now and a prescription was notwritten at this time. She agrees to reduce  carbohydrates and increase protein with  no medications at this time. Jaspreet agreed to follow up with [place] as directed to monitor her progress.  Obesity Catherine Coleman is currently in the action stage of change. As such, her goal is to continue with weight loss efforts. She has agreed to follow the Category 2 plan and increase her protein and water intake. [place] has been instructed to talk to the physical therapist and start a program for exercise. We discussed the following Behavioral Modification Strategies today: increasing lean protein intake, decreasing simple carbohydrates, increasing vegetables, increase H2O, no skipping meals, and work on meal planning and easy cooking plans  Catherine Coleman has agreed to follow up with our clinic in 2 weeks. She was informed of the importance of frequent follow up visits to maximize her success with intensive lifestyle modifications for her multiple health conditions.   OBESITY BEHAVIORAL INTERVENTION VISIT  Today's visit was # 2   Starting weight: 210 lbs Starting date: 05/30/18 Today's weight : Weight: 204 lb (92.5 kg)  Today's date: 06/14/2018 Total lbs lost to date: 6 At least 15 minutes were spent on discussing the following behavioral intervention visit.   ASK: We discussed the diagnosis of obesity with Catherine Coleman today and Catherine Coleman agreed to give [place] permission to discuss obesity behavioral modification therapy today.  ASSESS: Catherine Coleman has the diagnosis of obesity and her BMI today is 33.95. Catherine Coleman is in the action stage of change.   ADVISE: Zuriyah was educated on the multiple health risks of obesity as well as the benefit of weight loss to improve her health. She was advised of the need for long term treatment and the importance of lifestyle modifications to improve her current health and to decrease her risk of future health problems.  AGREE: Multiple dietary modification options and treatment options were discussed and Bryleigh agreed to follow  the recommendations documented in the above note.  ARRANGE: Rozina was educated on the importance of frequent visits to treat obesity as outlined per CMS and USPSTF guidelines and agreed to schedule her next follow up appointment today.  I, Marcille Blanco, am acting as Location manager for [employer]. Owens Shark, DO  I have reviewed the above documentation for accuracy and completeness, and I agree with the above. -Jearld Lesch, DO

## 2018-06-17 ENCOUNTER — Ambulatory Visit (HOSPITAL_BASED_OUTPATIENT_CLINIC_OR_DEPARTMENT_OTHER)
Admission: RE | Admit: 2018-06-17 | Discharge: 2018-06-17 | Disposition: A | Discharge status: Home / Self Care | Payer: Medicare Other | Source: Ambulatory Visit | Attending: Family Medicine | Admitting: Family Medicine

## 2018-06-17 ENCOUNTER — Encounter (HOSPITAL_BASED_OUTPATIENT_CLINIC_OR_DEPARTMENT_OTHER): Payer: Self-pay

## 2018-06-17 DIAGNOSIS — Z1231 Encounter for screening mammogram for malignant neoplasm of breast: Secondary | ICD-10-CM | POA: Diagnosis not present

## 2018-06-17 DIAGNOSIS — Z1239 Encounter for other screening for malignant neoplasm of breast: Secondary | ICD-10-CM

## 2018-06-20 ENCOUNTER — Encounter: Payer: Self-pay | Admitting: Physical Therapy

## 2018-06-20 ENCOUNTER — Ambulatory Visit: Payer: Medicare Other | Attending: Family Medicine | Admitting: Physical Therapy

## 2018-06-20 DIAGNOSIS — M6281 Muscle weakness (generalized): Secondary | ICD-10-CM

## 2018-06-20 DIAGNOSIS — M546 Pain in thoracic spine: Secondary | ICD-10-CM | POA: Diagnosis not present

## 2018-06-20 DIAGNOSIS — M25511 Pain in right shoulder: Secondary | ICD-10-CM | POA: Diagnosis not present

## 2018-06-20 DIAGNOSIS — M542 Cervicalgia: Secondary | ICD-10-CM

## 2018-06-20 NOTE — Therapy (Signed)
[facility] [address]  [place], [place], [address] Phone: [phone]   Fax:  [phone]  Physical Therapy Evaluation  Patient Details  Name: Catherine Coleman MRN: [id number] Date of Birth: 07-27-1932 Referring Provider (PT): Penni Homans, MD   Encounter Date: 06/20/2018  PT End of Session - 06/20/18 1407    Visit Number  1    Number of Visits  8    Date for PT Re-Evaluation  08/01/18    Authorization Type  MCR/BCBS    PT Start Time  1315    PT Stop Time  1355    PT Time Calculation (min)  40 min    Activity Tolerance  Patient tolerated treatment well    Behavior During Therapy  [facility] for tasks assessed/performed       Past Medical History:  Diagnosis Date  . Arthritis   . Atrophic rhinitis 04/21/2016  . Constipation   . Decreased hearing   . Diverticulitis   . Dry eyes   . Fatigue   . Floaters in visual field   . Gall bladder disease   . GERD (gastroesophageal reflux disease)   . Glaucoma   . Hiatal hernia with gastroesophageal reflux   . History of blood transfusion    for Knee replacement  . Hyperlipidemia, mild 04/02/2016  . Hypertension   . Hypothyroidism   . Joint pain   . Migraine aura without headache   . Muscle pain   . Muscle stiffness   . Nasal congestion   . Obesity 04/02/2016  . Osteoarthritis   . Parotiditis 04/21/2016  . Vitamin D deficiency 04/02/2016    Past Surgical History:  Procedure Laterality Date  . APPENDECTOMY    . cataract surgery  2008  . CHOLECYSTECTOMY  2010  . JOINT REPLACEMENT Left    2012  . MASTOIDECTOMY    . POSTERIOR CERVICAL FUSION/FORAMINOTOMY N/A 04/27/2017   Procedure: CERVICAL FIVE-SIX  OPEN REDUCTION OF FRACTURE, CERVICAL THREE-SEVEN  DORSAL FIXATION AND FUSION;  Surgeon: Ditty, Kevan Ny, MD;  Location: [facility];  Service: Neurosurgery;  Laterality: N/A;  . SHOULDER ARTHROSCOPY Bilateral prior to 2010  . TONSILLECTOMY    . TOTAL HIP ARTHROPLASTY  Right 03/22/2015   Procedure: RIGHT TOTAL HIP ARTHROPLASTY ANTERIOR APPROACH;  Surgeon: Dorna Leitz, MD;  Location: [facility];  Service: Orthopedics;  Laterality: Right;  . TOTAL KNEE ARTHROPLASTY Bilateral 2007  . TUBAL LIGATION      There were no vitals filed for this visit.   Subjective Assessment - 06/20/18 1318    Subjective  Pt relays pain in Rt shoulder blade area that started about 2-3 weeks ago. She noticed some more pain following water aerobics. She relays Rt shoulder weakness and neck stiffness but not much pain in these areas. She says heat and rest helps with the pain.     Pertinent History  PMH: cerv fusion 2018, Rt TSA,  bilat TKA 2007, Rt THA 2016,HTN,obesity, vit D deficency,pre-DM,GERD,    Limitations  House hold activities;Lifting    Diagnostic tests  ecent neck x-ray: Posterior cervical fusion at C4-C7. Prominent facet disease at C3-C4. Bony encroachment of the rightforamen at C3-C4 and left foramen C6-7    Patient Stated Goals  get this pain to go away, find out what exercises I can do    Currently in Pain?  Yes    Pain Score  5     Pain Location  Thoracic    Pain Orientation  Right    Pain Descriptors / Indicators  Aching;Tightness    Pain Type  Acute pain    Pain Radiating Towards  Rt shoulder    Pain Onset  1 to 4 weeks ago    Pain Frequency  Intermittent    Aggravating Factors   sitting back, sometimes reaching or after water aerobics (she was encouraged to decrease frequency from 2 per week to 1 per week with water areobics)    Pain Relieving Factors  heat, rest         [facility] PT Assessment - 06/20/18 0001      Assessment   Medical Diagnosis  thoracic/scapular pain, neck stiffness, Rt shoulder weakness    Referring Provider (PT)  Penni Homans, MD    Onset Date/Surgical Date  --   3 weeks pain onset   Next MD Visit  --   not until Jan 2020   Prior Therapy  prior PT for various things      Precautions   Precautions  None      Balance Screen   Has the  patient fallen in the past 6 months  No      Prior Function   Level of Independence  Independent with basic ADLs    Vocation  Retired    Social research officer, government, Scientific laboratory technician   Overall Cognitive Status  Within Functional Limits for tasks assessed      Observation/Other Assessments   Focus on Therapeutic Outcomes (FOTO)   --   not done, multiple dx     Sensation   Light Touch  Appears Intact      Posture/Postural Control   Posture Comments  fwd head, thoracic kyphosisi      ROM / Strength   AROM / PROM / Strength  AROM;Strength      AROM   AROM Assessment Site  Shoulder;Cervical;Lumbar    Right/Left Shoulder  Right    Right Shoulder Flexion  150 Degrees    Right Shoulder ABduction  140 Degrees    Right Shoulder Internal Rotation  --   WFL   Right Shoulder External Rotation  --   [facility]   Cervical Flexion  30    Cervical Extension  35    Cervical - Right Side Bend  10    Cervical - Left Side Bend  10    Cervical - Right Rotation  35    Cervical - Left Rotation  55    Lumbar Flexion  WFL    Lumbar Extension  WFL    Lumbar - Right Side Bend  WFL    Lumbar - Left Side Bend  WFL    Lumbar - Right Rotation  WFL    Lumbar - Left Rotation  [facility]      Strength   Strength Assessment Site  Shoulder    Right/Left Shoulder  Right    Right Shoulder Flexion  4+/5    Right Shoulder ABduction  4/5    Right Shoulder Internal Rotation  4+/5    Right Shoulder External Rotation  4/5      Flexibility   Soft Tissue Assessment /Muscle Length  --   tight UT Rt>Lt     Palpation   Spinal mobility  decreased T-spine mobillity    Palpation comment  TTP Rt traps, and thoracic paraspinals, rhomboids, lat                Objective measurements completed on examination: See above  findings.      [facility] Adult PT Treatment/Exercise - 06/20/18 0001      Modalities   Modalities  Moist Heat      Moist Heat Therapy   Number Minutes Moist Heat  10 Minutes    Moist Heat Location   --   thoracic with HEP review     Manual Therapy   Manual therapy comments  consider for next visit             PT Education - 06/20/18 1406    Education Details  HEP, heat, POC    Person(s) Educated  Patient    Methods  Explanation;Demonstration;Verbal cues;Handout    Comprehension  Verbalized understanding;Need further instruction          PT Long Term Goals - 06/20/18 1425      PT LONG TERM GOAL #1   Title  Pt will be I and compliant with HEP. 6 weeks 08/01/18    Status  New      PT LONG TERM GOAL #2   Title  Pt will improve Rt shoulder strength to at least 4+/5 MMT to improve function. 6 weeks 08/01/18    Status  New      PT LONG TERM GOAL #3   Title  Pt will report no more than 2/10 overall pain with usual activity. 6 weeks 08/01/18    Status  New      PT LONG TERM GOAL #4   Title  Pt will improve neck side bend and rotation 5 deg each way to decrease neck stiffness and increase her ability to scan her enviornment. 6 weeks 08/01/18    Status  New             Plan - 06/20/18 1408    Clinical Impression Statement  Pt presents with chief complaint of Rt sided thoraic/scapular pain that appears muscular in nature but she does have spinal OA and cervical with foraminal enocroachment Rt C3-4, and Lt C6-7 as well. She has complex PMH with Rt TSA, cerv fusion in 2018. She has Rt shoulder weakness, neck stiffness, cervical-thoracic tightness, and increased pain limiting her full function. She will benefit from skilled PT to address her deficits.     History and Personal Factors relevant to plan of care:  PMH: cerv fusion 2018, Rt TSA,  bilat TKA 2007, Rt THA 2016,HTN,obesity, vit D deficency,pre-DM,GERD,    Clinical Presentation  Evolving    Clinical Presentation due to:  worsening pain, complex PMH, multiple dx and body parts on referral    Clinical Decision Making  High    Rehab Potential  Good    PT Frequency  Other (comment)   1-2   PT Duration  6 weeks     PT Treatment/Interventions  ADLs/Self Care Home Management;Cryotherapy;Electrical Stimulation;Iontophoresis 4mg /ml Dexamethasone;Moist Heat;Ultrasound    PT Next Visit Plan  thoracic and scapular stetching and strengtheing, consider STM to Rt scapular region, she prefers heat    Consulted and Agree with Plan of Care  Patient       Patient will benefit from skilled therapeutic intervention in order to improve the following deficits and impairments:  Decreased activity tolerance, Decreased endurance, Decreased range of motion, Decreased strength, Hypomobility, Impaired flexibility, Pain  Visit Diagnosis: Pain in thoracic spine  Cervicalgia  Acute pain of right shoulder  Muscle weakness (generalized)     Problem List Patient Active Problem List   Diagnosis Date Noted  . Prediabetes 06/14/2018  . Low back  pain 06/12/2018  . Hyperglycemia 01/28/2018  . Recurrent sinusitis 10/24/2017  . Urinary incontinence 10/18/2017  . Physical deconditioning 10/18/2017  . History of cervical fracture 06/09/2017  . Hypoxia 05/14/2017  . Sternal fracture 04/30/2017  . Parotiditis 04/21/2016  . Breast cancer screening 04/21/2016  . Atrophic rhinitis 04/21/2016  . Obesity 04/02/2016  . Diverticulosis of colon without hemorrhage 04/02/2016  . Vitamin D deficiency 04/02/2016  . Hyperlipidemia, mild 04/02/2016  . Hypertension   . Arthritis   . Hiatal hernia with gastroesophageal reflux   . Glaucoma   . Hypothyroidism   . Primary osteoarthritis of right hip 03/22/2015    Debbe Odea, PT, DPT 06/20/2018, 2:43 PM  [facility] [address]  [place], [place], [address] Phone: [phone]   Fax:  [phone]  Name: Catherine Coleman MRN: [id number] Date of Birth: Oct 10, 1931

## 2018-06-24 ENCOUNTER — Ambulatory Visit (INDEPENDENT_AMBULATORY_CARE_PROVIDER_SITE_OTHER): Payer: Medicare Other | Admitting: Physical Medicine and Rehabilitation

## 2018-06-24 ENCOUNTER — Encounter (INDEPENDENT_AMBULATORY_CARE_PROVIDER_SITE_OTHER): Payer: Self-pay | Admitting: Physical Medicine and Rehabilitation

## 2018-06-24 VITALS — BP 146/89 | HR 63 | Ht 67.0 in | Wt 205.0 lb

## 2018-06-24 DIAGNOSIS — M542 Cervicalgia: Secondary | ICD-10-CM

## 2018-06-24 DIAGNOSIS — M898X1 Other specified disorders of bone, shoulder: Secondary | ICD-10-CM

## 2018-06-24 DIAGNOSIS — M546 Pain in thoracic spine: Secondary | ICD-10-CM | POA: Diagnosis not present

## 2018-06-24 DIAGNOSIS — M961 Postlaminectomy syndrome, not elsewhere classified: Secondary | ICD-10-CM | POA: Diagnosis not present

## 2018-06-24 DIAGNOSIS — G8929 Other chronic pain: Secondary | ICD-10-CM

## 2018-06-24 DIAGNOSIS — R202 Paresthesia of skin: Secondary | ICD-10-CM

## 2018-06-24 NOTE — Progress Notes (Signed)
 .  Numeric Pain Rating Scale and Functional Assessment Average Pain 9 Pain Right Now 4 My pain is intermittent, stabbing and tingling Pain is worse with: bending and some activites Pain improves with: heat/ice   In the last MONTH (on 0-10 scale) has pain interfered with the following?  1. General activity like being  able to carry out your everyday physical activities such as walking, climbing stairs, carrying groceries, or moving a chair?  Rating(4)  2. Relation with others like being able to carry out your usual social activities and roles such as  activities at home, at work and in your community. Rating(3)  3. Enjoyment of life such that you have  been bothered by emotional problems such as feeling anxious, depressed or irritable?  Rating(0)

## 2018-06-27 ENCOUNTER — Ambulatory Visit (INDEPENDENT_AMBULATORY_CARE_PROVIDER_SITE_OTHER): Payer: Medicare Other | Admitting: Bariatrics

## 2018-06-27 VITALS — BP 142/82 | HR 54 | Temp 95.7°F | Ht 67.0 in | Wt 204.0 lb

## 2018-06-27 DIAGNOSIS — Z6834 Body mass index (BMI) 34.0-34.9, adult: Secondary | ICD-10-CM | POA: Diagnosis not present

## 2018-06-27 DIAGNOSIS — R7303 Prediabetes: Secondary | ICD-10-CM | POA: Diagnosis not present

## 2018-06-27 DIAGNOSIS — I10 Essential (primary) hypertension: Secondary | ICD-10-CM

## 2018-06-27 DIAGNOSIS — E669 Obesity, unspecified: Secondary | ICD-10-CM | POA: Diagnosis not present

## 2018-06-27 DIAGNOSIS — E559 Vitamin D deficiency, unspecified: Secondary | ICD-10-CM | POA: Diagnosis not present

## 2018-06-28 NOTE — Progress Notes (Signed)
Office: [phone]  /  Fax: [phone]   HPI:   Chief Complaint: OBESITY Catherine Coleman is here to discuss her progress with her obesity treatment plan. She is on the Category 2 plan and is following her eating plan approximately 10 % of the time. She states she is water walking 60 minutes 3 times per week. Catherine Coleman has been more social and to more parties and "just overdid it". Her hunger is minimal and she is having some cravings (carbohydrates).  Her weight is 204 lb (92.5 kg) today and has not lost weight since her last visit. She has lost 6 lbs since starting treatment with Catherine Coleman.  Hypertension Catherine Coleman is a 84 y.o. female with hypertension. She is working on weight loss to help control her blood pressure with the goal of decreasing her risk of heart attack and stroke. Catherine Coleman's blood pressure is still not well controlled. She is taking losartan 50mg . Catherine Coleman denies chest pain or increased shortness of breath.  Vitamin D deficiency Catherine Coleman has a diagnosis of vitamin D deficiency. She is currently taking high dose vit D.  Pre-Diabetes Catherine Coleman has a diagnosis of pre-diabetes based on her elevated Hgb A1c and was informed this puts her at greater risk of developing diabetes. Her last A1c was 6.2 and Insulin was 15.5 on 05/30/18. She is not taking metformin currently and continues to work on diet and exercise to decrease risk of diabetes. She denies polyphagia.  ALLERGIES: Allergies  Allergen Reactions  . Other Other (See Comments)    Seasonal allergies(pollen & grass)    MEDICATIONS: Current Outpatient Medications on File Prior to Visit  Medication Sig Dispense Refill  . brimonidine (ALPHAGAN) 0.2 % ophthalmic solution Place 1 drop into both eyes 2 (two) times daily. 5 mL 12  . cefdinir (OMNICEF) 300 MG capsule Take 1 capsule (300 mg total) by mouth 2 (two) times daily. 20 capsule 0  . Cholecalciferol (VITAMIN D) 2000 units CAPS Take 1 capsule (2,000 Units total) by mouth daily. 30  capsule 5  . fluticasone (FLONASE) 50 MCG/ACT nasal spray Place 2 sprays into both nostrils daily. 16 g 6  . levocetirizine (XYZAL) 5 MG tablet Take 1 tablet (5 mg total) by mouth every evening. 30 tablet 5  . levothyroxine (SYNTHROID, LEVOTHROID) 100 MCG tablet TAKE ONE (1) TABLET BY MOUTH EVERY DAY BEFORE BREAKFAST 90 tablet 1  . losartan (COZAAR) 50 MG tablet TAKE ONE (1) TABLET BY MOUTH EVERY DAY 90 tablet 1  . Multiple Vitamins-Minerals (MULTIVITAMIN WITH MINERALS) tablet Take 1 tablet by mouth daily.    Marland Kitchen omeprazole (PRILOSEC) 20 MG capsule Take 1 capsule (20 mg total) by mouth daily. 90 capsule 1  . polyethylene glycol (MIRALAX / GLYCOLAX) packet Take 17 g by mouth daily. 14 each 0  . ranitidine (ZANTAC) 300 MG tablet Take 1 tablet (300 mg total) by mouth at bedtime. 90 tablet 1  . tiZANidine (ZANAFLEX) 2 MG tablet Take 0.5-1 tablets (1-2 mg total) by mouth at bedtime as needed for muscle spasms. 30 tablet 0  . Vitamin D, Ergocalciferol, (DRISDOL) 50000 units CAPS capsule Take 1 capsule (50,000 Units total) by mouth every 7 (seven) days. 4 capsule 0   No current facility-administered medications on file prior to visit.     PAST MEDICAL HISTORY: Past Medical History:  Diagnosis Date  . Arthritis   . Atrophic rhinitis 04/21/2016  . Constipation   . Decreased hearing   . Diverticulitis   . Dry eyes   . Fatigue   .  Floaters in visual field   . Gall bladder disease   . GERD (gastroesophageal reflux disease)   . Glaucoma   . Hiatal hernia with gastroesophageal reflux   . History of blood transfusion    for Knee replacement  . Hyperlipidemia, mild 04/02/2016  . Hypertension   . Hypothyroidism   . Joint pain   . Migraine aura without headache   . Muscle pain   . Muscle stiffness   . Nasal congestion   . Obesity 04/02/2016  . Osteoarthritis   . Parotiditis 04/21/2016  . Vitamin D deficiency 04/02/2016    PAST SURGICAL HISTORY: Past Surgical History:  Procedure Laterality Date    . APPENDECTOMY    . cataract surgery  2008  . CHOLECYSTECTOMY  2010  . JOINT REPLACEMENT Left    2012  . MASTOIDECTOMY    . POSTERIOR CERVICAL FUSION/FORAMINOTOMY N/A 04/27/2017   Procedure: CERVICAL FIVE-SIX  OPEN REDUCTION OF FRACTURE, CERVICAL THREE-SEVEN  DORSAL FIXATION AND FUSION;  Surgeon: Ditty, Kevan Ny, MD;  Location: [facility];  Service: Neurosurgery;  Laterality: N/A;  . SHOULDER ARTHROSCOPY Bilateral prior to 2010  . TONSILLECTOMY    . TOTAL HIP ARTHROPLASTY Right 03/22/2015   Procedure: RIGHT TOTAL HIP ARTHROPLASTY ANTERIOR APPROACH;  Surgeon: Dorna Leitz, MD;  Location: [facility];  Service: Orthopedics;  Laterality: Right;  . TOTAL KNEE ARTHROPLASTY Bilateral 2007  . TUBAL LIGATION      SOCIAL HISTORY: Social History   Tobacco Use  . Smoking status: Former Research scientist (life sciences)  . Smokeless tobacco: Never Used  . Tobacco comment: Stopped 50 years ago.   Substance Use Topics  . Alcohol use: Yes    Alcohol/week: 0.0 standard drinks    Comment: socially  . Drug use: No    FAMILY HISTORY: Family History  Problem Relation Age of Onset  . Dementia Mother   . Hypertension Mother   . Obesity Mother   . Heart disease Father   . Hypertension Father   . Stroke Father   . Diabetes Father   . Hyperlipidemia Father   . Alcoholism Father   . Obesity Father   . Arthritis Daughter   . Cancer Maternal Grandfather        colon cancer    ROS: Review of Systems  Constitutional: Negative for weight loss.  Respiratory: Positive for shortness of breath.   Cardiovascular: Negative for chest pain.  Endo/Heme/Allergies:       Negative for polyphagia.    PHYSICAL EXAM: Blood pressure (!) 142/82, pulse (!) 54, temperature (!) 95.7 F (35.4 C), temperature source Oral, height 5\' 7"  (1.702 m), weight 204 lb (92.5 kg), SpO2 95 %. Body mass index is 31.95 kg/m. Physical Exam  Constitutional: She is oriented to person, place, and time. She appears well-developed and well-nourished.   Cardiovascular: Normal rate.  Pulmonary/Chest: Effort normal.  Musculoskeletal: Normal range of motion.  Neurological: She is oriented to person, place, and time.  Skin: Skin is warm and dry.  Psychiatric: She has a normal mood and affect. Her behavior is normal.  Vitals reviewed.   RECENT LABS AND TESTS: BMET    Component Value Date/Time   NA 136 01/28/2018 1030   K 4.4 01/28/2018 1030   CL 100 01/28/2018 1030   CO2 27 01/28/2018 1030   GLUCOSE 113 (H) 01/28/2018 1030   BUN 13 01/28/2018 1030   CREATININE 0.67 01/28/2018 1030   CALCIUM 9.6 01/28/2018 1030   GFRNONAA >60 05/15/2017 0524   GFRAA >60 05/15/2017 6433  Lab Results  Component Value Date   HGBA1C 6.2 (H) 05/30/2018   HGBA1C 6.1 01/28/2018   HGBA1C 6.4 10/18/2017   Lab Results  Component Value Date   INSULIN 15.5 05/30/2018   CBC    Component Value Date/Time   WBC 5.4 01/28/2018 1030   RBC 4.50 01/28/2018 1030   HGB 14.5 01/28/2018 1030   HCT 43.0 01/28/2018 1030   PLT 217.0 01/28/2018 1030   MCV 95.6 01/28/2018 1030   MCH 30.6 05/15/2017 0524   MCHC 33.8 01/28/2018 1030   RDW 13.1 01/28/2018 1030   LYMPHSABS 1.4 05/15/2017 0524   MONOABS 0.6 05/15/2017 0524   EOSABS 0.3 05/15/2017 0524   BASOSABS 0.0 05/15/2017 0524   Iron/TIBC/Ferritin/ %Sat No results found for: IRON, TIBC, FERRITIN, IRONPCTSAT Lipid Panel     Component Value Date/Time   CHOL 230 (H) 01/28/2018 1030   TRIG 212.0 (H) 01/28/2018 1030   HDL 59.90 01/28/2018 1030   CHOLHDL 4 01/28/2018 1030   VLDL 42.4 (H) 01/28/2018 1030   LDLCALC 102 (H) 10/18/2017 1131   LDLDIRECT 144.0 01/28/2018 1030   Hepatic Function Panel     Component Value Date/Time   PROT 7.2 01/28/2018 1030   ALBUMIN 4.5 01/28/2018 1030   AST 17 01/28/2018 1030   ALT 14 01/28/2018 1030   ALKPHOS 86 01/28/2018 1030   BILITOT 0.4 01/28/2018 1030      Component Value Date/Time   TSH 3.62 01/28/2018 1030   TSH 2.46 10/18/2017 1131   TSH 1.04 06/14/2017  1228   Results for DANIELL, PARADISE (MRN [id number]) as of 06/28/2018 15:56  Ref. Range 05/30/2018 10:31  Vitamin D, 25-Hydroxy Latest Ref Range: 30.0 - 100.0 ng/mL 38.2   ASSESSMENT AND PLAN: Essential hypertension  Vitamin D deficiency  Prediabetes  Class 1 obesity with serious comorbidity and body mass index (BMI) of 34.0 to 34.9 in adult, unspecified obesity type  PLAN:  Hypertension We discussed sodium restriction, working on healthy weight loss, and a regular exercise program as the means to achieve improved blood pressure control. Catherine Coleman agreed with this plan and agreed to follow up as directed. We will continue to monitor her blood pressure as well as her progress with the above lifestyle modifications. She will continue her antihypertensive medications as prescribed and will watch for signs of hypotension as she continues her lifestyle modifications. She agrees to follow up in 2 weeks.  Vitamin D Deficiency Catherine Coleman was informed that low vitamin D levels contributes to fatigue and are associated with obesity, breast, and colon cancer. She agrees to increase vitamin D rich foods in her diet and to continue to take prescription Vit D @50 ,000 IU every week and will follow up for routine testing of vitamin D, at least 2-3 times per year. Shewas informed of the risk of over-replacement of vitamin D and agrees to not increase her dose unless she discusses this with Catherine Coleman first. She agrees to follow up as directed.  Pre-Diabetes Catherine Coleman will continue to work on weight loss, exercise, and decreasing simple carbohydrates in her diet to help decrease the risk of diabetes. She was informed that eating too many simple carbohydrates or too many calories at one sitting increases the likelihood of GI side effects. Catherine Coleman agrees to continue to decrease carbohydrates and to follow up with Catherine Coleman as directed to monitor her progress.  Obesity Catherine Coleman is currently in the action stage of change. As such, her  goal is to continue with weight loss efforts. She has agreed  to follow the Category 2 plan. She agrees that she will do more meal planning and limit carbohydrates. Catherine Coleman has been instructed to work up to a goal of 150 minutes of combined cardio and strengthening exercise per week for weight loss and overall health benefits. We discussed the following Behavioral Modification Strategies today: increasing lean protein intake, decreasing simple carbohydrates, increasing vegetables, increase H2O intake, decreasing sodium intake, decrease eating out, no skipping meals, work on meal planning and easy cooking plans, keeping healthy foods in the home, and better snacking choices.  Catherine Coleman has agreed to follow up with our clinic in 2 weeks. She was informed of the importance of frequent follow up visits to maximize her success with intensive lifestyle modifications for her multiple health conditions.   OBESITY BEHAVIORAL INTERVENTION VISIT  Today's visit was # 3   Starting weight: 210 lbs Starting date: 05/30/18 Today's weight : Weight: 204 lb (92.5 kg)  Today's date: 06/27/2018 Total lbs lost to date: 6 At least 15 minutes were spent on discussing the following behavioral intervention visit.  ASK: We discussed the diagnosis of obesity with Catherine Coleman today and Catherine Coleman agreed to give Catherine Coleman permission to discuss obesity behavioral modification therapy today.  ASSESS: Catherine Coleman has the diagnosis of obesity and her BMI today is 31.94. Catherine Coleman is in the action stage of change.   ADVISE: Catherine Coleman was educated on the multiple health risks of obesity as well as the benefit of weight loss to improve her health. She was advised of the need for long term treatment and the importance of lifestyle modifications to improve her current health and to decrease her risk of future health problems.  AGREE: Multiple dietary modification options and treatment options were discussed and Catherine Coleman agreed to follow the  recommendations documented in the above note.  ARRANGE: Delisa was educated on the importance of frequent visits to treat obesity as outlined per CMS and USPSTF guidelines and agreed to schedule her next follow up appointment today.  I, Catherine Coleman, am acting as Location manager for Catherine Coleman. Catherine Shark, DO  I have reviewed the above documentation for accuracy and completeness, and I agree with the above. -Jearld Lesch, DO

## 2018-06-29 ENCOUNTER — Ambulatory Visit: Payer: Medicare Other | Admitting: Physical Therapy

## 2018-06-29 ENCOUNTER — Encounter (INDEPENDENT_AMBULATORY_CARE_PROVIDER_SITE_OTHER): Payer: Self-pay | Admitting: Bariatrics

## 2018-07-01 ENCOUNTER — Ambulatory Visit: Payer: Medicare Other | Admitting: Physical Therapy

## 2018-07-01 ENCOUNTER — Encounter: Payer: Self-pay | Admitting: Physical Therapy

## 2018-07-01 DIAGNOSIS — M25511 Pain in right shoulder: Secondary | ICD-10-CM | POA: Diagnosis not present

## 2018-07-01 DIAGNOSIS — M546 Pain in thoracic spine: Secondary | ICD-10-CM

## 2018-07-01 DIAGNOSIS — M542 Cervicalgia: Secondary | ICD-10-CM

## 2018-07-01 DIAGNOSIS — M6281 Muscle weakness (generalized): Secondary | ICD-10-CM | POA: Diagnosis not present

## 2018-07-01 NOTE — Therapy (Signed)
[facility] [address]  [place], [place], [address] Phone: [phone]   Fax:  [phone]  Physical Therapy Treatment  Patient Details  Name: Catherine Coleman MRN: [id number] Date of Birth: 12-26-1931 Referring Provider (PT): Penni Homans, MD   Encounter Date: 07/01/2018  PT End of Session - 07/01/18 0917    Visit Number  2    Number of Visits  8    Date for PT Re-Evaluation  08/01/18    Authorization Type  MCR/BCBS    PT Start Time  0832    PT Stop Time  0915    PT Time Calculation (min)  43 min    Activity Tolerance  Patient tolerated treatment well    Behavior During Therapy  [facility] for tasks assessed/performed       Past Medical History:  Diagnosis Date  . Arthritis   . Atrophic rhinitis 04/21/2016  . Constipation   . Decreased hearing   . Diverticulitis   . Dry eyes   . Fatigue   . Floaters in visual field   . Gall bladder disease   . GERD (gastroesophageal reflux disease)   . Glaucoma   . Hiatal hernia with gastroesophageal reflux   . History of blood transfusion    for Knee replacement  . Hyperlipidemia, mild 04/02/2016  . Hypertension   . Hypothyroidism   . Joint pain   . Migraine aura without headache   . Muscle pain   . Muscle stiffness   . Nasal congestion   . Obesity 04/02/2016  . Osteoarthritis   . Parotiditis 04/21/2016  . Vitamin D deficiency 04/02/2016    Past Surgical History:  Procedure Laterality Date  . APPENDECTOMY    . cataract surgery  2008  . CHOLECYSTECTOMY  2010  . JOINT REPLACEMENT Left    2012  . MASTOIDECTOMY    . POSTERIOR CERVICAL FUSION/FORAMINOTOMY N/A 04/27/2017   Procedure: CERVICAL FIVE-SIX  OPEN REDUCTION OF FRACTURE, CERVICAL THREE-SEVEN  DORSAL FIXATION AND FUSION;  Surgeon: Ditty, Kevan Ny, MD;  Location: [facility];  Service: Neurosurgery;  Laterality: N/A;  . SHOULDER ARTHROSCOPY Bilateral prior to 2010  . TONSILLECTOMY    . TOTAL HIP ARTHROPLASTY  Right 03/22/2015   Procedure: RIGHT TOTAL HIP ARTHROPLASTY ANTERIOR APPROACH;  Surgeon: Dorna Leitz, MD;  Location: [facility];  Service: Orthopedics;  Laterality: Right;  . TOTAL KNEE ARTHROPLASTY Bilateral 2007  . TUBAL LIGATION      There were no vitals filed for this visit.  Subjective Assessment - 07/01/18 0827    Subjective  Reports she felt okay after last session. Reports the exercises where she was laying on her back and twisting really hurt her back so she stopped it. Patient bringing in referral for DN and manual therapy.     Pertinent History  PMH: cerv fusion 2018, Rt TSA,  bilat TKA 2007, Rt THA 2016,HTN,obesity, vit D deficency,pre-DM,GERD,    Diagnostic tests  ecent neck x-ray: Posterior cervical fusion at C4-C7. Prominent facet disease at C3-C4. Bony encroachment of the rightforamen at C3-C4 and left foramen C6-7    Patient Stated Goals  get this pain to go away, find out what exercises I can do    Currently in Pain?  Yes    Pain Score  5     Pain Location  Thoracic   "shoulder blade"   Pain Orientation  Right    Pain Descriptors / Indicators  Burning    Pain Type  Acute pain                       [facility] Adult PT Treatment/Exercise - 07/01/18 0001      Exercises   Exercises  Shoulder;Lumbar;Neck      Neck Exercises: Seated   Neck Retraction  Limitations;15 reps    Neck Retraction Limitations  cues to avoid excessive capital flexion   pt reporting nonpainful crunching in neck   Other Seated Exercise  B rhomboid stretch 2x15"      Lumbar Exercises: Standing   Other Standing Lumbar Exercises  thoracic extension stretch over chair x10 to tolerance      Lumbar Exercises: Sidelying   Other Sidelying Lumbar Exercises  open book stretch B sides x10   cues to avoid pushing into pain on R side     Shoulder Exercises: Seated   Other Seated Exercises  UT stretch x30" each side to tolerance   cues to avoid shoulder hiking   Other Seated Exercises  LS stretch  x30" each side to tolerance      Shoulder Exercises: Pulleys   Flexion  3 minutes    Scaption  3 minutes      Shoulder Exercises: Stretch   Other Shoulder Stretches  scapular retraction 10x3"      Manual Therapy   Manual Therapy  Soft tissue mobilization;Myofascial release    Manual therapy comments  prone     Soft tissue mobilization  STM to R thoracic paraspinals, rhomboid, LS    Myofascial Release  manual TPR to R thoracic paraspinals, rhomboid, LS- very TTP in these areas             PT Education - 07/01/18 0917    Education Details  update to HEP; told to discontinue resistive band exercises for now    Person(s) Educated  Patient    Methods  Explanation;Demonstration;Tactile cues;Verbal cues;Handout    Comprehension  Verbalized understanding;Returned demonstration          PT Long Term Goals - 07/01/18 0923      PT LONG TERM GOAL #1   Title  Pt will be I and compliant with HEP. 6 weeks 08/01/18    Status  On-going      PT LONG TERM GOAL #2   Title  Pt will improve Rt shoulder strength to at least 4+/5 MMT to improve function. 6 weeks 08/01/18    Status  On-going      PT LONG TERM GOAL #3   Title  Pt will report no more than 2/10 overall pain with usual activity. 6 weeks 08/01/18    Status  On-going      PT LONG TERM GOAL #4   Title  Pt will improve neck side bend and rotation 5 deg each way to decrease neck stiffness and increase her ability to scan her enviornment. 6 weeks 08/01/18    Status  On-going            Plan - 07/01/18 0917    Clinical Impression Statement  Patient arrived bringing in MD's referral for DN and manual therapy for treatment of trigger point. Patient reporting pain with sidelying trunk rotation exercise- reviewed with exercise and corrected form, advising patient not to push into pain. Patient received STM and TPR to R thoracic paraspinals, rhomboid, LS with tenderness and soft tissue restriction in these areas. Reviewed HEP and  corrected form with UT stretch and cervical retractions. Updated HEP to include LS stretch,  scapular retractions, and rhomboid stretch as patient reporting good benefit from these exercises. Advised patient to discontinue resistance band exercises until they are reviewed in the clinic. Patient agreeable. Patient received DN information sheet, however reporting she would like to try conservative measures first.    PT Treatment/Interventions  ADLs/Self Care Home Management;Cryotherapy;Electrical Stimulation;Iontophoresis 4mg /ml Dexamethasone;Moist Heat;Ultrasound    Consulted and Agree with Plan of Care  Patient       Patient will benefit from skilled therapeutic intervention in order to improve the following deficits and impairments:  Decreased activity tolerance, Decreased endurance, Decreased range of motion, Decreased strength, Hypomobility, Impaired flexibility, Pain  Visit Diagnosis: Pain in thoracic spine  Cervicalgia  Acute pain of right shoulder  Muscle weakness (generalized)     Problem List Patient Active Problem List   Diagnosis Date Noted  . Prediabetes 06/14/2018  . Low back pain 06/12/2018  . Hyperglycemia 01/28/2018  . Recurrent sinusitis 10/24/2017  . Urinary incontinence 10/18/2017  . Physical deconditioning 10/18/2017  . History of cervical fracture 06/09/2017  . Hypoxia 05/14/2017  . Sternal fracture 04/30/2017  . Parotiditis 04/21/2016  . Breast cancer screening 04/21/2016  . Atrophic rhinitis 04/21/2016  . Obesity 04/02/2016  . Diverticulosis of colon without hemorrhage 04/02/2016  . Vitamin D deficiency 04/02/2016  . Hyperlipidemia, mild 04/02/2016  . Hypertension   . Arthritis   . Hiatal hernia with gastroesophageal reflux   . Glaucoma   . Hypothyroidism   . Primary osteoarthritis of right hip 03/22/2015    Janene Harvey, PT, DPT 07/01/18 9:25 AM   [facility] [address]  [place], [place], [address] Phone: [phone]   Fax:  [phone]  Name: Catherine Coleman MRN: [id number] Date of Birth: November 11, 1931

## 2018-07-01 NOTE — Patient Instructions (Addendum)

## 2018-07-04 ENCOUNTER — Encounter: Payer: Medicare Other | Admitting: Physical Therapy

## 2018-07-12 ENCOUNTER — Ambulatory Visit (INDEPENDENT_AMBULATORY_CARE_PROVIDER_SITE_OTHER): Payer: Medicare Other | Admitting: Bariatrics

## 2018-07-12 ENCOUNTER — Encounter (INDEPENDENT_AMBULATORY_CARE_PROVIDER_SITE_OTHER): Payer: Self-pay | Admitting: Bariatrics

## 2018-07-12 VITALS — BP 121/7 | HR 60 | Temp 97.4°F | Ht 67.0 in | Wt 204.0 lb

## 2018-07-12 DIAGNOSIS — E559 Vitamin D deficiency, unspecified: Secondary | ICD-10-CM | POA: Diagnosis not present

## 2018-07-12 DIAGNOSIS — E669 Obesity, unspecified: Secondary | ICD-10-CM

## 2018-07-12 DIAGNOSIS — R7303 Prediabetes: Secondary | ICD-10-CM

## 2018-07-12 DIAGNOSIS — Z6832 Body mass index (BMI) 32.0-32.9, adult: Secondary | ICD-10-CM | POA: Diagnosis not present

## 2018-07-12 NOTE — Progress Notes (Signed)
Office: [phone]  /  Fax: [phone]   HPI:   Chief Complaint: OBESITY Macon is here to discuss her progress with her obesity treatment plan. She is on the  follow the Category 2 plan and is following her eating plan approximately 25 % of the time. She states she is exercising 60 minutes 3 times per week. Edelmira has been away for the weekend, traveling and eating in restaurants.   Her weight is 204 lb (92.5 kg) today and maintained since her last visit. She has lost 6 lbs since starting treatment with [place].  Vitamin D deficiency Maila has a diagnosis of vitamin D deficiency. She is currently taking vit D and denies nausea, vomiting or muscle weakness.  Pre-Diabetes Damiah has a diagnosis of prediabetes based on her elevated HgA1c and was informed this puts her at greater risk of developing diabetes. She is not taking metformin currently and continues to work on diet and exercise to decrease risk of diabetes. She denies nausea or hypoglycemia.    ALLERGIES: Allergies  Allergen Reactions  . Other Other (See Comments)    Seasonal allergies(pollen & grass)    MEDICATIONS: Current Outpatient Medications on File Prior to Visit  Medication Sig Dispense Refill  . brimonidine (ALPHAGAN) 0.2 % ophthalmic solution Place 1 drop into both eyes 2 (two) times daily. 5 mL 12  . cefdinir (OMNICEF) 300 MG capsule Take 1 capsule (300 mg total) by mouth 2 (two) times daily. 20 capsule 0  . Cholecalciferol (VITAMIN D) 2000 units CAPS Take 1 capsule (2,000 Units total) by mouth daily. 30 capsule 5  . fluticasone (FLONASE) 50 MCG/ACT nasal spray Place 2 sprays into both nostrils daily. 16 g 6  . levocetirizine (XYZAL) 5 MG tablet Take 1 tablet (5 mg total) by mouth every evening. 30 tablet 5  . levothyroxine (SYNTHROID, LEVOTHROID) 100 MCG tablet TAKE ONE (1) TABLET BY MOUTH EVERY DAY BEFORE BREAKFAST 90 tablet 1  . losartan (COZAAR) 50 MG tablet TAKE ONE (1) TABLET BY MOUTH EVERY DAY 90  tablet 1  . Multiple Vitamins-Minerals (MULTIVITAMIN WITH MINERALS) tablet Take 1 tablet by mouth daily.    Marland Kitchen omeprazole (PRILOSEC) 20 MG capsule Take 1 capsule (20 mg total) by mouth daily. 90 capsule 1  . polyethylene glycol (MIRALAX / GLYCOLAX) packet Take 17 g by mouth daily. 14 each 0  . ranitidine (ZANTAC) 300 MG tablet Take 1 tablet (300 mg total) by mouth at bedtime. 90 tablet 1  . tiZANidine (ZANAFLEX) 2 MG tablet Take 0.5-1 tablets (1-2 mg total) by mouth at bedtime as needed for muscle spasms. 30 tablet 0  . Vitamin D, Ergocalciferol, (DRISDOL) 50000 units CAPS capsule Take 1 capsule (50,000 Units total) by mouth every 7 (seven) days. 4 capsule 0   No current facility-administered medications on file prior to visit.     PAST MEDICAL HISTORY: Past Medical History:  Diagnosis Date  . Arthritis   . Atrophic rhinitis 04/21/2016  . Constipation   . Decreased hearing   . Diverticulitis   . Dry eyes   . Fatigue   . Floaters in visual field   . Gall bladder disease   . GERD (gastroesophageal reflux disease)   . Glaucoma   . Hiatal hernia with gastroesophageal reflux   . History of blood transfusion    for Knee replacement  . Hyperlipidemia, mild 04/02/2016  . Hypertension   . Hypothyroidism   . Joint pain   . Migraine aura without headache   .  Muscle pain   . Muscle stiffness   . Nasal congestion   . Obesity 04/02/2016  . Osteoarthritis   . Parotiditis 04/21/2016  . Vitamin D deficiency 04/02/2016    PAST SURGICAL HISTORY: Past Surgical History:  Procedure Laterality Date  . APPENDECTOMY    . cataract surgery  2008  . CHOLECYSTECTOMY  2010  . JOINT REPLACEMENT Left    2012  . MASTOIDECTOMY    . POSTERIOR CERVICAL FUSION/FORAMINOTOMY N/A 04/27/2017   Procedure: CERVICAL FIVE-SIX  OPEN REDUCTION OF FRACTURE, CERVICAL THREE-SEVEN  DORSAL FIXATION AND FUSION;  Surgeon: Ditty, Kevan Ny, MD;  Location: [facility];  Service: Neurosurgery;  Laterality: N/A;  . SHOULDER  ARTHROSCOPY Bilateral prior to 2010  . TONSILLECTOMY    . TOTAL HIP ARTHROPLASTY Right 03/22/2015   Procedure: RIGHT TOTAL HIP ARTHROPLASTY ANTERIOR APPROACH;  Surgeon: Dorna Leitz, MD;  Location: [facility];  Service: Orthopedics;  Laterality: Right;  . TOTAL KNEE ARTHROPLASTY Bilateral 2007  . TUBAL LIGATION      SOCIAL HISTORY: Social History   Tobacco Use  . Smoking status: Former Research scientist (life sciences)  . Smokeless tobacco: Never Used  . Tobacco comment: Stopped 50 years ago.   Substance Use Topics  . Alcohol use: Yes    Alcohol/week: 0.0 standard drinks    Comment: socially  . Drug use: No    FAMILY HISTORY: Family History  Problem Relation Age of Onset  . Dementia Mother   . Hypertension Mother   . Obesity Mother   . Heart disease Father   . Hypertension Father   . Stroke Father   . Diabetes Father   . Hyperlipidemia Father   . Alcoholism Father   . Obesity Father   . Arthritis Daughter   . Cancer Maternal Grandfather        colon cancer    ROS: Review of Systems  All other systems reviewed and are negative.   PHYSICAL EXAM: Blood pressure (!) 121/7, pulse 60, temperature (!) 97.4 F (36.3 C), temperature source Oral, height 5\' 7"  (1.702 m), weight 204 lb (92.5 kg), SpO2 94 %. Body mass index is 31.95 kg/m. Physical Exam  Constitutional: She is oriented to person, place, and time. She appears well-developed and well-nourished.  HENT:  Head: Normocephalic and atraumatic.  Eyes: Pupils are equal, round, and reactive to light.  Neck: Normal range of motion.  Pulmonary/Chest: Effort normal.  Musculoskeletal: Normal range of motion.  Neurological: She is alert and oriented to person, place, and time.  Skin: Skin is warm and dry.  Psychiatric: She has a normal mood and affect. Her behavior is normal.  Vitals reviewed.   RECENT LABS AND TESTS: BMET    Component Value Date/Time   NA 136 01/28/2018 1030   K 4.4 01/28/2018 1030   CL 100 01/28/2018 1030   CO2 27 01/28/2018  1030   GLUCOSE 113 (H) 01/28/2018 1030   BUN 13 01/28/2018 1030   CREATININE 0.67 01/28/2018 1030   CALCIUM 9.6 01/28/2018 1030   GFRNONAA >60 05/15/2017 0524   GFRAA >60 05/15/2017 0524   Lab Results  Component Value Date   HGBA1C 6.2 (H) 05/30/2018   HGBA1C 6.1 01/28/2018   HGBA1C 6.4 10/18/2017   Lab Results  Component Value Date   INSULIN 15.5 05/30/2018   CBC    Component Value Date/Time   WBC 5.4 01/28/2018 1030   RBC 4.50 01/28/2018 1030   HGB 14.5 01/28/2018 1030   HCT 43.0 01/28/2018 1030   PLT 217.0  01/28/2018 1030   MCV 95.6 01/28/2018 1030   MCH 30.6 05/15/2017 0524   MCHC 33.8 01/28/2018 1030   RDW 13.1 01/28/2018 1030   LYMPHSABS 1.4 05/15/2017 0524   MONOABS 0.6 05/15/2017 0524   EOSABS 0.3 05/15/2017 0524   BASOSABS 0.0 05/15/2017 0524   Iron/TIBC/Ferritin/ %Sat No results found for: IRON, TIBC, FERRITIN, IRONPCTSAT Lipid Panel     Component Value Date/Time   CHOL 230 (H) 01/28/2018 1030   TRIG 212.0 (H) 01/28/2018 1030   HDL 59.90 01/28/2018 1030   CHOLHDL 4 01/28/2018 1030   VLDL 42.4 (H) 01/28/2018 1030   LDLCALC 102 (H) 10/18/2017 1131   LDLDIRECT 144.0 01/28/2018 1030   Hepatic Function Panel     Component Value Date/Time   PROT 7.2 01/28/2018 1030   ALBUMIN 4.5 01/28/2018 1030   AST 17 01/28/2018 1030   ALT 14 01/28/2018 1030   ALKPHOS 86 01/28/2018 1030   BILITOT 0.4 01/28/2018 1030      Component Value Date/Time   TSH 3.62 01/28/2018 1030   TSH 2.46 10/18/2017 1131   TSH 1.04 06/14/2017 1228    ASSESSMENT AND PLAN: Vitamin D deficiency  Prediabetes  Class 1 obesity with serious comorbidity and body mass index (BMI) of 32.0 to 32.9 in adult, unspecified obesity type  PLAN: Vitamin D Deficiency Dolorez was informed that low vitamin D levels contributes to fatigue and are associated with obesity, breast, and colon cancer. She agrees to continue to take prescription Vit D @50 ,000 IU every week and will follow up for routine  testing of vitamin D, at least 2-3 times per year. She was informed of the risk of over-replacement of vitamin D and agrees to not increase her dose unless she discusses this with [place] first.  Pre-Diabetes Kyah will continue to work on weight loss, exercise, and decreasing simple carbohydrates in her diet to help decrease the risk of diabetes. We dicussed metformin including benefits and risks. She was informed that eating too many simple carbohydrates or too many calories at one sitting increases the likelihood of GI side effects. Cynthis declined metformin for now and a prescription was not written today. Genora agreed to follow up with [place] as directed to monitor her progress. Will decrease her carbohydrates and increase her protein.   Obesity Leira is currently in the action stage of change. As such, her goal is to continue with weight loss efforts She has agreed to follow the Category 2 plan Wilmary has been instructed to work up to a goal of 150 minutes of combined cardio and strengthening exercise per week for weight loss and overall health benefits. Will get back on track. Will try the low calorie wraps.  We discussed the following Behavioral Modification Stratagies today: increasing lean protein intake, decreasing simple carbohydrates , increasing vegetables and work on meal planning and easy cooking plans, no skipping meals, keeping healthy foods in the home and increase H20 intake.     Kelee has agreed to follow up with our clinic in 2 weeks. She was informed of the importance of frequent follow up visits to maximize her success with intensive lifestyle modifications for her multiple health conditions.   OBESITY BEHAVIORAL INTERVENTION VISIT  Today's visit was # 3   Starting weight: 210 lbs Starting date: 05/30/18 Today's weight : Weight: 204 lb (92.5 kg)  Today's date: 07/12/2018 Total lbs lost to date: 6 At least 15 minutes were spent on discussing the following behavioral  interventions:     ASK:  We discussed the diagnosis of obesity with [name] today and Agness agreed to give [place] permission to discuss obesity behavioral modification therapy today.  ASSESS: Nyajah has the diagnosis of obesity and her BMI today is 32.0 Sheridan is in the action stage of change   ADVISE: Tamieka was educated on the multiple health risks of obesity as well as the benefit of weight loss to improve her health. She was advised of the need for long term treatment and the importance of lifestyle modifications to improve her current health and to decrease her risk of future health problems.  AGREE: Multiple dietary modification options and treatment options were discussed and  Anett agreed to follow the recommendations documented in the above note.  ARRANGE: Ayanah was educated on the importance of frequent visits to treat obesity as outlined per CMS and USPSTF guidelines and agreed to schedule her next follow up appointment today.  I, April Moore, am acting as transcriptionist for Dr Jearld Lesch.

## 2018-07-18 ENCOUNTER — Ambulatory Visit: Payer: Medicare Other | Attending: Family Medicine | Admitting: Physical Therapy

## 2018-07-18 ENCOUNTER — Encounter: Payer: Self-pay | Admitting: Physical Therapy

## 2018-07-18 DIAGNOSIS — M542 Cervicalgia: Secondary | ICD-10-CM | POA: Insufficient documentation

## 2018-07-18 DIAGNOSIS — M6281 Muscle weakness (generalized): Secondary | ICD-10-CM | POA: Diagnosis not present

## 2018-07-18 DIAGNOSIS — M546 Pain in thoracic spine: Secondary | ICD-10-CM | POA: Diagnosis not present

## 2018-07-18 DIAGNOSIS — M25511 Pain in right shoulder: Secondary | ICD-10-CM | POA: Diagnosis not present

## 2018-07-18 NOTE — Therapy (Addendum)
[facility] [address]  [place], [place], [address] Phone: [phone]   Fax:  [phone]  Physical Therapy Treatment  Patient Details  Name: Catherine Coleman MRN: [id number] Date of Birth: Nov 21, 1931 Referring Provider (PT): Penni Homans, MD   Progress Note Reporting Period 06/20/18 to 07/18/18  See note below for Objective Data and Assessment of Progress/Goals.    Encounter Date: 07/18/2018  PT End of Session - 07/18/18 1210    Visit Number  3    Number of Visits  8    Date for PT Re-Evaluation  08/01/18    Authorization Type  MCR/BCBS    PT Start Time  0926    PT Stop Time  1015    PT Time Calculation (min)  49 min    Activity Tolerance  Patient tolerated treatment well    Behavior During Therapy  [facility] for tasks assessed/performed       Past Medical History:  Diagnosis Date  . Arthritis   . Atrophic rhinitis 04/21/2016  . Constipation   . Decreased hearing   . Diverticulitis   . Dry eyes   . Fatigue   . Floaters in visual field   . Gall bladder disease   . GERD (gastroesophageal reflux disease)   . Glaucoma   . Hiatal hernia with gastroesophageal reflux   . History of blood transfusion    for Knee replacement  . Hyperlipidemia, mild 04/02/2016  . Hypertension   . Hypothyroidism   . Joint pain   . Migraine aura without headache   . Muscle pain   . Muscle stiffness   . Nasal congestion   . Obesity 04/02/2016  . Osteoarthritis   . Parotiditis 04/21/2016  . Vitamin D deficiency 04/02/2016    Past Surgical History:  Procedure Laterality Date  . APPENDECTOMY    . cataract surgery  2008  . CHOLECYSTECTOMY  2010  . JOINT REPLACEMENT Left    2012  . MASTOIDECTOMY    . POSTERIOR CERVICAL FUSION/FORAMINOTOMY N/A 04/27/2017   Procedure: CERVICAL FIVE-SIX  OPEN REDUCTION OF FRACTURE, CERVICAL THREE-SEVEN  DORSAL FIXATION AND FUSION;  Surgeon: Ditty, Kevan Ny, MD;  Location: [place];  Service:  Neurosurgery;  Laterality: N/A;  . SHOULDER ARTHROSCOPY Bilateral prior to 2010  . TONSILLECTOMY    . TOTAL HIP ARTHROPLASTY Right 03/22/2015   Procedure: RIGHT TOTAL HIP ARTHROPLASTY ANTERIOR APPROACH;  Surgeon: Dorna Leitz, MD;  Location: [place];  Service: Orthopedics;  Laterality: Right;  . TOTAL KNEE ARTHROPLASTY Bilateral 2007  . TUBAL LIGATION      There were no vitals filed for this visit.  Subjective Assessment - 07/18/18 0924    Subjective  Reports the R sided painful trigger point went away after massage last session. Went to a workshop that made it return and has not been doing exercises since d/t falre up of L sided trigger point. Also had a flare up of neck pain and stiffness.     Pertinent History  PMH: cerv fusion 2018, Rt TSA,  bilat TKA 2007, Rt THA 2016,HTN,obesity, vit D deficency,pre-DM,GERD,    Diagnostic tests  ecent neck x-ray: Posterior cervical fusion at C4-C7. Prominent facet disease at C3-C4. Bony encroachment of the rightforamen at C3-C4 and left foramen C6-7    Patient Stated Goals  get this pain to go away, find out what exercises I can do    Currently in Pain?  Yes    Pain Score  3  Pain Location  Thoracic    Pain Orientation  Left    Pain Descriptors / Indicators  Constant    Pain Type  Acute pain                       [facility] Adult PT Treatment/Exercise - 07/18/18 0001      Self-Care   Self-Care  Other Self-Care Comments   practice and edu on self-STM ball on wall to L thoracic para     Exercises   Exercises  Shoulder;Lumbar;Neck      Neck Exercises: Seated   Neck Retraction  Limitations;15 reps    Neck Retraction Limitations  2x15; TC and VCs to avoid excessive cervical flexion    Other Seated Exercise  B rhomboid stretch 2x15"   correction of form     Lumbar Exercises: Sidelying   Other Sidelying Lumbar Exercises  open book stretch B sides 10x3"   to tolerance; sensation of stretch in R pec on R side     Shoulder Exercises:  Seated   Retraction  Strengthening;Both;15 reps;Limitations    Retraction Limitations  15x3" good form    Row  Strengthening;Both;10 reps;Theraband;Limitations    Theraband Level (Shoulder Row)  Level 2 (Red)    Row Limitations  manual cues to guide scapulae into retraction rather than elevation      Shoulder Exercises: Pulleys   Flexion  3 minutes    Scaption  3 minutes      Manual Therapy   Manual Therapy  Soft tissue mobilization;Myofascial release    Manual therapy comments  prone     Soft tissue mobilization  STM to L thoracic paraspinals, rhomboid, suboccipitals, cervical paraspinals- taught band of muscle in L thoracic paraspinals    Myofascial Release  manual TPR to L thoracic paraspinals      Neck Exercises: Stretches   Upper Trapezius Stretch  Right;Left;30 seconds;2 reps    Upper Trapezius Stretch Limitations  2nd set with strap; to tolerance    Levator Stretch  Right;Left;2 reps;30 seconds;Limitations    Levator Stretch Limitations  with strap to tolerance- c/o L suboccipital pain and discontinued             PT Education - 07/18/18 1022    Education Details  self STM with ball, update to HEP and advised to discontinue LS stretch, administered red TB    Person(s) Educated  Patient    Methods  Explanation;Demonstration;Tactile cues;Verbal cues;Handout    Comprehension  Verbalized understanding;Returned demonstration          PT Long Term Goals - 07/01/18 0923      PT LONG TERM GOAL #1   Title  Pt will be I and compliant with HEP. 6 weeks 08/01/18    Status  On-going      PT LONG TERM GOAL #2   Title  Pt will improve Rt shoulder strength to at least 4+/5 MMT to improve function. 6 weeks 08/01/18    Status  On-going      PT LONG TERM GOAL #3   Title  Pt will report no more than 2/10 overall pain with usual activity. 6 weeks 08/01/18    Status  On-going      PT LONG TERM GOAL #4   Title  Pt will improve neck side bend and rotation 5 deg each way to  decrease neck stiffness and increase her ability to scan her enviornment. 6 weeks 08/01/18    Status  On-going  Plan - 07/18/18 1210    Clinical Impression Statement  Patient reports resolution of R sided rhomboid/thoracic trigger point after last session, however, return of symptoms on L side after going to a workshop in [place]. Stopped HEP since the return of symptoms. Patient reported good benefit from [facility] to L thoracic paraspinals, rhomboid, suboccipitals, cervical paraspinals. Considerable taut band of muscle along L thoracic paraspinals today.  Reported R pec stretch with R open book stretch but no pain. Patient reporting "I have been doing these exercises all wrong" after review of HEP. Patient had L sided suboccipital pain with LS stretching which was discontinue. Also advised patient to discontinue this exercise at home. Pain was relieved after performing cervical retractions. Good understanding of scapular row with banded resistance- updated HEP to include this exercise. No c/o pain at end of session.     PT Treatment/Interventions  ADLs/Self Care Home Management;Cryotherapy;Electrical Stimulation;Iontophoresis 44m/ml Dexamethasone;Moist Heat;Ultrasound    Consulted and Agree with Plan of Care  Patient       Patient will benefit from skilled therapeutic intervention in order to improve the following deficits and impairments:  Decreased activity tolerance, Decreased endurance, Decreased range of motion, Decreased strength, Hypomobility, Impaired flexibility, Pain  Visit Diagnosis: Pain in thoracic spine  Cervicalgia  Acute pain of right shoulder  Muscle weakness (generalized)     Problem List Patient Active Problem List   Diagnosis Date Noted  . Prediabetes 06/14/2018  . Low back pain 06/12/2018  . Hyperglycemia 01/28/2018  . Recurrent sinusitis 10/24/2017  . Urinary incontinence 10/18/2017  . Physical deconditioning 10/18/2017  . History of cervical  fracture 06/09/2017  . Hypoxia 05/14/2017  . Sternal fracture 04/30/2017  . Parotiditis 04/21/2016  . Breast cancer screening 04/21/2016  . Atrophic rhinitis 04/21/2016  . Obesity 04/02/2016  . Diverticulosis of colon without hemorrhage 04/02/2016  . Vitamin D deficiency 04/02/2016  . Hyperlipidemia, mild 04/02/2016  . Hypertension   . Arthritis   . Hiatal hernia with gastroesophageal reflux   . Glaucoma   . Hypothyroidism   . Primary osteoarthritis of right hip 03/22/2015    YJanene Harvey PT, DPT 07/18/18 12:16 PM   [facility] [address] [place] [address]Phone: [phone]  Fax:  [phone] Name: BMarvalene BarrettMRN: [id number] of Birth: 501/29/1933 PHYSICAL THERAPY DISCHARGE SUMMARY  Visits from Start of Care: 3  Current functional level related to goals / functional outcomes: Unable to assess; patient did not return since last session because she was doing well   Remaining deficits: Unable to assess   Education / Equipment: HEP  Plan: Patient agrees to discharge.  Patient goals were not met. Patient is being discharged due to being pleased with the current functional level.  ?????     YJanene Harvey PT, DPT 08/01/18 10:08 AM

## 2018-07-19 DIAGNOSIS — H02201 Unspecified lagophthalmos right upper eyelid: Secondary | ICD-10-CM | POA: Diagnosis not present

## 2018-07-19 DIAGNOSIS — H02202 Unspecified lagophthalmos right lower eyelid: Secondary | ICD-10-CM | POA: Diagnosis not present

## 2018-07-19 DIAGNOSIS — H02204 Unspecified lagophthalmos left upper eyelid: Secondary | ICD-10-CM | POA: Diagnosis not present

## 2018-07-19 DIAGNOSIS — H353131 Nonexudative age-related macular degeneration, bilateral, early dry stage: Secondary | ICD-10-CM | POA: Diagnosis not present

## 2018-07-25 ENCOUNTER — Ambulatory Visit: Payer: Medicare Other | Admitting: Physical Therapy

## 2018-07-26 ENCOUNTER — Ambulatory Visit (INDEPENDENT_AMBULATORY_CARE_PROVIDER_SITE_OTHER): Payer: Self-pay | Admitting: Bariatrics

## 2018-07-26 ENCOUNTER — Ambulatory Visit (INDEPENDENT_AMBULATORY_CARE_PROVIDER_SITE_OTHER): Payer: Medicare Other | Admitting: Bariatrics

## 2018-07-27 ENCOUNTER — Encounter (INDEPENDENT_AMBULATORY_CARE_PROVIDER_SITE_OTHER): Payer: Self-pay | Admitting: Bariatrics

## 2018-07-27 ENCOUNTER — Ambulatory Visit (INDEPENDENT_AMBULATORY_CARE_PROVIDER_SITE_OTHER): Payer: Medicare Other | Admitting: Bariatrics

## 2018-07-27 VITALS — BP 125/82 | HR 58 | Temp 97.6°F | Ht 67.0 in | Wt 202.0 lb

## 2018-07-27 DIAGNOSIS — E669 Obesity, unspecified: Secondary | ICD-10-CM | POA: Diagnosis not present

## 2018-07-27 DIAGNOSIS — Z6831 Body mass index (BMI) 31.0-31.9, adult: Secondary | ICD-10-CM

## 2018-07-27 DIAGNOSIS — E559 Vitamin D deficiency, unspecified: Secondary | ICD-10-CM

## 2018-07-27 DIAGNOSIS — R7303 Prediabetes: Secondary | ICD-10-CM | POA: Diagnosis not present

## 2018-07-27 MED ORDER — VITAMIN D (ERGOCALCIFEROL) 1.25 MG (50000 UNIT) PO CAPS: 50000.0000 [IU] | ORAL_CAPSULE | ORAL | 0 refills | Status: DC

## 2018-08-01 NOTE — Progress Notes (Signed)
Office: [phone]  /  Fax: [phone]   HPI:   Chief Complaint: OBESITY Catherine Coleman is here to discuss her progress with her obesity treatment plan. She is on the  follow the Category 2 plan and is following her eating plan approximately 90-95 % of the time. She states she is exercising 0 minutes 0 times per week. [place] is doing well with the plan. She denies any struggles at this time.  Her weight is 202 lb (91.6 kg) today and has had a weight loss of 2 pounds over a period of 2 weeks since her last visit. She has lost 8 lbs since starting treatment with [place].  Vitamin D deficiency Catherine Coleman has a diagnosis of vitamin D deficiency. She is currently taking vit D and denies nausea, vomiting or muscle weakness.  Ref. Range 05/30/2018 10:31  Vitamin D, 25-Hydroxy Latest Ref Range: 30.0 - 100.0 ng/mL 38.2   Pre-Diabetes Catherine Coleman has a diagnosis of prediabetes based on her elevated HgA1c of 6.2 and fasting Insulin level of 15.5 and was informed this puts her at greater risk of developing diabetes. She is not taking metformin currently and continues to work on diet and exercise to decrease risk of diabetes. She denies nausea or hypoglycemia.   ALLERGIES: Allergies  Allergen Reactions  . Other Other (See Comments)    Seasonal allergies(pollen & grass)    MEDICATIONS: Current Outpatient Medications on File Prior to Visit  Medication Sig Dispense Refill  . brimonidine (ALPHAGAN) 0.2 % ophthalmic solution Place 1 drop into both eyes 2 (two) times daily. 5 mL 12  . Cholecalciferol (VITAMIN D) 2000 units CAPS Take 1 capsule (2,000 Units total) by mouth daily. 30 capsule 5  . fluticasone (FLONASE) 50 MCG/ACT nasal spray Place 2 sprays into both nostrils daily. 16 g 6  . levocetirizine (XYZAL) 5 MG tablet Take 1 tablet (5 mg total) by mouth every evening. 30 tablet 5  . levothyroxine (SYNTHROID, LEVOTHROID) 100 MCG tablet TAKE ONE (1) TABLET BY MOUTH EVERY DAY BEFORE BREAKFAST 90 tablet 1  .  losartan (COZAAR) 50 MG tablet TAKE ONE (1) TABLET BY MOUTH EVERY DAY 90 tablet 1  . Multiple Vitamins-Minerals (MULTIVITAMIN WITH MINERALS) tablet Take 1 tablet by mouth daily.    Marland Kitchen omeprazole (PRILOSEC) 20 MG capsule Take 1 capsule (20 mg total) by mouth daily. 90 capsule 1  . polyethylene glycol (MIRALAX / GLYCOLAX) packet Take 17 g by mouth daily. 14 each 0  . ranitidine (ZANTAC) 300 MG tablet Take 1 tablet (300 mg total) by mouth at bedtime. 90 tablet 1   No current facility-administered medications on file prior to visit.     PAST MEDICAL HISTORY: Past Medical History:  Diagnosis Date  . Arthritis   . Atrophic rhinitis 04/21/2016  . Constipation   . Decreased hearing   . Diverticulitis   . Dry eyes   . Fatigue   . Floaters in visual field   . Gall bladder disease   . GERD (gastroesophageal reflux disease)   . Glaucoma   . Hiatal hernia with gastroesophageal reflux   . History of blood transfusion    for Knee replacement  . Hyperlipidemia, mild 04/02/2016  . Hypertension   . Hypothyroidism   . Joint pain   . Migraine aura without headache   . Muscle pain   . Muscle stiffness   . Nasal congestion   . Obesity 04/02/2016  . Osteoarthritis   . Parotiditis 04/21/2016  . Vitamin D deficiency 04/02/2016  PAST SURGICAL HISTORY: Past Surgical History:  Procedure Laterality Date  . APPENDECTOMY    . cataract surgery  2008  . CHOLECYSTECTOMY  2010  . JOINT REPLACEMENT Left    2012  . MASTOIDECTOMY    . POSTERIOR CERVICAL FUSION/FORAMINOTOMY N/A 04/27/2017   Procedure: CERVICAL FIVE-SIX  OPEN REDUCTION OF FRACTURE, CERVICAL THREE-SEVEN  DORSAL FIXATION AND FUSION;  Surgeon: Ditty, Kevan Ny, MD;  Location: [place];  Service: Neurosurgery;  Laterality: N/A;  . SHOULDER ARTHROSCOPY Bilateral prior to 2010  . TONSILLECTOMY    . TOTAL HIP ARTHROPLASTY Right 03/22/2015   Procedure: RIGHT TOTAL HIP ARTHROPLASTY ANTERIOR APPROACH;  Surgeon: Dorna Leitz, MD;  Location: [place];   Service: Orthopedics;  Laterality: Right;  . TOTAL KNEE ARTHROPLASTY Bilateral 2007  . TUBAL LIGATION      SOCIAL HISTORY: Social History   Tobacco Use  . Smoking status: Former Research scientist (life sciences)  . Smokeless tobacco: Never Used  . Tobacco comment: Stopped 50 years ago.   Substance Use Topics  . Alcohol use: Yes    Alcohol/week: 0.0 standard drinks    Comment: socially  . Drug use: No    FAMILY HISTORY: Family History  Problem Relation Age of Onset  . Dementia Mother   . Hypertension Mother   . Obesity Mother   . Heart disease Father   . Hypertension Father   . Stroke Father   . Diabetes Father   . Hyperlipidemia Father   . Alcoholism Father   . Obesity Father   . Arthritis Daughter   . Cancer Maternal Grandfather        colon cancer    ROS: Review of Systems  Constitutional: Positive for weight loss.  Gastrointestinal: Negative for nausea and vomiting.  Musculoskeletal:       Negative for muscle weakness  Endo/Heme/Allergies:       Negative for hypoglycemia    PHYSICAL EXAM: Blood pressure 125/82, pulse (!) 58, temperature 97.6 F (36.4 C), temperature source Oral, height 5\' 7"  (1.702 m), weight 202 lb (91.6 kg), SpO2 96 %. Body mass index is 31.64 kg/m. Physical Exam  Constitutional: She is oriented to person, place, and time. She appears well-developed and well-nourished.  HENT:  Head: Normocephalic.  Neck: Normal range of motion.  Cardiovascular: Normal rate.  Pulmonary/Chest: Effort normal.  Musculoskeletal: Normal range of motion.  Neurological: She is alert and oriented to person, place, and time.  Skin: Skin is warm and dry.  Psychiatric: She has a normal mood and affect. Her behavior is normal.  Vitals reviewed.   RECENT LABS AND TESTS: BMET    Component Value Date/Time   NA 136 01/28/2018 1030   K 4.4 01/28/2018 1030   CL 100 01/28/2018 1030   CO2 27 01/28/2018 1030   GLUCOSE 113 (H) 01/28/2018 1030   BUN 13 01/28/2018 1030   CREATININE 0.67  01/28/2018 1030   CALCIUM 9.6 01/28/2018 1030   GFRNONAA >60 05/15/2017 0524   GFRAA >60 05/15/2017 0524   Lab Results  Component Value Date   HGBA1C 6.2 (H) 05/30/2018   HGBA1C 6.1 01/28/2018   HGBA1C 6.4 10/18/2017   Lab Results  Component Value Date   INSULIN 15.5 05/30/2018   CBC    Component Value Date/Time   WBC 5.4 01/28/2018 1030   RBC 4.50 01/28/2018 1030   HGB 14.5 01/28/2018 1030   HCT 43.0 01/28/2018 1030   PLT 217.0 01/28/2018 1030   MCV 95.6 01/28/2018 1030   MCH 30.6 05/15/2017 0524  MCHC 33.8 01/28/2018 1030   RDW 13.1 01/28/2018 1030   LYMPHSABS 1.4 05/15/2017 0524   MONOABS 0.6 05/15/2017 0524   EOSABS 0.3 05/15/2017 0524   BASOSABS 0.0 05/15/2017 0524   Iron/TIBC/Ferritin/ %Sat No results found for: IRON, TIBC, FERRITIN, IRONPCTSAT Lipid Panel     Component Value Date/Time   CHOL 230 (H) 01/28/2018 1030   TRIG 212.0 (H) 01/28/2018 1030   HDL 59.90 01/28/2018 1030   CHOLHDL 4 01/28/2018 1030   VLDL 42.4 (H) 01/28/2018 1030   LDLCALC 102 (H) 10/18/2017 1131   LDLDIRECT 144.0 01/28/2018 1030   Hepatic Function Panel     Component Value Date/Time   PROT 7.2 01/28/2018 1030   ALBUMIN 4.5 01/28/2018 1030   AST 17 01/28/2018 1030   ALT 14 01/28/2018 1030   ALKPHOS 86 01/28/2018 1030   BILITOT 0.4 01/28/2018 1030      Component Value Date/Time   TSH 3.62 01/28/2018 1030   TSH 2.46 10/18/2017 1131   TSH 1.04 06/14/2017 1228    Ref. Range 05/30/2018 10:31  Vitamin D, 25-Hydroxy Latest Ref Range: 30.0 - 100.0 ng/mL 38.2    ASSESSMENT AND PLAN: Vitamin D deficiency - Plan: Vitamin D, Ergocalciferol, (DRISDOL) 1.25 MG (50000 UT) CAPS capsule  Prediabetes  Class 1 obesity with serious comorbidity and body mass index (BMI) of 31.0 to 31.9 in adult, unspecified obesity type  PLAN: Vitamin D Deficiency Catherine Coleman was informed that low vitamin D levels contributes to fatigue and are associated with obesity, breast, and colon cancer. She agrees  to continue to take prescription Vit D @50 ,000 IU every week #4 with no refills and will follow up for routine testing of vitamin D, at least 2-3 times per year. She was informed of the risk of over-replacement of vitamin D and agrees to not increase her dose unless she discusses this with [place] first. Agrees to follow up with our clinic as directed.    Pre-Diabetes Catherine Coleman will continue to work on weight loss, exercise, and decreasing simple carbohydrates in her diet to help decrease the risk of diabetes. We dicussed metformin including benefits and risks. She was informed that eating too many simple carbohydrates or too many calories at one sitting increases the likelihood of GI side effects. She agrees to decrease carbohydrates and increase protein. Catherine Coleman agreed to follow up with [place] as directed to monitor her progress.  Obesity Catherine Coleman is currently in the action stage of change. As such, her goal is to continue with weight loss efforts She has agreed to follow the Category 2 plan  Handouts on eating our and Thanksgiving given to her today.  Catherine Coleman has been instructed to work up to a goal of 150 minutes of combined cardio and strengthening exercise per week for weight loss and overall health benefits. We discussed the following Behavioral Modification Strategies today: increasing lean protein intake, decreasing simple carbohydrates , increasing vegetables, decrease eating out, increasing water intake, no skipping meals, and work on meal planning and easy cooking plans   Catherine Coleman has agreed to follow up with our clinic in 3 weeks. She was informed of the importance of frequent follow up visits to maximize her success with intensive lifestyle modifications for her multiple health conditions.   OBESITY BEHAVIORAL INTERVENTION VISIT  Today's visit was # 5   Starting weight: 210 lb Starting date: 05/30/18 Today's weight : Weight: 202 lb (91.6 kg)  Today's date: 07/27/18 Total lbs lost to date: 8  lb At least 15 minutes were  spent on discussing the following behavioral intervention visit.   ASK: We discussed the diagnosis of obesity with [name] today and Kashina agreed to give [place] permission to discuss obesity behavioral modification therapy today.  ASSESS: Rebbeca has the diagnosis of obesity and her BMI today is 31.63 Catherine Coleman is in the action stage of change   ADVISE: Loanne was educated on the multiple health risks of obesity as well as the benefit of weight loss to improve her health. She was advised of the need for long term treatment and the importance of lifestyle modifications to improve her current health and to decrease her risk of future health problems.  AGREE: Multiple dietary modification options and treatment options were discussed and  Phung agreed to follow the recommendations documented in the above note.  ARRANGE: Camia was educated on the importance of frequent visits to treat obesity as outlined per CMS and USPSTF guidelines and agreed to schedule her next follow up appointment today.  Leary Roca, am acting as transcriptionist for [employer], DO   I have reviewed the above documentation for accuracy and completeness, and I agree with the above. -Jearld Lesch, DO

## 2018-08-17 ENCOUNTER — Ambulatory Visit (INDEPENDENT_AMBULATORY_CARE_PROVIDER_SITE_OTHER): Payer: Medicare Other | Admitting: Bariatrics

## 2018-08-25 ENCOUNTER — Ambulatory Visit (INDEPENDENT_AMBULATORY_CARE_PROVIDER_SITE_OTHER): Payer: Medicare Other | Admitting: Bariatrics

## 2018-08-25 ENCOUNTER — Encounter (INDEPENDENT_AMBULATORY_CARE_PROVIDER_SITE_OTHER): Payer: Self-pay | Admitting: Bariatrics

## 2018-08-25 VITALS — BP 150/75 | HR 61 | Temp 97.6°F | Ht 67.0 in | Wt 202.0 lb

## 2018-08-25 DIAGNOSIS — E559 Vitamin D deficiency, unspecified: Secondary | ICD-10-CM

## 2018-08-25 DIAGNOSIS — Z6831 Body mass index (BMI) 31.0-31.9, adult: Secondary | ICD-10-CM

## 2018-08-25 DIAGNOSIS — I10 Essential (primary) hypertension: Secondary | ICD-10-CM | POA: Diagnosis not present

## 2018-08-25 DIAGNOSIS — E669 Obesity, unspecified: Secondary | ICD-10-CM | POA: Diagnosis not present

## 2018-08-25 DIAGNOSIS — Z789 Other specified health status: Secondary | ICD-10-CM | POA: Insufficient documentation

## 2018-08-25 MED ORDER — VITAMIN D (ERGOCALCIFEROL) 1.25 MG (50000 UNIT) PO CAPS: 50000.0000 [IU] | ORAL_CAPSULE | ORAL | 0 refills | Status: DC

## 2018-08-25 NOTE — Progress Notes (Signed)
Office: [phone]  /  Fax: [phone]   HPI:   Chief Complaint: OBESITY Catherine Coleman is here to discuss her progress with her obesity treatment plan. She is on the Category 2 plan and is following her eating plan approximately 25 % of the time. She states she is water walking 60 minutes 2 times per week. Catherine Coleman states that she has struggled, but she is surprised that she did not gain weight. She has increased her desserts and wine. Her weight is   today and she has maintained weight over a period of 4 weeks since her last visit. She has lost 8 lbs since starting treatment with [place].  Vitamin D deficiency Catherine Coleman has a diagnosis of vitamin D deficiency. She is currently taking high dose vit D and denies nausea, vomiting or muscle weakness.  Hypertension Catherine Coleman is a 84 y.o. female with hypertension. Her systolic blood pressure is elevated today at 150/75 [name] denies chest pain or headaches. Jakita is taking Losartan. She is working weight loss to help control her blood pressure with the goal of decreasing her risk of heart attack and stroke. Barbaras blood pressure is not currently controlled.  ALLERGIES: Allergies  Allergen Reactions  . Other Other (See Comments)    Seasonal allergies(pollen & grass)    MEDICATIONS: Current Outpatient Medications on File Prior to Visit  Medication Sig Dispense Refill  . brimonidine (ALPHAGAN) 0.2 % ophthalmic solution Place 1 drop into both eyes 2 (two) times daily. 5 mL 12  . Cholecalciferol (VITAMIN D) 2000 units CAPS Take 1 capsule (2,000 Units total) by mouth daily. 30 capsule 5  . fluticasone (FLONASE) 50 MCG/ACT nasal spray Place 2 sprays into both nostrils daily. 16 g 6  . levocetirizine (XYZAL) 5 MG tablet Take 1 tablet (5 mg total) by mouth every evening. 30 tablet 5  . levothyroxine (SYNTHROID, LEVOTHROID) 100 MCG tablet TAKE ONE (1) TABLET BY MOUTH EVERY DAY BEFORE BREAKFAST 90 tablet 1  . losartan (COZAAR) 50 MG tablet  TAKE ONE (1) TABLET BY MOUTH EVERY DAY 90 tablet 1  . Multiple Vitamins-Minerals (MULTIVITAMIN WITH MINERALS) tablet Take 1 tablet by mouth daily.    Marland Kitchen omeprazole (PRILOSEC) 20 MG capsule Take 1 capsule (20 mg total) by mouth daily. 90 capsule 1  . polyethylene glycol (MIRALAX / GLYCOLAX) packet Take 17 g by mouth daily. 14 each 0  . ranitidine (ZANTAC) 300 MG tablet Take 1 tablet (300 mg total) by mouth at bedtime. 90 tablet 1   No current facility-administered medications on file prior to visit.     PAST MEDICAL HISTORY: Past Medical History:  Diagnosis Date  . Arthritis   . Atrophic rhinitis 04/21/2016  . Constipation   . Decreased hearing   . Diverticulitis   . Dry eyes   . Fatigue   . Floaters in visual field   . Gall bladder disease   . GERD (gastroesophageal reflux disease)   . Glaucoma   . Hiatal hernia with gastroesophageal reflux   . History of blood transfusion    for Knee replacement  . Hyperlipidemia, mild 04/02/2016  . Hypertension   . Hypothyroidism   . Joint pain   . Migraine aura without headache   . Muscle pain   . Muscle stiffness   . Nasal congestion   . Obesity 04/02/2016  . Osteoarthritis   . Parotiditis 04/21/2016  . Vitamin D deficiency 04/02/2016    PAST SURGICAL HISTORY: Past Surgical History:  Procedure Laterality Date  .  APPENDECTOMY    . cataract surgery  2008  . CHOLECYSTECTOMY  2010  . JOINT REPLACEMENT Left    2012  . MASTOIDECTOMY    . POSTERIOR CERVICAL FUSION/FORAMINOTOMY N/A 04/27/2017   Procedure: CERVICAL FIVE-SIX  OPEN REDUCTION OF FRACTURE, CERVICAL THREE-SEVEN  DORSAL FIXATION AND FUSION;  Surgeon: Ditty, Kevan Ny, MD;  Location: [place];  Service: Neurosurgery;  Laterality: N/A;  . SHOULDER ARTHROSCOPY Bilateral prior to 2010  . TONSILLECTOMY    . TOTAL HIP ARTHROPLASTY Right 03/22/2015   Procedure: RIGHT TOTAL HIP ARTHROPLASTY ANTERIOR APPROACH;  Surgeon: Dorna Leitz, MD;  Location: [place];  Service: Orthopedics;  Laterality:  Right;  . TOTAL KNEE ARTHROPLASTY Bilateral 2007  . TUBAL LIGATION      SOCIAL HISTORY: Social History   Tobacco Use  . Smoking status: Former Research scientist (life sciences)  . Smokeless tobacco: Never Used  . Tobacco comment: Stopped 50 years ago.   Substance Use Topics  . Alcohol use: Yes    Alcohol/week: 0.0 standard drinks    Comment: socially  . Drug use: No    FAMILY HISTORY: Family History  Problem Relation Age of Onset  . Dementia Mother   . Hypertension Mother   . Obesity Mother   . Heart disease Father   . Hypertension Father   . Stroke Father   . Diabetes Father   . Hyperlipidemia Father   . Alcoholism Father   . Obesity Father   . Arthritis Daughter   . Cancer Maternal Grandfather        colon cancer    ROS: Review of Systems  Constitutional: Negative for weight loss.  Cardiovascular: Negative for chest pain.  Gastrointestinal: Negative for nausea and vomiting.  Musculoskeletal:       Negative for muscle weakness  Neurological: Negative for headaches.    PHYSICAL EXAM: Blood pressure (!) 150/75, pulse 61, temperature 97.6 F (36.4 C), temperature source Oral, SpO2 94 %. There is no height or weight on file to calculate BMI. Physical Exam Vitals signs reviewed.  Constitutional:      Appearance: Normal appearance. She is well-developed. She is obese.  Cardiovascular:     Rate and Rhythm: Normal rate.  Pulmonary:     Effort: Pulmonary effort is normal.  Musculoskeletal: Normal range of motion.  Skin:    General: Skin is warm and dry.  Neurological:     Mental Status: She is alert and oriented to person, place, and time.  Psychiatric:        Mood and Affect: Mood normal.        Behavior: Behavior normal.     RECENT LABS AND TESTS: BMET    Component Value Date/Time   NA 136 01/28/2018 1030   K 4.4 01/28/2018 1030   CL 100 01/28/2018 1030   CO2 27 01/28/2018 1030   GLUCOSE 113 (H) 01/28/2018 1030   BUN 13 01/28/2018 1030   CREATININE 0.67 01/28/2018 1030     CALCIUM 9.6 01/28/2018 1030   GFRNONAA >60 05/15/2017 0524   GFRAA >60 05/15/2017 0524   Lab Results  Component Value Date   HGBA1C 6.2 (H) 05/30/2018   HGBA1C 6.1 01/28/2018   HGBA1C 6.4 10/18/2017   Lab Results  Component Value Date   INSULIN 15.5 05/30/2018   CBC    Component Value Date/Time   WBC 5.4 01/28/2018 1030   RBC 4.50 01/28/2018 1030   HGB 14.5 01/28/2018 1030   HCT 43.0 01/28/2018 1030   PLT 217.0 01/28/2018 1030  MCV 95.6 01/28/2018 1030   MCH 30.6 05/15/2017 0524   MCHC 33.8 01/28/2018 1030   RDW 13.1 01/28/2018 1030   LYMPHSABS 1.4 05/15/2017 0524   MONOABS 0.6 05/15/2017 0524   EOSABS 0.3 05/15/2017 0524   BASOSABS 0.0 05/15/2017 0524   Iron/TIBC/Ferritin/ %Sat No results found for: IRON, TIBC, FERRITIN, IRONPCTSAT Lipid Panel     Component Value Date/Time   CHOL 230 (H) 01/28/2018 1030   TRIG 212.0 (H) 01/28/2018 1030   HDL 59.90 01/28/2018 1030   CHOLHDL 4 01/28/2018 1030   VLDL 42.4 (H) 01/28/2018 1030   LDLCALC 102 (H) 10/18/2017 1131   LDLDIRECT 144.0 01/28/2018 1030   Hepatic Function Panel     Component Value Date/Time   PROT 7.2 01/28/2018 1030   ALBUMIN 4.5 01/28/2018 1030   AST 17 01/28/2018 1030   ALT 14 01/28/2018 1030   ALKPHOS 86 01/28/2018 1030   BILITOT 0.4 01/28/2018 1030      Component Value Date/Time   TSH 3.62 01/28/2018 1030   TSH 2.46 10/18/2017 1131   TSH 1.04 06/14/2017 1228    ASSESSMENT AND PLAN: Vitamin D deficiency - Plan: Vitamin D, Ergocalciferol, (DRISDOL) 1.25 MG (50000 UT) CAPS capsule  Essential hypertension  Class 1 obesity with serious comorbidity and body mass index (BMI) of 31.0 to 31.9 in adult, unspecified obesity type  PLAN:  Vitamin D Deficiency Baneen was informed that low vitamin D levels contributes to fatigue and are associated with obesity, breast, and colon cancer. She agrees to continue to take prescription Vit D @50 ,000 IU every week #4 with no refills and will follow up for  routine testing of vitamin D, at least 2-3 times per year. She was informed of the risk of over-replacement of vitamin D and agrees to not increase her dose unless she discusses this with [place] first. Lacosta agrees to follow up as directed.  Hypertension We discussed sodium restriction, working on healthy weight loss, and a regular exercise program as the means to achieve improved blood pressure control. Catherine Coleman agreed with this plan and agreed to follow up as directed. We will continue to monitor her blood pressure as well as her progress with the above lifestyle modifications. She will continue her medications as prescribed and will watch for signs of hypotension as she continues her lifestyle modifications.  Obesity Catherine Coleman is currently in the action stage of change. As such, her goal is to continue with weight loss efforts She has agreed to follow the Category 2 plan Catherine Coleman has been instructed to work up to a goal of 150 minutes of combined cardio and strengthening exercise per week for weight loss and overall health benefits. We discussed the following Behavioral Modification Strategies today: planning for success, increasing lean protein intake, decreasing simple carbohydrates, increasing vegetables, increase H2O intake, holiday eating strategies and celebration eating strategies Catherine Coleman will start writing down what she eats. She will not have cookies in the house.  Catherine Coleman has agreed to follow up with our clinic in 2 weeks fasting (labs next visit). She was informed of the importance of frequent follow up visits to maximize her success with intensive lifestyle modifications for her multiple health conditions.   OBESITY BEHAVIORAL INTERVENTION VISIT  Today's visit was # 6   Starting weight: 210 lbs Starting date: 05/30/2018 Today's weight : 202 lbs Today's date: 08/25/2018 Total lbs lost to date: 8 At least 15 minutes were spent on discussing the following behavioral intervention  visit.   ASK: We discussed the  diagnosis of obesity with [name] today and Yasenia agreed to give [place] permission to discuss obesity behavioral modification therapy today.  ASSESS: Catherine Coleman has the diagnosis of obesity and her BMI today is 31.63 Catherine Coleman is in the action stage of change   ADVISE: Catherine Coleman was educated on the multiple health risks of obesity as well as the benefit of weight loss to improve her health. She was advised of the need for long term treatment and the importance of lifestyle modifications to improve her current health and to decrease her risk of future health problems.  AGREE: Multiple dietary modification options and treatment options were discussed and  Selisa agreed to follow the recommendations documented in the above note.  ARRANGE: Catherine Coleman was educated on the importance of frequent visits to treat obesity as outlined per CMS and USPSTF guidelines and agreed to schedule her next follow up appointment today.  Corey Skains, am acting as Location manager for [employer]. Owens Shark, DO  I have reviewed the above documentation for accuracy and completeness, and I agree with the above. -Jearld Lesch, DO

## 2018-09-20 ENCOUNTER — Ambulatory Visit (INDEPENDENT_AMBULATORY_CARE_PROVIDER_SITE_OTHER): Payer: Medicare Other | Admitting: Bariatrics

## 2018-09-20 ENCOUNTER — Encounter (INDEPENDENT_AMBULATORY_CARE_PROVIDER_SITE_OTHER): Payer: Self-pay

## 2018-09-28 ENCOUNTER — Encounter (INDEPENDENT_AMBULATORY_CARE_PROVIDER_SITE_OTHER): Payer: Self-pay | Admitting: Physician Assistant

## 2018-09-28 ENCOUNTER — Ambulatory Visit (INDEPENDENT_AMBULATORY_CARE_PROVIDER_SITE_OTHER): Payer: Medicare Other | Admitting: Physician Assistant

## 2018-09-28 VITALS — BP 104/70 | HR 67 | Temp 97.8°F | Ht 67.0 in | Wt 201.0 lb

## 2018-09-28 DIAGNOSIS — E785 Hyperlipidemia, unspecified: Secondary | ICD-10-CM | POA: Diagnosis not present

## 2018-09-28 DIAGNOSIS — Z6831 Body mass index (BMI) 31.0-31.9, adult: Secondary | ICD-10-CM | POA: Diagnosis not present

## 2018-09-28 DIAGNOSIS — R7303 Prediabetes: Secondary | ICD-10-CM

## 2018-09-28 DIAGNOSIS — E669 Obesity, unspecified: Secondary | ICD-10-CM | POA: Diagnosis not present

## 2018-09-28 DIAGNOSIS — E559 Vitamin D deficiency, unspecified: Secondary | ICD-10-CM

## 2018-09-28 DIAGNOSIS — E039 Hypothyroidism, unspecified: Secondary | ICD-10-CM | POA: Diagnosis not present

## 2018-09-28 MED ORDER — VITAMIN D (ERGOCALCIFEROL) 1.25 MG (50000 UNIT) PO CAPS: 50000.0000 [IU] | ORAL_CAPSULE | ORAL | 0 refills | Status: DC

## 2018-09-28 NOTE — Progress Notes (Signed)
Office: [phone]  /  Fax: [phone]   HPI:   Chief Complaint: OBESITY Catherine Coleman is here to discuss her progress with her obesity treatment plan. She is on the Category 2 plan and is following her eating plan approximately 50 % of the time. She states she is exercising 0 minutes 0 times per week. [name] did well with weight loss. She reports that she does not want to eat pre-made or processed foods and is asking about eating all natural foods on the plan. Her weight is 201 lb (91.2 kg) today and has had a weight loss of 1 pounds over a period of 5 weeks since her last visit. She has lost 9 lbs since starting treatment with [place].  Vitamin D deficiency Catherine Coleman has a diagnosis of vitamin D deficiency. She is currently taking prescription Vit D and denies nausea, vomiting or muscle weakness.  Hyperlipidemia Catherine Coleman has hyperlipidemia and has been trying to improve her cholesterol levels with intensive lifestyle modification including a low saturated fat diet, exercise and weight loss. She is not on any medications. She denies any chest pain.  Pre-Diabetes Catherine Coleman has a diagnosis of prediabetes based on her elevated Hgb A1c and was informed this puts her at greater risk of developing diabetes. She is not taking metformin currently and continues to work on diet and exercise to decrease risk of diabetes. She denies nausea or hypoglycemia.  Hypothyroidism Catherine Coleman is a 85 y.o. female who has a diagnosis of hypothyroidism. She denies heat/cold intolerances. She is taking levothyroxine.    ASSESSMENT AND PLAN:  Vitamin D deficiency - Plan: VITAMIN D 25 Hydroxy (Vit-D Deficiency, Fractures), Vitamin D, Ergocalciferol, (DRISDOL) 1.25 MG (50000 UT) CAPS capsule  Hyperlipidemia, unspecified hyperlipidemia type - Plan: Comprehensive metabolic panel, Lipid panel  Prediabetes - Plan: Hemoglobin A1c, Insulin, random  Hypothyroidism, unspecified type  Class 1 obesity with serious  comorbidity and body mass index (BMI) of 31.0 to 31.9 in adult, unspecified obesity type  PLAN:  Vitamin D Deficiency Catherine Coleman was informed that low vitamin D levels contributes to fatigue and are associated with obesity, breast, and colon cancer. She agrees to continue to take prescription Vit D @50 ,000 IU every week #4 with no refills and will follow up for routine testing of vitamin D, at least 2-3 times per year. She was informed of the risk of over-replacement of vitamin D and agrees to not increase her dose unless she discusses this with [place] first. We will check labs. Catherine Coleman agrees to follow up with our clinic in 2 weeks.  Hyperlipidemia Catherine Coleman was informed of the American Heart Association Guidelines emphasizing intensive lifestyle modifications as the first line treatment for hyperlipidemia. We discussed many lifestyle modifications today in depth, and [name] will continue to work on decreasing saturated fats such as fatty red meat, butter and many fried foods. She will also increase vegetables and lean protein in her diet and continue to work on exercise and weight loss efforts. We will check labs. [name] agrees to follow up with our clinic in 2 weeks.  Pre-Diabetes Catherine Coleman will continue to work on weight loss, exercise, and decreasing simple carbohydrates in her diet to help decrease the risk of diabetes. She was informed that eating too many simple carbohydrates or too many calories at one sitting increases the likelihood of GI side effects. She is not on any medication currently. We will check labs. Catherine Coleman agrees to continue with weight loss and follow up with our clinic in 2 weeks.  Hypothyroidism  Catherine Coleman agrees to continue taking levothyroxine. We will check labs. She denies heat/cold intolerances.  Obesity Catherine Coleman is currently in the action stage of change. As such, her goal is to continue with weight loss efforts She has agreed to follow the Category 2 plan Catherine Coleman has been  instructed to work up to a goal of 150 minutes of combined cardio and strengthening exercise per week for weight loss and overall health benefits. We discussed the following Behavioral Modification Strategies today: work on meal planning and easy cooking plans and ways to avoid boredom eating  Catherine Coleman has agreed to follow up with our clinic in 2 weeks. She was informed of the importance of frequent follow up visits to maximize her success with intensive lifestyle modifications for her multiple health conditions.  ALLERGIES: Allergies  Allergen Reactions  . Other Other (See Comments)    Seasonal allergies(pollen & grass)    MEDICATIONS: Current Outpatient Medications on File Prior to Visit  Medication Sig Dispense Refill  . brimonidine (ALPHAGAN) 0.2 % ophthalmic solution Place 1 drop into both eyes 2 (two) times daily. 5 mL 12  . Cholecalciferol (VITAMIN D) 2000 units CAPS Take 1 capsule (2,000 Units total) by mouth daily. 30 capsule 5  . fluticasone (FLONASE) 50 MCG/ACT nasal spray Place 2 sprays into both nostrils daily. 16 g 6  . levocetirizine (XYZAL) 5 MG tablet Take 1 tablet (5 mg total) by mouth every evening. 30 tablet 5  . levothyroxine (SYNTHROID, LEVOTHROID) 100 MCG tablet TAKE ONE (1) TABLET BY MOUTH EVERY DAY BEFORE BREAKFAST 90 tablet 1  . losartan (COZAAR) 50 MG tablet TAKE ONE (1) TABLET BY MOUTH EVERY DAY 90 tablet 1  . Multiple Vitamins-Minerals (MULTIVITAMIN WITH MINERALS) tablet Take 1 tablet by mouth daily.    Marland Kitchen omeprazole (PRILOSEC) 20 MG capsule Take 1 capsule (20 mg total) by mouth daily. 90 capsule 1  . polyethylene glycol (MIRALAX / GLYCOLAX) packet Take 17 g by mouth daily. 14 each 0  . ranitidine (ZANTAC) 300 MG tablet Take 1 tablet (300 mg total) by mouth at bedtime. 90 tablet 1   No current facility-administered medications on file prior to visit.     PAST MEDICAL HISTORY: Past Medical History:  Diagnosis Date  . Arthritis   . Atrophic rhinitis 04/21/2016    . Constipation   . Decreased hearing   . Diverticulitis   . Dry eyes   . Fatigue   . Floaters in visual field   . Gall bladder disease   . GERD (gastroesophageal reflux disease)   . Glaucoma   . Hiatal hernia with gastroesophageal reflux   . History of blood transfusion    for Knee replacement  . Hyperlipidemia, mild 04/02/2016  . Hypertension   . Hypothyroidism   . Joint pain   . Migraine aura without headache   . Muscle pain   . Muscle stiffness   . Nasal congestion   . Obesity 04/02/2016  . Osteoarthritis   . Parotiditis 04/21/2016  . Vitamin D deficiency 04/02/2016    PAST SURGICAL HISTORY: Past Surgical History:  Procedure Laterality Date  . APPENDECTOMY    . cataract surgery  2008  . CHOLECYSTECTOMY  2010  . JOINT REPLACEMENT Left    2012  . MASTOIDECTOMY    . POSTERIOR CERVICAL FUSION/FORAMINOTOMY N/A 04/27/2017   Procedure: CERVICAL FIVE-SIX  OPEN REDUCTION OF FRACTURE, CERVICAL THREE-SEVEN  DORSAL FIXATION AND FUSION;  Surgeon: Ditty, Kevan Ny, MD;  Location: [place];  Service: Neurosurgery;  Laterality:  N/A;  . SHOULDER ARTHROSCOPY Bilateral prior to 2010  . TONSILLECTOMY    . TOTAL HIP ARTHROPLASTY Right 03/22/2015   Procedure: RIGHT TOTAL HIP ARTHROPLASTY ANTERIOR APPROACH;  Surgeon: Dorna Leitz, MD;  Location: [place];  Service: Orthopedics;  Laterality: Right;  . TOTAL KNEE ARTHROPLASTY Bilateral 2007  . TUBAL LIGATION      SOCIAL HISTORY: Social History   Tobacco Use  . Smoking status: Former Research scientist (life sciences)  . Smokeless tobacco: Never Used  . Tobacco comment: Stopped 50 years ago.   Substance Use Topics  . Alcohol use: Yes    Alcohol/week: 0.0 standard drinks    Comment: socially  . Drug use: No    FAMILY HISTORY: Family History  Problem Relation Age of Onset  . Dementia Mother   . Hypertension Mother   . Obesity Mother   . Heart disease Father   . Hypertension Father   . Stroke Father   . Diabetes Father   . Hyperlipidemia Father   .  Alcoholism Father   . Obesity Father   . Arthritis Daughter   . Cancer Maternal Grandfather        colon cancer    ROS: Review of Systems  Constitutional: Positive for weight loss.  Cardiovascular: Negative for chest pain and claudication.  Gastrointestinal: Negative for nausea and vomiting.  Genitourinary: Negative for frequency.  Musculoskeletal:       Negative for muscle weakness  Endo/Heme/Allergies: Negative for polydipsia.       Negative for hypoglycemia Negative for polyphagia Negative for hot/cold intolerance     PHYSICAL EXAM: Blood pressure 104/70, pulse 67, temperature 97.8 F (36.6 C), temperature source Oral, height 5\' 7"  (1.702 m), weight 201 lb (91.2 kg), SpO2 95 %. Body mass index is 31.48 kg/m. Physical Exam Vitals signs reviewed.  Constitutional:      Appearance: Normal appearance. She is obese.  Cardiovascular:     Rate and Rhythm: Normal rate.     Pulses: Normal pulses.  Pulmonary:     Effort: Pulmonary effort is normal.  Musculoskeletal: Normal range of motion.  Skin:    General: Skin is warm and dry.  Neurological:     Mental Status: She is alert and oriented to person, place, and time.  Psychiatric:        Mood and Affect: Mood normal.        Behavior: Behavior normal.     RECENT LABS AND TESTS: BMET    Component Value Date/Time   NA 136 01/28/2018 1030   K 4.4 01/28/2018 1030   CL 100 01/28/2018 1030   CO2 27 01/28/2018 1030   GLUCOSE 113 (H) 01/28/2018 1030   BUN 13 01/28/2018 1030   CREATININE 0.67 01/28/2018 1030   CALCIUM 9.6 01/28/2018 1030   GFRNONAA >60 05/15/2017 0524   GFRAA >60 05/15/2017 0524   Lab Results  Component Value Date   HGBA1C 6.2 (H) 05/30/2018   HGBA1C 6.1 01/28/2018   HGBA1C 6.4 10/18/2017   Lab Results  Component Value Date   INSULIN 15.5 05/30/2018   CBC    Component Value Date/Time   WBC 5.4 01/28/2018 1030   RBC 4.50 01/28/2018 1030   HGB 14.5 01/28/2018 1030   HCT 43.0 01/28/2018 1030    PLT 217.0 01/28/2018 1030   MCV 95.6 01/28/2018 1030   MCH 30.6 05/15/2017 0524   MCHC 33.8 01/28/2018 1030   RDW 13.1 01/28/2018 1030   LYMPHSABS 1.4 05/15/2017 0524   MONOABS 0.6 05/15/2017 0524  EOSABS 0.3 05/15/2017 0524   BASOSABS 0.0 05/15/2017 0524   Iron/TIBC/Ferritin/ %Sat No results found for: IRON, TIBC, FERRITIN, IRONPCTSAT Lipid Panel     Component Value Date/Time   CHOL 230 (H) 01/28/2018 1030   TRIG 212.0 (H) 01/28/2018 1030   HDL 59.90 01/28/2018 1030   CHOLHDL 4 01/28/2018 1030   VLDL 42.4 (H) 01/28/2018 1030   LDLCALC 102 (H) 10/18/2017 1131   LDLDIRECT 144.0 01/28/2018 1030   Hepatic Function Panel     Component Value Date/Time   PROT 7.2 01/28/2018 1030   ALBUMIN 4.5 01/28/2018 1030   AST 17 01/28/2018 1030   ALT 14 01/28/2018 1030   ALKPHOS 86 01/28/2018 1030   BILITOT 0.4 01/28/2018 1030      Component Value Date/Time   TSH 3.62 01/28/2018 1030   TSH 2.46 10/18/2017 1131   TSH 1.04 06/14/2017 1228     Ref. Range 05/30/2018 10:31  Vitamin D, 25-Hydroxy Latest Ref Range: 30.0 - 100.0 ng/mL 38.2     OBESITY BEHAVIORAL INTERVENTION VISIT  Today's visit was # 7   Starting weight: 210 lbs Starting date: 05/30/2018 Today's weight :: 201 lb  Today's date: 09/28/2018 Total lbs lost to date: 9 At least 15 minutes were spent on discussing the following behavioral intervention visit.   ASK: We discussed the diagnosis of obesity with [name] today and Kaitelyn agreed to give [place] permission to discuss obesity behavioral modification therapy today.  ASSESS: Gilberto has the diagnosis of obesity and her BMI today is 31.47 Archana is in the action stage of change   ADVISE: Constance was educated on the multiple health risks of obesity as well as the benefit of weight loss to improve her health. She was advised of the need for long term treatment and the importance of lifestyle modifications to improve her current health and to decrease her risk of  future health problems.  AGREE: Multiple dietary modification options and treatment options were discussed and  Jnai agreed to follow the recommendations documented in the above note.  ARRANGE: Aneyah was educated on the importance of frequent visits to treat obesity as outlined per CMS and USPSTF guidelines and agreed to schedule her next follow up appointment today.  I, Tammy Wysor, am acting as Location manager for [employer], PA-C I, Abby Potash, PA-C have reviewed above note and agree with its content

## 2018-09-29 LAB — LIPID PANEL
CHOLESTEROL TOTAL: 226 mg/dL — AB (ref 100–199)
Chol/HDL Ratio: 3.8 ratio (ref 0.0–4.4)
HDL: 60 mg/dL (ref >39)
LDL Calculated: 134 mg/dL — ABNORMAL HIGH (ref 0–99)
TRIGLYCERIDES: 159 mg/dL — AB (ref 0–149)
VLDL Cholesterol Cal: 32 mg/dL (ref 5–40)

## 2018-09-29 LAB — COMPREHENSIVE METABOLIC PANEL
ALBUMIN: 4.6 g/dL (ref 3.5–4.7)
ALK PHOS: 95 IU/L (ref 39–117)
ALT: 13 IU/L (ref 0–32)
AST: 17 IU/L (ref 0–40)
Albumin/Globulin Ratio: 2.2 (ref 1.2–2.2)
BUN / CREAT RATIO: 14 (ref 12–28)
BUN: 9 mg/dL (ref 8–27)
Bilirubin Total: 0.3 mg/dL (ref 0.0–1.2)
CALCIUM: 9.2 mg/dL (ref 8.7–10.3)
CO2: 18 mmol/L — AB (ref 20–29)
CREATININE: 0.66 mg/dL (ref 0.57–1.00)
Chloride: 100 mmol/L (ref 96–106)
GFR calc Af Amer: 94 mL/min/{1.73_m2} (ref >59)
GFR calc non Af Amer: 81 mL/min/{1.73_m2} (ref >59)
GLUCOSE: 111 mg/dL — AB (ref 65–99)
Globulin, Total: 2.1 g/dL (ref 1.5–4.5)
Potassium: 4.6 mmol/L (ref 3.5–5.2)
Sodium: 137 mmol/L (ref 134–144)
Total Protein: 6.7 g/dL (ref 6.0–8.5)

## 2018-09-29 LAB — INSULIN, RANDOM: INSULIN: 9.8 u[IU]/mL (ref 2.6–24.9)

## 2018-09-29 LAB — TSH: TSH: 13.39 u[IU]/mL — AB (ref 0.450–4.500)

## 2018-09-29 LAB — HEMOGLOBIN A1C
ESTIMATED AVERAGE GLUCOSE: 128 mg/dL
HEMOGLOBIN A1C: 6.1 % — AB (ref 4.8–5.6)

## 2018-09-29 LAB — T4, FREE: Free T4: 0.73 ng/dL — ABNORMAL LOW (ref 0.82–1.77)

## 2018-09-29 LAB — T3: T3 TOTAL: 66 ng/dL — AB (ref 71–180)

## 2018-09-29 LAB — VITAMIN D 25 HYDROXY (VIT D DEFICIENCY, FRACTURES): Vit D, 25-Hydroxy: 41.6 ng/mL (ref 30.0–100.0)

## 2018-10-03 ENCOUNTER — Other Ambulatory Visit (INDEPENDENT_AMBULATORY_CARE_PROVIDER_SITE_OTHER): Payer: Self-pay

## 2018-10-03 ENCOUNTER — Telehealth (INDEPENDENT_AMBULATORY_CARE_PROVIDER_SITE_OTHER): Payer: Self-pay | Admitting: Family Medicine

## 2018-10-03 DIAGNOSIS — E038 Other specified hypothyroidism: Secondary | ICD-10-CM

## 2018-10-03 MED ORDER — LEVOTHYROXINE SODIUM 112 MCG PO TABS: 112.0000 ug | ORAL_TABLET | Freq: Every day | ORAL | 0 refills | Status: DC

## 2018-10-03 NOTE — Telephone Encounter (Signed)
Patient stated Catherine Coleman had called her the end of last week and would like her to call back again. Patient stated she will be home today until 2:00 pm.  Patient wanted to know also why lab results have not been posted to my chart.  Call back number [phone]

## 2018-10-04 ENCOUNTER — Ambulatory Visit: Payer: Medicare Other | Admitting: Family Medicine

## 2018-10-13 ENCOUNTER — Ambulatory Visit (INDEPENDENT_AMBULATORY_CARE_PROVIDER_SITE_OTHER): Payer: Medicare Other | Admitting: Bariatrics

## 2018-10-13 ENCOUNTER — Encounter (INDEPENDENT_AMBULATORY_CARE_PROVIDER_SITE_OTHER): Payer: Self-pay | Admitting: Bariatrics

## 2018-10-13 VITALS — BP 132/84 | HR 79 | Temp 98.7°F | Ht 67.0 in | Wt 203.0 lb

## 2018-10-13 DIAGNOSIS — E038 Other specified hypothyroidism: Secondary | ICD-10-CM | POA: Diagnosis not present

## 2018-10-13 DIAGNOSIS — Z6831 Body mass index (BMI) 31.0-31.9, adult: Secondary | ICD-10-CM

## 2018-10-13 DIAGNOSIS — E559 Vitamin D deficiency, unspecified: Secondary | ICD-10-CM | POA: Diagnosis not present

## 2018-10-13 DIAGNOSIS — I10 Essential (primary) hypertension: Secondary | ICD-10-CM

## 2018-10-13 DIAGNOSIS — R7989 Other specified abnormal findings of blood chemistry: Secondary | ICD-10-CM | POA: Diagnosis not present

## 2018-10-13 DIAGNOSIS — E669 Obesity, unspecified: Secondary | ICD-10-CM | POA: Diagnosis not present

## 2018-10-13 DIAGNOSIS — R7303 Prediabetes: Secondary | ICD-10-CM | POA: Diagnosis not present

## 2018-10-14 LAB — T3: T3, Total: 71 ng/dL (ref 71–180)

## 2018-10-14 LAB — T4, FREE: FREE T4: 1.07 ng/dL (ref 0.82–1.77)

## 2018-10-14 LAB — TSH: TSH: 7.71 u[IU]/mL — AB (ref 0.450–4.500)

## 2018-10-17 ENCOUNTER — Telehealth (INDEPENDENT_AMBULATORY_CARE_PROVIDER_SITE_OTHER): Payer: Self-pay | Admitting: Bariatrics

## 2018-10-17 NOTE — Progress Notes (Signed)
Office: [phone]  /  Fax: [phone]   HPI:   Chief Complaint: OBESITY Catherine Coleman is here to discuss her progress with her obesity treatment plan. She is on the Category 2 plan and is following her eating plan approximately 0 % of the time. She states she is doing water aerobics, walking and playing volleyball for 90 minutes 3 times per week. Catherine Coleman has eaten too many sweet foods. Her weight is 203 lb (92.1 kg) today and has had a weight gain of 2 pounds over a period of 2 weeks since her last visit. She has lost 7 lbs since starting treatment with [place].  Pre-Diabetes Catherine Coleman has a diagnosis of prediabetes based on her elevated Hgb A1c and was informed this puts her at greater risk of developing diabetes. Her last A1c was at 6.1 and last insulin level is at 9.8 She is not taking medications currently and continues to work on diet and exercise to decrease risk of diabetes. She denies nausea or hypoglycemia.  Vitamin D deficiency Catherine Coleman has a diagnosis of vitamin D deficiency. Her last vitamin D level was at 41.6 and is slowly going up. She is currently taking vit D and denies nausea, vomiting or muscle weakness.  Hypertension Catherine Coleman is a 85 y.o. female with hypertension. She is taking Losartan. [name] denies chest pain or headaches. She is working weight loss to help control her blood pressure with the goal of decreasing her risk of heart attack and stroke. Catherine Coleman blood pressure is currently controlled.  Elevated TSH (Hypothyroidism) Catherine Coleman has a diagnosis of elevated TSH (hypothyroidism). Her last TSH was at 13.390 She has had no change in her dose or brands of medications. She had been taking 100 mcg to 112 mcg. Her T3 and T4 have decreased. She has increased fatigue.  ASSESSMENT AND PLAN:  Prediabetes  Vitamin D deficiency  Essential hypertension  Elevated TSH - Plan: T3, T4, free, TSH  Other specified hypothyroidism - Plan: T3, T4, free, TSH  Class 1  obesity with serious comorbidity and body mass index (BMI) of 31.0 to 31.9 in adult, unspecified obesity type  PLAN:  Pre-Diabetes Catherine Coleman will continue to work on weight loss, exercise, increasing lean protein and decreasing simple carbohydrates in her diet to help decrease the risk of diabetes. She was informed that eating too many simple carbohydrates or too many calories at one sitting increases the likelihood of GI side effects. Kathlyne agreed to follow up with [place] as directed to monitor her progress.  Vitamin D Deficiency Catherine Coleman was informed that low vitamin D levels contributes to fatigue and are associated with obesity, breast, and colon cancer. She agrees to continue to take prescription Vit D @50 ,000 IU every week and will follow up for routine testing of vitamin D, at least 2-3 times per year. She was informed of the risk of over-replacement of vitamin D and agrees to not increase her dose unless she discusses this with [place] first.  Hypertension We discussed sodium restriction, working on healthy weight loss, and a regular exercise program as the means to achieve improved blood pressure control. Catherine Coleman agreed with this plan and agreed to follow up as directed. We will continue to monitor her blood pressure as well as her progress with the above lifestyle modifications. She will continue her medications as prescribed and will watch for signs of hypotension as she continues her lifestyle modifications.  Elevated TSH (Hypothyroidism) We will just repeat her thyroid panel to make sure the test was  accurate, (probably not change medication).  Obesity Catherine Coleman is currently in the action stage of change. As such, her goal is to continue with weight loss efforts She has agreed to follow the Category 2 plan [name] has been instructed to work up to a goal of 150 minutes of combined cardio and strengthening exercise per week for weight loss and overall health benefits. We discussed the following  Behavioral Modification Strategies today: increase H2O intake, keeping healthy foods in the home, increasing lean protein intake, decreasing simple carbohydrates, increasing vegetables and work on meal planning and intentional Catherine Coleman has agreed to follow up with our clinic in 2 weeks. She was informed of the importance of frequent follow up visits to maximize her success with intensive lifestyle modifications for her multiple health conditions.  ALLERGIES: Allergies  Allergen Reactions  . Other Other (See Comments)    Seasonal allergies(pollen & grass)    MEDICATIONS: Current Outpatient Medications on File Prior to Visit  Medication Sig Dispense Refill  . brimonidine (ALPHAGAN) 0.2 % ophthalmic solution Place 1 drop into both eyes 2 (two) times daily. 5 mL 12  . Cholecalciferol (VITAMIN D) 2000 units CAPS Take 1 capsule (2,000 Units total) by mouth daily. 30 capsule 5  . fluticasone (FLONASE) 50 MCG/ACT nasal spray Place 2 sprays into both nostrils daily. 16 g 6  . levocetirizine (XYZAL) 5 MG tablet Take 1 tablet (5 mg total) by mouth every evening. 30 tablet 5  . levothyroxine (SYNTHROID, LEVOTHROID) 112 MCG tablet Take 1 tablet (112 mcg total) by mouth daily before breakfast. 30 tablet 0  . losartan (COZAAR) 50 MG tablet TAKE ONE (1) TABLET BY MOUTH EVERY DAY 90 tablet 1  . Multiple Vitamins-Minerals (MULTIVITAMIN WITH MINERALS) tablet Take 1 tablet by mouth daily.    Marland Kitchen omeprazole (PRILOSEC) 20 MG capsule Take 1 capsule (20 mg total) by mouth daily. 90 capsule 1  . polyethylene glycol (MIRALAX / GLYCOLAX) packet Take 17 g by mouth daily. 14 each 0  . ranitidine (ZANTAC) 300 MG tablet Take 1 tablet (300 mg total) by mouth at bedtime. 90 tablet 1  . Vitamin D, Ergocalciferol, (DRISDOL) 1.25 MG (50000 UT) CAPS capsule Take 1 capsule (50,000 Units total) by mouth every 7 (seven) days. 4 capsule 0   No current facility-administered medications on file prior to visit.     PAST  MEDICAL HISTORY: Past Medical History:  Diagnosis Date  . Arthritis   . Atrophic rhinitis 04/21/2016  . Constipation   . Decreased hearing   . Diverticulitis   . Dry eyes   . Fatigue   . Floaters in visual field   . Gall bladder disease   . GERD (gastroesophageal reflux disease)   . Glaucoma   . Hiatal hernia with gastroesophageal reflux   . History of blood transfusion    for Knee replacement  . Hyperlipidemia, mild 04/02/2016  . Hypertension   . Hypothyroidism   . Joint pain   . Migraine aura without headache   . Muscle pain   . Muscle stiffness   . Nasal congestion   . Obesity 04/02/2016  . Osteoarthritis   . Parotiditis 04/21/2016  . Vitamin D deficiency 04/02/2016    PAST SURGICAL HISTORY: Past Surgical History:  Procedure Laterality Date  . APPENDECTOMY    . cataract surgery  2008  . CHOLECYSTECTOMY  2010  . JOINT REPLACEMENT Left    2012  . MASTOIDECTOMY    . POSTERIOR CERVICAL FUSION/FORAMINOTOMY N/A 04/27/2017  Procedure: CERVICAL FIVE-SIX  OPEN REDUCTION OF FRACTURE, CERVICAL THREE-SEVEN  DORSAL FIXATION AND FUSION;  Surgeon: Ditty, Kevan Ny, MD;  Location: [facility];  Service: Neurosurgery;  Laterality: N/A;  . SHOULDER ARTHROSCOPY Bilateral prior to 2010  . TONSILLECTOMY    . TOTAL HIP ARTHROPLASTY Right 03/22/2015   Procedure: RIGHT TOTAL HIP ARTHROPLASTY ANTERIOR APPROACH;  Surgeon: Dorna Leitz, MD;  [facility];  Service: Orthopedics;  Laterality: Right;  . TOTAL KNEE ARTHROPLASTY Bilateral 2007  . TUBAL LIGATION      SOCIAL HISTORY: Social History   Tobacco Use  . Smoking status: Former Research scientist (life sciences)  . Smokeless tobacco: Never Used  . Tobacco comment: Stopped 50 years ago.   Substance Use Topics  . Alcohol use: Yes    Alcohol/week: 0.0 standard drinks    Comment: socially  . Drug use: No    FAMILY HISTORY: Family History  Problem Relation Age of Onset  . Dementia Mother   . Hypertension Mother   . Obesity Mother   . Heart disease Father     . Hypertension Father   . Stroke Father   . Diabetes Father   . Hyperlipidemia Father   . Alcoholism Father   . Obesity Father   . Arthritis Daughter   . Cancer Maternal Grandfather        colon cancer    ROS: Review of Systems  Constitutional: Positive for malaise/fatigue. Negative for weight loss.  Cardiovascular: Negative for chest pain.  Gastrointestinal: Negative for nausea and vomiting.  Musculoskeletal:       Negative for muscle weakness  Neurological: Negative for headaches.  Endo/Heme/Allergies:       Negative for hpoglycemia    PHYSICAL EXAM: Blood pressure 132/84, pulse 79, temperature 98.7 F (37.1 C), temperature source Oral, height 5\' 7"  (1.702 m), weight 203 lb (92.1 kg), SpO2 98 %. Body mass index is 31.79 kg/m. Physical Exam Vitals signs reviewed.  Constitutional:      Appearance: Normal appearance. She is well-developed. She is obese.  Cardiovascular:     Rate and Rhythm: Normal rate.  Pulmonary:     Effort: Pulmonary effort is normal.  Musculoskeletal: Normal range of motion.  Skin:    General: Skin is warm and dry.  Neurological:     Mental Status: She is alert and oriented to person, place, and time.  Psychiatric:        Mood and Affect: Mood normal.        Behavior: Behavior normal.     RECENT LABS AND TESTS: BMET    Component Value Date/Time   NA 137 09/28/2018 1147   K 4.6 09/28/2018 1147   CL 100 09/28/2018 1147   CO2 18 (L) 09/28/2018 1147   GLUCOSE 111 (H) 09/28/2018 1147   GLUCOSE 113 (H) 01/28/2018 1030   BUN 9 09/28/2018 1147   CREATININE 0.66 09/28/2018 1147   CALCIUM 9.2 09/28/2018 1147   GFRNONAA 81 09/28/2018 1147   GFRAA 94 09/28/2018 1147   Lab Results  Component Value Date   HGBA1C 6.1 (H) 09/28/2018   HGBA1C 6.2 (H) 05/30/2018   HGBA1C 6.1 01/28/2018   HGBA1C 6.4 10/18/2017   Lab Results  Component Value Date   INSULIN 9.8 09/28/2018   INSULIN 15.5 05/30/2018   CBC    Component Value Date/Time   WBC  5.4 01/28/2018 1030   RBC 4.50 01/28/2018 1030   HGB 14.5 01/28/2018 1030   HCT 43.0 01/28/2018 1030   PLT 217.0 01/28/2018 1030  MCV 95.6 01/28/2018 1030   MCH 30.6 05/15/2017 0524   MCHC 33.8 01/28/2018 1030   RDW 13.1 01/28/2018 1030   LYMPHSABS 1.4 05/15/2017 0524   MONOABS 0.6 05/15/2017 0524   EOSABS 0.3 05/15/2017 0524   BASOSABS 0.0 05/15/2017 0524   Iron/TIBC/Ferritin/ %Sat No results found for: IRON, TIBC, FERRITIN, IRONPCTSAT Lipid Panel     Component Value Date/Time   CHOL 226 (H) 09/28/2018 1147   TRIG 159 (H) 09/28/2018 1147   HDL 60 09/28/2018 1147   CHOLHDL 3.8 09/28/2018 1147   CHOLHDL 4 01/28/2018 1030   VLDL 42.4 (H) 01/28/2018 1030   LDLCALC 134 (H) 09/28/2018 1147   LDLDIRECT 144.0 01/28/2018 1030   Hepatic Function Panel     Component Value Date/Time   PROT 6.7 09/28/2018 1147   ALBUMIN 4.6 09/28/2018 1147   AST 17 09/28/2018 1147   ALT 13 09/28/2018 1147   ALKPHOS 95 09/28/2018 1147   BILITOT 0.3 09/28/2018 1147      Component Value Date/Time   TSH 7.710 (H) 10/13/2018 1404   TSH 13.390 (H) 09/28/2018 1147   TSH 3.62 01/28/2018 1030    Ref. Range 09/28/2018 11:47  Vitamin D, 25-Hydroxy Latest Ref Range: 30.0 - 100.0 ng/mL 41.6     OBESITY BEHAVIORAL INTERVENTION VISIT  Today's visit was # 8   Starting weight: 210 lbs Starting date: 05/30/2018 Today's weight : 203 lbs Today's date: 10/13/2018 Total lbs lost to date: 7 At least 15 minutes were spent on discussing the following behavioral intervention visit.   ASK: We discussed the diagnosis of obesity with [name] today and Zanyia agreed to give [place] permission to discuss obesity behavioral modification therapy today.  ASSESS: Brittnye has the diagnosis of obesity and her BMI today is 31.79 Kenitra is in the action stage of change   ADVISE: Shaleen was educated on the multiple health risks of obesity as well as the benefit of weight loss to improve her health. She was  advised of the need for long term treatment and the importance of lifestyle modifications to improve her current health and to decrease her risk of future health problems.  AGREE: Multiple dietary modification options and treatment options were discussed and  Blessyn agreed to follow the recommendations documented in the above note.  ARRANGE: Cystal was educated on the importance of frequent visits to treat obesity as outlined per CMS and USPSTF guidelines and agreed to schedule her next follow up appointment today.  Corey Skains, am acting as Location manager for [employer]. Owens Shark, DO  I have reviewed the above documentation for accuracy and completeness, and I agree with the above. -Jearld Lesch, DO

## 2018-10-17 NOTE — Telephone Encounter (Signed)
Spoke with patient and told her that her TSH is improving and that her T4 and T3 have normalized. She will continue her on current dose of Levothyroxine. Will recheck in 4 weeks.

## 2018-10-19 ENCOUNTER — Ambulatory Visit: Payer: Medicare Other | Admitting: *Deleted

## 2018-10-21 ENCOUNTER — Ambulatory Visit: Payer: Medicare Other | Admitting: *Deleted

## 2018-10-27 ENCOUNTER — Ambulatory Visit (INDEPENDENT_AMBULATORY_CARE_PROVIDER_SITE_OTHER): Payer: Medicare Other | Admitting: Family Medicine

## 2018-11-03 NOTE — Progress Notes (Deleted)
Subjective:   Catherine Coleman is a 85 y.o. female who presents for Medicare Annual (Subsequent) preventive examination.  Review of Systems: No ROS.  Medicare Wellness Visit. Additional risk factors are reflected in the social history.   Sleep patterns:  Home Safety/Smoke Alarms: Feels safe in home. Smoke alarms in place.    Female:       Mammo-06/17/18       Dexa scan- 04/15/16            Objective:     Vitals: There were no vitals taken for this visit.  There is no height or weight on file to calculate BMI.  Advanced Directives 06/20/2018 10/18/2017 05/14/2017 05/14/2017 04/30/2017 04/27/2017 04/26/2017  Does Patient Have a Medical Advance Directive? Yes No;Yes - Yes Yes No No  Type of Paramedic of [place];Living will [place];Living will [place];Living will [place];Living will [place];Living will - -  Does patient want to make changes to medical advance directive? No - Patient declined - No - Patient declined - No - Patient declined - -  Copy of [place] in Chart? No - copy requested Yes Yes - No - copy requested - -  Would patient like information on creating a medical advance directive? - - - - No - Patient declined No - Patient declined -    Tobacco Social History   Tobacco Use  Smoking Status Former Smoker  Smokeless Tobacco Never Used  Tobacco Comment   Stopped 50 years ago.      Counseling given: Not Answered Comment: Stopped 50 years ago.    Clinical Intake:                       Past Medical History:  Diagnosis Date  . Arthritis   . Atrophic rhinitis 04/21/2016  . Constipation   . Decreased hearing   . Diverticulitis   . Dry eyes   . Fatigue   . Floaters in visual field   . Gall bladder disease   . GERD (gastroesophageal reflux disease)   . Glaucoma   . Hiatal hernia with gastroesophageal reflux   . History of blood  transfusion    for Knee replacement  . Hyperlipidemia, mild 04/02/2016  . Hypertension   . Hypothyroidism   . Joint pain   . Migraine aura without headache   . Muscle pain   . Muscle stiffness   . Nasal congestion   . Obesity 04/02/2016  . Osteoarthritis   . Parotiditis 04/21/2016  . Vitamin D deficiency 04/02/2016   Past Surgical History:  Procedure Laterality Date  . APPENDECTOMY    . cataract surgery  2008  . CHOLECYSTECTOMY  2010  . JOINT REPLACEMENT Left    2012  . MASTOIDECTOMY    . POSTERIOR CERVICAL FUSION/FORAMINOTOMY N/A 04/27/2017   Procedure: CERVICAL FIVE-SIX  OPEN REDUCTION OF FRACTURE, CERVICAL THREE-SEVEN  DORSAL FIXATION AND FUSION;  Surgeon: Ditty, Kevan Ny, MD;  Location: [facility];  Service: Neurosurgery;  Laterality: N/A;  . SHOULDER ARTHROSCOPY Bilateral prior to 2010  . TONSILLECTOMY    . TOTAL HIP ARTHROPLASTY Right 03/22/2015   Procedure: RIGHT TOTAL HIP ARTHROPLASTY ANTERIOR APPROACH;  Surgeon: Dorna Leitz, MD;  Location: [facility];  Service: Orthopedics;  Laterality: Right;  . TOTAL KNEE ARTHROPLASTY Bilateral 2007  . TUBAL LIGATION     Family History  Problem Relation Age of Onset  . Dementia  Mother   . Hypertension Mother   . Obesity Mother   . Heart disease Father   . Hypertension Father   . Stroke Father   . Diabetes Father   . Hyperlipidemia Father   . Alcoholism Father   . Obesity Father   . Arthritis Daughter   . Cancer Maternal Grandfather        colon cancer   Social History   Socioeconomic History  . Marital status: Widowed    Spouse name: Not on file  . Number of children: Not on file  . Years of education: Not on file  . Highest education level: Not on file  Occupational History  . Occupation: Retired  Scientific laboratory technician  . Financial resource strain: Not on file  . Food insecurity:    Worry: Not on file    Inability: Not on file  . Transportation needs:    Medical: Not on file    Non-medical: Not on file  Tobacco Use  . Smoking  status: Former Research scientist (life sciences)  . Smokeless tobacco: Never Used  . Tobacco comment: Stopped 50 years ago.   Substance and Sexual Activity  . Alcohol use: Yes    Alcohol/week: 0.0 standard drinks    Comment: socially  . Drug use: No  . Sexual activity: Never  Lifestyle  . Physical activity:    Days per week: Not on file    Minutes per session: Not on file  . Stress: Not on file  Relationships  . Social connections:    Talks on phone: Not on file    Gets together: Not on file    Attends religious service: Not on file    Active member of club or organization: Not on file    Attends meetings of clubs or organizations: Not on file    Relationship status: Not on file  Other Topics Concern  . Not on file  Social History Narrative   Lives at [facility], widowed 7 years, no dietary restrictions. Retired from neuropsychiatry work.     Outpatient Encounter Medications as of 11/04/2018  Medication Sig  . brimonidine (ALPHAGAN) 0.2 % ophthalmic solution Place 1 drop into both eyes 2 (two) times daily.  . Cholecalciferol (VITAMIN D) 2000 units CAPS Take 1 capsule (2,000 Units total) by mouth daily.  . fluticasone (FLONASE) 50 MCG/ACT nasal spray Place 2 sprays into both nostrils daily.  Marland Kitchen levocetirizine (XYZAL) 5 MG tablet Take 1 tablet (5 mg total) by mouth every evening.  Marland Kitchen levothyroxine (SYNTHROID, LEVOTHROID) 112 MCG tablet Take 1 tablet (112 mcg total) by mouth daily before breakfast.  . losartan (COZAAR) 50 MG tablet TAKE ONE (1) TABLET BY MOUTH EVERY DAY  . Multiple Vitamins-Minerals (MULTIVITAMIN WITH MINERALS) tablet Take 1 tablet by mouth daily.  Marland Kitchen omeprazole (PRILOSEC) 20 MG capsule Take 1 capsule (20 mg total) by mouth daily.  . polyethylene glycol (MIRALAX / GLYCOLAX) packet Take 17 g by mouth daily.  . ranitidine (ZANTAC) 300 MG tablet Take 1 tablet (300 mg total) by mouth at bedtime.  . Vitamin D, Ergocalciferol, (DRISDOL) 1.25 MG (50000 UT) CAPS capsule Take 1 capsule (50,000 Units  total) by mouth every 7 (seven) days.   No facility-administered encounter medications on file as of 11/04/2018.     Activities of Daily Living No flowsheet data found.  Patient Care Team: [name], MD as PCP - General (Family Medicine)    Assessment:   This is a routine wellness examination for [place].Physical assessment deferred to  PCP.  Exercise Activities and Dietary recommendations   Diet (meal preparation, eat out, water intake, caffeinated beverages, dairy products, fruits and vegetables): {Desc; diets:16563} Breakfast: Lunch:  Dinner:      Goals    . Patient Stated (pt-stated)     Eat healthier and continue with 5 days/week of exercise.       Fall Risk Fall Risk  10/18/2017 06/09/2017  Falls in the past year? No Yes  Number falls in past yr: - 1  Injury with Fall? - Yes  Risk for fall due to : - Impaired balance/gait;Impaired mobility    Depression Screen PHQ 2/9 Scores 05/30/2018 10/18/2017 06/09/2017  PHQ - 2 Score 4 0 1  PHQ- 9 Score 6 - 8     Cognitive Function MMSE - Mini Mental State Exam 10/18/2017  Orientation to time 5  Orientation to Place 5  Registration 3  Attention/ Calculation 5  Recall 3  Language- name 2 objects 2  Language- repeat 1  Language- follow 3 step command 3  Language- read & follow direction 1  Write a sentence 1  Copy design 1  Total score 30        Immunization History  Administered Date(s) Administered  . Hepatitis A, Adult 02/17/2018  . Influenza, High Dose Seasonal PF 06/14/2017, 05/27/2018  . Influenza-Unspecified 06/12/2014, 06/24/2015  . Pneumococcal Conjugate-13 10/17/2013, 04/30/2015  . Pneumococcal Polysaccharide-23 09/14/1998, 09/14/2008, 01/28/2018  . Tetanus 10/17/2013  . Zoster Recombinat (Shingrix) 01/26/2017, 06/28/2017    Screening Tests Health Maintenance  Topic Date Due  . TETANUS/TDAP  10/18/2023  . INFLUENZA VACCINE  Completed  . DEXA SCAN  Completed  . PNA vac Low Risk Adult  Completed        Plan:   ***   I have personally reviewed and noted the following in the patient's chart:   . Medical and social history . Use of alcohol, tobacco or illicit drugs  . Current medications and supplements . Functional ability and status . Nutritional status . Physical activity . Advanced directives . List of other physicians . Hospitalizations, surgeries, and ER visits in previous 12 months . Vitals . Screenings to include cognitive, depression, and falls . Referrals and appointments  In addition, I have reviewed and discussed with patient certain preventive protocols, quality metrics, and best practice recommendations. A written personalized care plan for preventive services as well as general preventive health recommendations were provided to patient.     Naaman Plummer [place], [place]  11/03/2018

## 2018-11-04 ENCOUNTER — Ambulatory Visit: Payer: Medicare Other | Admitting: Family Medicine

## 2018-11-04 ENCOUNTER — Ambulatory Visit: Payer: Medicare Other | Admitting: *Deleted

## 2018-11-07 ENCOUNTER — Ambulatory Visit (INDEPENDENT_AMBULATORY_CARE_PROVIDER_SITE_OTHER): Payer: Medicare Other | Admitting: Family Medicine

## 2018-11-07 ENCOUNTER — Encounter (INDEPENDENT_AMBULATORY_CARE_PROVIDER_SITE_OTHER): Payer: Self-pay | Admitting: Family Medicine

## 2018-11-07 VITALS — BP 108/72 | HR 69 | Temp 97.8°F | Ht 67.0 in | Wt 201.0 lb

## 2018-11-07 DIAGNOSIS — Z6831 Body mass index (BMI) 31.0-31.9, adult: Secondary | ICD-10-CM | POA: Diagnosis not present

## 2018-11-07 DIAGNOSIS — E669 Obesity, unspecified: Secondary | ICD-10-CM

## 2018-11-07 DIAGNOSIS — E559 Vitamin D deficiency, unspecified: Secondary | ICD-10-CM | POA: Diagnosis not present

## 2018-11-07 DIAGNOSIS — E038 Other specified hypothyroidism: Secondary | ICD-10-CM

## 2018-11-07 MED ORDER — LEVOTHYROXINE SODIUM 112 MCG PO TABS: 112.0000 ug | ORAL_TABLET | Freq: Every day | ORAL | 0 refills | Status: DC

## 2018-11-07 NOTE — Progress Notes (Signed)
Office: [phone]  /  Fax: [phone]   HPI:   Chief Complaint: OBESITY Catherine Coleman is here to discuss her progress with her obesity treatment plan. She is on "the one they gave me" and is following her eating plan approximately 75% of the time. She states she is walking in water, chair exercises, and playing volleyball 60 minutes 2 times per week. Catherine Coleman lives in [place] and eats most meals in [employer]. She does not like frozen meals or cold cuts. She is disappointed with slow weight loss. Her weight is 201 lb (91.2 kg) today and has had a weight loss of 2 pounds over a period of 4 weeks since her last visit. She has lost 9 lbs since starting treatment with [place].  Hypothyroidism Catherine Coleman has a diagnosis of hypothyroidism. Her TSH was recently elevated and was still elevated on recheck. She is on levothyroxine, which was increased to 112 mcg daily recently.. She reports cold intolerance and positive fatigue but denies palpitations.   Vitamin D deficiency Catherine Coleman has a diagnosis of Vitamin D deficiency and her Vitamin D level is not at goal. Her last Vitamin D level was reported as 41.6 on 09/28/2018. She is currently taking prescription Vit D and denies nausea, vomiting or muscle weakness.  ASSESSMENT AND PLAN:  Other specified hypothyroidism - Plan: levothyroxine (SYNTHROID, LEVOTHROID) 112 MCG tablet  Vitamin D deficiency  Class 1 obesity with serious comorbidity and body mass index (BMI) of 31.0 to 31.9 in adult, unspecified obesity type  PLAN:  Hypothyroidism Catherine Coleman was informed of the importance of good thyroid control to help with weight loss efforts. She was also informed that supertheraputic thyroid levels are dangerous and will not improve weight loss results. Catherine Coleman was given a refill on her levothyroxine 112 mcg qam #30 with no refills and she agrees to follow-up with our clinic in 4 weeks. We will check a thyroid panel in 4-6 weeks.  Vitamin D  Deficiency Catherine Coleman was informed that low Vitamin D levels contributes to fatigue and are associated with obesity, breast, and colon cancer. She agrees to continue to take prescription Vit D and will follow-up for routine testing of Vitamin D, at least 2-3 times per year. She was informed of the risk of over-replacement of Vitamin D and agrees to not increase her dose unless she discusses this with [place] first. Catherine Coleman agrees to follow-up with our clinic in 4 weeks.  Obesity Catherine Coleman is currently in the action stage of change. As such, her goal is to continue with weight loss efforts. She has agreed to follow the Category 2 plan or the pescatarian plan with added lunch options. Encouragement was provided. Catherine Coleman will continue current exercise regimen for weight loss and overall health benefits. We discussed the following Behavioral Modification Strategies today: planning for success.  Catherine Coleman has agreed to follow-up with our clinic in 4 weeks. She was informed of the importance of frequent follow up visits to maximize her success with intensive lifestyle modifications for her multiple health conditions.  ALLERGIES: Allergies  Allergen Reactions  . Other Other (See Comments)    Seasonal allergies(pollen & grass)    MEDICATIONS: Current Outpatient Medications on File Prior to Visit  Medication Sig Dispense Refill  . brimonidine (ALPHAGAN) 0.2 % ophthalmic solution Place 1 drop into both eyes 2 (two) times daily. 5 mL 12  . Cholecalciferol (VITAMIN D) 2000 units CAPS Take 1 capsule (2,000 Units total) by mouth daily. 30 capsule 5  . fluticasone (FLONASE) 50  MCG/ACT nasal spray Place 2 sprays into both nostrils daily. 16 g 6  . levocetirizine (XYZAL) 5 MG tablet Take 1 tablet (5 mg total) by mouth every evening. 30 tablet 5  . losartan (COZAAR) 50 MG tablet TAKE ONE (1) TABLET BY MOUTH EVERY DAY 90 tablet 1  . Multiple Vitamins-Minerals (MULTIVITAMIN WITH MINERALS) tablet Take 1 tablet by mouth  daily.    Marland Kitchen omeprazole (PRILOSEC) 20 MG capsule Take 1 capsule (20 mg total) by mouth daily. 90 capsule 1  . polyethylene glycol (MIRALAX / GLYCOLAX) packet Take 17 g by mouth daily. 14 each 0  . ranitidine (ZANTAC) 300 MG tablet Take 1 tablet (300 mg total) by mouth at bedtime. 90 tablet 1  . Vitamin D, Ergocalciferol, (DRISDOL) 1.25 MG (50000 UT) CAPS capsule Take 1 capsule (50,000 Units total) by mouth every 7 (seven) days. 4 capsule 0   No current facility-administered medications on file prior to visit.     PAST MEDICAL HISTORY: Past Medical History:  Diagnosis Date  . Arthritis   . Atrophic rhinitis 04/21/2016  . Constipation   . Decreased hearing   . Diverticulitis   . Dry eyes   . Fatigue   . Floaters in visual field   . Gall bladder disease   . GERD (gastroesophageal reflux disease)   . Glaucoma   . Hiatal hernia with gastroesophageal reflux   . History of blood transfusion    for Knee replacement  . Hyperlipidemia, mild 04/02/2016  . Hypertension   . Hypothyroidism   . Joint pain   . Migraine aura without headache   . Muscle pain   . Muscle stiffness   . Nasal congestion   . Obesity 04/02/2016  . Osteoarthritis   . Parotiditis 04/21/2016  . Vitamin D deficiency 04/02/2016    PAST SURGICAL HISTORY: Past Surgical History:  Procedure Laterality Date  . APPENDECTOMY    . cataract surgery  2008  . CHOLECYSTECTOMY  2010  . JOINT REPLACEMENT Left    2012  . MASTOIDECTOMY    . POSTERIOR CERVICAL FUSION/FORAMINOTOMY N/A 04/27/2017   Procedure: CERVICAL FIVE-SIX  OPEN REDUCTION OF FRACTURE, CERVICAL THREE-SEVEN  DORSAL FIXATION AND FUSION;  Surgeon: Ditty, Kevan Ny, MD;  Location: [facility];  Service: Neurosurgery;  Laterality: N/A;  . SHOULDER ARTHROSCOPY Bilateral prior to 2010  . TONSILLECTOMY    . TOTAL HIP ARTHROPLASTY Right 03/22/2015   Procedure: RIGHT TOTAL HIP ARTHROPLASTY ANTERIOR APPROACH;  Surgeon: Dorna Leitz, MD;  Location: [facility];  Service: Orthopedics;   Laterality: Right;  . TOTAL KNEE ARTHROPLASTY Bilateral 2007  . TUBAL LIGATION      SOCIAL HISTORY: Social History   Tobacco Use  . Smoking status: Former Research scientist (life sciences)  . Smokeless tobacco: Never Used  . Tobacco comment: Stopped 50 years ago.   Substance Use Topics  . Alcohol use: Yes    Alcohol/week: 0.0 standard drinks    Comment: socially  . Drug use: No    FAMILY HISTORY: Family History  Problem Relation Age of Onset  . Dementia Mother   . Hypertension Mother   . Obesity Mother   . Heart disease Father   . Hypertension Father   . Stroke Father   . Diabetes Father   . Hyperlipidemia Father   . Alcoholism Father   . Obesity Father   . Arthritis Daughter   . Cancer Maternal Grandfather        colon cancer   ROS: Review of Systems  Constitutional: Positive for  malaise/fatigue and weight loss.       Positive for cold intolerance.  Cardiovascular: Negative for palpitations.  Gastrointestinal: Negative for nausea and vomiting.  Musculoskeletal:       Negative for muscle weakness.   PHYSICAL EXAM: Blood pressure 108/72, pulse 69, temperature 97.8 F (36.6 C), temperature source Oral, height 5\' 7"  (1.702 m), weight 201 lb (91.2 kg), SpO2 98 %. Body mass index is 31.48 kg/m. Physical Exam Vitals signs reviewed.  Constitutional:      Appearance: Normal appearance. She is obese.  Cardiovascular:     Rate and Rhythm: Normal rate.     Pulses: Normal pulses.  Pulmonary:     Effort: Pulmonary effort is normal.     Breath sounds: Normal breath sounds.  Musculoskeletal: Normal range of motion.  Skin:    General: Skin is warm and dry.  Neurological:     Mental Status: She is alert and oriented to person, place, and time.  Psychiatric:        Behavior: Behavior normal.   RECENT LABS AND TESTS: BMET    Component Value Date/Time   NA 137 09/28/2018 1147   K 4.6 09/28/2018 1147   CL 100 09/28/2018 1147   CO2 18 (L) 09/28/2018 1147   GLUCOSE 111 (H) 09/28/2018 1147    GLUCOSE 113 (H) 01/28/2018 1030   BUN 9 09/28/2018 1147   CREATININE 0.66 09/28/2018 1147   CALCIUM 9.2 09/28/2018 1147   GFRNONAA 81 09/28/2018 1147   GFRAA 94 09/28/2018 1147   Lab Results  Component Value Date   HGBA1C 6.1 (H) 09/28/2018   HGBA1C 6.2 (H) 05/30/2018   HGBA1C 6.1 01/28/2018   HGBA1C 6.4 10/18/2017   Lab Results  Component Value Date   INSULIN 9.8 09/28/2018   INSULIN 15.5 05/30/2018   CBC    Component Value Date/Time   WBC 5.4 01/28/2018 1030   RBC 4.50 01/28/2018 1030   HGB 14.5 01/28/2018 1030   HCT 43.0 01/28/2018 1030   PLT 217.0 01/28/2018 1030   MCV 95.6 01/28/2018 1030   MCH 30.6 05/15/2017 0524   MCHC 33.8 01/28/2018 1030   RDW 13.1 01/28/2018 1030   LYMPHSABS 1.4 05/15/2017 0524   MONOABS 0.6 05/15/2017 0524   EOSABS 0.3 05/15/2017 0524   BASOSABS 0.0 05/15/2017 0524   Iron/TIBC/Ferritin/ %Sat No results found for: IRON, TIBC, FERRITIN, IRONPCTSAT Lipid Panel     Component Value Date/Time   CHOL 226 (H) 09/28/2018 1147   TRIG 159 (H) 09/28/2018 1147   HDL 60 09/28/2018 1147   CHOLHDL 3.8 09/28/2018 1147   CHOLHDL 4 01/28/2018 1030   VLDL 42.4 (H) 01/28/2018 1030   LDLCALC 134 (H) 09/28/2018 1147   LDLDIRECT 144.0 01/28/2018 1030   Hepatic Function Panel     Component Value Date/Time   PROT 6.7 09/28/2018 1147   ALBUMIN 4.6 09/28/2018 1147   AST 17 09/28/2018 1147   ALT 13 09/28/2018 1147   ALKPHOS 95 09/28/2018 1147   BILITOT 0.3 09/28/2018 1147      Component Value Date/Time   TSH 7.710 (H) 10/13/2018 1404   TSH 13.390 (H) 09/28/2018 1147   TSH 3.62 01/28/2018 1030    Ref. Range 09/28/2018 11:47  Vitamin D, 25-Hydroxy Latest Ref Range: 30.0 - 100.0 ng/mL 41.6   OBESITY BEHAVIORAL INTERVENTION VISIT  Today's visit was #9  Starting weight: 210 lbs Starting date: 05/30/2018 Today's weight: 201 lbs  Today's date: 11/07/2018 Total lbs lost to date: 9 At least 15 minutes  were spent on discussing the following  behavioral intervention visit.    11/07/2018  Height 5\' 7"  (1.702 m)  Weight 201 lb (91.2 kg)  BMI (Calculated) 31.47  BLOOD PRESSURE - SYSTOLIC 161  BLOOD PRESSURE - DIASTOLIC 72   Body Fat % 09.6 %  Total Body Water (lbs) 76.4 lbs   ASK: We discussed the diagnosis of obesity with [name] today and Perlie agreed to give [place] permission to discuss obesity behavioral modification therapy today.  ASSESS: Jonita has the diagnosis of obesity and her BMI today is 31.47. Angelle is in the action stage of change.   ADVISE: Kehlani was educated on the multiple health risks of obesity as well as the benefit of weight loss to improve her health. She was advised of the need for long term treatment and the importance of lifestyle modifications to improve her current health and to decrease her risk of future health problems.  AGREE: Multiple dietary modification options and treatment options were discussed and  Kamree agreed to follow the recommendations documented in the above note.  ARRANGE: Nyx was educated on the importance of frequent visits to treat obesity as outlined per CMS and USPSTF guidelines and agreed to schedule her next follow up appointment today.  IMichaelene Song, am acting as Location manager for [employer], FNP-C.  I have reviewed the above documentation for accuracy and completeness, and I agree with the above.  - Jonica Bickhart, FNP-C.

## 2018-11-08 ENCOUNTER — Encounter (INDEPENDENT_AMBULATORY_CARE_PROVIDER_SITE_OTHER): Payer: Self-pay | Admitting: Family Medicine

## 2018-11-11 ENCOUNTER — Ambulatory Visit (INDEPENDENT_AMBULATORY_CARE_PROVIDER_SITE_OTHER): Payer: Medicare Other | Admitting: *Deleted

## 2018-11-11 ENCOUNTER — Encounter: Payer: Self-pay | Admitting: *Deleted

## 2018-11-11 ENCOUNTER — Ambulatory Visit (INDEPENDENT_AMBULATORY_CARE_PROVIDER_SITE_OTHER): Payer: Medicare Other | Admitting: Family Medicine

## 2018-11-11 VITALS — BP 124/70 | HR 60 | Ht 67.0 in | Wt 206.2 lb

## 2018-11-11 VITALS — BP 124/70 | HR 60 | Temp 98.6°F | Resp 18 | Wt 206.0 lb

## 2018-11-11 DIAGNOSIS — E785 Hyperlipidemia, unspecified: Secondary | ICD-10-CM | POA: Diagnosis not present

## 2018-11-11 DIAGNOSIS — I10 Essential (primary) hypertension: Secondary | ICD-10-CM

## 2018-11-11 DIAGNOSIS — R739 Hyperglycemia, unspecified: Secondary | ICD-10-CM | POA: Diagnosis not present

## 2018-11-11 DIAGNOSIS — E2839 Other primary ovarian failure: Secondary | ICD-10-CM

## 2018-11-11 DIAGNOSIS — E559 Vitamin D deficiency, unspecified: Secondary | ICD-10-CM

## 2018-11-11 DIAGNOSIS — E038 Other specified hypothyroidism: Secondary | ICD-10-CM | POA: Diagnosis not present

## 2018-11-11 DIAGNOSIS — E669 Obesity, unspecified: Secondary | ICD-10-CM | POA: Diagnosis not present

## 2018-11-11 DIAGNOSIS — Z0000 Encounter for general adult medical examination without abnormal findings: Secondary | ICD-10-CM

## 2018-11-11 NOTE — Patient Instructions (Signed)
Please schedule your next medicare wellness visit with me in 1 yr.  Continue to eat heart healthy diet (full of fruits, vegetables, whole grains, lean protein, water--limit salt, fat, and sugar intake) and increase physical activity as tolerated.   Catherine Coleman , Thank you for taking time to come for your Medicare Wellness Visit. I appreciate your ongoing commitment to your health goals. Please review the following plan we discussed and let me know if I can assist you in the future.   These are the goals we discussed: Goals    . Patient Stated (pt-stated)     Eat healthier and continue with 3 days/week of exercise.       This is a list of the screening recommended for you and due dates:  Health Maintenance  Topic Date Due  . Tetanus Vaccine  10/18/2023  . Flu Shot  Completed  . DEXA scan (bone density measurement)  Completed  . Pneumonia vaccines  Completed    Health Maintenance After Age 63 After age 66, you are at a higher risk for certain long-term diseases and infections as well as injuries from falls. Falls are a major cause of broken bones and head injuries in people who are older than age 24. Getting regular preventive care can help to keep you healthy and well. Preventive care includes getting regular testing and making lifestyle changes as recommended by your health care provider. Talk with your health care provider about:  Which screenings and tests you should have. A screening is a test that checks for a disease when you have no symptoms.  A diet and exercise plan that is right for you. What should I know about screenings and tests to prevent falls? Screening and testing are the best ways to find a health problem early. Early diagnosis and treatment give you the best chance of managing medical conditions that are common after age 25. Certain conditions and lifestyle choices may make you more likely to have a fall. Your health care provider may recommend:  Regular vision  checks. Poor vision and conditions such as cataracts can make you more likely to have a fall. If you wear glasses, make sure to get your prescription updated if your vision changes.  Medicine review. Work with your health care provider to regularly review all of the medicines you are taking, including over-the-counter medicines. Ask your health care provider about any side effects that may make you more likely to have a fall. Tell your health care provider if any medicines that you take make you feel dizzy or sleepy.  Osteoporosis screening. Osteoporosis is a condition that causes the bones to get weaker. This can make the bones weak and cause them to break more easily.  Blood pressure screening. Blood pressure changes and medicines to control blood pressure can make you feel dizzy.  Strength and balance checks. Your health care provider may recommend certain tests to check your strength and balance while standing, walking, or changing positions.  Foot health exam. Foot pain and numbness, as well as not wearing proper footwear, can make you more likely to have a fall.  Depression screening. You may be more likely to have a fall if you have a fear of falling, feel emotionally low, or feel unable to do activities that you used to do.  Alcohol use screening. Using too much alcohol can affect your balance and may make you more likely to have a fall. What actions can I take to lower my risk of  falls? General instructions  Talk with your health care provider about your risks for falling. Tell your health care provider if: ? You fall. Be sure to tell your health care provider about all falls, even ones that seem minor. ? You feel dizzy, sleepy, or off-balance.  Take over-the-counter and prescription medicines only as told by your health care provider. These include any supplements.  Eat a healthy diet and maintain a healthy weight. A healthy diet includes low-fat dairy products, low-fat (lean)  meats, and fiber from whole grains, beans, and lots of fruits and vegetables. Home safety  Remove any tripping hazards, such as rugs, cords, and clutter.  Install safety equipment such as grab bars in bathrooms and safety rails on stairs.  Keep rooms and walkways well-lit. Activity   Follow a regular exercise program to stay fit. This will help you maintain your balance. Ask your health care provider what types of exercise are appropriate for you.  If you need a cane or walker, use it as recommended by your health care provider.  Wear supportive shoes that have nonskid soles. Lifestyle  Do not drink alcohol if your health care provider tells you not to drink.  If you drink alcohol, limit how much you have: ? 0-1 drink a day for women. ? 0-2 drinks a day for men.  Be aware of how much alcohol is in your drink. In the [place] one drink equals one typical bottle of beer (12 oz), one-half glass of wine (5 oz), or one shot of hard liquor (1 oz).  Do not use any products that contain nicotine or tobacco, such as cigarettes and e-cigarettes. If you need help quitting, ask your health care provider. Summary  Having a healthy lifestyle and getting preventive care can help to protect your health and wellness after age 37.  Screening and testing are the best way to find a health problem early and help you avoid having a fall. Early diagnosis and treatment give you the best chance for managing medical conditions that are more common for people who are older than age 80.  Falls are a major cause of broken bones and head injuries in people who are older than age 49. Take precautions to prevent a fall at home.  Work with your health care provider to learn what changes you can make to improve your health and wellness and to prevent falls. This information is not intended to replace advice given to you by your health care provider. Make sure you discuss any questions you have with your health care  provider. Document Released: 07/14/2017 Document Revised: 07/14/2017 Document Reviewed: 07/14/2017 Elsevier Interactive Patient Education  2019 [employer].

## 2018-11-11 NOTE — Progress Notes (Addendum)
Subjective:   Catherine Coleman is a 85 y.o. female who presents for Medicare Annual (Subsequent) preventive examination.  Review of Systems: No ROS.  Medicare Wellness Visit. Additional risk factors are reflected in the social history. Cardiac Risk Factors include: advanced age (>50men, >70 women);dyslipidemia;hypertension;obesity (BMI >30kg/m2) Sleep patterns:  No issues. Home Safety/Smoke Alarms: Feels safe in home. Smoke alarms in place.  Lives at individual house at [facility].      Mammo- 06/17/18      Dexa scan-  04/15/16. Declines at this time.    Eye- Dr.Digbyi every 6 months for glaucoma checks.     Objective:     Vitals: BP 124/70 (BP Location: Left Arm, Patient Position: Sitting, Cuff Size: Large)   Pulse 60   Ht 5\' 7"  (1.702 m)   Wt 206 lb 3.2 oz (93.5 kg)   SpO2 96%   BMI 32.30 kg/m   Body mass index is 32.3 kg/m.  Advanced Directives 11/11/2018 06/20/2018 10/18/2017 05/14/2017 05/14/2017 04/30/2017 04/27/2017  Does Patient Have a Medical Advance Directive? Yes Yes No;Yes - Yes Yes No  Type of Paramedic of [place];Living will [place];Living will [place];Living will [place];Living will [place];Living will [place];Living will -  Does patient want to make changes to medical advance directive? No - Patient declined No - Patient declined - No - Patient declined - No - Patient declined -  Copy of [place] in Chart? Yes - validated most recent copy scanned in chart (See row information) No - copy requested Yes Yes - No - copy requested -  Would patient like information on creating a medical advance directive? - - - - - No - Patient declined No - Patient declined    Tobacco Social History   Tobacco Use  Smoking Status Former Smoker  Smokeless Tobacco Never Used  Tobacco Comment   Stopped 50 years ago.     Counseling given: Not Answered Comment: Stopped 50 years ago.    Clinical Intake:     Pain : No/denies pain   Past Medical History:  Diagnosis Date  . Arthritis   . Atrophic rhinitis 04/21/2016  . Constipation   . Decreased hearing   . Diverticulitis   . Dry eyes   . Fatigue   . Floaters in visual field   . Gall bladder disease   . GERD (gastroesophageal reflux disease)   . Glaucoma   . Hiatal hernia with gastroesophageal reflux   . History of blood transfusion    for Knee replacement  . Hyperlipidemia, mild 04/02/2016  . Hypertension   . Hypothyroidism   . Joint pain   . Migraine aura without headache   . Muscle pain   . Muscle stiffness   . Nasal congestion   . Obesity 04/02/2016  . Osteoarthritis   . Parotiditis 04/21/2016  . Vitamin D deficiency 04/02/2016   Past Surgical History:  Procedure Laterality Date  . APPENDECTOMY    . cataract surgery  2008  . CHOLECYSTECTOMY  2010  . JOINT REPLACEMENT Left    2012  . MASTOIDECTOMY    . POSTERIOR CERVICAL FUSION/FORAMINOTOMY N/A 04/27/2017   Procedure: CERVICAL FIVE-SIX  OPEN REDUCTION OF FRACTURE, CERVICAL THREE-SEVEN  DORSAL FIXATION AND FUSION;  Surgeon: Ditty, Kevan Ny, MD;  Location: [facility];  Service: Neurosurgery;  Laterality: N/A;  . SHOULDER ARTHROSCOPY Bilateral prior to 2010  . TONSILLECTOMY    .  TOTAL HIP ARTHROPLASTY Right 03/22/2015   Procedure: RIGHT TOTAL HIP ARTHROPLASTY ANTERIOR APPROACH;  Surgeon: Dorna Leitz, MD;  Location: [facility];  Service: Orthopedics;  Laterality: Right;  . TOTAL KNEE ARTHROPLASTY Bilateral 2007  . TUBAL LIGATION     Family History  Problem Relation Age of Onset  . Dementia Mother   . Hypertension Mother   . Obesity Mother   . Heart disease Father   . Hypertension Father   . Stroke Father   . Diabetes Father   . Hyperlipidemia Father   . Alcoholism Father   . Obesity Father   . Arthritis Daughter   . Cancer Maternal Grandfather        colon cancer   Social  History   Socioeconomic History  . Marital status: Widowed    Spouse name: Not on file  . Number of children: Not on file  . Years of education: Not on file  . Highest education level: Not on file  Occupational History  . Occupation: Retired  Scientific laboratory technician  . Financial resource strain: Not on file  . Food insecurity:    Worry: Not on file    Inability: Not on file  . Transportation needs:    Medical: Not on file    Non-medical: Not on file  Tobacco Use  . Smoking status: Former Research scientist (life sciences)  . Smokeless tobacco: Never Used  . Tobacco comment: Stopped 50 years ago.   Substance and Sexual Activity  . Alcohol use: Yes    Alcohol/week: 0.0 standard drinks    Comment: socially 3-4x/wk  . Drug use: No  . Sexual activity: Never  Lifestyle  . Physical activity:    Days per week: Not on file    Minutes per session: Not on file  . Stress: Not on file  Relationships  . Social connections:    Talks on phone: Not on file    Gets together: Not on file    Attends religious service: Not on file    Active member of club or organization: Not on file    Attends meetings of clubs or organizations: Not on file    Relationship status: Not on file  Other Topics Concern  . Not on file  Social History Narrative   Lives at [facility], widowed 7 years, no dietary restrictions. Retired from neuropsychiatry work.     Outpatient Encounter Medications as of 11/11/2018  Medication Sig  . brimonidine (ALPHAGAN) 0.2 % ophthalmic solution Place 1 drop into both eyes 2 (two) times daily.  Marland Kitchen levothyroxine (SYNTHROID, LEVOTHROID) 112 MCG tablet Take 1 tablet (112 mcg total) by mouth daily before breakfast.  . losartan (COZAAR) 50 MG tablet TAKE ONE (1) TABLET BY MOUTH EVERY DAY  . Multiple Vitamins-Minerals (MULTIVITAMIN WITH MINERALS) tablet Take 1 tablet by mouth daily.  Marland Kitchen omeprazole (PRILOSEC) 20 MG capsule Take 1 capsule (20 mg total) by mouth daily.  . polyethylene glycol (MIRALAX / GLYCOLAX) packet  Take 17 g by mouth daily.  . ranitidine (ZANTAC) 300 MG tablet Take 1 tablet (300 mg total) by mouth at bedtime.  . Vitamin D, Ergocalciferol, (DRISDOL) 1.25 MG (50000 UT) CAPS capsule Take 1 capsule (50,000 Units total) by mouth every 7 (seven) days.  . Cholecalciferol (VITAMIN D) 2000 units CAPS Take 1 capsule (2,000 Units total) by mouth daily. (Patient not taking: Reported on 11/11/2018)  . fluticasone (FLONASE) 50 MCG/ACT nasal spray Place 2 sprays into both nostrils daily. (Patient not taking: Reported on 11/11/2018)  . levocetirizine (  XYZAL) 5 MG tablet Take 1 tablet (5 mg total) by mouth every evening. (Patient not taking: Reported on 11/11/2018)   No facility-administered encounter medications on file as of 11/11/2018.     Activities of Daily Living In your present state of health, do you have any difficulty performing the following activities: 11/11/2018  Hearing? Y  Comment wearing hearing aids.  Vision? N  Difficulty concentrating or making decisions? N  Walking or climbing stairs? N  Dressing or bathing? N  Doing errands, shopping? N  Preparing Food and eating ? N  Using the Toilet? N  In the past six months, have you accidently leaked urine? N  Do you have problems with loss of bowel control? N  Managing your Medications? N  Managing your Finances? N  Housekeeping or managing your Housekeeping? N  Some recent data might be hidden    Patient Care Team: [name], MD as PCP - General (Family Medicine)    Assessment:   This is a routine wellness examination for [place]. Physical assessment deferred to PCP.  Exercise Activities and Dietary recommendations Current Exercise Habits: Structured exercise class(walks in pool), Time (Minutes): 60, Frequency (Times/Week): 3, Weekly Exercise (Minutes/Week): 180, Intensity: Mild, Exercise limited by: None identified   Diet (meal preparation, eat out, water intake, caffeinated beverages, dairy products, fruits and vegetables):  well balanced, on average, 2 meals per day   Goals    . Patient Stated (pt-stated)     Eat healthier and continue with 3 days/week of exercise.       Fall Risk Fall Risk  11/11/2018 10/18/2017 06/09/2017  Falls in the past year? 0 No Yes  Number falls in past yr: - - 1  Injury with Fall? - - Yes  Risk for fall due to : - - Impaired balance/gait;Impaired mobility     Depression Screen PHQ 2/9 Scores 11/11/2018 05/30/2018 10/18/2017 06/09/2017  PHQ - 2 Score 0 4 0 1  PHQ- 9 Score - 6 - 8     Cognitive Function Ad8 score reviewed for issues:  Issues making decisions:no  Less interest in hobbies / activities: no  Repeats questions, stories (family complaining):no  Trouble using ordinary gadgets (microwave, computer, phone):no  Forgets the month or year: no  Mismanaging finances: no  Remembering appts:no  Daily problems with thinking and/or memory:no Ad8 score is=0   MMSE - Mini Mental State Exam 10/18/2017  Orientation to time 5  Orientation to Place 5  Registration 3  Attention/ Calculation 5  Recall 3  Language- name 2 objects 2  Language- repeat 1  Language- follow 3 step command 3  Language- read & follow direction 1  Write a sentence 1  Copy design 1  Total score 30        Immunization History  Administered Date(s) Administered  . Hepatitis A, Adult 02/17/2018  . Influenza, High Dose Seasonal PF 06/14/2017, 05/27/2018  . Influenza-Unspecified 06/12/2014, 06/24/2015  . Pneumococcal Conjugate-13 10/17/2013, 04/30/2015  . Pneumococcal Polysaccharide-23 09/14/1998, 09/14/2008, 01/28/2018  . Tetanus 10/17/2013  . Zoster Recombinat (Shingrix) 01/26/2017, 06/28/2017   Screening Tests Health Maintenance  Topic Date Due  . TETANUS/TDAP  10/18/2023  . INFLUENZA VACCINE  Completed  . DEXA SCAN  Completed  . PNA vac Low Risk Adult  Completed      Plan:    Please schedule your next medicare wellness visit with me in 1 yr.  Continue to eat heart healthy  diet (full of fruits, vegetables, whole grains, lean protein,  water--limit salt, fat, and sugar intake) and increase physical activity as tolerated.    I have personally reviewed and noted the following in the patient's chart:   . Medical and social history . Use of alcohol, tobacco or illicit drugs  . Current medications and supplements . Functional ability and status . Nutritional status . Physical activity . Advanced directives . List of other physicians . Hospitalizations, surgeries, and ER visits in previous 12 months . Vitals . Screenings to include cognitive, depression, and falls . Referrals and appointments  In addition, I have reviewed and discussed with patient certain preventive protocols, quality metrics, and best practice recommendations. A written personalized care plan for preventive services as well as general preventive health recommendations were provided to patient.     Shela Nevin, [place]  11/11/2018  Medical screening examination/treatment was performed by qualified clinical staff member and as supervising physician I was immediately available for consultation/collaboration. I have reviewed documentation and agree with assessment and plan.  Penni Homans, MD

## 2018-11-11 NOTE — Assessment & Plan Note (Signed)
On Levothyroxine, continue to monitor 

## 2018-11-11 NOTE — Patient Instructions (Addendum)
Bone density shows osteopenia, which is thinner than normal but not as bad as osteoporosis. Recommend calcium intake of 1200 to 1500 mg daily, divided into roughly 3 doses. Best source is the diet and a single dairy serving is about 500 mg, a supplement of calcium citrate once or twice daily to balance diet is fine if not getting enough in diet. Also need Vitamin D 2000 IU caps, 1 cap daily if not already taking vitamin D. Also recommend weight baring exercise on hips and upper body to keep bones strong   Preventive Care 83 Years and Older, Female Preventive care refers to lifestyle choices and visits with your health care provider that can promote health and wellness. What does preventive care include?  A yearly physical exam. This is also called an annual well check.  Dental exams once or twice a year.  Routine eye exams. Ask your health care provider how often you should have your eyes checked.  Personal lifestyle choices, including: ? Daily care of your teeth and gums. ? Regular physical activity. ? Eating a healthy diet. ? Avoiding tobacco and drug use. ? Limiting alcohol use. ? Practicing safe sex. ? Taking low-dose aspirin every day. ? Taking vitamin and mineral supplements as recommended by your health care provider. What happens during an annual well check? The services and screenings done by your health care provider during your annual well check will depend on your age, overall health, lifestyle risk factors, and family history of disease. Counseling Your health care provider may ask you questions about your:  Alcohol use.  Tobacco use.  Drug use.  Emotional well-being.  Home and relationship well-being.  Sexual activity.  Eating habits.  History of falls.  Memory and ability to understand (cognition).  Work and work environment.  Reproductive health.  Screening You may have the following tests or measurements:  Height, weight, and BMI.  Blood  pressure.  Lipid and cholesterol levels. These may be checked every 5 years, or more frequently if you are over 50 years old.  Skin check.  Lung cancer screening. You may have this screening every year starting at age 55 if you have a 30-pack-year history of smoking and currently smoke or have quit within the past 15 years.  Colorectal cancer screening. All adults should have this screening starting at age 50 and continuing until age 75. You will have tests every 1-10 years, depending on your results and the type of screening test. People at increased risk should start screening at an earlier age. Screening tests may include: ? Guaiac-based fecal occult blood testing. ? Fecal immunochemical test (FIT). ? Stool DNA test. ? Virtual colonoscopy. ? Sigmoidoscopy. During this test, a flexible tube with a tiny camera (sigmoidoscope) is used to examine your rectum and lower colon. The sigmoidoscope is inserted through your anus into your rectum and lower colon. ? Colonoscopy. During this test, a long, thin, flexible tube with a tiny camera (colonoscope) is used to examine your entire colon and rectum.  Hepatitis C blood test.  Hepatitis B blood test.  Sexually transmitted disease (STD) testing.  Diabetes screening. This is done by checking your blood sugar (glucose) after you have not eaten for a while (fasting). You may have this done every 1-3 years.  Bone density scan. This is done to screen for osteoporosis. You may have this done starting at age 65.  Mammogram. This may be done every 1-2 years. Talk to your health care provider about how often you should   have regular mammograms. Talk with your health care provider about your test results, treatment options, and if necessary, the need for more tests. Vaccines Your health care provider may recommend certain vaccines, such as:  Influenza vaccine. This is recommended every year.  Tetanus, diphtheria, and acellular pertussis (Tdap, Td)  vaccine. You may need a Td booster every 10 years.  Varicella vaccine. You may need this if you have not been vaccinated.  Zoster vaccine. You may need this after age 60.  Measles, mumps, and rubella (MMR) vaccine. You may need at least one dose of MMR if you were born in 1957 or later. You may also need a second dose.  Pneumococcal 13-valent conjugate (PCV13) vaccine. One dose is recommended after age 65.  Pneumococcal polysaccharide (PPSV23) vaccine. One dose is recommended after age 65.  Meningococcal vaccine. You may need this if you have certain conditions.  Hepatitis A vaccine. You may need this if you have certain conditions or if you travel or work in places where you may be exposed to hepatitis A.  Hepatitis B vaccine. You may need this if you have certain conditions or if you travel or work in places where you may be exposed to hepatitis B.  Haemophilus influenzae type b (Hib) vaccine. You may need this if you have certain conditions. Talk to your health care provider about which screenings and vaccines you need and how often you need them. This information is not intended to replace advice given to you by your health care provider. Make sure you discuss any questions you have with your health care provider. Document Released: 09/27/2015 Document Revised: 10/21/2017 Document Reviewed: 07/02/2015 Elsevier Interactive Patient Education  2019 [employer]  

## 2018-11-13 DIAGNOSIS — E2839 Other primary ovarian failure: Secondary | ICD-10-CM | POA: Insufficient documentation

## 2018-11-13 NOTE — Assessment & Plan Note (Signed)
Supplement and monitor 

## 2018-11-13 NOTE — Assessment & Plan Note (Signed)
Well controlled, no changes to meds. Encouraged heart healthy diet such as the DASH diet and exercise as tolerated.  °

## 2018-11-13 NOTE — Progress Notes (Signed)
Subjective:    Patient ID: Catherine Coleman, female    DOB: 01/27/1932, 85 y.o.   MRN: [id number]  No chief complaint on file.   HPI Patient is in today for follow up. No recent febrile illness or hospitalizations. No polyuria or polydipsia. She is following with healthy weight and wellness but is struggling to loose weight. She is maintaining a heart healthy diet. Denies CP/palp/SOB/HA/congestion/fevers/GI or GU c/o. Taking meds as prescribed  Past Medical History:  Diagnosis Date  . Arthritis   . Atrophic rhinitis 04/21/2016  . Constipation   . Decreased hearing   . Diverticulitis   . Dry eyes   . Fatigue   . Floaters in visual field   . Gall bladder disease   . GERD (gastroesophageal reflux disease)   . Glaucoma   . Hiatal hernia with gastroesophageal reflux   . History of blood transfusion    for Knee replacement  . Hyperlipidemia, mild 04/02/2016  . Hypertension   . Hypothyroidism   . Joint pain   . Migraine aura without headache   . Muscle pain   . Muscle stiffness   . Nasal congestion   . Obesity 04/02/2016  . Osteoarthritis   . Parotiditis 04/21/2016  . Vitamin D deficiency 04/02/2016    Past Surgical History:  Procedure Laterality Date  . APPENDECTOMY    . cataract surgery  2008  . CHOLECYSTECTOMY  2010  . JOINT REPLACEMENT Left    2012  . MASTOIDECTOMY    . POSTERIOR CERVICAL FUSION/FORAMINOTOMY N/A 04/27/2017   Procedure: CERVICAL FIVE-SIX  OPEN REDUCTION OF FRACTURE, CERVICAL THREE-SEVEN  DORSAL FIXATION AND FUSION;  Surgeon: Ditty, Kevan Ny, MD;  Location: [facility];  Service: Neurosurgery;  Laterality: N/A;  . SHOULDER ARTHROSCOPY Bilateral prior to 2010  . TONSILLECTOMY    . TOTAL HIP ARTHROPLASTY Right 03/22/2015   Procedure: RIGHT TOTAL HIP ARTHROPLASTY ANTERIOR APPROACH;  Surgeon: Dorna Leitz, MD;  Location: [place];  Service: Orthopedics;  Laterality: Right;  . TOTAL KNEE ARTHROPLASTY Bilateral 2007  . TUBAL LIGATION      Family History  Problem  Relation Age of Onset  . Dementia Mother   . Hypertension Mother   . Obesity Mother   . Heart disease Father   . Hypertension Father   . Stroke Father   . Diabetes Father   . Hyperlipidemia Father   . Alcoholism Father   . Obesity Father   . Arthritis Daughter   . Cancer Maternal Grandfather        colon cancer    Social History   Socioeconomic History  . Marital status: Widowed    Spouse name: Not on file  . Number of children: Not on file  . Years of education: Not on file  . Highest education level: Not on file  Occupational History  . Occupation: Retired  Scientific laboratory technician  . Financial resource strain: Not on file  . Food insecurity:    Worry: Not on file    Inability: Not on file  . Transportation needs:    Medical: Not on file    Non-medical: Not on file  Tobacco Use  . Smoking status: Former Research scientist (life sciences)  . Smokeless tobacco: Never Used  . Tobacco comment: Stopped 50 years ago.   Substance and Sexual Activity  . Alcohol use: Yes    Alcohol/week: 0.0 standard drinks    Comment: socially 3-4x/wk  . Drug use: No  . Sexual activity: Never  Lifestyle  . Physical activity:  Days per week: Not on file    Minutes per session: Not on file  . Stress: Not on file  Relationships  . Social connections:    Talks on phone: Not on file    Gets together: Not on file    Attends religious service: Not on file    Active member of club or organization: Not on file    Attends meetings of clubs or organizations: Not on file    Relationship status: Not on file  . Intimate partner violence:    Fear of current or ex partner: Not on file    Emotionally abused: Not on file    Physically abused: Not on file    Forced sexual activity: Not on file  Other Topics Concern  . Not on file  Social History Narrative   Lives at [facility], widowed 7 years, no dietary restrictions. Retired from neuropsychiatry work.     Outpatient Medications Prior to Visit  Medication Sig Dispense  Refill  . brimonidine (ALPHAGAN) 0.2 % ophthalmic solution Place 1 drop into both eyes 2 (two) times daily. 5 mL 12  . Cholecalciferol (VITAMIN D) 2000 units CAPS Take 1 capsule (2,000 Units total) by mouth daily. (Patient not taking: Reported on 11/11/2018) 30 capsule 5  . fluticasone (FLONASE) 50 MCG/ACT nasal spray Place 2 sprays into both nostrils daily. (Patient not taking: Reported on 11/11/2018) 16 g 6  . levocetirizine (XYZAL) 5 MG tablet Take 1 tablet (5 mg total) by mouth every evening. (Patient not taking: Reported on 11/11/2018) 30 tablet 5  . levothyroxine (SYNTHROID, LEVOTHROID) 112 MCG tablet Take 1 tablet (112 mcg total) by mouth daily before breakfast. 30 tablet 0  . losartan (COZAAR) 50 MG tablet TAKE ONE (1) TABLET BY MOUTH EVERY DAY 90 tablet 1  . Multiple Vitamins-Minerals (MULTIVITAMIN WITH MINERALS) tablet Take 1 tablet by mouth daily.    Marland Kitchen omeprazole (PRILOSEC) 20 MG capsule Take 1 capsule (20 mg total) by mouth daily. 90 capsule 1  . polyethylene glycol (MIRALAX / GLYCOLAX) packet Take 17 g by mouth daily. 14 each 0  . ranitidine (ZANTAC) 300 MG tablet Take 1 tablet (300 mg total) by mouth at bedtime. 90 tablet 1  . Vitamin D, Ergocalciferol, (DRISDOL) 1.25 MG (50000 UT) CAPS capsule Take 1 capsule (50,000 Units total) by mouth every 7 (seven) days. 4 capsule 0   No facility-administered medications prior to visit.     Allergies  Allergen Reactions  . Other Other (See Comments)    Seasonal allergies(pollen & grass)    Review of Systems  Constitutional: Positive for malaise/fatigue. Negative for fever.  HENT: Negative for congestion.   Eyes: Negative for blurred vision.  Respiratory: Negative for shortness of breath.   Cardiovascular: Negative for chest pain, palpitations and leg swelling.  Gastrointestinal: Negative for abdominal pain, blood in stool and nausea.  Genitourinary: Negative for dysuria and frequency.  Musculoskeletal: Negative for falls.  Skin:  Negative for rash.  Neurological: Negative for dizziness, loss of consciousness and headaches.  Endo/Heme/Allergies: Negative for environmental allergies.  Psychiatric/Behavioral: Negative for depression. The patient is not nervous/anxious.        Objective:    Physical Exam Vitals signs and nursing note reviewed.  Constitutional:      General: She is not in acute distress.    Appearance: She is well-developed.  HENT:     Head: Normocephalic and atraumatic.     Nose: Nose normal.  Eyes:     General:  Right eye: No discharge.        Left eye: No discharge.  Neck:     Musculoskeletal: Normal range of motion and neck supple.  Cardiovascular:     Rate and Rhythm: Normal rate and regular rhythm.     Heart sounds: No murmur.  Pulmonary:     Effort: Pulmonary effort is normal.     Breath sounds: Normal breath sounds.  Abdominal:     General: Bowel sounds are normal.     Palpations: Abdomen is soft.     Tenderness: There is no abdominal tenderness.  Skin:    General: Skin is warm and dry.  Neurological:     Mental Status: She is alert and oriented to person, place, and time.     BP 124/70 (BP Location: Left Arm, Patient Position: Sitting, Cuff Size: Normal)   Pulse 60   Temp 98.6 F (37 C) (Oral)   Resp 18   Wt 206 lb (93.4 kg)   SpO2 96%   BMI 32.26 kg/m  Wt Readings from Last 3 Encounters:  11/11/18 206 lb (93.4 kg)  11/11/18 206 lb 3.2 oz (93.5 kg)  11/07/18 201 lb (91.2 kg)     Lab Results  Component Value Date   WBC 5.4 01/28/2018   HGB 14.5 01/28/2018   HCT 43.0 01/28/2018   PLT 217.0 01/28/2018   GLUCOSE 111 (H) 09/28/2018   CHOL 226 (H) 09/28/2018   TRIG 159 (H) 09/28/2018   HDL 60 09/28/2018   LDLDIRECT 144.0 01/28/2018   LDLCALC 134 (H) 09/28/2018   ALT 13 09/28/2018   AST 17 09/28/2018   NA 137 09/28/2018   K 4.6 09/28/2018   CL 100 09/28/2018   CREATININE 0.66 09/28/2018   BUN 9 09/28/2018   CO2 18 (L) 09/28/2018   TSH 7.710 (H)  10/13/2018   INR 1.05 04/28/2017   HGBA1C 6.1 (H) 09/28/2018    Lab Results  Component Value Date   TSH 7.710 (H) 10/13/2018   Lab Results  Component Value Date   WBC 5.4 01/28/2018   HGB 14.5 01/28/2018   HCT 43.0 01/28/2018   MCV 95.6 01/28/2018   PLT 217.0 01/28/2018   Lab Results  Component Value Date   NA 137 09/28/2018   K 4.6 09/28/2018   CO2 18 (L) 09/28/2018   GLUCOSE 111 (H) 09/28/2018   BUN 9 09/28/2018   CREATININE 0.66 09/28/2018   BILITOT 0.3 09/28/2018   ALKPHOS 95 09/28/2018   AST 17 09/28/2018   ALT 13 09/28/2018   PROT 6.7 09/28/2018   ALBUMIN 4.6 09/28/2018   CALCIUM 9.2 09/28/2018   ANIONGAP 8 05/15/2017   GFR 89.13 01/28/2018   Lab Results  Component Value Date   CHOL 226 (H) 09/28/2018   Lab Results  Component Value Date   HDL 60 09/28/2018   Lab Results  Component Value Date   LDLCALC 134 (H) 09/28/2018   Lab Results  Component Value Date   TRIG 159 (H) 09/28/2018   Lab Results  Component Value Date   CHOLHDL 3.8 09/28/2018   Lab Results  Component Value Date   HGBA1C 6.1 (H) 09/28/2018       Assessment & Plan:   Problem List Items Addressed This Visit    Hypertension    Well controlled, no changes to meds. Encouraged heart healthy diet such as the DASH diet and exercise as tolerated.       Hypothyroidism    On Levothyroxine, continue to monitor  Obesity    Encouraged heart healthy diet, decrease po intake and increase exercise as tolerated. Needs 7-8 hours of sleep nightly. eat small, frequent meals every 4-5 hours with lean proteins, complex carbs and healthy fats. Minimize simple carbs, she is following with healthy weight and wellness.       Vitamin D deficiency    Supplement and monitor      Hyperlipidemia, mild    Encouraged heart healthy diet, increase exercise, avoid trans fats, consider a krill oil cap daily      Hyperglycemia    hgba1c acceptable, minimize simple carbs. Increase exercise as  tolerated.       Estrogen deficiency - Primary    Encouraged to get adequate exercise, calcium and vitamin d intake. Bone density test ordered.       Relevant Orders   DG Bone Density      I am having [name] maintain her multivitamin with minerals, brimonidine, Vitamin D, polyethylene glycol, fluticasone, levocetirizine, omeprazole, losartan, ranitidine, Vitamin D (Ergocalciferol), and levothyroxine.  No orders of the defined types were placed in this encounter.    Penni Homans, MD

## 2018-11-13 NOTE — Assessment & Plan Note (Signed)
Encouraged heart healthy diet, decrease po intake and increase exercise as tolerated. Needs 7-8 hours of sleep nightly. eat small, frequent meals every 4-5 hours with lean proteins, complex carbs and healthy fats. Minimize simple carbs, she is following with healthy weight and wellness.

## 2018-11-13 NOTE — Assessment & Plan Note (Signed)
Encouraged to get adequate exercise, calcium and vitamin d intake. Bone density test ordered.

## 2018-11-13 NOTE — Assessment & Plan Note (Signed)
Encouraged heart healthy diet, increase exercise, avoid trans fats, consider a krill oil cap daily 

## 2018-11-13 NOTE — Assessment & Plan Note (Signed)
hgba1c acceptable, minimize simple carbs. Increase exercise as tolerated.  

## 2018-12-01 ENCOUNTER — Other Ambulatory Visit: Payer: Self-pay | Admitting: Family Medicine

## 2018-12-08 ENCOUNTER — Encounter (INDEPENDENT_AMBULATORY_CARE_PROVIDER_SITE_OTHER): Payer: Self-pay

## 2018-12-12 ENCOUNTER — Ambulatory Visit (INDEPENDENT_AMBULATORY_CARE_PROVIDER_SITE_OTHER): Payer: Medicare Other | Admitting: Family Medicine

## 2018-12-20 ENCOUNTER — Other Ambulatory Visit: Payer: Self-pay | Admitting: Family Medicine

## 2019-02-03 ENCOUNTER — Encounter: Payer: Self-pay | Admitting: Family Medicine

## 2019-03-07 ENCOUNTER — Other Ambulatory Visit: Payer: Self-pay | Admitting: Family Medicine

## 2019-03-27 NOTE — Progress Notes (Signed)
Catherine Coleman - 85 y.o. female MRN [id number]  Date of birth: [birth year]  Office Visit Note: Visit Date: 06/24/2018 PCP: [name], MD Referred by: [name], MD  Subjective: Chief Complaint  Patient presents with  . Right Shoulder - Pain  . Right Arm - Tingling  . Left Arm - Tingling  . Spine - Pain   HPI: Catherine Coleman is a 85 y.o. female who comes in today The request of her primary care physician Dr. Willette Alma for evaluation management of what appears to be right shoulder blade pain with some paresthesia feeling in both arms.  She reports that this started several months ago without any known injury or precipitating event.  She reports her average pain is a 9 out of 10 but her pain right now is 4 out of 10.  She has been undergoing some physical therapy and she feels like this has helped.  She reports it has not helped very much but she is hopeful.  She denies any paresthesias into the hands.  She denies any specific nocturnal complaints or fever chills or night sweats or other red flag symptoms.  No focal weakness.  She has a pretty complicated cervical spine history with prior MRI imaging in 2018 which is reviewed with the patient and reviewed below.  She underwent cervical fusion from C4-C7 by Dr. Cyndy Freeze at [facility].  Unfortunately he is no longer with our practice.  She has had recent x-ray imaging and this is also reviewed.  She has not had any recent postsurgical MRI.  She has been using normal over-the-counter medications.  She is been using heat and ice.  The pain is intermittent stabbing with this tingling sensation worse with bending and moving her head.  She has had no associated headaches or blurry vision etc.  No real lumbar pain.  Review of Systems  Constitutional: Negative for chills, fever, malaise/fatigue and weight loss.  HENT: Negative for hearing loss and sinus pain.   Eyes: Negative for blurred vision, double  vision and photophobia.  Respiratory: Negative for cough and shortness of breath.   Cardiovascular: Negative for chest pain, palpitations and leg swelling.  Gastrointestinal: Negative for abdominal pain, nausea and vomiting.  Genitourinary: Negative for flank pain.  Musculoskeletal: Positive for neck pain. Negative for myalgias.       Right shoulder blade pain and some paresthesia in both arms.  Skin: Negative for itching and rash.  Neurological: Positive for tingling. Negative for tremors, focal weakness and weakness.  Endo/Heme/Allergies: Negative.   Psychiatric/Behavioral: Negative for depression.  All other systems reviewed and are negative.  Otherwise per HPI.  Assessment & Plan: Visit Diagnoses:  1. Pain of right scapula   2. Chronic right-sided thoracic back pain   3. Cervicalgia   4. Post laminectomy syndrome   5. Paresthesia of skin     Plan: Findings:  Chronic several month history of right shoulder blade pain with an itchy tingling sensation in both arms occasionally status post cervical multilevel fusion in 2018.  She has just started undergoing physical therapy and is not sure if this is helping but is hopeful.  Exam consistent with some myofascial pain but clearly could be related to more of a C5 or C6 nerve root irritation but this is clearly in the area of where she had the prior fusion.  At this point without any red flag symptoms I think it is fair to see how physical therapy does  then there is a myofascial component to this with positive trigger point in the rhomboids.  Depending on how they do over the next couple of weeks we could look at cervical spine MRI imaging.  Depending on that could look at cervical epidural versus facet joint block etc.  There is also some adjunct of medication treatment that could be done as well and we discussed a lot of this at length.  We will see her back depending on how physical therapy does.    Meds & Orders: No orders of the defined  types were placed in this encounter.  No orders of the defined types were placed in this encounter.   Follow-up: Return if symptoms worsen or fail to improve, for Consider cervical spine MRI.   Procedures: No procedures performed  No notes on file   Clinical History: CERVICAL SPINE - COMPLETE 4+ VIEW  COMPARISON: Intraoperative images 04/27/2017. MRI 04/26/2017  FINDINGS: Bilateral posterior screw and rod fixation at C4, C5, C6 and C7. Extensive facet arthropathy at C3-C4 particularly along the right side. Bony encroachment of the left foramen at C6-C7. Bony encroachment of the right foramen at C3-C4. Disc space narrowing from C4 through C7. Normal alignment at the cervicothoracic junction. Vertebral body heights are maintained. Prevertebral soft tissues are within normal limits. Stable appearance of the surgical hardware.  IMPRESSION: Posterior cervical fusion at C4-C7.  Prominent facet disease at C3-C4. Bony encroachment of the right foramen at C3-C4.  Bony encroachment of the left foramen at C6-C7.   Electronically Signed By: Markus Daft M.D. On: 05/27/2018 17:29  MRI CERVICAL SPINE WITHOUT CONTRAST  TECHNIQUE: Multiplanar, multisequence MR imaging of the cervical spine was performed. No intravenous contrast was administered.  COMPARISON:  CT cervical spine 04/26/2017  FINDINGS: Alignment: Grade 1 C5-6 anterolisthesis and C6-7 retrolisthesis. There is a perched right C5-C6 facet, unchanged. There is disruption of the ligamentum flavum at the C5-C6 levels. There is also focal discontinuity of the posterior longitudinal ligament at the level of the C5-6 disc. There is extensive edema throughout the posterior paraspinal soft tissues and within the interspinous ligament.  Vertebrae: Fracture of the right C5 transverse process is better characterized on concomitant CT. No other fracture identified.  Cord: No focal cord signal abnormality. No evidence of  cord hemorrhage.  Posterior Fossa, vertebral arteries, paraspinal tissues: Normal posterior fossa. There is narrowing of the right vertebral artery flow void at the C5-6 level, better characterized on the concomitant CTA. As above, there is extensive edema within the paraspinous musculature posteriorly. There is a small amounts of fluid along the anterior and posterior surfaces of the vertebral column without space-occupying collection.  Disc levels:  C1-C2: Normal.  C2-C3: Normal disc space and facets. No spinal canal or neuroforaminal stenosis.  C3-C4: Moderate bilateral facet hypertrophy. Moderate bilateral foraminal stenosis. No spinal canal stenosis.  C4-C5: Left-greater-than-right facet hypertrophy. Mild left foraminal stenosis.  C5-C6: Moderate narrowing of the spinal canal due to posteriorly displaced C6 vertebral body with the posterior aspect of the superior C6 endplate effacing the ventral thecal sac and mildly flattening the anterior spinal cord.  C6-C7: Grade 1 retrolisthesis. The posterior aspect of the inferior C6 endplate provides mild mass effect on the ventral thecal sac. No spinal canal stenosis. No neural foraminal stenosis.  C7-T1: Normal disc space and facets. No spinal canal or neuroforaminal stenosis.  IMPRESSION: 1. Unstable cervical spine injury with right C5-C6 perched facet and associated grade 1 C5-6 anterolisthesis and C6-7 retrolisthesis. 2. Acute tear  of the ligamentum flavum at the C6-7 level, with tear versus strain of the posterior longitudinal ligament. 3. Extensive posterior paraspinous muscle edema and interspinous ligament injury. 4. No spinal cord signal abnormality. Moderate spinal canal stenosis at C5-6 due to posterior displacement of the superior C6 endplate, narrowing the ventral thecal sac and mildly flattening the anterior surface of the spinal cord.   Electronically Signed   By: Ulyses Jarred M.D.   On:  04/26/2017 22:18   She reports that she has quit smoking. She has never used smokeless tobacco.  Recent Labs    05/30/18 1031 09/28/18 1147  HGBA1C 6.2* 6.1*    Objective:  VS:  HT:5\' 7"  (170.2 cm)   WT:205 lb (93 kg)  BMI:32.1    BP:(!) 146/89  HR:63bpm  TEMP: ( )  RESP:96 % Physical Exam Vitals signs and nursing note reviewed.  Constitutional:      General: She is not in acute distress.    Appearance: Normal appearance. She is well-developed.  HENT:     Head: Normocephalic and atraumatic.     Nose: Nose normal.     Mouth/Throat:     Mouth: Mucous membranes are moist.     Pharynx: Oropharynx is clear.  Eyes:     Conjunctiva/sclera: Conjunctivae normal.     Pupils: Pupils are equal, round, and reactive to light.  Neck:     Musculoskeletal: Neck rigidity present. No muscular tenderness.  Cardiovascular:     Rate and Rhythm: Regular rhythm.  Pulmonary:     Effort: Pulmonary effort is normal. No respiratory distress.  Abdominal:     General: There is no distension.     Palpations: Abdomen is soft.     Tenderness: There is no guarding.  Musculoskeletal:     Right lower leg: No edema.     Left lower leg: No edema.     Comments: Patient sits with forward flexed cervical spine.  She is somewhat stiff to rotation left and right.  She gets some pain really more with left rotation in the neck than right.  She has active trigger points in the rhomboids and infraspinatus on the right shoulder blade.  She has some impingement of both shoulders with rotation externally.  She has no pain over the [facility] joints.  She has good strength in the upper extremities bilaterally with a negative Hoffmann's test bilaterally.  She has intact sensation.  She has negative Phalen's bilaterally.  Lymphadenopathy:     Cervical: No cervical adenopathy.  Skin:    General: Skin is warm and dry.     Findings: No erythema or rash.  Neurological:     General: No focal deficit present.     Mental Status:  She is alert and oriented to person, place, and time.     Motor: No abnormal muscle tone.     Coordination: Coordination normal.     Gait: Gait normal.  Psychiatric:        Mood and Affect: Mood normal.        Behavior: Behavior normal.        Thought Content: Thought content normal.     Ortho Exam Imaging: No results found.  Past Medical/Family/Surgical/Social History: Medications & Allergies reviewed per EMR, new medications updated. Patient Active Problem List   Diagnosis Date Noted  . Estrogen deficiency 11/13/2018  . . 08/25/2018  . Prediabetes 06/14/2018  . Low back pain 06/12/2018  . Hyperglycemia 01/28/2018  . Recurrent sinusitis 10/24/2017  . Urinary  incontinence 10/18/2017  . Physical deconditioning 10/18/2017  . History of cervical fracture 06/09/2017  . Hypoxia 05/14/2017  . Sternal fracture 04/30/2017  . Parotiditis 04/21/2016  . Breast cancer screening 04/21/2016  . Atrophic rhinitis 04/21/2016  . Obesity 04/02/2016  . Diverticulosis of colon without hemorrhage 04/02/2016  . Vitamin D deficiency 04/02/2016  . Hyperlipidemia, mild 04/02/2016  . Hypertension   . Arthritis   . Hiatal hernia with gastroesophageal reflux   . Glaucoma   . Hypothyroidism   . Primary osteoarthritis of right hip 03/22/2015   Past Medical History:  Diagnosis Date  . Arthritis   . Atrophic rhinitis 04/21/2016  . Constipation   . Decreased hearing   . Diverticulitis   . Dry eyes   . Fatigue   . Floaters in visual field   . Gall bladder disease   . GERD (gastroesophageal reflux disease)   . Glaucoma   . Hiatal hernia with gastroesophageal reflux   . History of blood transfusion    for Knee replacement  . Hyperlipidemia, mild 04/02/2016  . Hypertension   . Hypothyroidism   . Joint pain   . Migraine aura without headache   . Muscle pain   . Muscle stiffness   . Nasal congestion   . Obesity 04/02/2016  . Osteoarthritis   . Parotiditis 04/21/2016  . Vitamin D deficiency  04/02/2016   Family History  Problem Relation Age of Onset  . Dementia Mother   . Hypertension Mother   . Obesity Mother   . Heart disease Father   . Hypertension Father   . Stroke Father   . Diabetes Father   . Hyperlipidemia Father   . Alcoholism Father   . Obesity Father   . Arthritis Daughter   . Cancer Maternal Grandfather        colon cancer   Past Surgical History:  Procedure Laterality Date  . APPENDECTOMY    . cataract surgery  2008  . CHOLECYSTECTOMY  2010  . JOINT REPLACEMENT Left    2012  . MASTOIDECTOMY    . POSTERIOR CERVICAL FUSION/FORAMINOTOMY N/A 04/27/2017   Procedure: CERVICAL FIVE-SIX  OPEN REDUCTION OF FRACTURE, CERVICAL THREE-SEVEN  DORSAL FIXATION AND FUSION;  Surgeon: Ditty, Kevan Ny, MD;  Location: [place];  Service: Neurosurgery;  Laterality: N/A;  . SHOULDER ARTHROSCOPY Bilateral prior to 2010  . TONSILLECTOMY    . TOTAL HIP ARTHROPLASTY Right 03/22/2015   Procedure: RIGHT TOTAL HIP ARTHROPLASTY ANTERIOR APPROACH;  Surgeon: Dorna Leitz, MD;  Location: [place];  Service: Orthopedics;  Laterality: Right;  . TOTAL KNEE ARTHROPLASTY Bilateral 2007  . TUBAL LIGATION     Social History   Occupational History  . Occupation: Retired  Tobacco Use  . Smoking status: Former Research scientist (life sciences)  . Smokeless tobacco: Never Used  . Tobacco comment: Stopped 50 years ago.   Substance and Sexual Activity  . Alcohol use: Yes    Alcohol/week: 0.0 standard drinks    Comment: socially 3-4x/wk  . Drug use: No  . Sexual activity: Never

## 2019-04-20 DIAGNOSIS — S90852A Superficial foreign body, left foot, initial encounter: Secondary | ICD-10-CM | POA: Diagnosis not present

## 2019-04-20 DIAGNOSIS — Z5189 Encounter for other specified aftercare: Secondary | ICD-10-CM | POA: Diagnosis not present

## 2019-05-03 ENCOUNTER — Telehealth: Payer: Self-pay | Admitting: Family Medicine

## 2019-05-03 DIAGNOSIS — E038 Other specified hypothyroidism: Secondary | ICD-10-CM

## 2019-05-03 DIAGNOSIS — R7303 Prediabetes: Secondary | ICD-10-CM

## 2019-05-03 DIAGNOSIS — E785 Hyperlipidemia, unspecified: Secondary | ICD-10-CM

## 2019-05-03 DIAGNOSIS — I10 Essential (primary) hypertension: Secondary | ICD-10-CM

## 2019-05-03 DIAGNOSIS — R739 Hyperglycemia, unspecified: Secondary | ICD-10-CM

## 2019-05-03 DIAGNOSIS — E559 Vitamin D deficiency, unspecified: Secondary | ICD-10-CM

## 2019-05-03 NOTE — Telephone Encounter (Signed)
Please repeat the same labs fro 09/28/2018

## 2019-05-03 NOTE — Telephone Encounter (Signed)
She is scheduled for 6 month follow up on 9/15.  Was not sure what you would like ordered it looks like she has been seeing healthy weight and wellness.

## 2019-05-03 NOTE — Telephone Encounter (Signed)
Patient requesting labs to be ordered ahead of visit on 05/30/19. Please advise

## 2019-05-04 NOTE — Telephone Encounter (Signed)
Labs put in and appt made

## 2019-05-04 NOTE — Addendum Note (Signed)
Addended by: Kem Boroughs D on: 05/04/2019 10:46 AM   Modules accepted: Orders

## 2019-05-11 ENCOUNTER — Encounter: Payer: Self-pay | Admitting: Family Medicine

## 2019-05-12 ENCOUNTER — Ambulatory Visit: Payer: Medicare Other | Admitting: Family Medicine

## 2019-05-15 ENCOUNTER — Other Ambulatory Visit: Payer: Self-pay

## 2019-05-15 ENCOUNTER — Other Ambulatory Visit (INDEPENDENT_AMBULATORY_CARE_PROVIDER_SITE_OTHER): Payer: Medicare Other

## 2019-05-15 DIAGNOSIS — I10 Essential (primary) hypertension: Secondary | ICD-10-CM

## 2019-05-15 DIAGNOSIS — E559 Vitamin D deficiency, unspecified: Secondary | ICD-10-CM

## 2019-05-15 DIAGNOSIS — E785 Hyperlipidemia, unspecified: Secondary | ICD-10-CM

## 2019-05-15 DIAGNOSIS — R7303 Prediabetes: Secondary | ICD-10-CM | POA: Diagnosis not present

## 2019-05-15 DIAGNOSIS — E038 Other specified hypothyroidism: Secondary | ICD-10-CM | POA: Diagnosis not present

## 2019-05-15 LAB — COMPREHENSIVE METABOLIC PANEL
ALT: 24 U/L (ref 0–35)
AST: 23 U/L (ref 0–37)
Albumin: 4.6 g/dL (ref 3.5–5.2)
Alkaline Phosphatase: 98 U/L (ref 39–117)
BUN: 14 mg/dL (ref 6–23)
CO2: 26 mEq/L (ref 19–32)
Calcium: 9.9 mg/dL (ref 8.4–10.5)
Chloride: 97 mEq/L (ref 96–112)
Creatinine, Ser: 0.66 mg/dL (ref 0.40–1.20)
GFR: 85.07 mL/min (ref >60.00)
Glucose, Bld: 90 mg/dL (ref 70–99)
Potassium: 4.6 mEq/L (ref 3.5–5.1)
Sodium: 133 mEq/L — ABNORMAL LOW (ref 135–145)
Total Bilirubin: 0.4 mg/dL (ref 0.2–1.2)
Total Protein: 6.9 g/dL (ref 6.0–8.3)

## 2019-05-15 LAB — HEMOGLOBIN A1C: Hgb A1c MFr Bld: 6.3 % (ref 4.6–6.5)

## 2019-05-15 LAB — TSH: TSH: 7.1 u[IU]/mL — ABNORMAL HIGH (ref 0.35–4.50)

## 2019-05-15 LAB — LIPID PANEL
Cholesterol: 227 mg/dL — ABNORMAL HIGH (ref 0–200)
HDL: 52.5 mg/dL (ref >39.00)
NonHDL: 174.58
Total CHOL/HDL Ratio: 4
Triglycerides: 246 mg/dL — ABNORMAL HIGH (ref 0.0–149.0)
VLDL: 49.2 mg/dL — ABNORMAL HIGH (ref 0.0–40.0)

## 2019-05-15 LAB — T4, FREE: Free T4: 1.21 ng/dL (ref 0.60–1.60)

## 2019-05-15 LAB — LDL CHOLESTEROL, DIRECT: Direct LDL: 138 mg/dL

## 2019-05-17 IMAGING — MG DIGITAL SCREENING BILATERAL MAMMOGRAM WITH TOMO AND CAD
6 of 10 series · 6 of 30 positions shown · non-contrast
Comparison: Previous exam(s).

CLINICAL DATA: Screening.

EXAM:
DIGITAL SCREENING BILATERAL MAMMOGRAM WITH TOMO AND CAD

[R MLO synth-2D]
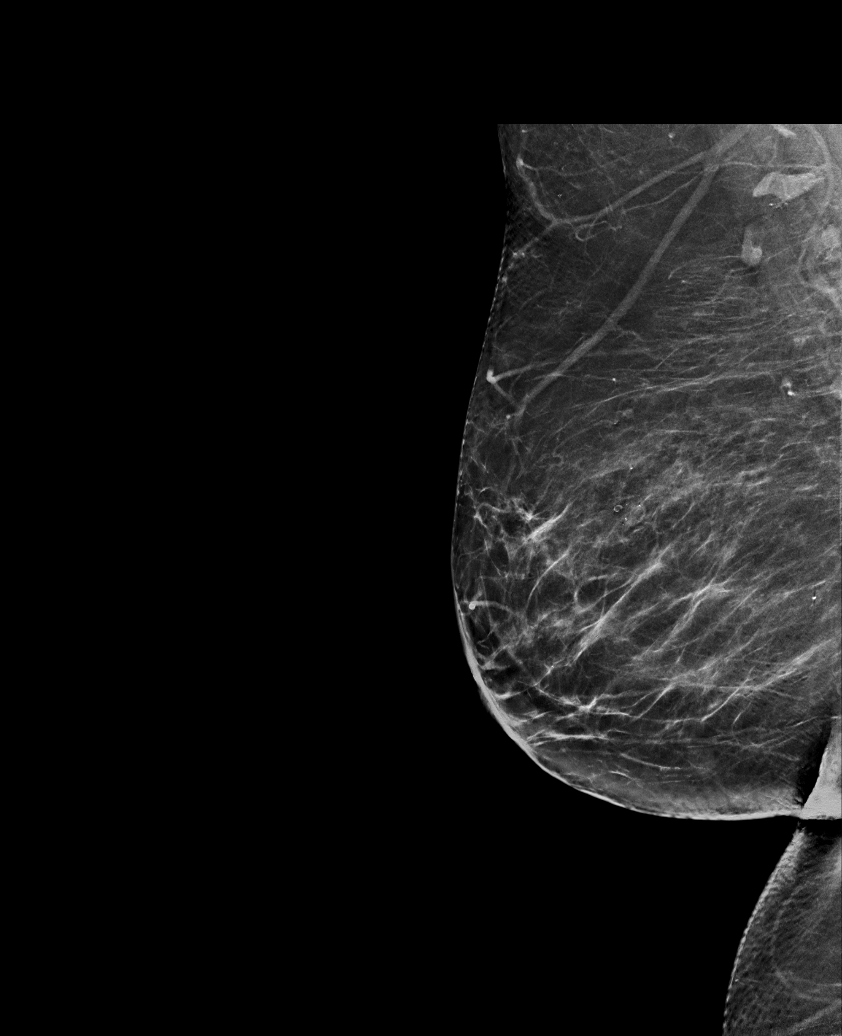

[L MLO synth-2D]
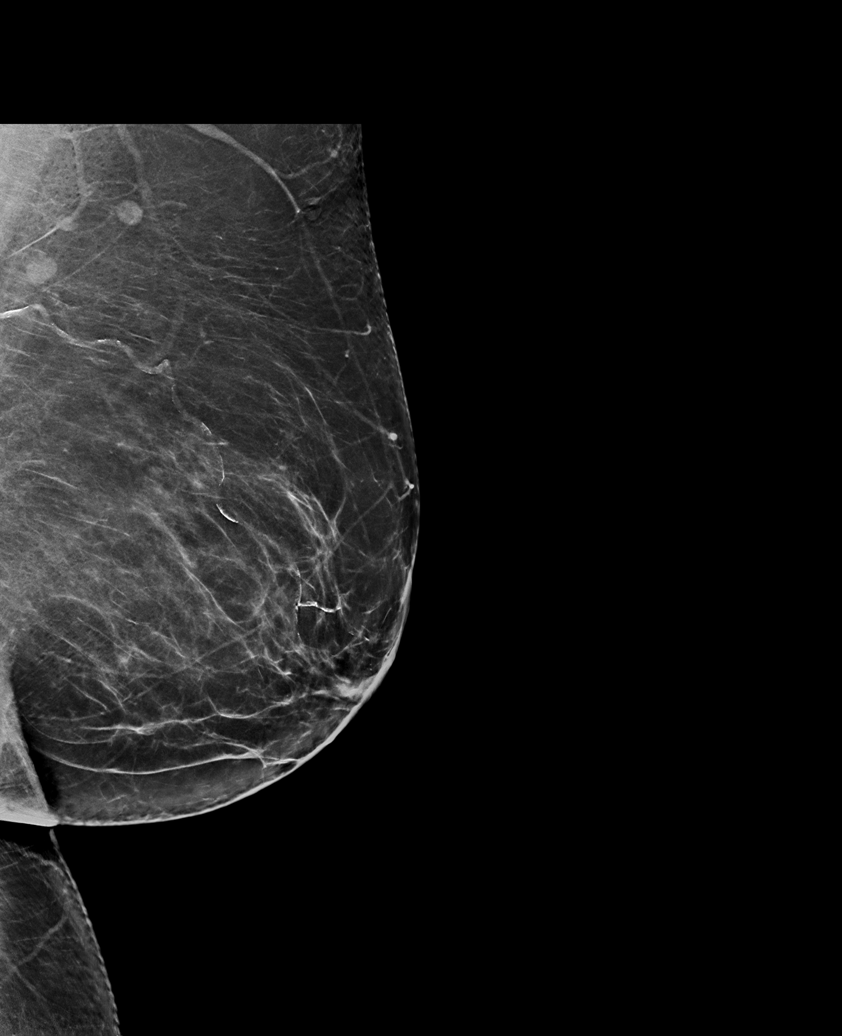

[L CC synth-2D]
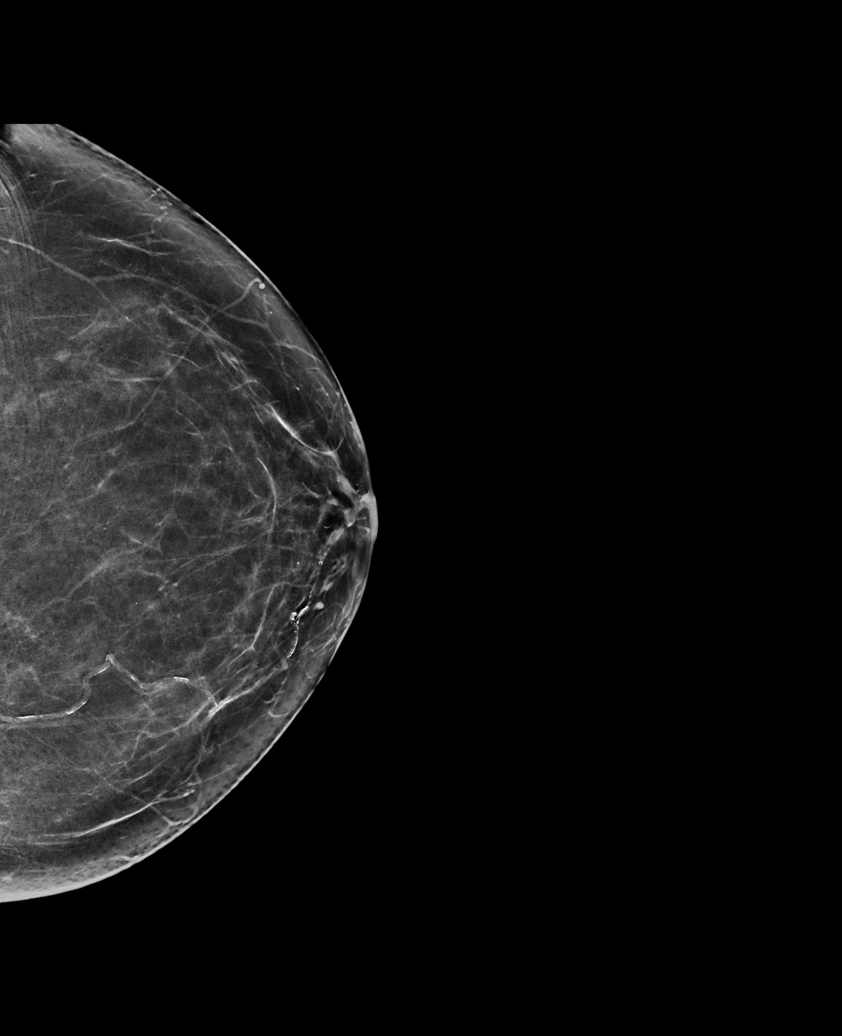

[R XCCL synth-2D]
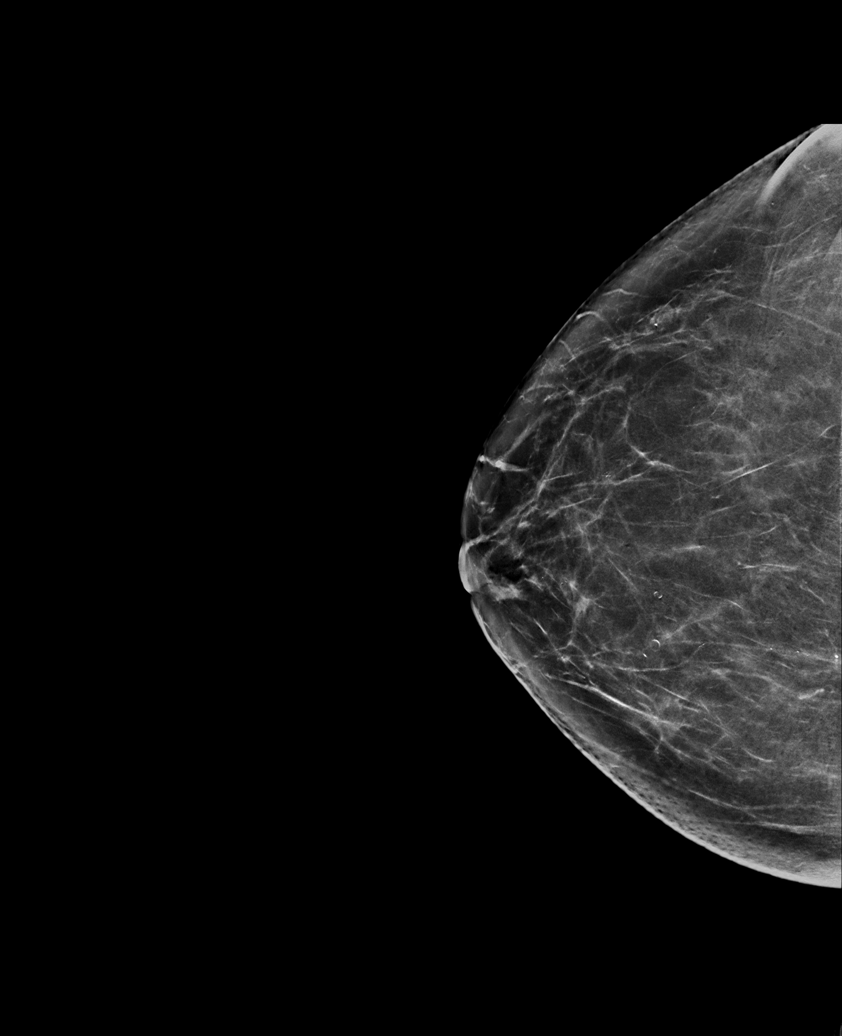

[R CC synth-2D]
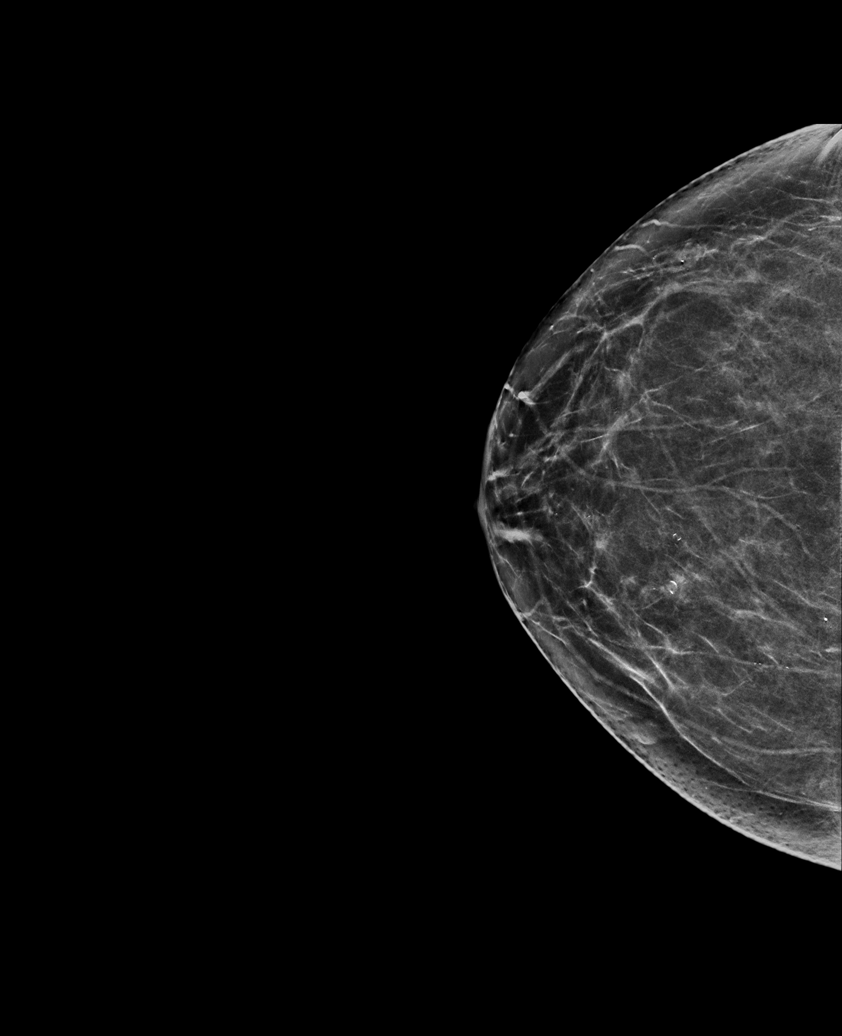

[R XCCL tomo · tomo slice 38/75.0]
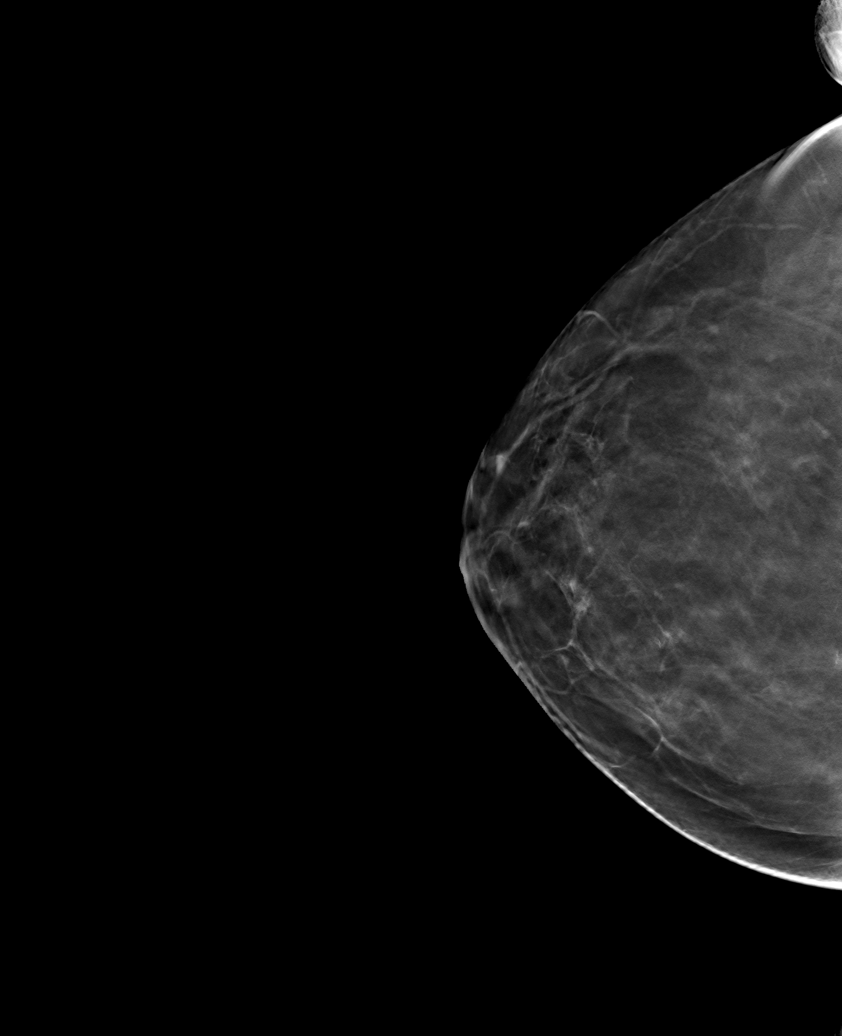

[6 of 30 positions shown; findings below may reference images not displayed]

ACR Breast Density Category b: There are scattered areas of
fibroglandular density.
FINDINGS: There are no findings suspicious for malignancy. Images were
processed with CAD.
IMPRESSION: No mammographic evidence of malignancy. A result letter of this
screening mammogram will be mailed directly to the patient.

RECOMMENDATION:
Screening mammogram in one year. (Code:[id number])

BI-RADS CATEGORY  1: Negative.

## 2019-05-18 LAB — VITAMIN D 1,25 DIHYDROXY
Vitamin D 1, 25 (OH)2 Total: 54 pg/mL (ref 18–72)
Vitamin D2 1, 25 (OH)2: <8 pg/mL
Vitamin D3 1, 25 (OH)2: 54 pg/mL

## 2019-05-18 LAB — T3: T3, Total: 95 ng/dL (ref 76–181)

## 2019-05-19 ENCOUNTER — Other Ambulatory Visit: Payer: Self-pay | Admitting: *Deleted

## 2019-05-19 DIAGNOSIS — E038 Other specified hypothyroidism: Secondary | ICD-10-CM

## 2019-05-19 MED ORDER — LEVOTHYROXINE SODIUM 112 MCG PO TABS: 112.0000 ug | ORAL_TABLET | Freq: Every day | ORAL | 5 refills | Status: DC

## 2019-05-26 ENCOUNTER — Other Ambulatory Visit: Payer: Self-pay

## 2019-05-30 ENCOUNTER — Encounter: Payer: Self-pay | Admitting: Family Medicine

## 2019-05-30 ENCOUNTER — Ambulatory Visit (INDEPENDENT_AMBULATORY_CARE_PROVIDER_SITE_OTHER): Payer: Medicare Other | Admitting: Family Medicine

## 2019-05-30 ENCOUNTER — Other Ambulatory Visit: Payer: Self-pay

## 2019-05-30 VITALS — BP 130/86 | HR 70 | Temp 96.8°F | Resp 18 | Wt 219.4 lb

## 2019-05-30 DIAGNOSIS — L578 Other skin changes due to chronic exposure to nonionizing radiation: Secondary | ICD-10-CM | POA: Diagnosis not present

## 2019-05-30 DIAGNOSIS — Z23 Encounter for immunization: Secondary | ICD-10-CM

## 2019-05-30 DIAGNOSIS — E038 Other specified hypothyroidism: Secondary | ICD-10-CM

## 2019-05-30 DIAGNOSIS — E559 Vitamin D deficiency, unspecified: Secondary | ICD-10-CM | POA: Diagnosis not present

## 2019-05-30 DIAGNOSIS — K449 Diaphragmatic hernia without obstruction or gangrene: Secondary | ICD-10-CM | POA: Diagnosis not present

## 2019-05-30 DIAGNOSIS — K219 Gastro-esophageal reflux disease without esophagitis: Secondary | ICD-10-CM

## 2019-05-30 DIAGNOSIS — R739 Hyperglycemia, unspecified: Secondary | ICD-10-CM | POA: Diagnosis not present

## 2019-05-30 DIAGNOSIS — I10 Essential (primary) hypertension: Secondary | ICD-10-CM | POA: Diagnosis not present

## 2019-05-30 DIAGNOSIS — E785 Hyperlipidemia, unspecified: Secondary | ICD-10-CM

## 2019-05-30 MED ORDER — FAMOTIDINE 40 MG PO TABS: 40.0000 mg | ORAL_TABLET | Freq: Every day | ORAL | 1 refills | Status: DC

## 2019-05-30 NOTE — Patient Instructions (Signed)
Pulse oximeter want oxygen levels in the 90s  Weekly vitals, blood pressure, pulse. Temperature, weight  Multivitamin with minerals, vitamin D, vitamin c, selenium and zinc

## 2019-06-02 DIAGNOSIS — L578 Other skin changes due to chronic exposure to nonionizing radiation: Secondary | ICD-10-CM | POA: Insufficient documentation

## 2019-06-02 NOTE — Assessment & Plan Note (Signed)
Encouraged heart healthy diet, increase exercise, avoid trans fats, consider a krill oil cap daily 

## 2019-06-02 NOTE — Assessment & Plan Note (Addendum)
hgba1c acceptable, minimize simple carbs. Increase exercise as tolerated. Given flu shot today 

## 2019-06-02 NOTE — Assessment & Plan Note (Signed)
Referred to dermatology for further consideration.  

## 2019-06-02 NOTE — Assessment & Plan Note (Signed)
On Levothyroxine, continue to monitor 

## 2019-06-02 NOTE — Assessment & Plan Note (Signed)
Avoid offending foods, start probiotics. Do not eat large meals in late evening and consider raising head of bed. Started Famotidine 40 mg qhs

## 2019-06-02 NOTE — Progress Notes (Signed)
Subjective:    Patient ID: Catherine Coleman, female    DOB: December 24, 1931, 85 y.o.   MRN: [id number]  No chief complaint on file.   HPI Patient is in today for follow up on chronic medical concerns including reflux, hypothyroidism, hyperglycemia and more. She has had some flares in her reflux and needs a replacement for her Ranitidine. No recent febrile illness or hospitalizations. She is maintaining quarantine well. Staying as active as able and maintaining a heart healthy diet. Denies CP/palp/SOB/HA/congestion/fevers/GI or GU c/o. Taking meds as prescribed  Past Medical History:  Diagnosis Date  . Arthritis   . Atrophic rhinitis 04/21/2016  . Constipation   . Decreased hearing   . Diverticulitis   . Dry eyes   . Fatigue   . Floaters in visual field   . Gall bladder disease   . GERD (gastroesophageal reflux disease)   . Glaucoma   . Hiatal hernia with gastroesophageal reflux   . History of blood transfusion    for Knee replacement  . Hyperlipidemia, mild 04/02/2016  . Hypertension   . Hypothyroidism   . Joint pain   . Migraine aura without headache   . Muscle pain   . Muscle stiffness   . Nasal congestion   . Obesity 04/02/2016  . Osteoarthritis   . Parotiditis 04/21/2016  . Vitamin D deficiency 04/02/2016    Past Surgical History:  Procedure Laterality Date  . APPENDECTOMY    . cataract surgery  2008  . CHOLECYSTECTOMY  2010  . JOINT REPLACEMENT Left    2012  . MASTOIDECTOMY    . POSTERIOR CERVICAL FUSION/FORAMINOTOMY N/A 04/27/2017   Procedure: CERVICAL FIVE-SIX  OPEN REDUCTION OF FRACTURE, CERVICAL THREE-SEVEN  DORSAL FIXATION AND FUSION;  Surgeon: Ditty, Kevan Ny, MD;  Location: [facility];  Service: Neurosurgery;  Laterality: N/A;  . SHOULDER ARTHROSCOPY Bilateral prior to 2010  . TONSILLECTOMY    . TOTAL HIP ARTHROPLASTY Right 03/22/2015   Procedure: RIGHT TOTAL HIP ARTHROPLASTY ANTERIOR APPROACH;  Surgeon: Dorna Leitz, MD;  Location: [facility];  Service: Orthopedics;   Laterality: Right;  . TOTAL KNEE ARTHROPLASTY Bilateral 2007  . TUBAL LIGATION      Family History  Problem Relation Age of Onset  . Dementia Mother   . Hypertension Mother   . Obesity Mother   . Heart disease Father   . Hypertension Father   . Stroke Father   . Diabetes Father   . Hyperlipidemia Father   . Alcoholism Father   . Obesity Father   . Arthritis Daughter   . Cancer Maternal Grandfather        colon cancer    Social History   Socioeconomic History  . Marital status: Widowed    Spouse name: Not on file  . Number of children: Not on file  . Years of education: Not on file  . Highest education level: Not on file  Occupational History  . Occupation: Retired  Scientific laboratory technician  . Financial resource strain: Not on file  . Food insecurity    Worry: Not on file    Inability: Not on file  . Transportation needs    Medical: Not on file    Non-medical: Not on file  Tobacco Use  . Smoking status: Former Research scientist (life sciences)  . Smokeless tobacco: Never Used  . Tobacco comment: Stopped 50 years ago.   Substance and Sexual Activity  . Alcohol use: Yes    Alcohol/week: 0.0 standard drinks    Comment: socially 3-4x/wk  .  Drug use: No  . Sexual activity: Never  Lifestyle  . Physical activity    Days per week: Not on file    Minutes per session: Not on file  . Stress: Not on file  Relationships  . Social Herbalist on phone: Not on file    Gets together: Not on file    Attends religious service: Not on file    Active member of club or organization: Not on file    Attends meetings of clubs or organizations: Not on file    Relationship status: Not on file  . Intimate partner violence    Fear of current or ex partner: Not on file    Emotionally abused: Not on file    Physically abused: Not on file    Forced sexual activity: Not on file  Other Topics Concern  . Not on file  Social History Narrative   Lives at [facility], widowed 7 years, no dietary restrictions.  Retired from neuropsychiatry work.     Outpatient Medications Prior to Visit  Medication Sig Dispense Refill  . brimonidine (ALPHAGAN) 0.2 % ophthalmic solution Place 1 drop into both eyes 2 (two) times daily. 5 mL 12  . levothyroxine (SYNTHROID) 112 MCG tablet Take 1 tablet (112 mcg total) by mouth daily before breakfast. 30 tablet 5  . losartan (COZAAR) 50 MG tablet TAKE 1 TABLET BY MOUTH DAILY 90 tablet 1  . Multiple Vitamins-Minerals (MULTIVITAMIN WITH MINERALS) tablet Take 1 tablet by mouth daily.    Marland Kitchen omeprazole (PRILOSEC) 20 MG capsule TAKE ONE (1) CAPSULE EACH DAY BY MOUTH 90 capsule 1  . polyethylene glycol (MIRALAX / GLYCOLAX) packet Take 17 g by mouth daily. 14 each 0  . Vitamin D, Ergocalciferol, (DRISDOL) 1.25 MG (50000 UT) CAPS capsule Take 1 capsule (50,000 Units total) by mouth every 7 (seven) days. 4 capsule 0  . ranitidine (ZANTAC) 300 MG tablet Take 1 tablet (300 mg total) by mouth at bedtime. 90 tablet 1  . Cholecalciferol (VITAMIN D) 2000 units CAPS Take 1 capsule (2,000 Units total) by mouth daily. (Patient not taking: Reported on 11/11/2018) 30 capsule 5  . fluticasone (FLONASE) 50 MCG/ACT nasal spray Place 2 sprays into both nostrils daily. (Patient not taking: Reported on 11/11/2018) 16 g 6  . levocetirizine (XYZAL) 5 MG tablet Take 1 tablet (5 mg total) by mouth every evening. (Patient not taking: Reported on 11/11/2018) 30 tablet 5   No facility-administered medications prior to visit.     Allergies  Allergen Reactions  . Other Other (See Comments)    Seasonal allergies(pollen & grass)    Review of Systems  Constitutional: Negative for fever and malaise/fatigue.  HENT: Negative for congestion.   Eyes: Negative for blurred vision.  Respiratory: Negative for shortness of breath.   Cardiovascular: Negative for chest pain, palpitations and leg swelling.  Gastrointestinal: Positive for heartburn. Negative for abdominal pain, blood in stool and nausea.   Genitourinary: Negative for dysuria and frequency.  Musculoskeletal: Negative for falls.  Skin: Negative for rash.  Neurological: Negative for dizziness, loss of consciousness and headaches.  Endo/Heme/Allergies: Negative for environmental allergies.  Psychiatric/Behavioral: Negative for depression. The patient is not nervous/anxious.        Objective:    Physical Exam Vitals signs and nursing note reviewed.  Constitutional:      General: She is not in acute distress.    Appearance: She is well-developed.  HENT:     Head: Normocephalic and atraumatic.  Nose: Nose normal.  Eyes:     General:        Right eye: No discharge.        Left eye: No discharge.  Neck:     Musculoskeletal: Normal range of motion and neck supple.  Cardiovascular:     Rate and Rhythm: Normal rate and regular rhythm.     Heart sounds: No murmur.  Pulmonary:     Effort: Pulmonary effort is normal.     Breath sounds: Normal breath sounds.  Abdominal:     General: Bowel sounds are normal.     Palpations: Abdomen is soft.     Tenderness: There is no abdominal tenderness.  Skin:    General: Skin is warm and dry.  Neurological:     Mental Status: She is alert and oriented to person, place, and time.     BP 130/86   Pulse 70   Temp (!) 96.8 F (36 C) (Temporal)   Resp 18   Wt 219 lb 6.4 oz (99.5 kg)   SpO2 97%   BMI 34.36 kg/m  Wt Readings from Last 3 Encounters:  05/30/19 219 lb 6.4 oz (99.5 kg)  11/11/18 206 lb (93.4 kg)  11/11/18 206 lb 3.2 oz (93.5 kg)    Diabetic Foot Exam - Simple   No data filed     Lab Results  Component Value Date   WBC 5.4 01/28/2018   HGB 14.5 01/28/2018   HCT 43.0 01/28/2018   PLT 217.0 01/28/2018   GLUCOSE 90 05/15/2019   CHOL 227 (H) 05/15/2019   TRIG 246.0 (H) 05/15/2019   HDL 52.50 05/15/2019   LDLDIRECT 138.0 05/15/2019   LDLCALC 134 (H) 09/28/2018   ALT 24 05/15/2019   AST 23 05/15/2019   NA 133 (L) 05/15/2019   K 4.6 05/15/2019   CL 97  05/15/2019   CREATININE 0.66 05/15/2019   BUN 14 05/15/2019   CO2 26 05/15/2019   TSH 7.10 (H) 05/15/2019   INR 1.05 04/28/2017   HGBA1C 6.3 05/15/2019    Lab Results  Component Value Date   TSH 7.10 (H) 05/15/2019   Lab Results  Component Value Date   WBC 5.4 01/28/2018   HGB 14.5 01/28/2018   HCT 43.0 01/28/2018   MCV 95.6 01/28/2018   PLT 217.0 01/28/2018   Lab Results  Component Value Date   NA 133 (L) 05/15/2019   K 4.6 05/15/2019   CO2 26 05/15/2019   GLUCOSE 90 05/15/2019   BUN 14 05/15/2019   CREATININE 0.66 05/15/2019   BILITOT 0.4 05/15/2019   ALKPHOS 98 05/15/2019   AST 23 05/15/2019   ALT 24 05/15/2019   PROT 6.9 05/15/2019   ALBUMIN 4.6 05/15/2019   CALCIUM 9.9 05/15/2019   ANIONGAP 8 05/15/2017   GFR 85.07 05/15/2019   Lab Results  Component Value Date   CHOL 227 (H) 05/15/2019   Lab Results  Component Value Date   HDL 52.50 05/15/2019   Lab Results  Component Value Date   LDLCALC 134 (H) 09/28/2018   Lab Results  Component Value Date   TRIG 246.0 (H) 05/15/2019   Lab Results  Component Value Date   CHOLHDL 4 05/15/2019   Lab Results  Component Value Date   HGBA1C 6.3 05/15/2019       Assessment & Plan:   Problem List Items Addressed This Visit    Hypertension    Well controlled, no changes to meds. Encouraged heart healthy diet such as the  DASH diet and exercise as tolerated.       Hiatal hernia with gastroesophageal reflux    Avoid offending foods, start probiotics. Do not eat large meals in late evening and consider raising head of bed. Started Famotidine 40 mg qhs      Relevant Medications   famotidine (PEPCID) 40 MG tablet   Hypothyroidism    On Levothyroxine, continue to monitor      Vitamin D deficiency    Supplement and monitor      Hyperlipidemia, mild    Encouraged heart healthy diet, increase exercise, avoid trans fats, consider a krill oil cap daily      Hyperglycemia    hgba1c acceptable, minimize  simple carbs. Increase exercise as tolerated. Given flu shot today      Sun-damaged skin    Referred to dermatology for further consideration      Relevant Orders   Ambulatory referral to Dermatology    Other Visit Diagnoses    Needs flu shot    -  Primary   Relevant Orders   Flu Vaccine QUAD High Dose(Fluad) (Completed)      I have discontinued [name] Garro's Vitamin D, fluticasone, levocetirizine, and ranitidine. I am also having her start on famotidine. Additionally, I am having her maintain her multivitamin with minerals, brimonidine, polyethylene glycol, Vitamin D (Ergocalciferol), losartan, omeprazole, and levothyroxine.  Meds ordered this encounter  Medications  . famotidine (PEPCID) 40 MG tablet    Sig: Take 1 tablet (40 mg total) by mouth at bedtime.    Dispense:  90 tablet    Refill:  1     Penni Homans, MD

## 2019-06-02 NOTE — Assessment & Plan Note (Signed)
Supplement and monitor 

## 2019-06-02 NOTE — Assessment & Plan Note (Signed)
Well controlled, no changes to meds. Encouraged heart healthy diet such as the DASH diet and exercise as tolerated.  °

## 2019-06-05 ENCOUNTER — Telehealth (INDEPENDENT_AMBULATORY_CARE_PROVIDER_SITE_OTHER): Payer: Medicare Other | Admitting: Physician Assistant

## 2019-06-05 ENCOUNTER — Encounter (INDEPENDENT_AMBULATORY_CARE_PROVIDER_SITE_OTHER): Payer: Self-pay | Admitting: Physician Assistant

## 2019-06-05 ENCOUNTER — Other Ambulatory Visit: Payer: Self-pay

## 2019-06-05 DIAGNOSIS — E669 Obesity, unspecified: Secondary | ICD-10-CM

## 2019-06-05 DIAGNOSIS — E559 Vitamin D deficiency, unspecified: Secondary | ICD-10-CM | POA: Diagnosis not present

## 2019-06-05 DIAGNOSIS — Z6832 Body mass index (BMI) 32.0-32.9, adult: Secondary | ICD-10-CM | POA: Diagnosis not present

## 2019-06-07 NOTE — Progress Notes (Signed)
Office: [phone]  /  Fax: [phone] TeleHealth Visit:  Ryleah Turkovich has verbally consented to this TeleHealth visit today. The patient is located at home, the provider is located at the [facility] office. The participants in this visit include the listed provider and patient and any and all parties involved. The visit was conducted today via telephone. Adelisa was unable to use realtime audiovisual technology today and the telehealth visit was conducted via telephone.  HPI:   Chief Complaint: OBESITY Asijah is here to discuss her progress with her obesity treatment plan. She is on the Category 2 plan or the Pescatarian eating plan and she is following her eating plan approximately 0 % of the time. She states she is walking in the pool 60 minutes 3 times per week. Lavren reports that since Darby began, she has been at home doing a lot of boredom eating. She is having terrible cravings. We were unable to weigh the patient today for this TeleHealth visit. She feels as if she has gained weight since her last visit (weight not reported). She has lost 9 lbs since starting treatment with [place].  Vitamin D deficiency Marionette has a diagnosis of vitamin D deficiency. Her last vitamin D level was normal. She states she is now on OTC vit D and not prescribed vitamin D, and she is unsure of what dose she is taking. Shamyra denies nausea, vomiting or muscle weakness.  ASSESSMENT AND PLAN:  Vitamin D deficiency  Class 1 obesity with serious comorbidity and body mass index (BMI) of 32.0 to 32.9 in adult, unspecified obesity type  PLAN:  Vitamin D Deficiency Nierra was informed that low vitamin D levels contributes to fatigue and are associated with obesity, breast, and colon cancer. Saloma is to advise what dose of OTC vitamin D her PCP recommended. She will follow up for routine testing of vitamin D, at least 2-3 times per year. She was informed of the risk of over-replacement  of vitamin D and agrees to not increase her dose unless she discusses this with [place] first.  Obesity Javia is currently in the action stage of change. As such, her goal is to continue with weight loss efforts She has agreed to follow the Category 2 plan or the Pescatarian eating plan Leomia has been instructed to work up to a goal of 150 minutes of combined cardio and strengthening exercise per week for weight loss and overall health benefits. We discussed the following Behavioral Modification Strategies today: keeping healthy foods in the home and work on meal planning and easy cooking plans  Jakia has agreed to follow up with our clinic in 1 week. She was informed of the importance of frequent follow up visits to maximize her success with intensive lifestyle modifications for her multiple health conditions.  ALLERGIES: Allergies  Allergen Reactions   Other Other (See Comments)    Seasonal allergies(pollen & grass)    MEDICATIONS: Current Outpatient Medications on File Prior to Visit  Medication Sig Dispense Refill   brimonidine (ALPHAGAN) 0.2 % ophthalmic solution Place 1 drop into both eyes 2 (two) times daily. 5 mL 12   famotidine (PEPCID) 40 MG tablet Take 1 tablet (40 mg total) by mouth at bedtime. 90 tablet 1   levothyroxine (SYNTHROID) 112 MCG tablet Take 1 tablet (112 mcg total) by mouth daily before breakfast. 30 tablet 5   losartan (COZAAR) 50 MG tablet TAKE 1 TABLET BY MOUTH DAILY 90 tablet 1   Multiple Vitamins-Minerals (MULTIVITAMIN WITH  MINERALS) tablet Take 1 tablet by mouth daily.     omeprazole (PRILOSEC) 20 MG capsule TAKE ONE (1) CAPSULE EACH DAY BY MOUTH 90 capsule 1   polyethylene glycol (MIRALAX / GLYCOLAX) packet Take 17 g by mouth daily. 14 each 0   Vitamin D, Ergocalciferol, (DRISDOL) 1.25 MG (50000 UT) CAPS capsule Take 1 capsule (50,000 Units total) by mouth every 7 (seven) days. 4 capsule 0   No current facility-administered medications on file  prior to visit.     PAST MEDICAL HISTORY: Past Medical History:  Diagnosis Date   Arthritis    Atrophic rhinitis 04/21/2016   Constipation    Decreased hearing    Diverticulitis    Dry eyes    Fatigue    Floaters in visual field    Gall bladder disease    GERD (gastroesophageal reflux disease)    Glaucoma    Hiatal hernia with gastroesophageal reflux    History of blood transfusion    for Knee replacement   Hyperlipidemia, mild 04/02/2016   Hypertension    Hypothyroidism    Joint pain    Migraine aura without headache    Muscle pain    Muscle stiffness    Nasal congestion    Obesity 04/02/2016   Osteoarthritis    Parotiditis 04/21/2016   Vitamin D deficiency 04/02/2016    PAST SURGICAL HISTORY: Past Surgical History:  Procedure Laterality Date   APPENDECTOMY     cataract surgery  2008   CHOLECYSTECTOMY  2010   JOINT REPLACEMENT Left    2012   MASTOIDECTOMY     POSTERIOR CERVICAL FUSION/FORAMINOTOMY N/A 04/27/2017   Procedure: CERVICAL FIVE-SIX  OPEN REDUCTION OF FRACTURE, CERVICAL THREE-SEVEN  DORSAL FIXATION AND FUSION;  Surgeon: Ditty, Kevan Ny, MD;  Location: [facility];  Service: Neurosurgery;  Laterality: N/A;   SHOULDER ARTHROSCOPY Bilateral prior to 2010   [place] Right 03/22/2015   Procedure: RIGHT TOTAL HIP ARTHROPLASTY ANTERIOR APPROACH;  Surgeon: Dorna Leitz, MD;  Location: [facility];  Service: Orthopedics;  Laterality: Right;   TOTAL KNEE ARTHROPLASTY Bilateral 2007   TUBAL LIGATION      SOCIAL HISTORY: Social History   Tobacco Use   Smoking status: Former Smoker   Smokeless tobacco: Never Used   Tobacco comment: Stopped 50 years ago.   Substance Use Topics   Alcohol use: Yes    Alcohol/week: 0.0 standard drinks    Comment: socially 3-4x/wk   Drug use: No    FAMILY HISTORY: Family History  Problem Relation Age of Onset   Dementia Mother    Hypertension Mother    Obesity  Mother    Heart disease Father    Hypertension Father    Stroke Father    Diabetes Father    Hyperlipidemia Father    Alcoholism Father    Obesity Father    Arthritis Daughter    Cancer Maternal Grandfather        colon cancer    ROS: Review of Systems  Constitutional: Negative for weight loss.  Gastrointestinal: Negative for nausea and vomiting.  Musculoskeletal:       Negative for muscle weakness  Endo/Heme/Allergies:       Positive for cravings    PHYSICAL EXAM: Pt in no acute distress  RECENT LABS AND TESTS: BMET    Component Value Date/Time   NA 133 (L) 05/15/2019 1022   NA 137 09/28/2018 1147   K 4.6 05/15/2019 1022   CL  97 05/15/2019 1022   CO2 26 05/15/2019 1022   GLUCOSE 90 05/15/2019 1022   BUN 14 05/15/2019 1022   BUN 9 09/28/2018 1147   CREATININE 0.66 05/15/2019 1022   CALCIUM 9.9 05/15/2019 1022   GFRNONAA 81 09/28/2018 1147   GFRAA 94 09/28/2018 1147   Lab Results  Component Value Date   HGBA1C 6.3 05/15/2019   HGBA1C 6.1 (H) 09/28/2018   HGBA1C 6.2 (H) 05/30/2018   HGBA1C 6.1 01/28/2018   HGBA1C 6.4 10/18/2017   Lab Results  Component Value Date   INSULIN 9.8 09/28/2018   INSULIN 15.5 05/30/2018   CBC    Component Value Date/Time   WBC 5.4 01/28/2018 1030   RBC 4.50 01/28/2018 1030   HGB 14.5 01/28/2018 1030   HCT 43.0 01/28/2018 1030   PLT 217.0 01/28/2018 1030   MCV 95.6 01/28/2018 1030   MCH 30.6 05/15/2017 0524   MCHC 33.8 01/28/2018 1030   RDW 13.1 01/28/2018 1030   LYMPHSABS 1.4 05/15/2017 0524   MONOABS 0.6 05/15/2017 0524   EOSABS 0.3 05/15/2017 0524   BASOSABS 0.0 05/15/2017 0524   Iron/TIBC/Ferritin/ %Sat No results found for: IRON, TIBC, FERRITIN, IRONPCTSAT Lipid Panel     Component Value Date/Time   CHOL 227 (H) 05/15/2019 1022   CHOL 226 (H) 09/28/2018 1147   TRIG 246.0 (H) 05/15/2019 1022   HDL 52.50 05/15/2019 1022   HDL 60 09/28/2018 1147   CHOLHDL 4 05/15/2019 1022   VLDL 49.2 (H) 05/15/2019  1022   LDLCALC 134 (H) 09/28/2018 1147   LDLDIRECT 138.0 05/15/2019 1022   Hepatic Function Panel     Component Value Date/Time   PROT 6.9 05/15/2019 1022   PROT 6.7 09/28/2018 1147   ALBUMIN 4.6 05/15/2019 1022   ALBUMIN 4.6 09/28/2018 1147   AST 23 05/15/2019 1022   ALT 24 05/15/2019 1022   ALKPHOS 98 05/15/2019 1022   BILITOT 0.4 05/15/2019 1022   BILITOT 0.3 09/28/2018 1147      Component Value Date/Time   TSH 7.10 (H) 05/15/2019 1022   TSH 7.710 (H) 10/13/2018 1404   TSH 13.390 (H) 09/28/2018 1147     Ref. Range 09/28/2018 11:47  Vitamin D, 25-Hydroxy Latest Ref Range: 30.0 - 100.0 ng/mL 41.6    I, Doreene Nest, am acting as Location manager for Abby Potash, PA-C I, Abby Potash, PA-C have reviewed above note and agree with its content

## 2019-06-12 ENCOUNTER — Ambulatory Visit (INDEPENDENT_AMBULATORY_CARE_PROVIDER_SITE_OTHER): Payer: Medicare Other | Admitting: Physician Assistant

## 2019-06-12 ENCOUNTER — Other Ambulatory Visit: Payer: Self-pay

## 2019-06-12 VITALS — BP 160/78 | HR 70 | Temp 97.7°F | Ht 67.0 in | Wt 216.0 lb

## 2019-06-12 DIAGNOSIS — Z6833 Body mass index (BMI) 33.0-33.9, adult: Secondary | ICD-10-CM | POA: Diagnosis not present

## 2019-06-12 DIAGNOSIS — E559 Vitamin D deficiency, unspecified: Secondary | ICD-10-CM | POA: Diagnosis not present

## 2019-06-12 DIAGNOSIS — R7303 Prediabetes: Secondary | ICD-10-CM

## 2019-06-12 DIAGNOSIS — E669 Obesity, unspecified: Secondary | ICD-10-CM | POA: Diagnosis not present

## 2019-06-12 NOTE — Progress Notes (Signed)
Office: [phone]  /  Fax: [phone]   HPI:   Chief Complaint: OBESITY Catherine Coleman is here to discuss her progress with her obesity treatment plan. She is on the  follow the Category 2 plan and is following her eating plan approximately 0 % of the time. She states she is exercising by walking in water for 60 minutes 3 times per week. Catherine Coleman reports that since COVID started she has been off track with her eating and snacking a lot. She is ready to get back on track.  Her weight is 216 lb (98 kg) today and has had a weight gain of 15 pounds over a period of 7 months since her last in office visit. She has lost 0 lbs since starting treatment with [place].  Pre-Diabetes Catherine Coleman has a diagnosis of prediabetes based on her elevated HgA1c and was informed this puts her at greater risk of developing diabetes. She is not taking metformin currently and continues to work on diet and exercise to decrease risk of diabetes. She denies nausea or hypoglycemia. She reports polyphagia.   Vitamin D deficiency Catherine Coleman has a diagnosis of vitamin D deficiency. She is currently taking vit D and denies nausea, vomiting or muscle weakness.  ASSESSMENT AND PLAN:  Prediabetes - Plan: Insulin, random  Vitamin D deficiency  Class 1 obesity with serious comorbidity and body mass index (BMI) of 33.0 to 33.9 in adult, unspecified obesity type  PLAN: Pre-Diabetes Catherine Coleman will continue to work on weight loss, exercise, and decreasing simple carbohydrates in her diet to help decrease the risk of diabetes. We dicussed metformin including benefits and risks. She was informed that eating too many simple carbohydrates or too many calories at one sitting increases the likelihood of GI side effects. Catherine Coleman agreed to follow up with [place] as directed to monitor her progress. We will repeat insulin level today.   Vitamin D Deficiency Catherine Coleman was informed that low vitamin D levels contributes to fatigue and are associated with  obesity, breast, and colon cancer. She agrees to continue to take prescription Vit D @50 ,000 IU every week and will follow up for routine testing of vitamin D, at least 2-3 times per year. She was informed of the risk of over-replacement of vitamin D and agrees to not increase her dose unless she discusses this with [place] first. Agrees to follow up with our clinic as directed.   Obesity Catherine Coleman is currently in the action stage of change. As such, her goal is to continue with weight loss efforts She has agreed to follow the Category 2 plan Catherine Coleman has been instructed to work up to a goal of 150 minutes of combined cardio and strengthening exercise per week for weight loss and overall health benefits. We discussed the following Behavioral Modification Strategies today: increasing lean protein intake and decreasing simple carbohydrates    Catherine Coleman has agreed to follow up with our clinic in 2 weeks. She was informed of the importance of frequent follow up visits to maximize her success with intensive lifestyle modifications for her multiple health conditions.  ALLERGIES: Allergies  Allergen Reactions  . Other Other (See Comments)    Seasonal allergies(pollen & grass)    MEDICATIONS: Current Outpatient Medications on File Prior to Visit  Medication Sig Dispense Refill  . brimonidine (ALPHAGAN) 0.2 % ophthalmic solution Place 1 drop into both eyes 2 (two) times daily. 5 mL 12  . famotidine (PEPCID) 40 MG tablet Take 1 tablet (40 mg total) by mouth at bedtime. 90 tablet  1  . levothyroxine (SYNTHROID) 112 MCG tablet Take 1 tablet (112 mcg total) by mouth daily before breakfast. 30 tablet 5  . losartan (COZAAR) 50 MG tablet TAKE 1 TABLET BY MOUTH DAILY 90 tablet 1  . Multiple Vitamins-Minerals (MULTIVITAMIN WITH MINERALS) tablet Take 1 tablet by mouth daily.    Marland Kitchen omeprazole (PRILOSEC) 20 MG capsule TAKE ONE (1) CAPSULE EACH DAY BY MOUTH 90 capsule 1  . polyethylene glycol (MIRALAX / GLYCOLAX) packet  Take 17 g by mouth daily. 14 each 0  . Vitamin D, Ergocalciferol, (DRISDOL) 1.25 MG (50000 UT) CAPS capsule Take 1 capsule (50,000 Units total) by mouth every 7 (seven) days. 4 capsule 0   No current facility-administered medications on file prior to visit.     PAST MEDICAL HISTORY: Past Medical History:  Diagnosis Date  . Arthritis   . Atrophic rhinitis 04/21/2016  . Constipation   . Decreased hearing   . Diverticulitis   . Dry eyes   . Fatigue   . Floaters in visual field   . Gall bladder disease   . GERD (gastroesophageal reflux disease)   . Glaucoma   . Hiatal hernia with gastroesophageal reflux   . History of blood transfusion    for Knee replacement  . Hyperlipidemia, mild 04/02/2016  . Hypertension   . Hypothyroidism   . Joint pain   . Migraine aura without headache   . Muscle pain   . Muscle stiffness   . Nasal congestion   . Obesity 04/02/2016  . Osteoarthritis   . Parotiditis 04/21/2016  . Vitamin D deficiency 04/02/2016    PAST SURGICAL HISTORY: Past Surgical History:  Procedure Laterality Date  . APPENDECTOMY    . cataract surgery  2008  . CHOLECYSTECTOMY  2010  . JOINT REPLACEMENT Left    2012  . MASTOIDECTOMY    . POSTERIOR CERVICAL FUSION/FORAMINOTOMY N/A 04/27/2017   Procedure: CERVICAL FIVE-SIX  OPEN REDUCTION OF FRACTURE, CERVICAL THREE-SEVEN  DORSAL FIXATION AND FUSION;  Surgeon: Ditty, Kevan Ny, MD;  Location: [facility];  Service: Neurosurgery;  Laterality: N/A;  . SHOULDER ARTHROSCOPY Bilateral prior to 2010  . TONSILLECTOMY    . TOTAL HIP ARTHROPLASTY Right 03/22/2015   Procedure: RIGHT TOTAL HIP ARTHROPLASTY ANTERIOR APPROACH;  Surgeon: Dorna Leitz, MD;  Location: [facility];  Service: Orthopedics;  Laterality: Right;  . TOTAL KNEE ARTHROPLASTY Bilateral 2007  . TUBAL LIGATION      SOCIAL HISTORY: Social History   Tobacco Use  . Smoking status: Former Research scientist (life sciences)  . Smokeless tobacco: Never Used  . Tobacco comment: Stopped 50 years ago.   Substance  Use Topics  . Alcohol use: Yes    Alcohol/week: 0.0 standard drinks    Comment: socially 3-4x/wk  . Drug use: No    FAMILY HISTORY: Family History  Problem Relation Age of Onset  . Dementia Mother   . Hypertension Mother   . Obesity Mother   . Heart disease Father   . Hypertension Father   . Stroke Father   . Diabetes Father   . Hyperlipidemia Father   . Alcoholism Father   . Obesity Father   . Arthritis Daughter   . Cancer Maternal Grandfather        colon cancer    ROS: Review of Systems  Constitutional: Negative for weight loss.  Gastrointestinal: Negative for nausea and vomiting.  Musculoskeletal:       Negative for muscle weakness  Endo/Heme/Allergies:       Negative for hypoglycemia  Positive for polyphagia     PHYSICAL EXAM: Blood pressure (!) 160/78, pulse 70, temperature 97.7 F (36.5 C), temperature source Oral, height 5\' 7"  (1.702 m), weight 216 lb (98 kg), SpO2 95 %. Body mass index is 33.83 kg/m. Physical Exam Vitals signs reviewed.  Constitutional:      Appearance: Normal appearance. She is obese.  HENT:     Head: Normocephalic.     Nose: Nose normal.  Neck:     Musculoskeletal: Normal range of motion.  Cardiovascular:     Rate and Rhythm: Normal rate.  Pulmonary:     Effort: Pulmonary effort is normal.  Musculoskeletal: Normal range of motion.  Skin:    General: Skin is warm and dry.  Neurological:     Mental Status: She is alert and oriented to person, place, and time.  Psychiatric:        Mood and Affect: Mood normal.        Behavior: Behavior normal.     RECENT LABS AND TESTS: BMET    Component Value Date/Time   NA 133 (L) 05/15/2019 1022   NA 137 09/28/2018 1147   K 4.6 05/15/2019 1022   CL 97 05/15/2019 1022   CO2 26 05/15/2019 1022   GLUCOSE 90 05/15/2019 1022   BUN 14 05/15/2019 1022   BUN 9 09/28/2018 1147   CREATININE 0.66 05/15/2019 1022   CALCIUM 9.9 05/15/2019 1022   GFRNONAA 81 09/28/2018 1147   GFRAA 94  09/28/2018 1147   Lab Results  Component Value Date   HGBA1C 6.3 05/15/2019   HGBA1C 6.1 (H) 09/28/2018   HGBA1C 6.2 (H) 05/30/2018   HGBA1C 6.1 01/28/2018   HGBA1C 6.4 10/18/2017   Lab Results  Component Value Date   INSULIN 9.8 09/28/2018   INSULIN 15.5 05/30/2018   CBC    Component Value Date/Time   WBC 5.4 01/28/2018 1030   RBC 4.50 01/28/2018 1030   HGB 14.5 01/28/2018 1030   HCT 43.0 01/28/2018 1030   PLT 217.0 01/28/2018 1030   MCV 95.6 01/28/2018 1030   MCH 30.6 05/15/2017 0524   MCHC 33.8 01/28/2018 1030   RDW 13.1 01/28/2018 1030   LYMPHSABS 1.4 05/15/2017 0524   MONOABS 0.6 05/15/2017 0524   EOSABS 0.3 05/15/2017 0524   BASOSABS 0.0 05/15/2017 0524   Iron/TIBC/Ferritin/ %Sat No results found for: IRON, TIBC, FERRITIN, IRONPCTSAT Lipid Panel     Component Value Date/Time   CHOL 227 (H) 05/15/2019 1022   CHOL 226 (H) 09/28/2018 1147   TRIG 246.0 (H) 05/15/2019 1022   HDL 52.50 05/15/2019 1022   HDL 60 09/28/2018 1147   CHOLHDL 4 05/15/2019 1022   VLDL 49.2 (H) 05/15/2019 1022   LDLCALC 134 (H) 09/28/2018 1147   LDLDIRECT 138.0 05/15/2019 1022   Hepatic Function Panel     Component Value Date/Time   PROT 6.9 05/15/2019 1022   PROT 6.7 09/28/2018 1147   ALBUMIN 4.6 05/15/2019 1022   ALBUMIN 4.6 09/28/2018 1147   AST 23 05/15/2019 1022   ALT 24 05/15/2019 1022   ALKPHOS 98 05/15/2019 1022   BILITOT 0.4 05/15/2019 1022   BILITOT 0.3 09/28/2018 1147      Component Value Date/Time   TSH 7.10 (H) 05/15/2019 1022   TSH 7.710 (H) 10/13/2018 1404   TSH 13.390 (H) 09/28/2018 1147     Ref. Range 09/28/2018 11:47  Vitamin D, 25-Hydroxy Latest Ref Range: 30.0 - 100.0 ng/mL 41.6     OBESITY BEHAVIORAL INTERVENTION VISIT  Today's visit was # 11   Starting weight: 210 lbs Starting date: 05/30/18 Today's weight : Weight: 216 lb (98 kg)  Today's date: 06/12/2019 Total lbs lost to date: 0 At least 15 minutes were spent on discussing the following  behavioral intervention visit.   ASK: We discussed the diagnosis of obesity with Catherine Coleman today and Windi agreed to give [place] permission to discuss obesity behavioral modification therapy today.  ASSESS: Catherine Coleman has the diagnosis of obesity and her BMI today is 33.82 Catherine Coleman is in the action stage of change   ADVISE: Catherine Coleman was educated on the multiple health risks of obesity as well as the benefit of weight loss to improve her health. She was advised of the need for long term treatment and the importance of lifestyle modifications to improve her current health and to decrease her risk of future health problems.  AGREE: Multiple dietary modification options and treatment options were discussed and  Catherine Coleman agreed to follow the recommendations documented in the above note.  ARRANGE: Catherine Coleman was educated on the importance of frequent visits to treat obesity as outlined per CMS and USPSTF guidelines and agreed to schedule her next follow up appointment today.  Leary Roca, am acting as transcriptionist for Abby Potash, PA-C  I, Abby Potash, PA-C have reviewed above note and agree with its content

## 2019-06-13 LAB — INSULIN, RANDOM: INSULIN: 11.5 u[IU]/mL (ref 2.6–24.9)

## 2019-06-19 ENCOUNTER — Other Ambulatory Visit (HOSPITAL_BASED_OUTPATIENT_CLINIC_OR_DEPARTMENT_OTHER): Payer: Self-pay | Admitting: Family Medicine

## 2019-06-19 DIAGNOSIS — Z1239 Encounter for other screening for malignant neoplasm of breast: Secondary | ICD-10-CM

## 2019-06-26 ENCOUNTER — Other Ambulatory Visit: Payer: Self-pay

## 2019-06-26 ENCOUNTER — Ambulatory Visit (INDEPENDENT_AMBULATORY_CARE_PROVIDER_SITE_OTHER): Payer: Medicare Other | Admitting: Physician Assistant

## 2019-06-26 ENCOUNTER — Encounter (INDEPENDENT_AMBULATORY_CARE_PROVIDER_SITE_OTHER): Payer: Self-pay | Admitting: Physician Assistant

## 2019-06-26 VITALS — BP 115/75 | HR 68 | Temp 97.9°F | Ht 67.0 in | Wt 213.0 lb

## 2019-06-26 DIAGNOSIS — Z6833 Body mass index (BMI) 33.0-33.9, adult: Secondary | ICD-10-CM

## 2019-06-26 DIAGNOSIS — E038 Other specified hypothyroidism: Secondary | ICD-10-CM

## 2019-06-26 DIAGNOSIS — E669 Obesity, unspecified: Secondary | ICD-10-CM

## 2019-06-27 NOTE — Progress Notes (Signed)
Office: [phone]  /  Fax: [phone]   HPI:   Chief Complaint: OBESITY Catherine Coleman is here to discuss her progress with her obesity treatment plan. She is on the Category 2 plan and is following her eating plan approximately 60 % of the time. She states she is water walking 60 minutes 3 times per week. Catherine Coleman reports that she has done a better job getting back on the plan. She is still craving carbs here and there. Her weight is 213 lb (96.6 kg) today and has had a weight loss of 3 pounds over a period of 2 weeks since her last visit. She has gained 3 lbs since starting treatment with [place].  Hypothyroidism Catherine Coleman has a diagnosis of hypothyroidism. She is on levothyroxine. She denies hot or cold intolerance.  ASSESSMENT AND PLAN:  Other specified hypothyroidism  Class 1 obesity with serious comorbidity and body mass index (BMI) of 33.0 to 33.9 in adult, unspecified obesity type  PLAN:  Hypothyroidism Catherine Coleman was informed of the importance of good thyroid control to help with weight loss efforts. She was also informed that supertherapeutic thyroid levels are dangerous and will not improve weight loss results. Catherine Coleman will continue levothyroxine and follow up as directed.  Obesity Catherine Coleman is currently in the action stage of change. As such, her goal is to continue with weight loss efforts She has agreed to follow the Category 2 plan Catherine Coleman has been instructed to work up to a goal of 150 minutes of combined cardio and strengthening exercise per week for weight loss and overall health benefits. We discussed the following Behavioral Modification Strategies today: keeping healthy foods in the home and work on meal planning and easy cooking plans  Catherine Coleman has agreed to follow up with our clinic in 2 weeks. She was informed of the importance of frequent follow up visits to maximize her success with intensive lifestyle modifications for her multiple health  conditions.  ALLERGIES: Allergies  Allergen Reactions   Other Other (See Comments)    Seasonal allergies(pollen & grass)    MEDICATIONS: Current Outpatient Medications on File Prior to Visit  Medication Sig Dispense Refill   brimonidine (ALPHAGAN) 0.2 % ophthalmic solution Place 1 drop into both eyes 2 (two) times daily. 5 mL 12   famotidine (PEPCID) 40 MG tablet Take 1 tablet (40 mg total) by mouth at bedtime. 90 tablet 1   levothyroxine (SYNTHROID) 112 MCG tablet Take 1 tablet (112 mcg total) by mouth daily before breakfast. 30 tablet 5   losartan (COZAAR) 50 MG tablet TAKE 1 TABLET BY MOUTH DAILY 90 tablet 1   Multiple Vitamins-Minerals (MULTIVITAMIN WITH MINERALS) tablet Take 1 tablet by mouth daily.     omeprazole (PRILOSEC) 20 MG capsule TAKE ONE (1) CAPSULE EACH DAY BY MOUTH 90 capsule 1   polyethylene glycol (MIRALAX / GLYCOLAX) packet Take 17 g by mouth daily. 14 each 0   No current facility-administered medications on file prior to visit.     PAST MEDICAL HISTORY: Past Medical History:  Diagnosis Date   Arthritis    Atrophic rhinitis 04/21/2016   Constipation    Decreased hearing    Diverticulitis    Dry eyes    Fatigue    Floaters in visual field    Gall bladder disease    GERD (gastroesophageal reflux disease)    Glaucoma    Hiatal hernia with gastroesophageal reflux    History of blood transfusion    for Knee replacement   Hyperlipidemia, mild 04/02/2016  Hypertension    Hypothyroidism    Joint pain    Migraine aura without headache    Muscle pain    Muscle stiffness    Nasal congestion    Obesity 04/02/2016   Osteoarthritis    Parotiditis 04/21/2016   Vitamin D deficiency 04/02/2016    PAST SURGICAL HISTORY: Past Surgical History:  Procedure Laterality Date   APPENDECTOMY     cataract surgery  2008   CHOLECYSTECTOMY  2010   JOINT REPLACEMENT Left    2012   MASTOIDECTOMY     POSTERIOR CERVICAL  FUSION/FORAMINOTOMY N/A 04/27/2017   Procedure: CERVICAL FIVE-SIX  OPEN REDUCTION OF FRACTURE, CERVICAL THREE-SEVEN  DORSAL FIXATION AND FUSION;  Surgeon: Ditty, Kevan Ny, MD;  Location: [facility];  Service: Neurosurgery;  Laterality: N/A;   SHOULDER ARTHROSCOPY Bilateral prior to 2010   [place] Right 03/22/2015   Procedure: RIGHT TOTAL HIP ARTHROPLASTY ANTERIOR APPROACH;  Surgeon: Dorna Leitz, MD;  Location: [facility];  Service: Orthopedics;  Laterality: Right;   TOTAL KNEE ARTHROPLASTY Bilateral 2007   TUBAL LIGATION      SOCIAL HISTORY: Social History   Tobacco Use   Smoking status: Former Smoker   Smokeless tobacco: Never Used   Tobacco comment: Stopped 50 years ago.   Substance Use Topics   Alcohol use: Yes    Alcohol/week: 0.0 standard drinks    Comment: socially 3-4x/wk   Drug use: No    FAMILY HISTORY: Family History  Problem Relation Age of Onset   Dementia Mother    Hypertension Mother    Obesity Mother    Heart disease Father    Hypertension Father    Stroke Father    Diabetes Father    Hyperlipidemia Father    Alcoholism Father    Obesity Father    Arthritis Daughter    Cancer Maternal Grandfather        colon cancer    ROS: Review of Systems  Constitutional: Positive for weight loss.  Endo/Heme/Allergies:       Negative for heat or cold intolerance Positive for carb cravings    PHYSICAL EXAM: Blood pressure 115/75, pulse 68, temperature 97.9 F (36.6 C), temperature source Oral, height 5\' 7"  (1.702 m), weight 213 lb (96.6 kg), SpO2 94 %. Body mass index is 33.36 kg/m. Physical Exam Vitals signs reviewed.  Constitutional:      Appearance: Normal appearance. She is well-developed. She is obese.  Cardiovascular:     Rate and Rhythm: Normal rate.  Pulmonary:     Effort: Pulmonary effort is normal.  Musculoskeletal: Normal range of motion.  Skin:    General: Skin is warm and dry.  Neurological:      Mental Status: She is alert and oriented to person, place, and time.  Psychiatric:        Mood and Affect: Mood normal.        Behavior: Behavior normal.     RECENT LABS AND TESTS: BMET    Component Value Date/Time   NA 133 (L) 05/15/2019 1022   NA 137 09/28/2018 1147   K 4.6 05/15/2019 1022   CL 97 05/15/2019 1022   CO2 26 05/15/2019 1022   GLUCOSE 90 05/15/2019 1022   BUN 14 05/15/2019 1022   BUN 9 09/28/2018 1147   CREATININE 0.66 05/15/2019 1022   CALCIUM 9.9 05/15/2019 1022   GFRNONAA 81 09/28/2018 1147   GFRAA 94 09/28/2018 1147   Lab Results  Component Value  Date   HGBA1C 6.3 05/15/2019   HGBA1C 6.1 (H) 09/28/2018   HGBA1C 6.2 (H) 05/30/2018   HGBA1C 6.1 01/28/2018   HGBA1C 6.4 10/18/2017   Lab Results  Component Value Date   INSULIN 11.5 06/12/2019   INSULIN 9.8 09/28/2018   INSULIN 15.5 05/30/2018   CBC    Component Value Date/Time   WBC 5.4 01/28/2018 1030   RBC 4.50 01/28/2018 1030   HGB 14.5 01/28/2018 1030   HCT 43.0 01/28/2018 1030   PLT 217.0 01/28/2018 1030   MCV 95.6 01/28/2018 1030   MCH 30.6 05/15/2017 0524   MCHC 33.8 01/28/2018 1030   RDW 13.1 01/28/2018 1030   LYMPHSABS 1.4 05/15/2017 0524   MONOABS 0.6 05/15/2017 0524   EOSABS 0.3 05/15/2017 0524   BASOSABS 0.0 05/15/2017 0524   Iron/TIBC/Ferritin/ %Sat No results found for: IRON, TIBC, FERRITIN, IRONPCTSAT Lipid Panel     Component Value Date/Time   CHOL 227 (H) 05/15/2019 1022   CHOL 226 (H) 09/28/2018 1147   TRIG 246.0 (H) 05/15/2019 1022   HDL 52.50 05/15/2019 1022   HDL 60 09/28/2018 1147   CHOLHDL 4 05/15/2019 1022   VLDL 49.2 (H) 05/15/2019 1022   LDLCALC 134 (H) 09/28/2018 1147   LDLDIRECT 138.0 05/15/2019 1022   Hepatic Function Panel     Component Value Date/Time   PROT 6.9 05/15/2019 1022   PROT 6.7 09/28/2018 1147   ALBUMIN 4.6 05/15/2019 1022   ALBUMIN 4.6 09/28/2018 1147   AST 23 05/15/2019 1022   ALT 24 05/15/2019 1022   ALKPHOS 98 05/15/2019  1022   BILITOT 0.4 05/15/2019 1022   BILITOT 0.3 09/28/2018 1147      Component Value Date/Time   TSH 7.10 (H) 05/15/2019 1022   TSH 7.710 (H) 10/13/2018 1404   TSH 13.390 (H) 09/28/2018 1147     Ref. Range 05/15/2019 10:22  Vitamin D 1, 25 (OH) Total Latest Ref Range: 18 - 72 pg/mL 54    OBESITY BEHAVIORAL INTERVENTION VISIT  Today's visit was # 12   Starting weight: 210 lbs Starting date: 05/30/2018 Today's weight : 213 lbs  Today's date: 06/26/2019 Total lbs lost to date: 0    06/26/2019  Height 5\' 7"  (1.702 m)  Weight 213 lb (96.6 kg)  BMI (Calculated) 33.35  BLOOD PRESSURE - SYSTOLIC [id number]  BLOOD PRESSURE - DIASTOLIC 75   Body Fat % 50 %  Total Body Water (lbs) 79.8 lbs     ASK: We discussed the diagnosis of obesity with Catherine Coleman today and Catherine Coleman agreed to give [place] permission to discuss obesity behavioral modification therapy today.  ASSESS: Lessly has the diagnosis of obesity and her BMI today is 33.35 Catherine Coleman is in the action stage of change   ADVISE: Catherine Coleman was educated on the multiple health risks of obesity as well as the benefit of weight loss to improve her health. She was advised of the need for long term treatment and the importance of lifestyle modifications to improve her current health and to decrease her risk of future health problems.  AGREE: Multiple dietary modification options and treatment options were discussed and  Catherine Coleman agreed to follow the recommendations documented in the above note.  ARRANGE: Catherine Coleman was educated on the importance of frequent visits to treat obesity as outlined per CMS and USPSTF guidelines and agreed to schedule her next follow up appointment today.  Corey Skains, am acting as transcriptionist for Abby Potash, PA-C I, Abby Potash, PA-C have reviewed above  note and agree with its content

## 2019-06-28 ENCOUNTER — Other Ambulatory Visit: Payer: Self-pay | Admitting: Family Medicine

## 2019-07-10 ENCOUNTER — Ambulatory Visit (INDEPENDENT_AMBULATORY_CARE_PROVIDER_SITE_OTHER): Payer: Medicare Other | Admitting: Physician Assistant

## 2019-07-10 ENCOUNTER — Encounter (INDEPENDENT_AMBULATORY_CARE_PROVIDER_SITE_OTHER): Payer: Self-pay | Admitting: Physician Assistant

## 2019-07-10 ENCOUNTER — Other Ambulatory Visit: Payer: Self-pay

## 2019-07-10 VITALS — BP 116/76 | HR 65 | Temp 97.5°F | Ht 67.0 in | Wt 212.0 lb

## 2019-07-10 DIAGNOSIS — E669 Obesity, unspecified: Secondary | ICD-10-CM

## 2019-07-10 DIAGNOSIS — E7849 Other hyperlipidemia: Secondary | ICD-10-CM

## 2019-07-10 DIAGNOSIS — Z6833 Body mass index (BMI) 33.0-33.9, adult: Secondary | ICD-10-CM | POA: Diagnosis not present

## 2019-07-11 NOTE — Progress Notes (Signed)
Office: [phone]  /  Fax: [phone]   HPI:   Chief Complaint: OBESITY Catherine Coleman is here to discuss her progress with her obesity treatment plan. She is on the Category 2 plan and is following her eating plan approximately 50 % of the time. She states she is walking around in the pool 60 minutes, 3 times per week. Catherine Coleman reports that she was "out of control" last week with her eating. She has been snacking on pretzels. Her weight is 212 lb (96.2 kg) today and has had a weight loss of 1 pound over a period of 2 weeks since her last visit. She has gained 2 lbs since starting treatment with [place].  Hyperlipidemia Catherine Coleman has hyperlipidemia and she is not on medications. She has been trying to improve her cholesterol levels with intensive lifestyle modification including a low saturated fat diet, exercise and weight loss. She denies any chest pain.  ASSESSMENT AND PLAN:  Other hyperlipidemia  Class 1 obesity with serious comorbidity and body mass index (BMI) of 33.0 to 33.9 in adult, unspecified obesity type  PLAN:  Hyperlipidemia Catherine Coleman was informed of the American Heart Association Guidelines emphasizing intensive lifestyle modifications as the first line treatment for hyperlipidemia. We discussed many lifestyle modifications today in depth, and Catherine Coleman will continue to work on decreasing saturated fats such as fatty red meat, butter and many fried foods. She will also increase vegetables and lean protein in her diet and continue to work on exercise and weight loss efforts.  Obesity Catherine Coleman is currently in the action stage of change. As such, her goal is to continue with weight loss efforts She has agreed to change to keeping a food journal with 1200 calories and 80 grams of protein daily Catherine Coleman has been instructed to work up to a goal of 150 minutes of combined cardio and strengthening exercise per week for weight loss and overall health benefits. We discussed the following  Behavioral Modification Strategies today: keeping healthy foods in the home and work on meal planning and easy cooking plans  Catherine Coleman has agreed to follow up with our clinic in 3 weeks. She was informed of the importance of frequent follow up visits to maximize her success with intensive lifestyle modifications for her multiple health conditions.  ALLERGIES: Allergies  Allergen Reactions   Other Other (See Comments)    Seasonal allergies(pollen & grass)    MEDICATIONS: Current Outpatient Medications on File Prior to Visit  Medication Sig Dispense Refill   brimonidine (ALPHAGAN) 0.2 % ophthalmic solution Place 1 drop into both eyes 2 (two) times daily. 5 mL 12   famotidine (PEPCID) 40 MG tablet Take 1 tablet (40 mg total) by mouth at bedtime. 90 tablet 1   levothyroxine (SYNTHROID) 112 MCG tablet Take 1 tablet (112 mcg total) by mouth daily before breakfast. 30 tablet 5   losartan (COZAAR) 50 MG tablet TAKE ONE (1) TABLET BY MOUTH EACH DAY 90 tablet 1   Multiple Vitamins-Minerals (MULTIVITAMIN WITH MINERALS) tablet Take 1 tablet by mouth daily.     omeprazole (PRILOSEC) 20 MG capsule TAKE ONE (1) CAPSULE EACH DAY BY MOUTH 90 capsule 1   polyethylene glycol (MIRALAX / GLYCOLAX) packet Take 17 g by mouth daily. 14 each 0   No current facility-administered medications on file prior to visit.     PAST MEDICAL HISTORY: Past Medical History:  Diagnosis Date   Arthritis    Atrophic rhinitis 04/21/2016   Constipation    Decreased hearing    Diverticulitis  Dry eyes    Fatigue    Floaters in visual field    Gall bladder disease    GERD (gastroesophageal reflux disease)    Glaucoma    Hiatal hernia with gastroesophageal reflux    History of blood transfusion    for Knee replacement   Hyperlipidemia, mild 04/02/2016   Hypertension    Hypothyroidism    Joint pain    Migraine aura without headache    Muscle pain    Muscle stiffness    Nasal congestion      Obesity 04/02/2016   Osteoarthritis    Parotiditis 04/21/2016   Vitamin D deficiency 04/02/2016    PAST SURGICAL HISTORY: Past Surgical History:  Procedure Laterality Date   APPENDECTOMY     cataract surgery  2008   CHOLECYSTECTOMY  2010   JOINT REPLACEMENT Left    2012   MASTOIDECTOMY     POSTERIOR CERVICAL FUSION/FORAMINOTOMY N/A 04/27/2017   Procedure: CERVICAL FIVE-SIX  OPEN REDUCTION OF FRACTURE, CERVICAL THREE-SEVEN  DORSAL FIXATION AND FUSION;  Surgeon: Ditty, Kevan Ny, MD;  Location: [facility];  Service: Neurosurgery;  Laterality: N/A;   SHOULDER ARTHROSCOPY Bilateral prior to 2010   [place] Right 03/22/2015   Procedure: RIGHT TOTAL HIP ARTHROPLASTY ANTERIOR APPROACH;  Surgeon: Dorna Leitz, MD;  Location: [facility];  Service: Orthopedics;  Laterality: Right;   TOTAL KNEE ARTHROPLASTY Bilateral 2007   TUBAL LIGATION      SOCIAL HISTORY: Social History   Tobacco Use   Smoking status: Former Smoker   Smokeless tobacco: Never Used   Tobacco comment: Stopped 50 years ago.   Substance Use Topics   Alcohol use: Yes    Alcohol/week: 0.0 standard drinks    Comment: socially 3-4x/wk   Drug use: No    FAMILY HISTORY: Family History  Problem Relation Age of Onset   Dementia Mother    Hypertension Mother    Obesity Mother    Heart disease Father    Hypertension Father    Stroke Father    Diabetes Father    Hyperlipidemia Father    Alcoholism Father    Obesity Father    Arthritis Daughter    Cancer Maternal Grandfather        colon cancer    ROS: Review of Systems  Constitutional: Positive for weight loss.  Cardiovascular: Negative for chest pain.    PHYSICAL EXAM: Blood pressure 116/76, pulse 65, temperature (!) 97.5 F (36.4 C), temperature source Oral, height 5\' 7"  (1.702 m), weight 212 lb (96.2 kg), SpO2 95 %. Body mass index is 33.2 kg/m. Physical Exam Vitals signs reviewed.   Constitutional:      Appearance: Normal appearance. She is well-developed. She is obese.  Cardiovascular:     Rate and Rhythm: Normal rate.  Pulmonary:     Effort: Pulmonary effort is normal.  Musculoskeletal: Normal range of motion.  Skin:    General: Skin is warm and dry.  Neurological:     Mental Status: She is alert and oriented to person, place, and time.  Psychiatric:        Mood and Affect: Mood normal.        Behavior: Behavior normal.     RECENT LABS AND TESTS: BMET    Component Value Date/Time   NA 133 (L) 05/15/2019 1022   NA 137 09/28/2018 1147   K 4.6 05/15/2019 1022   CL 97 05/15/2019 1022   CO2 26 05/15/2019 1022  GLUCOSE 90 05/15/2019 1022   BUN 14 05/15/2019 1022   BUN 9 09/28/2018 1147   CREATININE 0.66 05/15/2019 1022   CALCIUM 9.9 05/15/2019 1022   GFRNONAA 81 09/28/2018 1147   GFRAA 94 09/28/2018 1147   Lab Results  Component Value Date   HGBA1C 6.3 05/15/2019   HGBA1C 6.1 (H) 09/28/2018   HGBA1C 6.2 (H) 05/30/2018   HGBA1C 6.1 01/28/2018   HGBA1C 6.4 10/18/2017   Lab Results  Component Value Date   INSULIN 11.5 06/12/2019   INSULIN 9.8 09/28/2018   INSULIN 15.5 05/30/2018   CBC    Component Value Date/Time   WBC 5.4 01/28/2018 1030   RBC 4.50 01/28/2018 1030   HGB 14.5 01/28/2018 1030   HCT 43.0 01/28/2018 1030   PLT 217.0 01/28/2018 1030   MCV 95.6 01/28/2018 1030   MCH 30.6 05/15/2017 0524   MCHC 33.8 01/28/2018 1030   RDW 13.1 01/28/2018 1030   LYMPHSABS 1.4 05/15/2017 0524   MONOABS 0.6 05/15/2017 0524   EOSABS 0.3 05/15/2017 0524   BASOSABS 0.0 05/15/2017 0524   Iron/TIBC/Ferritin/ %Sat No results found for: IRON, TIBC, FERRITIN, IRONPCTSAT Lipid Panel     Component Value Date/Time   CHOL 227 (H) 05/15/2019 1022   CHOL 226 (H) 09/28/2018 1147   TRIG 246.0 (H) 05/15/2019 1022   HDL 52.50 05/15/2019 1022   HDL 60 09/28/2018 1147   CHOLHDL 4 05/15/2019 1022   VLDL 49.2 (H) 05/15/2019 1022   LDLCALC 134 (H)  09/28/2018 1147   LDLDIRECT 138.0 05/15/2019 1022   Hepatic Function Panel     Component Value Date/Time   PROT 6.9 05/15/2019 1022   PROT 6.7 09/28/2018 1147   ALBUMIN 4.6 05/15/2019 1022   ALBUMIN 4.6 09/28/2018 1147   AST 23 05/15/2019 1022   ALT 24 05/15/2019 1022   ALKPHOS 98 05/15/2019 1022   BILITOT 0.4 05/15/2019 1022   BILITOT 0.3 09/28/2018 1147      Component Value Date/Time   TSH 7.10 (H) 05/15/2019 1022   TSH 7.710 (H) 10/13/2018 1404   TSH 13.390 (H) 09/28/2018 1147     Ref. Range 05/15/2019 10:22  Vitamin D 1, 25 (OH) Total Latest Ref Range: 18 - 72 pg/mL 54    OBESITY BEHAVIORAL INTERVENTION VISIT  Today's visit was # 13   Starting weight: 210 lbs Starting date: 05/30/2018 Today's weight : 212 lbs Today's date: 07/10/2019 Total lbs lost to date: 0    07/10/2019  Height 5\' 7"  (1.702 m)  Weight 212 lb (96.2 kg)  BMI (Calculated) 33.2  BLOOD PRESSURE - SYSTOLIC [id number]  BLOOD PRESSURE - DIASTOLIC 76   Body Fat % 0000000 %  Total Body Water (lbs) 79.2 lbs    ASK: We discussed the diagnosis of obesity with Catherine Coleman today and Catherine Coleman agreed to give [place] permission to discuss obesity behavioral modification therapy today.  ASSESS: Catherine Coleman has the diagnosis of obesity and her BMI today is 33.2 Catherine Coleman is in the action stage of change   ADVISE: Catherine Coleman was educated on the multiple health risks of obesity as well as the benefit of weight loss to improve her health. She was advised of the need for long term treatment and the importance of lifestyle modifications to improve her current health and to decrease her risk of future health problems.  AGREE: Multiple dietary modification options and treatment options were discussed and  Catherine Coleman agreed to follow the recommendations documented in the above note.  ARRANGE: Catherine Coleman was educated  on the importance of frequent visits to treat obesity as outlined per CMS and USPSTF guidelines and agreed to schedule her  next follow up appointment today.  Corey Skains, am acting as transcriptionist for Abby Potash, PA-C I, Abby Potash, PA-C have reviewed above note and agree with its content

## 2019-07-17 ENCOUNTER — Encounter: Payer: Self-pay | Admitting: Family Medicine

## 2019-07-17 ENCOUNTER — Other Ambulatory Visit (HOSPITAL_BASED_OUTPATIENT_CLINIC_OR_DEPARTMENT_OTHER): Payer: Self-pay | Admitting: Family Medicine

## 2019-07-17 ENCOUNTER — Ambulatory Visit (HOSPITAL_BASED_OUTPATIENT_CLINIC_OR_DEPARTMENT_OTHER)
Admission: RE | Admit: 2019-07-17 | Discharge: 2019-07-17 | Disposition: A | Discharge status: Home / Self Care | Payer: Medicare Other | Source: Ambulatory Visit | Attending: Family Medicine | Admitting: Family Medicine

## 2019-07-17 ENCOUNTER — Other Ambulatory Visit: Payer: Self-pay

## 2019-07-17 ENCOUNTER — Ambulatory Visit (INDEPENDENT_AMBULATORY_CARE_PROVIDER_SITE_OTHER): Payer: Medicare Other | Admitting: Family Medicine

## 2019-07-17 ENCOUNTER — Encounter (HOSPITAL_BASED_OUTPATIENT_CLINIC_OR_DEPARTMENT_OTHER): Payer: Self-pay

## 2019-07-17 VITALS — BP 122/88 | HR 77 | Temp 97.8°F | Resp 18 | Ht 67.0 in | Wt 219.4 lb

## 2019-07-17 DIAGNOSIS — Z1231 Encounter for screening mammogram for malignant neoplasm of breast: Secondary | ICD-10-CM | POA: Diagnosis not present

## 2019-07-17 DIAGNOSIS — E2839 Other primary ovarian failure: Secondary | ICD-10-CM

## 2019-07-17 DIAGNOSIS — M7989 Other specified soft tissue disorders: Secondary | ICD-10-CM | POA: Diagnosis not present

## 2019-07-17 DIAGNOSIS — M85832 Other specified disorders of bone density and structure, left forearm: Secondary | ICD-10-CM | POA: Diagnosis not present

## 2019-07-17 DIAGNOSIS — R2242 Localized swelling, mass and lump, left lower limb: Secondary | ICD-10-CM | POA: Diagnosis not present

## 2019-07-17 NOTE — Patient Instructions (Signed)
Hematoma A hematoma is a collection of blood under the skin, in an organ, in a body space, in a joint space, or in other tissue. The blood can thicken (clot) to form a lump that you can see and feel. The lump is often firm and may become sore and tender. Most hematomas get better in a few days to weeks. However, some hematomas may be serious and require medical care. Hematomas can range from very small to very large. What are the causes? This condition is caused by:  A blunt or penetrating injury.  A leakage from a blood vessel under the skin.  Some medical procedures, including surgeries, such as oral surgery, face lifts, and surgeries on the joints.  Some medical conditions that cause bleeding or bruising. There may be multiple hematomas that appear in different areas of the body. What increases the risk? You are more likely to develop this condition if:  You are an older adult.  You use blood thinners. What are the signs or symptoms?  Symptoms of this condition depend on where the hematoma is located.  Common symptoms of a hematoma that is under the skin include:  A firm lump on the body.  Pain and tenderness in the area.  Bruising. Blue, dark blue, purple-red, or yellowish skin (discoloration) may appear at the site of the hematoma if the hematoma is close to the surface of the skin. Common symptoms of a hematoma that is deep in the tissues or body spaces may be less obvious. They include:  A collection of blood in the stomach (intra-abdominal hematoma). This may cause pain in the abdomen, weakness, fainting, and shortness of breath.  A collection of blood in the head (intracranial hematoma). This may cause a headache or symptoms such as weakness, trouble speaking or understanding, or a change in consciousness. How is this diagnosed? This condition is diagnosed based on:  Your medical history.  A physical exam.  Imaging tests, such as an ultrasound or CT scan. These may  be needed if your health care provider suspects a hematoma in deeper tissues or body spaces.  Blood tests. These may be needed if your health care provider believes that the hematoma is caused by a medical condition. How is this treated? Treatment for this condition depends on the cause, size, and location of the hematoma. Treatment may include:  Doing nothing. The majority of hematomas do not need treatment as many of them go away on their own over time.  Surgery or close monitoring. This may be needed for large hematomas or hematomas that affect vital organs.  Medicines. Medicines may be given if there is an underlying medical cause for the hematoma. Follow these instructions at home: Managing pain, stiffness, and swelling   If directed, put ice on the affected area. ? Put ice in a plastic bag. ? Place a towel between your skin and the bag. ? Leave the ice on for 20 minutes, 2-3 times a day for the first couple of days.  If directed, apply heat to the affected area after applying ice for a couple of days. Use the heat source that your health care provider recommends, such as a moist heat pack or a heating pad. ? Place a towel between your skin and the heat source. ? Leave the heat on for 20-30 minutes. ? Remove the heat if your skin turns bright red. This is especially important if you are unable to feel pain, heat, or cold. You may have a greater   risk of getting burned.  Raise (elevate) the affected area above the level of your heart while you are sitting or lying down.  If told, wrap the affected area with an elastic bandage. The bandage applies pressure (compression) to the area, which may help to reduce swelling and promote healing. Do not wrap the bandage too tightly around the affected area.  If your hematoma is on a leg or foot (lower extremity) and is painful, your health care provider may recommend crutches. Use them as told by your health care provider. General instructions   Take over-the-counter and prescription medicines only as told by your health care provider.  Keep all follow-up visits as told by your health care provider. This is important. Contact a health care provider if:  You have a fever.  The swelling or discoloration gets worse.  You develop more hematomas. Get help right away if:  Your pain is worse or your pain is not controlled with medicine.  Your skin over the hematoma breaks or starts bleeding.  Your hematoma is in your chest or abdomen and you have weakness, shortness of breath, or a change in consciousness.  You have a hematoma on your scalp that is caused by a fall or injury, and you also have: ? A headache that gets worse. ? Trouble speaking or understanding speech. ? Weakness. ? Change in alertness or consciousness. Summary  A hematoma is a collection of blood under the skin, in an organ, in a body space, in a joint space, or in other tissue.  This condition usually does not need treatment because many hematomas go away on their own over time.  Large hematomas, or those that may affect vital organs, may need surgical drainage or monitoring. If the hematoma is caused by a medical condition, medicines may be prescribed.  Get help right away if your hematoma breaks or starts to bleed, you have shortness of breath, or you have a headache or trouble speaking after a fall. This information is not intended to replace advice given to you by your health care provider. Make sure you discuss any questions you have with your health care provider. Document Released: 04/14/2004 Document Revised: 02/03/2018 Document Reviewed: 02/03/2018 Elsevier Patient Education  2020 [employer]  

## 2019-07-17 NOTE — Progress Notes (Signed)
Patient ID: Catherine Coleman, female    DOB: 1932/01/25  Age: 85 y.o. MRN: [id number]    Subjective:  Subjective  HPI [name] presents for a mass on L low leg that is painful to touch.  No known injury   No calf pain  No other complaints   Review of Systems  Constitutional: Negative for appetite change, diaphoresis, fatigue and unexpected weight change.  Eyes: Negative for pain, redness and visual disturbance.  Respiratory: Negative for cough, chest tightness, shortness of breath and wheezing.   Cardiovascular: Negative for chest pain, palpitations and leg swelling.  Endocrine: Negative for cold intolerance, heat intolerance, polydipsia, polyphagia and polyuria.  Genitourinary: Negative for difficulty urinating, dysuria and frequency.  Musculoskeletal:       Mass on L low leg  Neurological: Negative for dizziness, light-headedness, numbness and headaches.    History Past Medical History:  Diagnosis Date  . Arthritis   . Atrophic rhinitis 04/21/2016  . Constipation   . Decreased hearing   . Diverticulitis   . Dry eyes   . Fatigue   . Floaters in visual field   . Gall bladder disease   . GERD (gastroesophageal reflux disease)   . Glaucoma   . Hiatal hernia with gastroesophageal reflux   . History of blood transfusion    for Knee replacement  . Hyperlipidemia, mild 04/02/2016  . Hypertension   . Hypothyroidism   . Joint pain   . Migraine aura without headache   . Muscle pain   . Muscle stiffness   . Nasal congestion   . Obesity 04/02/2016  . Osteoarthritis   . Parotiditis 04/21/2016  . Vitamin D deficiency 04/02/2016    She has a past surgical history that includes Total knee arthroplasty (Bilateral, 2007); Shoulder arthroscopy (Bilateral, prior to 2010); Joint replacement (Left); Cholecystectomy (2010); Appendectomy; Tonsillectomy; Total hip arthroplasty (Right, 03/22/2015); cataract surgery (2008); Tubal ligation; Posterior cervical fusion/foraminotomy (N/A, 04/27/2017);  and Mastoidectomy.   Her family history includes Alcoholism in her father; Arthritis in her daughter; Cancer in her maternal grandfather; Dementia in her mother; Diabetes in her father; Heart disease in her father; Hyperlipidemia in her father; Hypertension in her father and mother; Obesity in her father and mother; Stroke in her father.She reports that she has quit smoking. She has never used smokeless tobacco. She reports current alcohol use. She reports that she does not use drugs.  Current Outpatient Medications on File Prior to Visit  Medication Sig Dispense Refill  . brimonidine (ALPHAGAN) 0.2 % ophthalmic solution Place 1 drop into both eyes 2 (two) times daily. 5 mL 12  . famotidine (PEPCID) 40 MG tablet Take 1 tablet (40 mg total) by mouth at bedtime. 90 tablet 1  . levothyroxine (SYNTHROID) 112 MCG tablet Take 1 tablet (112 mcg total) by mouth daily before breakfast. 30 tablet 5  . losartan (COZAAR) 50 MG tablet TAKE ONE (1) TABLET BY MOUTH EACH DAY 90 tablet 1  . Multiple Vitamins-Minerals (MULTIVITAMIN WITH MINERALS) tablet Take 1 tablet by mouth daily.    Marland Kitchen omeprazole (PRILOSEC) 20 MG capsule TAKE ONE (1) CAPSULE EACH DAY BY MOUTH 90 capsule 1  . polyethylene glycol (MIRALAX / GLYCOLAX) packet Take 17 g by mouth daily. 14 each 0   No current facility-administered medications on file prior to visit.      Objective:  Objective  Physical Exam Vitals signs and nursing note reviewed.  Constitutional:      General: She is not in acute distress.  Appearance: She is well-developed.  HENT:     Right Ear: External ear normal.     Left Ear: External ear normal.     Nose: Nose normal.  Eyes:     Pupils: Pupils are equal, round, and reactive to light.  Neck:     Musculoskeletal: Normal range of motion and neck supple.  Cardiovascular:     Rate and Rhythm: Normal rate and regular rhythm.     Heart sounds: Normal heart sounds. No murmur.  Pulmonary:     Effort: Pulmonary effort is  normal. No respiratory distress.     Breath sounds: Normal breath sounds. No wheezing or rales.  Chest:     Chest wall: No tenderness.  Musculoskeletal:        General: Swelling present.     Left lower leg: She exhibits tenderness and swelling.       Legs:  Neurological:     Mental Status: She is alert and oriented to person, place, and time.  Psychiatric:        Behavior: Behavior normal.        Thought Content: Thought content normal.        Judgment: Judgment normal.    BP 122/88 (BP Location: Left Arm, Patient Position: Sitting, Cuff Size: Normal)   Pulse 77   Temp 97.8 F (36.6 C) (Temporal)   Resp 18   Ht 5\' 7"  (1.702 m)   Wt 219 lb 6.4 oz (99.5 kg)   SpO2 96%   BMI 34.36 kg/m  Wt Readings from Last 3 Encounters:  07/17/19 219 lb 6.4 oz (99.5 kg)  07/10/19 212 lb (96.2 kg)  06/26/19 213 lb (96.6 kg)     Lab Results  Component Value Date   WBC 5.4 01/28/2018   HGB 14.5 01/28/2018   HCT 43.0 01/28/2018   PLT 217.0 01/28/2018   GLUCOSE 90 05/15/2019   CHOL 227 (H) 05/15/2019   TRIG 246.0 (H) 05/15/2019   HDL 52.50 05/15/2019   LDLDIRECT 138.0 05/15/2019   LDLCALC 134 (H) 09/28/2018   ALT 24 05/15/2019   AST 23 05/15/2019   NA 133 (L) 05/15/2019   K 4.6 05/15/2019   CL 97 05/15/2019   CREATININE 0.66 05/15/2019   BUN 14 05/15/2019   CO2 26 05/15/2019   TSH 7.10 (H) 05/15/2019   INR 1.05 04/28/2017   HGBA1C 6.3 05/15/2019    Mm 3d Screen Breast Bilateral  Result Date: 06/17/2018 CLINICAL DATA:  Screening. EXAM: DIGITAL SCREENING BILATERAL MAMMOGRAM WITH TOMO AND CAD COMPARISON:  Previous exam(s). ACR Breast Density Category b: There are scattered areas of fibroglandular density. FINDINGS: There are no findings suspicious for malignancy. Images were processed with CAD. IMPRESSION: No mammographic evidence of malignancy. A result letter of this screening mammogram will be mailed directly to the patient. RECOMMENDATION: Screening mammogram in one year.  (Code:SM-B-01Y) BI-RADS CATEGORY  1: Negative. Electronically Signed   By: Lajean Manes M.D.   On: 06/17/2018 12:00     Assessment & Plan:  Plan  I am having [name] maintain her multivitamin with minerals, brimonidine, polyethylene glycol, omeprazole, levothyroxine, famotidine, and losartan.  No orders of the defined types were placed in this encounter.   Problem List Items Addressed This Visit    None    Visit Diagnoses    Mass of soft tissue of lower leg    -  Primary   Relevant Orders   [place] LT LOWER EXTREM LTD SOFT TISSUE NON VASCULAR (Completed)  [place] --? Lipoma -- if it enlarges or becomes more painful--- MRI  Follow-up: Return if symptoms worsen or fail to improve.  Ann Held, DO

## 2019-07-17 NOTE — Progress Notes (Signed)
Saturday Small lump on shin Got bigger over the next few days Hurts when standing, pushing  Aspirin helped with the pain No outdoor activities  Never red or hot or swollen No fever chills No chest pain SOB difficulty breathing  Had vericose vein surgery a while ago and had a blood clot  No recent immobilization or surgery Not a smoker

## 2019-07-18 ENCOUNTER — Other Ambulatory Visit: Payer: Self-pay | Admitting: *Deleted

## 2019-07-18 MED ORDER — VITAMIN D3 50 MCG (2000 UT) PO CAPS: 2000.0000 [IU] | ORAL_CAPSULE | Freq: Every day | ORAL | Status: DC

## 2019-07-18 MED ORDER — CALCIUM CARBONATE 600 MG PO TABS: 600.0000 mg | ORAL_TABLET | Freq: Two times a day (BID) | ORAL | Status: DC

## 2019-07-31 ENCOUNTER — Ambulatory Visit (INDEPENDENT_AMBULATORY_CARE_PROVIDER_SITE_OTHER): Payer: Medicare Other | Admitting: Physician Assistant

## 2019-08-14 ENCOUNTER — Ambulatory Visit (INDEPENDENT_AMBULATORY_CARE_PROVIDER_SITE_OTHER): Payer: Medicare Other | Admitting: Physician Assistant

## 2019-08-24 ENCOUNTER — Other Ambulatory Visit: Payer: Self-pay

## 2019-08-24 ENCOUNTER — Ambulatory Visit (INDEPENDENT_AMBULATORY_CARE_PROVIDER_SITE_OTHER): Payer: Medicare Other | Admitting: Family Medicine

## 2019-08-24 DIAGNOSIS — I10 Essential (primary) hypertension: Secondary | ICD-10-CM | POA: Diagnosis not present

## 2019-08-24 DIAGNOSIS — R739 Hyperglycemia, unspecified: Secondary | ICD-10-CM | POA: Diagnosis not present

## 2019-08-24 DIAGNOSIS — N3941 Urge incontinence: Secondary | ICD-10-CM | POA: Diagnosis not present

## 2019-08-24 MED ORDER — CEFDINIR 300 MG PO CAPS: 300.0000 mg | ORAL_CAPSULE | Freq: Two times a day (BID) | ORAL | 0 refills | Status: AC

## 2019-08-24 MED ORDER — PHENAZOPYRIDINE HCL 200 MG PO TABS: 200.0000 mg | ORAL_TABLET | Freq: Three times a day (TID) | ORAL | 0 refills | Status: DC | PRN

## 2019-08-24 MED ORDER — ALIGN 4 MG PO CAPS: 1.0000 | ORAL_CAPSULE | Freq: Every day | ORAL | 1 refills | Status: DC

## 2019-08-28 NOTE — Assessment & Plan Note (Signed)
Encouraged to monitor and report any concerns no changes to meds. Encouraged heart healthy diet such as the DASH diet and exercise as tolerated.

## 2019-08-28 NOTE — Assessment & Plan Note (Signed)
Patient noting increased frequency, dysuria and abdominal pain. Started on Pyridium and antibiotic, increase hydration and if no resolution will need to come in and give urine sample

## 2019-08-28 NOTE — Progress Notes (Signed)
Virtual Visit via phone Note  I connected with Catherine Coleman on 08/24/19 at 11:20 AM EST by a phone enabled telemedicine application and verified that I am speaking with the correct person using two identifiers.  Location: Patient: home Provider: office   I discussed the limitations of evaluation and management by telemedicine and the availability of in person appointments. The patient expressed understanding and agreed to proceed. CMA was able to get the patient set up on a phone visit after being unable to set up a video visit   Subjective:    Patient ID: Catherine Coleman, female    DOB: 11/11/1931, 85 y.o.   MRN: [id number]  No chief complaint on file.   HPI Patient is in today for evaluation of worsening urinary symptoms. She is noting increased urinary incontinence, frequncy, urgency and dysuria. Notes some pelvic/abdominal discomfort. No fevers or chills. No hematuria. No polyuria. Denies CP/palp/SOB/HA/congestion/fevers/GI c/o. Taking meds as prescribed  Past Medical History:  Diagnosis Date  . Arthritis   . Atrophic rhinitis 04/21/2016  . Constipation   . Decreased hearing   . Diverticulitis   . Dry eyes   . Fatigue   . Floaters in visual field   . Gall bladder disease   . GERD (gastroesophageal reflux disease)   . Glaucoma   . Hiatal hernia with gastroesophageal reflux   . History of blood transfusion    for Knee replacement  . Hyperlipidemia, mild 04/02/2016  . Hypertension   . Hypothyroidism   . Joint pain   . Migraine aura without headache   . Muscle pain   . Muscle stiffness   . Nasal congestion   . Obesity 04/02/2016  . Osteoarthritis   . Parotiditis 04/21/2016  . Vitamin D deficiency 04/02/2016    Past Surgical History:  Procedure Laterality Date  . APPENDECTOMY    . cataract surgery  2008  . CHOLECYSTECTOMY  2010  . JOINT REPLACEMENT Left    2012  . MASTOIDECTOMY    . POSTERIOR CERVICAL FUSION/FORAMINOTOMY N/A 04/27/2017   Procedure: CERVICAL FIVE-SIX   OPEN REDUCTION OF FRACTURE, CERVICAL THREE-SEVEN  DORSAL FIXATION AND FUSION;  Surgeon: Ditty, Kevan Ny, MD;  Location: [place];  Service: Neurosurgery;  Laterality: N/A;  . SHOULDER ARTHROSCOPY Bilateral prior to 2010  . TONSILLECTOMY    . TOTAL HIP ARTHROPLASTY Right 03/22/2015   Procedure: RIGHT TOTAL HIP ARTHROPLASTY ANTERIOR APPROACH;  Surgeon: Dorna Leitz, MD;  Location: [place];  Service: Orthopedics;  Laterality: Right;  . TOTAL KNEE ARTHROPLASTY Bilateral 2007  . TUBAL LIGATION      Family History  Problem Relation Age of Onset  . Dementia Mother   . Hypertension Mother   . Obesity Mother   . Heart disease Father   . Hypertension Father   . Stroke Father   . Diabetes Father   . Hyperlipidemia Father   . Alcoholism Father   . Obesity Father   . Arthritis Daughter   . Cancer Maternal Grandfather        colon cancer    Social History   Socioeconomic History  . Marital status: Widowed    Spouse name: Not on file  . Number of children: Not on file  . Years of education: Not on file  . Highest education level: Not on file  Occupational History  . Occupation: Retired  Tobacco Use  . Smoking status: Former Research scientist (life sciences)  . Smokeless tobacco: Never Used  . Tobacco comment: Stopped 50 years ago.   Substance and  Sexual Activity  . Alcohol use: Yes    Alcohol/week: 0.0 standard drinks    Comment: socially 3-4x/wk  . Drug use: No  . Sexual activity: Never  Other Topics Concern  . Not on file  Social History Narrative   Lives at [facility], widowed 7 years, no dietary restrictions. Retired from neuropsychiatry work.    Social Determinants of Health   Financial Resource Strain:   . Difficulty of Paying Living Expenses: Not on file  Food Insecurity:   . Worried About Charity fundraiser in the Last Year: Not on file  . Ran Out of Food in the Last Year: Not on file  Transportation Needs:   . Lack of Transportation (Medical): Not on file  . Lack of Transportation  (Non-Medical): Not on file  Physical Activity:   . Days of Exercise per Week: Not on file  . Minutes of Exercise per Session: Not on file  Stress:   . Feeling of Stress : Not on file  Social Connections:   . Frequency of Communication with Friends and Family: Not on file  . Frequency of Social Gatherings with Friends and Family: Not on file  . Attends Religious Services: Not on file  . Active Member of Clubs or Organizations: Not on file  . Attends Archivist Meetings: Not on file  . Marital Status: Not on file  Intimate Partner Violence:   . Fear of Current or Ex-Partner: Not on file  . Emotionally Abused: Not on file  . Physically Abused: Not on file  . Sexually Abused: Not on file    Outpatient Medications Prior to Visit  Medication Sig Dispense Refill  . brimonidine (ALPHAGAN) 0.2 % ophthalmic solution Place 1 drop into both eyes 2 (two) times daily. 5 mL 12  . calcium carbonate (CALCIUM 600) 600 MG TABS tablet Take 1 tablet (600 mg total) by mouth 2 (two) times daily with a meal. 30 tablet   . Cholecalciferol (VITAMIN D3) 50 MCG (2000 UT) capsule Take 1 capsule (2,000 Units total) by mouth daily.    . famotidine (PEPCID) 40 MG tablet Take 1 tablet (40 mg total) by mouth at bedtime. 90 tablet 1  . levothyroxine (SYNTHROID) 112 MCG tablet Take 1 tablet (112 mcg total) by mouth daily before breakfast. 30 tablet 5  . losartan (COZAAR) 50 MG tablet TAKE ONE (1) TABLET BY MOUTH EACH DAY 90 tablet 1  . Multiple Vitamins-Minerals (MULTIVITAMIN WITH MINERALS) tablet Take 1 tablet by mouth daily.    Marland Kitchen omeprazole (PRILOSEC) 20 MG capsule TAKE ONE (1) CAPSULE EACH DAY BY MOUTH 90 capsule 1  . polyethylene glycol (MIRALAX / GLYCOLAX) packet Take 17 g by mouth daily. 14 each 0   No facility-administered medications prior to visit.    Allergies  Allergen Reactions  . Other Other (See Comments)    Seasonal allergies(pollen & grass)    Review of Systems  Constitutional:  Positive for malaise/fatigue. Negative for fever.  HENT: Negative for congestion.   Eyes: Negative for blurred vision.  Respiratory: Negative for shortness of breath.   Cardiovascular: Negative for chest pain, palpitations and leg swelling.  Gastrointestinal: Positive for abdominal pain. Negative for blood in stool and nausea.  Genitourinary: Positive for dysuria, frequency and urgency. Negative for hematuria.  Musculoskeletal: Negative for falls.  Skin: Negative for rash.  Neurological: Negative for dizziness, loss of consciousness and headaches.  Endo/Heme/Allergies: Negative for environmental allergies.  Psychiatric/Behavioral: Negative for depression. The patient is not nervous/anxious.  Objective:    Physical Exam unable to obtain via phone  There were no vitals taken for this visit. Wt Readings from Last 3 Encounters:  07/17/19 219 lb 6.4 oz (99.5 kg)  07/10/19 212 lb (96.2 kg)  06/26/19 213 lb (96.6 kg)    Diabetic Foot Exam - Simple   No data filed     Lab Results  Component Value Date   WBC 5.4 01/28/2018   HGB 14.5 01/28/2018   HCT 43.0 01/28/2018   PLT 217.0 01/28/2018   GLUCOSE 90 05/15/2019   CHOL 227 (H) 05/15/2019   TRIG 246.0 (H) 05/15/2019   HDL 52.50 05/15/2019   LDLDIRECT 138.0 05/15/2019   LDLCALC 134 (H) 09/28/2018   ALT 24 05/15/2019   AST 23 05/15/2019   NA 133 (L) 05/15/2019   K 4.6 05/15/2019   CL 97 05/15/2019   CREATININE 0.66 05/15/2019   BUN 14 05/15/2019   CO2 26 05/15/2019   TSH 7.10 (H) 05/15/2019   INR 1.05 04/28/2017   HGBA1C 6.3 05/15/2019    Lab Results  Component Value Date   TSH 7.10 (H) 05/15/2019   Lab Results  Component Value Date   WBC 5.4 01/28/2018   HGB 14.5 01/28/2018   HCT 43.0 01/28/2018   MCV 95.6 01/28/2018   PLT 217.0 01/28/2018   Lab Results  Component Value Date   NA 133 (L) 05/15/2019   K 4.6 05/15/2019   CO2 26 05/15/2019   GLUCOSE 90 05/15/2019   BUN 14 05/15/2019   CREATININE 0.66  05/15/2019   BILITOT 0.4 05/15/2019   ALKPHOS 98 05/15/2019   AST 23 05/15/2019   ALT 24 05/15/2019   PROT 6.9 05/15/2019   ALBUMIN 4.6 05/15/2019   CALCIUM 9.9 05/15/2019   ANIONGAP 8 05/15/2017   GFR 85.07 05/15/2019   Lab Results  Component Value Date   CHOL 227 (H) 05/15/2019   Lab Results  Component Value Date   HDL 52.50 05/15/2019   Lab Results  Component Value Date   LDLCALC 134 (H) 09/28/2018   Lab Results  Component Value Date   TRIG 246.0 (H) 05/15/2019   Lab Results  Component Value Date   CHOLHDL 4 05/15/2019   Lab Results  Component Value Date   HGBA1C 6.3 05/15/2019       Assessment & Plan:   Problem List Items Addressed This Visit    Hypertension    Encouraged to monitor and report any concerns no changes to meds. Encouraged heart healthy diet such as the DASH diet and exercise as tolerated.       Urinary incontinence    Patient noting increased frequency, dysuria and abdominal pain. Started on Pyridium and antibiotic, increase hydration and if no resolution will need to come in and give urine sample      Hyperglycemia     minimize simple carbs. Increase exercise as tolerated.         I am having Catherine Coleman start on cefdinir, phenazopyridine, and Align. I am also having her maintain her multivitamin with minerals, brimonidine, polyethylene glycol, omeprazole, levothyroxine, famotidine, losartan, Vitamin D3, and calcium carbonate.  Meds ordered this encounter  Medications  . cefdinir (OMNICEF) 300 MG capsule    Sig: Take 1 capsule (300 mg total) by mouth 2 (two) times daily for 10 days.    Dispense:  20 capsule    Refill:  0    Needs delivery today  . phenazopyridine (PYRIDIUM) 200 MG tablet  Sig: Take 1 tablet (200 mg total) by mouth 3 (three) times daily as needed for pain.    Dispense:  10 tablet    Refill:  0  . Probiotic Product (ALIGN) 4 MG CAPS    Sig: Take 1 capsule (4 mg total) by mouth daily.    Dispense:  30 capsule     Refill:  1     Penni Homans, MD    I discussed the assessment and treatment plan with the patient. The patient was provided an opportunity to ask questions and all were answered. The patient agreed with the plan and demonstrated an understanding of the instructions.   The patient was advised to call back or seek an in-person evaluation if the symptoms worsen or if the condition fails to improve as anticipated.  I provided 15 minutes of non-face-to-face time during this encounter.   Penni Homans, MD

## 2019-08-28 NOTE — Assessment & Plan Note (Signed)
minimize simple carbs. Increase exercise as tolerated.  

## 2019-08-29 ENCOUNTER — Ambulatory Visit: Payer: Medicare Other | Admitting: Family Medicine

## 2019-08-29 ENCOUNTER — Other Ambulatory Visit: Payer: Self-pay

## 2019-08-29 NOTE — Assessment & Plan Note (Signed)
Monitor weekly at home and notify us if any concerns. no changes to meds. Encouraged heart healthy diet such as the DASH diet and exercise as tolerated.

## 2019-08-29 NOTE — Assessment & Plan Note (Signed)
Supplement and monitor 

## 2019-08-29 NOTE — Assessment & Plan Note (Signed)
hgba1c acceptable, minimize simple carbs. Increase exercise as tolerated.  

## 2019-08-29 NOTE — Progress Notes (Addendum)
  Note written in error, patient not seen

## 2019-08-29 NOTE — Assessment & Plan Note (Signed)
On Levothyroxine, continue to monitor 

## 2019-08-29 NOTE — Assessment & Plan Note (Signed)
Urinary symptoms and discomfort have improved some with treatment with Cefdinir.

## 2019-08-30 ENCOUNTER — Telehealth: Payer: Self-pay | Admitting: Family Medicine

## 2019-08-30 NOTE — Telephone Encounter (Signed)
Pt called in upset that she never received a call for virtual visit 12/15 with Dr. [name]. She called in at one point and was advised that Dr. [name] was running behind but never received the call. Please advise?   Pt rescheduled for 1/19 for an in office at her request. She declined a virtual visit.

## 2019-08-30 NOTE — Telephone Encounter (Signed)
I am not sure how I managed it but I just missed her. I tried to call her tonight to apologize and was not able to leave a message. Please call her and tell her am happy to talk any time Thursday ro Friday and tell her I am sorry this happened.

## 2019-08-31 ENCOUNTER — Other Ambulatory Visit: Payer: Self-pay

## 2019-08-31 ENCOUNTER — Ambulatory Visit (INDEPENDENT_AMBULATORY_CARE_PROVIDER_SITE_OTHER): Payer: Medicare Other | Admitting: Family Medicine

## 2019-08-31 DIAGNOSIS — E038 Other specified hypothyroidism: Secondary | ICD-10-CM

## 2019-08-31 DIAGNOSIS — R739 Hyperglycemia, unspecified: Secondary | ICD-10-CM | POA: Diagnosis not present

## 2019-08-31 DIAGNOSIS — I10 Essential (primary) hypertension: Secondary | ICD-10-CM

## 2019-08-31 NOTE — Telephone Encounter (Signed)
Spoken to patient and warmed transferred and made appointment for today @1640 

## 2019-08-31 NOTE — Telephone Encounter (Signed)
Unable to leave a message for patient to return call back, phone kept ringing. Patient does have appointment on the 19DEC20 with PCP.

## 2019-09-04 NOTE — Progress Notes (Signed)
Virtual Visit via phone Note  I connected with [name] on 09/04/19 at  4:40 PM EST by a phone enabled telemedicine application and verified that I am speaking with the correct person using two identifiers.  Location: Patient: home Provider: office   I discussed the limitations of evaluation and management by telemedicine and the availability of in person appointments. The patient expressed understanding and agreed to proceed. CMA was able to get the patient set up on a phone visit when unable to set up video visit   Subjective:    Patient ID: Catherine Coleman, female    DOB: Dec 18, 1931, 85 y.o.   MRN: [id number]  Chief Complaint  Patient presents with  . Follow-up    HPI Patient is in today for follow up on chronic medical concerns including hypothyroidism, hypertension and more. No recent febrile illness or hospitalizations. appetitie is good, Denies CP/palp/SOB/HA/congestion/fevers/GI or GU c/o. Taking meds as prescribed  Past Medical History:  Diagnosis Date  . Arthritis   . Atrophic rhinitis 04/21/2016  . Constipation   . Decreased hearing   . Diverticulitis   . Dry eyes   . Fatigue   . Floaters in visual field   . Gall bladder disease   . GERD (gastroesophageal reflux disease)   . Glaucoma   . Hiatal hernia with gastroesophageal reflux   . History of blood transfusion    for Knee replacement  . Hyperlipidemia, mild 04/02/2016  . Hypertension   . Hypothyroidism   . Joint pain   . Migraine aura without headache   . Muscle pain   . Muscle stiffness   . Nasal congestion   . Obesity 04/02/2016  . Osteoarthritis   . Parotiditis 04/21/2016  . Vitamin D deficiency 04/02/2016    Past Surgical History:  Procedure Laterality Date  . APPENDECTOMY    . cataract surgery  2008  . CHOLECYSTECTOMY  2010  . JOINT REPLACEMENT Left    2012  . MASTOIDECTOMY    . POSTERIOR CERVICAL FUSION/FORAMINOTOMY N/A 04/27/2017   Procedure: CERVICAL FIVE-SIX  OPEN REDUCTION OF FRACTURE,  CERVICAL THREE-SEVEN  DORSAL FIXATION AND FUSION;  Surgeon: Ditty, Kevan Ny, MD;  Location: [place];  Service: Neurosurgery;  Laterality: N/A;  . SHOULDER ARTHROSCOPY Bilateral prior to 2010  . TONSILLECTOMY    . TOTAL HIP ARTHROPLASTY Right 03/22/2015   Procedure: RIGHT TOTAL HIP ARTHROPLASTY ANTERIOR APPROACH;  Surgeon: Dorna Leitz, MD;  Location: [place];  Service: Orthopedics;  Laterality: Right;  . TOTAL KNEE ARTHROPLASTY Bilateral 2007  . TUBAL LIGATION      Family History  Problem Relation Age of Onset  . Dementia Mother   . Hypertension Mother   . Obesity Mother   . Heart disease Father   . Hypertension Father   . Stroke Father   . Diabetes Father   . Hyperlipidemia Father   . Alcoholism Father   . Obesity Father   . Arthritis Daughter   . Cancer Maternal Grandfather        colon cancer    Social History   Socioeconomic History  . Marital status: Widowed    Spouse name: Not on file  . Number of children: Not on file  . Years of education: Not on file  . Highest education level: Not on file  Occupational History  . Occupation: Retired  Tobacco Use  . Smoking status: Former Research scientist (life sciences)  . Smokeless tobacco: Never Used  . Tobacco comment: Stopped 50 years ago.   Substance and Sexual  Activity  . Alcohol use: Yes    Alcohol/week: 0.0 standard drinks    Comment: socially 3-4x/wk  . Drug use: No  . Sexual activity: Never  Other Topics Concern  . Not on file  Social History Narrative   Lives at [facility], widowed 7 years, no dietary restrictions. Retired from neuropsychiatry work.    Social Determinants of Health   Financial Resource Strain:   . Difficulty of Paying Living Expenses: Not on file  Food Insecurity:   . Worried About Charity fundraiser in the Last Year: Not on file  . Ran Out of Food in the Last Year: Not on file  Transportation Needs:   . Lack of Transportation (Medical): Not on file  . Lack of Transportation (Non-Medical): Not on file    Physical Activity:   . Days of Exercise per Week: Not on file  . Minutes of Exercise per Session: Not on file  Stress:   . Feeling of Stress : Not on file  Social Connections:   . Frequency of Communication with Friends and Family: Not on file  . Frequency of Social Gatherings with Friends and Family: Not on file  . Attends Religious Services: Not on file  . Active Member of Clubs or Organizations: Not on file  . Attends Archivist Meetings: Not on file  . Marital Status: Not on file  Intimate Partner Violence:   . Fear of Current or Ex-Partner: Not on file  . Emotionally Abused: Not on file  . Physically Abused: Not on file  . Sexually Abused: Not on file    Outpatient Medications Prior to Visit  Medication Sig Dispense Refill  . brimonidine (ALPHAGAN) 0.2 % ophthalmic solution Place 1 drop into both eyes 2 (two) times daily. 5 mL 12  . calcium carbonate (CALCIUM 600) 600 MG TABS tablet Take 1 tablet (600 mg total) by mouth 2 (two) times daily with a meal. 30 tablet   . cefdinir (OMNICEF) 300 MG capsule Take 1 capsule (300 mg total) by mouth 2 (two) times daily for 10 days. 20 capsule 0  . Cholecalciferol (VITAMIN D3) 50 MCG (2000 UT) capsule Take 1 capsule (2,000 Units total) by mouth daily.    . famotidine (PEPCID) 40 MG tablet Take 1 tablet (40 mg total) by mouth at bedtime. 90 tablet 1  . levothyroxine (SYNTHROID) 112 MCG tablet Take 1 tablet (112 mcg total) by mouth daily before breakfast. 30 tablet 5  . losartan (COZAAR) 50 MG tablet TAKE ONE (1) TABLET BY MOUTH EACH DAY 90 tablet 1  . Multiple Vitamins-Minerals (MULTIVITAMIN WITH MINERALS) tablet Take 1 tablet by mouth daily.    Marland Kitchen omeprazole (PRILOSEC) 20 MG capsule TAKE ONE (1) CAPSULE EACH DAY BY MOUTH 90 capsule 1  . phenazopyridine (PYRIDIUM) 200 MG tablet Take 1 tablet (200 mg total) by mouth 3 (three) times daily as needed for pain. 10 tablet 0  . polyethylene glycol (MIRALAX / GLYCOLAX) packet Take 17 g by  mouth daily. 14 each 0  . Probiotic Product (ALIGN) 4 MG CAPS Take 1 capsule (4 mg total) by mouth daily. 30 capsule 1   No facility-administered medications prior to visit.    Allergies  Allergen Reactions  . Other Other (See Comments)    Seasonal allergies(pollen & grass)    Review of Systems  Constitutional: Negative for fever and malaise/fatigue.  HENT: Negative for congestion.   Eyes: Negative for blurred vision.  Respiratory: Negative for shortness of breath.  Cardiovascular: Negative for chest pain, palpitations and leg swelling.  Gastrointestinal: Negative for abdominal pain, blood in stool and nausea.  Genitourinary: Negative for dysuria and frequency.  Musculoskeletal: Negative for falls.  Skin: Negative for rash.  Neurological: Negative for dizziness, loss of consciousness and headaches.  Endo/Heme/Allergies: Negative for environmental allergies.  Psychiatric/Behavioral: Negative for depression. The patient is not nervous/anxious.        Objective:    Physical Exam unable to obtain via phone  There were no vitals taken for this visit. Wt Readings from Last 3 Encounters:  07/17/19 219 lb 6.4 oz (99.5 kg)  07/10/19 212 lb (96.2 kg)  06/26/19 213 lb (96.6 kg)    Diabetic Foot Exam - Simple   No data filed     Lab Results  Component Value Date   WBC 5.4 01/28/2018   HGB 14.5 01/28/2018   HCT 43.0 01/28/2018   PLT 217.0 01/28/2018   GLUCOSE 90 05/15/2019   CHOL 227 (H) 05/15/2019   TRIG 246.0 (H) 05/15/2019   HDL 52.50 05/15/2019   LDLDIRECT 138.0 05/15/2019   LDLCALC 134 (H) 09/28/2018   ALT 24 05/15/2019   AST 23 05/15/2019   NA 133 (L) 05/15/2019   K 4.6 05/15/2019   CL 97 05/15/2019   CREATININE 0.66 05/15/2019   BUN 14 05/15/2019   CO2 26 05/15/2019   TSH 7.10 (H) 05/15/2019   INR 1.05 04/28/2017   HGBA1C 6.3 05/15/2019    Lab Results  Component Value Date   TSH 7.10 (H) 05/15/2019   Lab Results  Component Value Date   WBC 5.4  01/28/2018   HGB 14.5 01/28/2018   HCT 43.0 01/28/2018   MCV 95.6 01/28/2018   PLT 217.0 01/28/2018   Lab Results  Component Value Date   NA 133 (L) 05/15/2019   K 4.6 05/15/2019   CO2 26 05/15/2019   GLUCOSE 90 05/15/2019   BUN 14 05/15/2019   CREATININE 0.66 05/15/2019   BILITOT 0.4 05/15/2019   ALKPHOS 98 05/15/2019   AST 23 05/15/2019   ALT 24 05/15/2019   PROT 6.9 05/15/2019   ALBUMIN 4.6 05/15/2019   CALCIUM 9.9 05/15/2019   ANIONGAP 8 05/15/2017   GFR 85.07 05/15/2019   Lab Results  Component Value Date   CHOL 227 (H) 05/15/2019   Lab Results  Component Value Date   HDL 52.50 05/15/2019   Lab Results  Component Value Date   LDLCALC 134 (H) 09/28/2018   Lab Results  Component Value Date   TRIG 246.0 (H) 05/15/2019   Lab Results  Component Value Date   CHOLHDL 4 05/15/2019   Lab Results  Component Value Date   HGBA1C 6.3 05/15/2019       Assessment & Plan:   Problem List Items Addressed This Visit    Hypertension    Monitor and report any concerns. no changes to meds. Encouraged heart healthy diet such as the DASH diet and exercise as tolerated.       Hypothyroidism    Is tolerating the change of levothyroxine and is doing well.       Hyperglycemia    hgba1c acceptable, minimize simple carbs. Increase exercise as tolerated.          I am having [name] maintain her multivitamin with minerals, brimonidine, polyethylene glycol, omeprazole, levothyroxine, famotidine, losartan, Vitamin D3, calcium carbonate, phenazopyridine, and Align.  No orders of the defined types were placed in this encounter.   I discussed the assessment and  treatment plan with the patient. The patient was provided an opportunity to ask questions and all were answered. The patient agreed with the plan and demonstrated an understanding of the instructions.   The patient was advised to call back or seek an in-person evaluation if the symptoms worsen or if the  condition fails to improve as anticipated.  I provided 25 minutes of non-face-to-face time during this encounter.   Penni Homans, MD

## 2019-09-04 NOTE — Assessment & Plan Note (Signed)
hgba1c acceptable, minimize simple carbs. Increase exercise as tolerated.  

## 2019-09-04 NOTE — Assessment & Plan Note (Signed)
Monitor and report any concerns. no changes to meds. Encouraged heart healthy diet such as the DASH diet and exercise as tolerated.  

## 2019-09-04 NOTE — Assessment & Plan Note (Signed)
Is tolerating the change of levothyroxine and is doing well.

## 2019-10-03 ENCOUNTER — Other Ambulatory Visit: Payer: Self-pay

## 2019-10-03 ENCOUNTER — Encounter: Payer: Self-pay | Admitting: Family Medicine

## 2019-10-03 ENCOUNTER — Ambulatory Visit (HOSPITAL_BASED_OUTPATIENT_CLINIC_OR_DEPARTMENT_OTHER)
Admission: RE | Admit: 2019-10-03 | Discharge: 2019-10-03 | Disposition: A | Discharge status: Home / Self Care | Payer: Medicare Other | Source: Ambulatory Visit | Attending: Family Medicine | Admitting: Family Medicine

## 2019-10-03 ENCOUNTER — Ambulatory Visit (INDEPENDENT_AMBULATORY_CARE_PROVIDER_SITE_OTHER): Payer: Medicare Other | Admitting: Family Medicine

## 2019-10-03 VITALS — BP 128/82 | HR 73 | Temp 97.3°F | Resp 18 | Wt 217.8 lb

## 2019-10-03 DIAGNOSIS — E785 Hyperlipidemia, unspecified: Secondary | ICD-10-CM

## 2019-10-03 DIAGNOSIS — J310 Chronic rhinitis: Secondary | ICD-10-CM

## 2019-10-03 DIAGNOSIS — E038 Other specified hypothyroidism: Secondary | ICD-10-CM | POA: Diagnosis not present

## 2019-10-03 DIAGNOSIS — N390 Urinary tract infection, site not specified: Secondary | ICD-10-CM | POA: Diagnosis not present

## 2019-10-03 DIAGNOSIS — J329 Chronic sinusitis, unspecified: Secondary | ICD-10-CM

## 2019-10-03 DIAGNOSIS — R739 Hyperglycemia, unspecified: Secondary | ICD-10-CM

## 2019-10-03 DIAGNOSIS — E559 Vitamin D deficiency, unspecified: Secondary | ICD-10-CM

## 2019-10-03 DIAGNOSIS — R32 Unspecified urinary incontinence: Secondary | ICD-10-CM

## 2019-10-03 DIAGNOSIS — M25551 Pain in right hip: Secondary | ICD-10-CM

## 2019-10-03 DIAGNOSIS — S79911A Unspecified injury of right hip, initial encounter: Secondary | ICD-10-CM | POA: Diagnosis not present

## 2019-10-03 DIAGNOSIS — I10 Essential (primary) hypertension: Secondary | ICD-10-CM | POA: Diagnosis not present

## 2019-10-03 DIAGNOSIS — B9689 Other specified bacterial agents as the cause of diseases classified elsewhere: Secondary | ICD-10-CM | POA: Diagnosis not present

## 2019-10-03 DIAGNOSIS — M1611 Unilateral primary osteoarthritis, right hip: Secondary | ICD-10-CM

## 2019-10-03 LAB — LIPID PANEL
Cholesterol: 206 mg/dL — ABNORMAL HIGH (ref 0–200)
HDL: 54.7 mg/dL (ref >39.00)
LDL Cholesterol: 120 mg/dL — ABNORMAL HIGH (ref 0–99)
NonHDL: 151.73
Total CHOL/HDL Ratio: 4
Triglycerides: 158 mg/dL — ABNORMAL HIGH (ref 0.0–149.0)
VLDL: 31.6 mg/dL (ref 0.0–40.0)

## 2019-10-03 LAB — COMPREHENSIVE METABOLIC PANEL
ALT: 16 U/L (ref 0–35)
AST: 18 U/L (ref 0–37)
Albumin: 4.4 g/dL (ref 3.5–5.2)
Alkaline Phosphatase: 95 U/L (ref 39–117)
BUN: 12 mg/dL (ref 6–23)
CO2: 26 mEq/L (ref 19–32)
Calcium: 9.1 mg/dL (ref 8.4–10.5)
Chloride: 99 mEq/L (ref 96–112)
Creatinine, Ser: 0.68 mg/dL (ref 0.40–1.20)
GFR: 82.11 mL/min (ref >60.00)
Glucose, Bld: 112 mg/dL — ABNORMAL HIGH (ref 70–99)
Potassium: 5 mEq/L (ref 3.5–5.1)
Sodium: 135 mEq/L (ref 135–145)
Total Bilirubin: 0.3 mg/dL (ref 0.2–1.2)
Total Protein: 6.7 g/dL (ref 6.0–8.3)

## 2019-10-03 LAB — CBC
HCT: 40.2 % (ref 36.0–46.0)
Hemoglobin: 13.4 g/dL (ref 12.0–15.0)
MCHC: 33.2 g/dL (ref 30.0–36.0)
MCV: 96.1 fl (ref 78.0–100.0)
Platelets: 223 10*3/uL (ref 150.0–400.0)
RBC: 4.19 Mil/uL (ref 3.87–5.11)
RDW: 13 % (ref 11.5–15.5)
WBC: 5.2 10*3/uL (ref 4.0–10.5)

## 2019-10-03 LAB — TSH: TSH: 4.62 u[IU]/mL — ABNORMAL HIGH (ref 0.35–4.50)

## 2019-10-03 LAB — VITAMIN D 25 HYDROXY (VIT D DEFICIENCY, FRACTURES): VITD: 39.98 ng/mL (ref 30.00–100.00)

## 2019-10-03 LAB — T4, FREE: Free T4: 1.23 ng/dL (ref 0.60–1.60)

## 2019-10-03 MED ORDER — AZELASTINE HCL 0.1 % NA SOLN: 2.0000 | Freq: Two times a day (BID) | NASAL | 2 refills | Status: DC

## 2019-10-03 MED ORDER — OXYBUTYNIN CHLORIDE ER 5 MG PO TB24: 5.0000 mg | ORAL_TABLET | Freq: Every day | ORAL | 1 refills | Status: DC

## 2019-10-03 NOTE — Patient Instructions (Addendum)
Flonase in am and Astelin in pm  Nasal saline twice a day Hip Pain The hip is the joint between the upper legs and the lower pelvis. The bones, cartilage, tendons, and muscles of your hip joint support your body and allow you to move around. Hip pain can range from a minor ache to severe pain in one or both of your hips. The pain may be felt on the inside of the hip joint near the groin, or on the outside near the buttocks and upper thigh. You may also have swelling or stiffness in your hip area. Follow these instructions at home: Managing pain, stiffness, and swelling      If directed, put ice on the painful area. To do this: ? Put ice in a plastic bag. ? Place a towel between your skin and the bag. ? Leave the ice on for 20 minutes, 2-3 times a day.  If directed, apply heat to the affected area as often as told by your health care provider. Use the heat source that your health care provider recommends, such as a moist heat pack or a heating pad. ? Place a towel between your skin and the heat source. ? Leave the heat on for 20-30 minutes. ? Remove the heat if your skin turns bright red. This is especially important if you are unable to feel pain, heat, or cold. You may have a greater risk of getting burned. Activity  Do exercises as told by your health care provider.  Avoid activities that cause pain. General instructions   Take over-the-counter and prescription medicines only as told by your health care provider.  Keep a journal of your symptoms. Write down: ? How often you have hip pain. ? The location of your pain. ? What the pain feels like. ? What makes the pain worse.  Sleep with a pillow between your legs on your most comfortable side.  Keep all follow-up visits as told by your health care provider. This is important. Contact a health care provider if:  You cannot put weight on your leg.  Your pain or swelling continues or gets worse after one week.  It gets  harder to walk.  You have a fever. Get help right away if:  You fall.  You have a sudden increase in pain and swelling in your hip.  Your hip is red or swollen or very tender to touch. Summary  Hip pain can range from a minor ache to severe pain in one or both of your hips.  The pain may be felt on the inside of the hip joint near the groin, or on the outside near the buttocks and upper thigh.  Avoid activities that cause pain.  Write down how often you have hip pain, the location of the pain, what makes it worse, and what it feels like. This information is not intended to replace advice given to you by your health care provider. Make sure you discuss any questions you have with your health care provider. Document Revised: 01/16/2019 Document Reviewed: 01/16/2019 Elsevier Patient Education  [place].

## 2019-10-04 LAB — URINALYSIS, ROUTINE W REFLEX MICROSCOPIC
Bilirubin Urine: NEGATIVE
Hgb urine dipstick: NEGATIVE
Ketones, ur: NEGATIVE
Nitrite: NEGATIVE
RBC / HPF: NONE SEEN (ref 0–?)
Specific Gravity, Urine: 1.01 (ref 1.000–1.030)
Total Protein, Urine: NEGATIVE
Urine Glucose: NEGATIVE
Urobilinogen, UA: 0.2 (ref 0.0–1.0)
pH: 7 (ref 5.0–8.0)

## 2019-10-04 MED ORDER — LEVOTHYROXINE SODIUM 125 MCG PO TABS: 125.0000 ug | ORAL_TABLET | Freq: Every day | ORAL | 0 refills | Status: DC

## 2019-10-05 ENCOUNTER — Other Ambulatory Visit: Payer: Self-pay | Admitting: Family Medicine

## 2019-10-05 LAB — URINE CULTURE
MICRO NUMBER:: 10056418
SPECIMEN QUALITY:: ADEQUATE

## 2019-10-05 MED ORDER — CEFDINIR 300 MG PO CAPS: 300.0000 mg | ORAL_CAPSULE | Freq: Two times a day (BID) | ORAL | 0 refills | Status: AC

## 2019-10-08 DIAGNOSIS — N390 Urinary tract infection, site not specified: Secondary | ICD-10-CM | POA: Insufficient documentation

## 2019-10-08 NOTE — Assessment & Plan Note (Signed)
Continue Astelin

## 2019-10-08 NOTE — Assessment & Plan Note (Signed)
Well treated with Cefdinir and cranberry per culture results

## 2019-10-08 NOTE — Assessment & Plan Note (Signed)
On Levothyroxine, continue to monitor 

## 2019-10-08 NOTE — Assessment & Plan Note (Signed)
Encouraged heart healthy diet, increase exercise, avoid trans fats, consider a krill oil cap daily 

## 2019-10-08 NOTE — Assessment & Plan Note (Signed)
Pain flared recently, xray shows stable hardware s/p THR, she will report if worsens and she is ready for referral back to ortho.

## 2019-10-08 NOTE — Progress Notes (Signed)
Subjective:    Patient ID: Catherine Coleman, female    DOB: 1932/07/17, 86 y.o.   MRN: [id number]  Chief Complaint  Patient presents with  . Follow-up    HPI Patient is in today for follow up on chronic medical concerns. She continues to struggle with urinary incontinence that is worse and causing her to curtail activity. She was noting cloudy urine but says that is better. No dysuria or hematuria. She is having increased right hip pain. No fall or trauma. Is s/p THR in past. No radicular symptoms. She also notes increased nasal congestion and sinus pressure. Denies CP/palp/SOB/HA/fevers/GI c/o. Taking meds as prescribed  Past Medical History:  Diagnosis Date  . Arthritis   . Atrophic rhinitis 04/21/2016  . Constipation   . Decreased hearing   . Diverticulitis   . Dry eyes   . Fatigue   . Floaters in visual field   . Gall bladder disease   . GERD (gastroesophageal reflux disease)   . Glaucoma   . Hiatal hernia with gastroesophageal reflux   . History of blood transfusion    for Knee replacement  . Hyperlipidemia, mild 04/02/2016  . Hypertension   . Hypothyroidism   . Joint pain   . Migraine aura without headache   . Muscle pain   . Muscle stiffness   . Nasal congestion   . Obesity 04/02/2016  . Osteoarthritis   . Parotiditis 04/21/2016  . Vitamin D deficiency 04/02/2016    Past Surgical History:  Procedure Laterality Date  . APPENDECTOMY    . cataract surgery  2008  . CHOLECYSTECTOMY  2010  . JOINT REPLACEMENT Left    2012  . MASTOIDECTOMY    . POSTERIOR CERVICAL FUSION/FORAMINOTOMY N/A 04/27/2017   Procedure: CERVICAL FIVE-SIX  OPEN REDUCTION OF FRACTURE, CERVICAL THREE-SEVEN  DORSAL FIXATION AND FUSION;  Surgeon: Ditty, Kevan Ny, MD;  Location: [place];  Service: Neurosurgery;  Laterality: N/A;  . SHOULDER ARTHROSCOPY Bilateral prior to 2010  . TONSILLECTOMY    . TOTAL HIP ARTHROPLASTY Right 03/22/2015   Procedure: RIGHT TOTAL HIP ARTHROPLASTY ANTERIOR APPROACH;   Surgeon: Dorna Leitz, MD;  Location: [facility];  Service: Orthopedics;  Laterality: Right;  . TOTAL KNEE ARTHROPLASTY Bilateral 2007  . TUBAL LIGATION      Family History  Problem Relation Age of Onset  . Dementia Mother   . Hypertension Mother   . Obesity Mother   . Heart disease Father   . Hypertension Father   . Stroke Father   . Diabetes Father   . Hyperlipidemia Father   . Alcoholism Father   . Obesity Father   . Arthritis Daughter   . Cancer Maternal Grandfather        colon cancer    Social History   Socioeconomic History  . Marital status: Widowed    Spouse name: Not on file  . Number of children: Not on file  . Years of education: Not on file  . Highest education level: Not on file  Occupational History  . Occupation: Retired  Tobacco Use  . Smoking status: Former Research scientist (life sciences)  . Smokeless tobacco: Never Used  . Tobacco comment: Stopped 50 years ago.   Substance and Sexual Activity  . Alcohol use: Yes    Alcohol/week: 0.0 standard drinks    Comment: socially 3-4x/wk  . Drug use: No  . Sexual activity: Never  Other Topics Concern  . Not on file  Social History Narrative   L[facility] at [facility], widowed  7 years, no dietary restrictions. Retired from neuropsychiatry work.    Social Determinants of Health   Financial Resource Strain:   . Difficulty of Paying Living Expenses: Not on file  Food Insecurity:   . Worried About Charity fundraiser in the Last Year: Not on file  . Ran Out of Food in the Last Year: Not on file  Transportation Needs:   . Lack of Transportation (Medical): Not on file  . Lack of Transportation (Non-Medical): Not on file  Physical Activity:   . Days of Exercise per Week: Not on file  . Minutes of Exercise per Session: Not on file  Stress:   . Feeling of Stress : Not on file  Social Connections:   . Frequency of Communication with Friends and Family: Not on file  . Frequency of Social Gatherings with Friends and Family: Not on file  .  Attends Religious Services: Not on file  . Active Member of Clubs or Organizations: Not on file  . Attends Archivist Meetings: Not on file  . Marital Status: Not on file  Intimate Partner Violence:   . Fear of Current or Ex-Partner: Not on file  . Emotionally Abused: Not on file  . Physically Abused: Not on file  . Sexually Abused: Not on file    Outpatient Medications Prior to Visit  Medication Sig Dispense Refill  . brimonidine (ALPHAGAN) 0.2 % ophthalmic solution Place 1 drop into both eyes 2 (two) times daily. 5 mL 12  . calcium carbonate (CALCIUM 600) 600 MG TABS tablet Take 1 tablet (600 mg total) by mouth 2 (two) times daily with a meal. 30 tablet   . Cholecalciferol (VITAMIN D3) 50 MCG (2000 UT) capsule Take 1 capsule (2,000 Units total) by mouth daily.    . famotidine (PEPCID) 40 MG tablet Take 1 tablet (40 mg total) by mouth at bedtime. 90 tablet 1  . levothyroxine (SYNTHROID) 112 MCG tablet Take 1 tablet (112 mcg total) by mouth daily before breakfast. 30 tablet 5  . losartan (COZAAR) 50 MG tablet TAKE ONE (1) TABLET BY MOUTH EACH DAY 90 tablet 1  . Multiple Vitamins-Minerals (MULTIVITAMIN WITH MINERALS) tablet Take 1 tablet by mouth daily.    Marland Kitchen omeprazole (PRILOSEC) 20 MG capsule TAKE ONE (1) CAPSULE EACH DAY BY MOUTH 90 capsule 1  . phenazopyridine (PYRIDIUM) 200 MG tablet Take 1 tablet (200 mg total) by mouth 3 (three) times daily as needed for pain. 10 tablet 0  . polyethylene glycol (MIRALAX / GLYCOLAX) packet Take 17 g by mouth daily. 14 each 0  . Probiotic Product (ALIGN) 4 MG CAPS Take 1 capsule (4 mg total) by mouth daily. 30 capsule 1   No facility-administered medications prior to visit.    Allergies  Allergen Reactions  . Other Other (See Comments)    Seasonal allergies(pollen & grass)    Review of Systems  Constitutional: Negative for fever and malaise/fatigue.  HENT: Positive for congestion and sinus pain.   Eyes: Negative for blurred vision.   Respiratory: Negative for shortness of breath.   Cardiovascular: Negative for chest pain, palpitations and leg swelling.  Gastrointestinal: Negative for abdominal pain, blood in stool and nausea.  Genitourinary: Positive for frequency and urgency. Negative for dysuria.  Musculoskeletal: Positive for joint pain. Negative for falls.  Skin: Negative for rash.  Neurological: Negative for dizziness, loss of consciousness and headaches.  Endo/Heme/Allergies: Negative for environmental allergies.  Psychiatric/Behavioral: Negative for depression. The patient is not nervous/anxious.  Objective:    Physical Exam Vitals and nursing note reviewed.  Constitutional:      General: She is not in acute distress.    Appearance: She is well-developed.  HENT:     Head: Normocephalic and atraumatic.     Nose: Nose normal.  Eyes:     General:        Right eye: No discharge.        Left eye: No discharge.  Cardiovascular:     Rate and Rhythm: Normal rate and regular rhythm.     Heart sounds: No murmur.  Pulmonary:     Effort: Pulmonary effort is normal.     Breath sounds: Normal breath sounds.  Abdominal:     General: Bowel sounds are normal.     Palpations: Abdomen is soft.     Tenderness: There is no abdominal tenderness.  Musculoskeletal:     Cervical back: Normal range of motion and neck supple.  Skin:    General: Skin is warm and dry.  Neurological:     Mental Status: She is alert and oriented to person, place, and time.     BP 128/82 (BP Location: Left Arm, Patient Position: Sitting, Cuff Size: Normal)   Pulse 73   Temp (!) 97.3 F (36.3 C) (Temporal)   Resp 18   Wt 217 lb 12.8 oz (98.8 kg)   SpO2 97%   BMI 34.11 kg/m  Wt Readings from Last 3 Encounters:  10/03/19 217 lb 12.8 oz (98.8 kg)  07/17/19 219 lb 6.4 oz (99.5 kg)  07/10/19 212 lb (96.2 kg)    Diabetic Foot Exam - Simple   No data filed     Lab Results  Component Value Date   WBC 5.2 10/03/2019   HGB  13.4 10/03/2019   HCT 40.2 10/03/2019   PLT 223.0 10/03/2019   GLUCOSE 112 (H) 10/03/2019   CHOL 206 (H) 10/03/2019   TRIG 158.0 (H) 10/03/2019   HDL 54.70 10/03/2019   LDLDIRECT 138.0 05/15/2019   LDLCALC 120 (H) 10/03/2019   ALT 16 10/03/2019   AST 18 10/03/2019   NA 135 10/03/2019   K 5.0 10/03/2019   CL 99 10/03/2019   CREATININE 0.68 10/03/2019   BUN 12 10/03/2019   CO2 26 10/03/2019   TSH 4.62 (H) 10/03/2019   INR 1.05 04/28/2017   HGBA1C 6.3 05/15/2019    Lab Results  Component Value Date   TSH 4.62 (H) 10/03/2019   Lab Results  Component Value Date   WBC 5.2 10/03/2019   HGB 13.4 10/03/2019   HCT 40.2 10/03/2019   MCV 96.1 10/03/2019   PLT 223.0 10/03/2019   Lab Results  Component Value Date   NA 135 10/03/2019   K 5.0 10/03/2019   CO2 26 10/03/2019   GLUCOSE 112 (H) 10/03/2019   BUN 12 10/03/2019   CREATININE 0.68 10/03/2019   BILITOT 0.3 10/03/2019   ALKPHOS 95 10/03/2019   AST 18 10/03/2019   ALT 16 10/03/2019   PROT 6.7 10/03/2019   ALBUMIN 4.4 10/03/2019   CALCIUM 9.1 10/03/2019   ANIONGAP 8 05/15/2017   GFR 82.11 10/03/2019   Lab Results  Component Value Date   CHOL 206 (H) 10/03/2019   Lab Results  Component Value Date   HDL 54.70 10/03/2019   Lab Results  Component Value Date   LDLCALC 120 (H) 10/03/2019   Lab Results  Component Value Date   TRIG 158.0 (H) 10/03/2019   Lab Results  Component  Value Date   CHOLHDL 4 10/03/2019   Lab Results  Component Value Date   HGBA1C 6.3 05/15/2019       Assessment & Plan:   Problem List Items Addressed This Visit    Primary osteoarthritis of right hip (Chronic)    Pain flared recently, xray shows stable hardware s/p THR, she will report if worsens and she is ready for referral back to ortho.       Hypertension - Primary    Well controlled, no changes to meds. Encouraged heart healthy diet such as the DASH diet and exercise as tolerated.       Relevant Orders   CBC (Completed)    TSH (Completed)   T4, free (Completed)   Hypothyroidism    On Levothyroxine, continue to monitor      Relevant Medications   levothyroxine (SYNTHROID) 125 MCG tablet   Other Relevant Orders   T4, free (Completed)   TSH   T4, free   Vitamin D deficiency    Supplement and monitor      Relevant Orders   Vitamin D (25 hydroxy) (Completed)   Hyperlipidemia, mild    Encouraged heart healthy diet, increase exercise, avoid trans fats, consider a krill oil cap daily      Relevant Orders   Lipid panel (Completed)   Atrophic rhinitis    Continue Astelin      Urinary incontinence    Worse over past several months. Started on low dose Oxybutynin as she feels it disrupts her activities significantly enough that she would like to try the medications.       Relevant Medications   oxybutynin (DITROPAN-XL) 5 MG 24 hr tablet   Other Relevant Orders   Urinalysis   Urine Culture (Completed)   Recurrent sinusitis    Cefdinir, mucinex, probiotics and adequate hydration.       Relevant Medications   azelastine (ASTELIN) 0.1 % nasal spray   Hyperglycemia    hgba1c acceptable, minimize simple carbs. Increase exercise as tolerated.       Relevant Orders   Comprehensive metabolic panel (Completed)   UTI due to Klebsiella species    Well treated with Cefdinir and cranberry per culture results       Other Visit Diagnoses    Right hip pain       Relevant Orders   DG Hip Unilat W OR W/O Pelvis 2-3 Views Right (Completed)      I am having [name] start on azelastine, oxybutynin, and levothyroxine. I am also having her maintain her multivitamin with minerals, brimonidine, polyethylene glycol, omeprazole, levothyroxine, famotidine, losartan, Vitamin D3, calcium carbonate, phenazopyridine, and Align.  Meds ordered this encounter  Medications  . azelastine (ASTELIN) 0.1 % nasal spray    Sig: Place 2 sprays into both nostrils 2 (two) times daily. Use in each nostril as directed     Dispense:  30 mL    Refill:  2  . oxybutynin (DITROPAN-XL) 5 MG 24 hr tablet    Sig: Take 1 tablet (5 mg total) by mouth at bedtime.    Dispense:  30 tablet    Refill:  1  . levothyroxine (SYNTHROID) 125 MCG tablet    Sig: Take 1 tablet (125 mcg total) by mouth daily.    Dispense:  90 tablet    Refill:  0     Penni Homans, MD

## 2019-10-08 NOTE — Assessment & Plan Note (Signed)
Supplement and monitor 

## 2019-10-08 NOTE — Assessment & Plan Note (Signed)
hgba1c acceptable, minimize simple carbs. Increase exercise as tolerated.  

## 2019-10-08 NOTE — Assessment & Plan Note (Signed)
Worse over past several months. Started on low dose Oxybutynin as she feels it disrupts her activities significantly enough that she would like to try the medications.

## 2019-10-08 NOTE — Assessment & Plan Note (Signed)
Well controlled, no changes to meds. Encouraged heart healthy diet such as the DASH diet and exercise as tolerated.  °

## 2019-10-08 NOTE — Assessment & Plan Note (Signed)
Cefdinir, mucinex, probiotics and adequate hydration.

## 2019-10-20 DIAGNOSIS — H5213 Myopia, bilateral: Secondary | ICD-10-CM | POA: Diagnosis not present

## 2019-10-20 DIAGNOSIS — H43393 Other vitreous opacities, bilateral: Secondary | ICD-10-CM | POA: Diagnosis not present

## 2019-10-20 DIAGNOSIS — H401434 Capsular glaucoma with pseudoexfoliation of lens, bilateral, indeterminate stage: Secondary | ICD-10-CM | POA: Diagnosis not present

## 2019-10-20 DIAGNOSIS — H47093 Other disorders of optic nerve, not elsewhere classified, bilateral: Secondary | ICD-10-CM | POA: Diagnosis not present

## 2019-10-20 DIAGNOSIS — H52203 Unspecified astigmatism, bilateral: Secondary | ICD-10-CM | POA: Diagnosis not present

## 2019-10-26 DIAGNOSIS — L820 Inflamed seborrheic keratosis: Secondary | ICD-10-CM | POA: Diagnosis not present

## 2019-10-26 DIAGNOSIS — L821 Other seborrheic keratosis: Secondary | ICD-10-CM | POA: Diagnosis not present

## 2019-10-26 DIAGNOSIS — L570 Actinic keratosis: Secondary | ICD-10-CM | POA: Diagnosis not present

## 2019-10-28 ENCOUNTER — Other Ambulatory Visit: Payer: Self-pay | Admitting: Family Medicine

## 2019-11-14 ENCOUNTER — Ambulatory Visit: Payer: Medicare Other | Admitting: Family Medicine

## 2019-11-15 ENCOUNTER — Ambulatory Visit (INDEPENDENT_AMBULATORY_CARE_PROVIDER_SITE_OTHER): Payer: Medicare Other | Admitting: Medical

## 2019-11-15 ENCOUNTER — Other Ambulatory Visit: Payer: Self-pay

## 2019-11-15 ENCOUNTER — Ambulatory Visit (HOSPITAL_BASED_OUTPATIENT_CLINIC_OR_DEPARTMENT_OTHER)
Admission: RE | Admit: 2019-11-15 | Discharge: 2019-11-15 | Disposition: A | Discharge status: Home / Self Care | Payer: Medicare Other | Source: Ambulatory Visit | Attending: Medical | Admitting: Medical

## 2019-11-15 VITALS — BP 112/78 | HR 70 | Temp 96.8°F | Resp 18 | Ht 67.0 in | Wt 220.0 lb

## 2019-11-15 DIAGNOSIS — M25571 Pain in right ankle and joints of right foot: Secondary | ICD-10-CM | POA: Insufficient documentation

## 2019-11-15 LAB — URIC ACID: Uric Acid, Serum: 4.5 mg/dL (ref 2.4–7.0)

## 2019-11-15 MED ORDER — DICLOFENAC SODIUM 75 MG PO TBEC: 75.0000 mg | DELAYED_RELEASE_TABLET | Freq: Two times a day (BID) | ORAL | 0 refills | Status: DC

## 2019-11-15 NOTE — Patient Instructions (Addendum)
You had recent right ankle pain that coincided with wearing a new shoe.  Pain in region of talofibular ligament.  Will get x-ray of right ankle today and a uric acid level.  You can continue rice therapy and will add diclofenac NSAID for 1 week.  Discussion on whether or not ultrasound of lower extremity indicated.  Your calf is not swollen and you do not have pain behind your knee/popliteal area.  I do not think ultrasound indicated but if you develop any calf pain, swelling or pain behind the knee let me know and I will go ahead and order right lower extremity ultrasound.  Follow-up in 10 days or as needed.

## 2019-11-15 NOTE — Progress Notes (Signed)
Subjective:    Patient ID: Catherine Coleman, female    DOB: 1932/01/27, 86 y.o.   MRN: [id number]  HPI   Pt states 10 days ago she was wearing some some shoes that were slip on shoes. This was new shoe. She states lateral aspect of ankle on top of foot/ankle junction hurt. She only wore shoes for hour at most. Now wearing old shoe. She states ankle still throbs. She put some ice on area, used compression sock but took no meds. Low level dull throb. 4/10 at most.   Review of Systems  Constitutional: Negative for chills, fatigue and fever.  Respiratory: Negative for cough, chest tightness, shortness of breath, wheezing and stridor.   Cardiovascular: Negative for chest pain and palpitations.  Gastrointestinal: Negative for abdominal pain.  Genitourinary: Negative for difficulty urinating, frequency, hematuria, urgency and vaginal pain.  Musculoskeletal: Negative for back pain.       Rt ankle pain.   Skin: Negative for rash.  Hematological: Negative for adenopathy. Does not bruise/bleed easily.    Past Medical History:  Diagnosis Date  . Arthritis   . Atrophic rhinitis 04/21/2016  . Constipation   . Decreased hearing   . Diverticulitis   . Dry eyes   . Fatigue   . Floaters in visual field   . Gall bladder disease   . GERD (gastroesophageal reflux disease)   . Glaucoma   . Hiatal hernia with gastroesophageal reflux   . History of blood transfusion    for Knee replacement  . Hyperlipidemia, mild 04/02/2016  . Hypertension   . Hypothyroidism   . Joint pain   . Migraine aura without headache   . Muscle pain   . Muscle stiffness   . Nasal congestion   . Obesity 04/02/2016  . Osteoarthritis   . Parotiditis 04/21/2016  . Vitamin D deficiency 04/02/2016     Social History   Socioeconomic History  . Marital status: Widowed    Spouse name: Not on file  . Number of children: Not on file  . Years of education: Not on file  . Highest education level: Not on file  Occupational  History  . Occupation: Retired  Tobacco Use  . Smoking status: Former Research scientist (life sciences)  . Smokeless tobacco: Never Used  . Tobacco comment: Stopped 50 years ago.   Substance and Sexual Activity  . Alcohol use: Yes    Alcohol/week: 0.0 standard drinks    Comment: socially 3-4x/wk  . Drug use: No  . Sexual activity: Never  Other Topics Concern  . Not on file  Social History Narrative   Lives at [facility], widowed 7 years, no dietary restrictions. Retired from neuropsychiatry work.    Social Determinants of Health   Financial Resource Strain:   . Difficulty of Paying Living Expenses: Not on file  Food Insecurity:   . Worried About Charity fundraiser in the Last Year: Not on file  . Ran Out of Food in the Last Year: Not on file  Transportation Needs:   . Lack of Transportation (Medical): Not on file  . Lack of Transportation (Non-Medical): Not on file  Physical Activity:   . Days of Exercise per Week: Not on file  . Minutes of Exercise per Session: Not on file  Stress:   . Feeling of Stress : Not on file  Social Connections:   . Frequency of Communication with Friends and Family: Not on file  . Frequency of Social Gatherings with Friends and Family: Not  on file  . Attends Religious Services: Not on file  . Active Member of Clubs or Organizations: Not on file  . Attends Archivist Meetings: Not on file  . Marital Status: Not on file  Intimate Partner Violence:   . Fear of Current or Ex-Partner: Not on file  . Emotionally Abused: Not on file  . Physically Abused: Not on file  . Sexually Abused: Not on file    Past Surgical History:  Procedure Laterality Date  . APPENDECTOMY    . cataract surgery  2008  . CHOLECYSTECTOMY  2010  . JOINT REPLACEMENT Left    2012  . MASTOIDECTOMY    . POSTERIOR CERVICAL FUSION/FORAMINOTOMY N/A 04/27/2017   Procedure: CERVICAL FIVE-SIX  OPEN REDUCTION OF FRACTURE, CERVICAL THREE-SEVEN  DORSAL FIXATION AND FUSION;  Surgeon: Ditty,  Kevan Ny, MD;  Location: [place];  Service: Neurosurgery;  Laterality: N/A;  . SHOULDER ARTHROSCOPY Bilateral prior to 2010  . TONSILLECTOMY    . TOTAL HIP ARTHROPLASTY Right 03/22/2015   Procedure: RIGHT TOTAL HIP ARTHROPLASTY ANTERIOR APPROACH;  Surgeon: Dorna Leitz, MD;  Location: [place];  Service: Orthopedics;  Laterality: Right;  . TOTAL KNEE ARTHROPLASTY Bilateral 2007  . TUBAL LIGATION      Family History  Problem Relation Age of Onset  . Dementia Mother   . Hypertension Mother   . Obesity Mother   . Heart disease Father   . Hypertension Father   . Stroke Father   . Diabetes Father   . Hyperlipidemia Father   . Alcoholism Father   . Obesity Father   . Arthritis Daughter   . Cancer Maternal Grandfather        colon cancer    Allergies  Allergen Reactions  . Other Other (See Comments)    Seasonal allergies(pollen & grass)    Current Outpatient Medications on File Prior to Visit  Medication Sig Dispense Refill  . azelastine (ASTELIN) 0.1 % nasal spray Place 2 sprays into both nostrils 2 (two) times daily. Use in each nostril as directed 30 mL 2  . brimonidine (ALPHAGAN) 0.2 % ophthalmic solution Place 1 drop into both eyes 2 (two) times daily. 5 mL 12  . Cholecalciferol (VITAMIN D3) 50 MCG (2000 UT) capsule Take 1 capsule (2,000 Units total) by mouth daily.    . famotidine (PEPCID) 40 MG tablet TAKE ONE TABLET BY MOUTH EVERY NIGHT AT BEDTIME 90 tablet 1  . levothyroxine (SYNTHROID) 125 MCG tablet Take 1 tablet (125 mcg total) by mouth daily. 90 tablet 0  . losartan (COZAAR) 50 MG tablet TAKE ONE (1) TABLET BY MOUTH EACH DAY 90 tablet 1  . Multiple Vitamins-Minerals (MULTIVITAMIN WITH MINERALS) tablet Take 1 tablet by mouth daily.    Marland Kitchen omeprazole (PRILOSEC) 20 MG capsule TAKE ONE (1) CAPSULE EACH DAY BY MOUTH 90 capsule 1  . polyethylene glycol (MIRALAX / GLYCOLAX) packet Take 17 g by mouth daily. 14 each 0  . calcium carbonate (CALCIUM 600) 600 MG TABS tablet Take 1  tablet (600 mg total) by mouth 2 (two) times daily with a meal. (Patient not taking: Reported on 11/15/2019) 30 tablet   . levothyroxine (SYNTHROID) 112 MCG tablet Take 1 tablet (112 mcg total) by mouth daily before breakfast. (Patient not taking: Reported on 11/15/2019) 30 tablet 5  . oxybutynin (DITROPAN-XL) 5 MG 24 hr tablet Take 1 tablet (5 mg total) by mouth at bedtime. (Patient not taking: Reported on 11/15/2019) 30 tablet 1  . phenazopyridine (PYRIDIUM) 200  MG tablet Take 1 tablet (200 mg total) by mouth 3 (three) times daily as needed for pain. (Patient not taking: Reported on 11/15/2019) 10 tablet 0  . Probiotic Product (ALIGN) 4 MG CAPS Take 1 capsule (4 mg total) by mouth daily. (Patient not taking: Reported on 11/15/2019) 30 capsule 1   No current facility-administered medications on file prior to visit.    BP 112/78 (BP Location: Left Arm, Patient Position: Sitting, Cuff Size: Large)   Pulse 70   Temp (!) 96.8 F (36 C) (Temporal)   Resp 18   Ht 5\' 7"  (1.702 m)   Wt 220 lb (99.8 kg)   SpO2 98%   BMI 34.46 kg/m       Objective:   Physical Exam  .General Mental Status- Alert. General Appearance- Not in acute distress.   Skin General: Color- Normal Color. Moisture- Normal Moisture.  Neck Carotid Arteries- Normal color. Moisture- Normal Moisture. No carotid bruits. No JVD.  Chest and Lung Exam Auscultation: Breath Sounds:-Normal.  Cardiovascular Auscultation:Rythm- Regular. Murmurs & Other Heart Sounds:Auscultation of the heart reveals- No Murmurs.  Abdomen Inspection:-Inspeection Normal. Palpation/Percussion:Note:No mass. Palpation and Percussion of the abdomen reveal- Non Tender, Non Distended + BS, no rebound or guarding.    Neurologic Cranial Nerve exam:- CN III-XII intact(No nystagmus), symmetric smile. Strength:- 5/5 equal and symmetric strength both upper and lower extremities.  Rt lower ext- calfs no swollen. Symmetric to left. Negative homans signs both  sides.   Rt ankle- lateral malleolus mild tender particularly over lateral malleolus. Mild pain on top of foot/anle junction. No warmth to skin.      Assessment & Plan:  You had recent right ankle pain that coincided with wearing a new shoe.  Pain in region of talofibular ligament.  Will get x-ray of right ankle today and a uric acid level.  You can continue rice therapy and will add diclofenac NSAID for 1 week.  Discussion on whether or not ultrasound of lower extremity indicated.  Your calf is not swollen and you do not have pain behind your knee/popliteal area.  I do not think ultrasound indicated but if you develop any calf pain, swelling or pain behind the knee let me know and I will go ahead and order right lower extremity ultrasound.  Follow-up in 10 days or as needed.  30 minutes spent with pt today.

## 2019-12-06 DIAGNOSIS — H04123 Dry eye syndrome of bilateral lacrimal glands: Secondary | ICD-10-CM | POA: Diagnosis not present

## 2019-12-06 DIAGNOSIS — H401412 Capsular glaucoma with pseudoexfoliation of lens, right eye, moderate stage: Secondary | ICD-10-CM | POA: Diagnosis not present

## 2019-12-06 DIAGNOSIS — H401421 Capsular glaucoma with pseudoexfoliation of lens, left eye, mild stage: Secondary | ICD-10-CM | POA: Diagnosis not present

## 2019-12-13 ENCOUNTER — Encounter: Payer: Self-pay | Admitting: Family Medicine

## 2019-12-19 ENCOUNTER — Other Ambulatory Visit (INDEPENDENT_AMBULATORY_CARE_PROVIDER_SITE_OTHER): Payer: Medicare Other

## 2019-12-19 ENCOUNTER — Other Ambulatory Visit: Payer: Self-pay

## 2019-12-19 DIAGNOSIS — E038 Other specified hypothyroidism: Secondary | ICD-10-CM | POA: Diagnosis not present

## 2019-12-19 LAB — T4, FREE: Free T4: 1.01 ng/dL (ref 0.60–1.60)

## 2019-12-19 LAB — TSH: TSH: 3.93 u[IU]/mL (ref 0.35–4.50)

## 2019-12-22 ENCOUNTER — Other Ambulatory Visit: Payer: Self-pay | Admitting: Family Medicine

## 2019-12-22 DIAGNOSIS — E038 Other specified hypothyroidism: Secondary | ICD-10-CM

## 2020-01-01 ENCOUNTER — Other Ambulatory Visit: Payer: Self-pay | Admitting: Family Medicine

## 2020-01-08 ENCOUNTER — Other Ambulatory Visit: Payer: Self-pay

## 2020-01-09 NOTE — Progress Notes (Signed)
[facility] [place], [place], [place] [address] [phone]  Date:  01/10/2020   Name:  Catherine Coleman   DOB:  Jun 14, 1932   MRN:  [id number]  PCP:  [name], MD    Chief Complaint: Hip Pain (right hip pain, negative hip xray)   History of Present Illness:  Catherine Coleman is a 86 y.o. very pleasant female patient who presents with the following:  Primary patient of my partner Dr. [name], here today with concern of right hip pain History of hip arthritis, hypothyroidism, hypertension, hyperlipidemia I have not seen her myself previously Covid vaccine- this is done   She is s/p bilateral hip, shoulder and knee replacement All her other joints have done well, but she notes trouble with her right hip since her replacement in 2016  She had a hip film in 09/2019 as follows: DG HIP (WITH OR WITHOUT PELVIS) 2-3V RIGHT COMPARISON:  03/22/2015 FINDINGS: Mild bilateral SI joint sclerosis. Pubic symphysis and rami are intact. Bilateral hip replacements with normal alignment. No acute displaced fracture is seen. There are vascular calcifications. IMPRESSION: Status post bilateral hip replacements without acute osseous abnormality.  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 She lives at [place] and does not have to do steps there, but she needs to work on her strength She notes that she has tripped and fallen on a few occasions, she is not currently using a cane or walker No recent PT but she would be interested in doing this  She is not sure what her co-pay would be for PT which worries  her some  She lives in [place] and would like to see ortho  Second opinion consultation  She was taking a lot of aleve in the past and is trying to avoid this as much as possible She does take tylenol at times  She has less pain when in the water,  and is trying to do some water exercise class    Patient Active Problem List   Diagnosis Date Noted  . UTI due to Klebsiella species 10/08/2019  . Sun-damaged skin 06/02/2019  . Estrogen deficiency 11/13/2018  . . 08/25/2018  . Prediabetes 06/14/2018  . Low back pain 06/12/2018  . Hyperglycemia 01/28/2018  . Recurrent sinusitis 10/24/2017  . Urinary incontinence 10/18/2017  . Physical deconditioning 10/18/2017  . History of cervical fracture 06/09/2017  . Hypoxia 05/14/2017  . Sternal fracture 04/30/2017  . Parotiditis 04/21/2016  . Breast cancer screening 04/21/2016  . Atrophic rhinitis 04/21/2016  . Obesity 04/02/2016  . Diverticulosis of colon without hemorrhage 04/02/2016  . Vitamin D deficiency 04/02/2016  . Hyperlipidemia, mild 04/02/2016  . Hypertension   . Arthritis   . Hiatal hernia with gastroesophageal reflux   . Glaucoma   . Hypothyroidism   . Primary osteoarthritis of right hip 03/22/2015    Past Medical History:  Diagnosis Date  . Arthritis   . Atrophic rhinitis 04/21/2016  . Constipation   . Decreased hearing   . Diverticulitis   . Dry  eyes   . Fatigue   . Floaters in visual field   . Gall bladder disease   . GERD (gastroesophageal reflux disease)   . Glaucoma   . Hiatal hernia with gastroesophageal reflux   . History of blood transfusion    for Knee replacement  . Hyperlipidemia, mild 04/02/2016  . Hypertension   . Hypothyroidism   . Joint pain   . Migraine aura without headache   . Muscle pain   . Muscle stiffness   . Nasal congestion   . Obesity 04/02/2016  . Osteoarthritis   . Parotiditis 04/21/2016  . Vitamin D deficiency 04/02/2016    Past Surgical History:  Procedure Laterality Date  .  APPENDECTOMY    . cataract surgery  2008  . CHOLECYSTECTOMY  2010  . JOINT REPLACEMENT Left    2012  . MASTOIDECTOMY    . POSTERIOR CERVICAL FUSION/FORAMINOTOMY N/A 04/27/2017   Procedure: CERVICAL FIVE-SIX  OPEN REDUCTION OF FRACTURE, CERVICAL THREE-SEVEN  DORSAL FIXATION AND FUSION;  Surgeon: Ditty, Kevan Ny, MD;  Location: [place];  Service: Neurosurgery;  Laterality: N/A;  . SHOULDER ARTHROSCOPY Bilateral prior to 2010  . TONSILLECTOMY    . TOTAL HIP ARTHROPLASTY Right 03/22/2015   Procedure: RIGHT TOTAL HIP ARTHROPLASTY ANTERIOR APPROACH;  Surgeon: Dorna Leitz, MD;  Location: [place];  Service: Orthopedics;  Laterality: Right;  . TOTAL KNEE ARTHROPLASTY Bilateral 2007  . TUBAL LIGATION      Social History   Tobacco Use  . Smoking status: Former Research scientist (life sciences)  . Smokeless tobacco: Never Used  . Tobacco comment: Stopped 50 years ago.   Substance Use Topics  . Alcohol use: Yes    Alcohol/week: 0.0 standard drinks    Comment: socially 3-4x/wk  . Drug use: No    Family History  Problem Relation Age of Onset  . Dementia Mother   . Hypertension Mother   . Obesity Mother   . Heart disease Father   . Hypertension Father   . Stroke Father   . Diabetes Father   . Hyperlipidemia Father   . Alcoholism Father   . Obesity Father   . Arthritis Daughter   . Cancer Maternal Grandfather        colon cancer    Allergies  Allergen Reactions  . Other Other (See Comments)    Seasonal allergies(pollen & grass)    Medication list has been reviewed and updated.  Current Outpatient Medications on File Prior to Visit  Medication Sig Dispense Refill  . Biotin 10000 MCG TABS Take by mouth.    . brimonidine (ALPHAGAN) 0.2 % ophthalmic solution Place 1 drop into both eyes 2 (two) times daily. 5 mL 12  . famotidine (PEPCID) 40 MG tablet TAKE ONE TABLET BY MOUTH EVERY NIGHT AT BEDTIME 90 tablet 1  . levothyroxine (SYNTHROID) 125 MCG tablet TAKE ONE (1) TABLET BY MOUTH EACH DAY 90 tablet 1  .  losartan (COZAAR) 50 MG tablet Take 1 tablet (50 mg total) by mouth daily. 90 tablet 1  . Multiple Vitamins-Minerals (MULTIVITAMIN WITH MINERALS) tablet Take 1 tablet by mouth daily.    Marland Kitchen omeprazole (PRILOSEC) 20 MG capsule TAKE ONE CAPSULE BY MOUTH DAILY 90 capsule 1  . polyethylene glycol (MIRALAX / GLYCOLAX) packet Take 17 g by mouth daily. 14 each 0  . Probiotic Product (ALIGN) 4 MG CAPS Take 1 capsule (4 mg total) by mouth daily. 30 capsule 1   No current facility-administered medications on file prior to visit.  Review of Systems:  As per HPI- otherwise negative.   Physical Examination: Vitals:   01/10/20 1013  BP: 125/81  Pulse: 86  Resp: 19  Temp: (!) 97.1 F (36.2 C)  SpO2: 93%   Vitals:   01/10/20 1013  Weight: 221 lb (100.2 kg)  Height: 5\' 7"  (1.702 m)   Body mass index is 34.61 kg/m. Ideal Body Weight: Weight in (lb) to have BMI = 25: 159.3  GEN: no acute distress.  Obese, otherwise looks well.  She seems functionally younger than age, except for her hip HEENT: Atraumatic, Normocephalic.  Ears and Nose: No external deformity. CV: RRR, No M/G/R. No JVD. No thrill. No extra heart sounds. PULM: CTA B, no wheezes, crackles, rhonchi. No retractions. No resp. distress. No accessory muscle use. ABD: S, NT, ND, +BS. No rebound. No HSM. EXTR: No c/c/e PSYCH: Normally interactive. Conversant.  Patient uses her left leg to step up onto exam table. She has tenderness over the right SI joint.  She also has discomfort with flexed abduction and abduction of the right hip.  Internal and external rotation is okay.  Normal strength and sensation of both lower extremities    Assessment and Plan: Right hip pain - Plan: Ambulatory referral to Orthopedic Surgery, Ambulatory referral to Physical Therapy  Physical deconditioning - Plan: Ambulatory referral to Physical Therapy  Here today with a right hip problem Naiomy would be interested in seeking a second opinion with  orthopedics.  She would also like to do physical therapy for general strengthening, which I think would be helpful.  She has tripped and fallen a few times, I suggested that she use a cane for safety.  Placed referral for physical therapy-she plans to do this at her assisted living facility, [facility], but if this does not work out I can make referral to another facility Moderate medical decision making today This visit occurred during the SARS-CoV-2 public health emergency.  Safety protocols were in place, including screening questions prior to the visit, additional usage of staff PPE, and extensive cleaning of exam room while observing appropriate contact time as indicated for disinfecting solutions.    Signed Lamar Blinks, MD

## 2020-01-09 NOTE — Patient Instructions (Addendum)
It was nice to see you today- I am sorry your hip is giving you so much trouble We will set you up to see ortho in [place] for a 2nd opinion about your hip We will start you on PT- if you would like to do somewhere other than [facility] let me know Please work on water exercise as you are able.  Voltaren gel, lidocaine patches may be helpful as well Consider a cane for the time being to reduce fall risk  Take care!

## 2020-01-10 ENCOUNTER — Ambulatory Visit (INDEPENDENT_AMBULATORY_CARE_PROVIDER_SITE_OTHER): Payer: Medicare Other | Admitting: Family Medicine

## 2020-01-10 ENCOUNTER — Other Ambulatory Visit: Payer: Self-pay

## 2020-01-10 ENCOUNTER — Encounter: Payer: Self-pay | Admitting: Family Medicine

## 2020-01-10 VITALS — BP 125/81 | HR 86 | Temp 97.1°F | Resp 19 | Ht 67.0 in | Wt 221.0 lb

## 2020-01-10 DIAGNOSIS — M25551 Pain in right hip: Secondary | ICD-10-CM | POA: Diagnosis not present

## 2020-01-10 DIAGNOSIS — R5381 Other malaise: Secondary | ICD-10-CM

## 2020-01-12 ENCOUNTER — Telehealth: Payer: Self-pay | Admitting: Family Medicine

## 2020-01-12 NOTE — Telephone Encounter (Signed)
Caller: [facility]   Call Back # [phone]   Per [place], they only received one page of referral. Please resend    Please Advise

## 2020-01-12 NOTE — Telephone Encounter (Signed)
refaxed referral via [facility] to [facility] Fax # [phone]

## 2020-01-15 DIAGNOSIS — M25551 Pain in right hip: Secondary | ICD-10-CM | POA: Diagnosis not present

## 2020-01-15 DIAGNOSIS — R262 Difficulty in walking, not elsewhere classified: Secondary | ICD-10-CM | POA: Diagnosis not present

## 2020-01-15 DIAGNOSIS — Z9181 History of falling: Secondary | ICD-10-CM | POA: Diagnosis not present

## 2020-01-15 DIAGNOSIS — M6281 Muscle weakness (generalized): Secondary | ICD-10-CM | POA: Diagnosis not present

## 2020-01-18 DIAGNOSIS — Z9181 History of falling: Secondary | ICD-10-CM | POA: Diagnosis not present

## 2020-01-18 DIAGNOSIS — R262 Difficulty in walking, not elsewhere classified: Secondary | ICD-10-CM | POA: Diagnosis not present

## 2020-01-18 DIAGNOSIS — M25551 Pain in right hip: Secondary | ICD-10-CM | POA: Diagnosis not present

## 2020-01-18 DIAGNOSIS — M6281 Muscle weakness (generalized): Secondary | ICD-10-CM | POA: Diagnosis not present

## 2020-01-22 ENCOUNTER — Encounter: Payer: Self-pay | Admitting: Family Medicine

## 2020-01-22 ENCOUNTER — Other Ambulatory Visit: Payer: Self-pay

## 2020-01-22 ENCOUNTER — Ambulatory Visit (INDEPENDENT_AMBULATORY_CARE_PROVIDER_SITE_OTHER): Payer: Medicare Other | Admitting: Family Medicine

## 2020-01-22 VITALS — BP 134/84 | HR 97 | Temp 95.5°F | Ht 65.0 in | Wt 216.5 lb

## 2020-01-22 DIAGNOSIS — I10 Essential (primary) hypertension: Secondary | ICD-10-CM | POA: Diagnosis not present

## 2020-01-22 MED ORDER — FAMOTIDINE 40 MG PO TABS: 40.0000 mg | ORAL_TABLET | Freq: Every day | ORAL | 1 refills | Status: DC

## 2020-01-22 NOTE — Patient Instructions (Addendum)
I would consider an upper arm cuff.  Keep the diet clean and stay active.  Let us know if you need anything.

## 2020-01-22 NOTE — Progress Notes (Signed)
Chief Complaint  Patient presents with  . Hypertension    Subjective Catherine Coleman is a 86 y.o. female who presents for hypertension follow up. She does monitor home blood pressures. Blood pressures ranging from high 100-200's/90's on average at home (wrist cuff doesn't fit all the way around) and at PT (rushes in and they don't recheck) She feels fine otherwise.  She is compliant with medication- losartan 50 mg/d. Patient has these side effects of medication: none She is not always adhering to a healthy diet overall. Current exercise: Working with PT   Past Medical History:  Diagnosis Date  . Arthritis   . Atrophic rhinitis 04/21/2016  . Constipation   . Decreased hearing   . Diverticulitis   . Dry eyes   . Fatigue   . Floaters in visual field   . Gall bladder disease   . GERD (gastroesophageal reflux disease)   . Glaucoma   . Hiatal hernia with gastroesophageal reflux   . History of blood transfusion    for Knee replacement  . Hyperlipidemia, mild 04/02/2016  . Hypertension   . Hypothyroidism   . Joint pain   . Migraine aura without headache   . Muscle pain   . Muscle stiffness   . Nasal congestion   . Obesity 04/02/2016  . Osteoarthritis   . Parotiditis 04/21/2016  . Vitamin D deficiency 04/02/2016    Exam BP 134/84 (BP Location: Left Arm, Patient Position: Sitting, Cuff Size: Large)   Pulse 97   Temp (!) 95.5 F (35.3 C) (Temporal)   Ht 5\' 5"  (1.651 m)   Wt 216 lb 8 oz (98.2 kg)   SpO2 93%   BMI 36.03 kg/m  General:  well developed, well nourished, in no apparent distress Heart: RRR, no bruits, trace pitting b/l LE edema Lungs: clear to auscultation, no accessory muscle use Psych: well oriented with normal range of affect and appropriate judgment/insight  Essential hypertension  No changes. Counseled on diet/exercise. Cont ARB. Monitor BP at home, consider an upper arm cuff. F/u as originally scheduled with Dr. [name]. The patient voiced understanding and  agreement to the plan.  [place], DO 01/22/20  11:31 AM

## 2020-01-23 DIAGNOSIS — M6281 Muscle weakness (generalized): Secondary | ICD-10-CM | POA: Diagnosis not present

## 2020-01-23 DIAGNOSIS — M25551 Pain in right hip: Secondary | ICD-10-CM | POA: Diagnosis not present

## 2020-01-23 DIAGNOSIS — Z9181 History of falling: Secondary | ICD-10-CM | POA: Diagnosis not present

## 2020-01-23 DIAGNOSIS — R262 Difficulty in walking, not elsewhere classified: Secondary | ICD-10-CM | POA: Diagnosis not present

## 2020-01-25 DIAGNOSIS — M25551 Pain in right hip: Secondary | ICD-10-CM | POA: Diagnosis not present

## 2020-01-25 DIAGNOSIS — R262 Difficulty in walking, not elsewhere classified: Secondary | ICD-10-CM | POA: Diagnosis not present

## 2020-01-25 DIAGNOSIS — Z9181 History of falling: Secondary | ICD-10-CM | POA: Diagnosis not present

## 2020-01-25 DIAGNOSIS — M6281 Muscle weakness (generalized): Secondary | ICD-10-CM | POA: Diagnosis not present

## 2020-01-29 DIAGNOSIS — R262 Difficulty in walking, not elsewhere classified: Secondary | ICD-10-CM | POA: Diagnosis not present

## 2020-01-29 DIAGNOSIS — H6123 Impacted cerumen, bilateral: Secondary | ICD-10-CM | POA: Diagnosis not present

## 2020-01-29 DIAGNOSIS — Z9181 History of falling: Secondary | ICD-10-CM | POA: Diagnosis not present

## 2020-01-29 DIAGNOSIS — M6281 Muscle weakness (generalized): Secondary | ICD-10-CM | POA: Diagnosis not present

## 2020-01-29 DIAGNOSIS — M25551 Pain in right hip: Secondary | ICD-10-CM | POA: Diagnosis not present

## 2020-01-30 ENCOUNTER — Other Ambulatory Visit: Payer: Self-pay | Admitting: *Deleted

## 2020-01-30 DIAGNOSIS — E038 Other specified hypothyroidism: Secondary | ICD-10-CM

## 2020-01-30 MED ORDER — FAMOTIDINE 40 MG PO TABS: 40.0000 mg | ORAL_TABLET | Freq: Every day | ORAL | 1 refills | Status: DC

## 2020-01-30 MED ORDER — LOSARTAN POTASSIUM 50 MG PO TABS: 50.0000 mg | ORAL_TABLET | Freq: Every day | ORAL | 1 refills | Status: DC

## 2020-01-30 MED ORDER — LEVOTHYROXINE SODIUM 125 MCG PO TABS: ORAL_TABLET | ORAL | 1 refills | Status: DC

## 2020-01-30 MED ORDER — OMEPRAZOLE 20 MG PO CPDR: 20.0000 mg | DELAYED_RELEASE_CAPSULE | Freq: Every day | ORAL | 1 refills | Status: DC

## 2020-01-31 DIAGNOSIS — M25551 Pain in right hip: Secondary | ICD-10-CM | POA: Diagnosis not present

## 2020-01-31 DIAGNOSIS — Z9181 History of falling: Secondary | ICD-10-CM | POA: Diagnosis not present

## 2020-01-31 DIAGNOSIS — R262 Difficulty in walking, not elsewhere classified: Secondary | ICD-10-CM | POA: Diagnosis not present

## 2020-01-31 DIAGNOSIS — M6281 Muscle weakness (generalized): Secondary | ICD-10-CM | POA: Diagnosis not present

## 2020-02-05 ENCOUNTER — Other Ambulatory Visit: Payer: Self-pay | Admitting: Family Medicine

## 2020-02-05 DIAGNOSIS — R262 Difficulty in walking, not elsewhere classified: Secondary | ICD-10-CM | POA: Diagnosis not present

## 2020-02-05 DIAGNOSIS — Z9181 History of falling: Secondary | ICD-10-CM | POA: Diagnosis not present

## 2020-02-05 DIAGNOSIS — M25551 Pain in right hip: Secondary | ICD-10-CM | POA: Diagnosis not present

## 2020-02-05 DIAGNOSIS — M6281 Muscle weakness (generalized): Secondary | ICD-10-CM | POA: Diagnosis not present

## 2020-02-05 MED ORDER — FAMOTIDINE 40 MG PO TABS: 40.0000 mg | ORAL_TABLET | Freq: Every day | ORAL | 1 refills | Status: DC

## 2020-02-07 ENCOUNTER — Encounter: Payer: Self-pay | Admitting: *Deleted

## 2020-02-07 DIAGNOSIS — M25551 Pain in right hip: Secondary | ICD-10-CM | POA: Diagnosis not present

## 2020-02-07 DIAGNOSIS — R262 Difficulty in walking, not elsewhere classified: Secondary | ICD-10-CM | POA: Diagnosis not present

## 2020-02-07 DIAGNOSIS — Z9181 History of falling: Secondary | ICD-10-CM | POA: Diagnosis not present

## 2020-02-07 DIAGNOSIS — M6281 Muscle weakness (generalized): Secondary | ICD-10-CM | POA: Diagnosis not present

## 2020-02-14 DIAGNOSIS — M6281 Muscle weakness (generalized): Secondary | ICD-10-CM | POA: Diagnosis not present

## 2020-02-14 DIAGNOSIS — M25551 Pain in right hip: Secondary | ICD-10-CM | POA: Diagnosis not present

## 2020-02-14 DIAGNOSIS — Z9181 History of falling: Secondary | ICD-10-CM | POA: Diagnosis not present

## 2020-02-14 DIAGNOSIS — R262 Difficulty in walking, not elsewhere classified: Secondary | ICD-10-CM | POA: Diagnosis not present

## 2020-02-19 ENCOUNTER — Telehealth: Payer: Self-pay | Admitting: Family Medicine

## 2020-02-19 DIAGNOSIS — M6281 Muscle weakness (generalized): Secondary | ICD-10-CM | POA: Diagnosis not present

## 2020-02-19 DIAGNOSIS — Z9181 History of falling: Secondary | ICD-10-CM | POA: Diagnosis not present

## 2020-02-19 DIAGNOSIS — M25551 Pain in right hip: Secondary | ICD-10-CM | POA: Diagnosis not present

## 2020-02-19 DIAGNOSIS — R262 Difficulty in walking, not elsewhere classified: Secondary | ICD-10-CM | POA: Diagnosis not present

## 2020-02-19 NOTE — Progress Notes (Signed)
  Chronic Care Management   Outreach Note  02/19/2020 Name: Darenda Fike MRN: [id number] DOB: [birth year]  Referred by: [name], MD Reason for referral : No chief complaint on file.   An unsuccessful telephone outreach was attempted today. The patient was referred to the pharmacist for assistance with care management and care coordination.   This note is not being shared with the patient for the following reason: To respect privacy (The patient or proxy has requested that the information not be shared).  Follow Up Plan:   Earney [place] [facility] Scheduler

## 2020-02-21 DIAGNOSIS — M25551 Pain in right hip: Secondary | ICD-10-CM | POA: Diagnosis not present

## 2020-02-21 DIAGNOSIS — Z9181 History of falling: Secondary | ICD-10-CM | POA: Diagnosis not present

## 2020-02-21 DIAGNOSIS — M6281 Muscle weakness (generalized): Secondary | ICD-10-CM | POA: Diagnosis not present

## 2020-02-21 DIAGNOSIS — R262 Difficulty in walking, not elsewhere classified: Secondary | ICD-10-CM | POA: Diagnosis not present

## 2020-02-26 DIAGNOSIS — M25551 Pain in right hip: Secondary | ICD-10-CM | POA: Diagnosis not present

## 2020-02-26 DIAGNOSIS — M6281 Muscle weakness (generalized): Secondary | ICD-10-CM | POA: Diagnosis not present

## 2020-02-26 DIAGNOSIS — Z9181 History of falling: Secondary | ICD-10-CM | POA: Diagnosis not present

## 2020-02-26 DIAGNOSIS — R262 Difficulty in walking, not elsewhere classified: Secondary | ICD-10-CM | POA: Diagnosis not present

## 2020-03-05 ENCOUNTER — Ambulatory Visit (INDEPENDENT_AMBULATORY_CARE_PROVIDER_SITE_OTHER): Payer: Medicare Other | Admitting: Medical

## 2020-03-05 ENCOUNTER — Other Ambulatory Visit: Payer: Self-pay

## 2020-03-05 ENCOUNTER — Ambulatory Visit (HOSPITAL_BASED_OUTPATIENT_CLINIC_OR_DEPARTMENT_OTHER)
Admission: RE | Admit: 2020-03-05 | Discharge: 2020-03-05 | Disposition: A | Discharge status: Home / Self Care | Payer: Medicare Other | Source: Ambulatory Visit | Attending: Medical | Admitting: Medical

## 2020-03-05 VITALS — BP 143/83 | HR 78 | Resp 18 | Ht 65.0 in | Wt 220.0 lb

## 2020-03-05 DIAGNOSIS — M25532 Pain in left wrist: Secondary | ICD-10-CM | POA: Insufficient documentation

## 2020-03-05 DIAGNOSIS — S6992XA Unspecified injury of left wrist, hand and finger(s), initial encounter: Secondary | ICD-10-CM | POA: Diagnosis not present

## 2020-03-05 LAB — URIC ACID: Uric Acid, Serum: 4.7 mg/dL (ref 2.4–7.0)

## 2020-03-05 LAB — C-REACTIVE PROTEIN: CRP: <1 mg/dL (ref 0.5–20.0)

## 2020-03-05 LAB — SEDIMENTATION RATE: Sed Rate: 4 mm/hr (ref 0–30)

## 2020-03-05 MED ORDER — KETOROLAC TROMETHAMINE 60 MG/2ML IM SOLN
60.0000 mg | Freq: Once | INTRAMUSCULAR | Status: AC
  Administered 2020-03-05: 60 mg via INTRAMUSCULAR

## 2020-03-05 NOTE — Patient Instructions (Signed)
For wrist pain will get xray and inflammatory labs.  Will give toradol 60 mg im. After review of xray and labs decide on further treatment/medications.  Use your brace daily.  Follow up 7-10 days or as needed

## 2020-03-05 NOTE — Progress Notes (Signed)
Subjective:    Patient ID: Catherine Coleman, female    DOB: Feb 06, 1932, 86 y.o.   MRN: [id number]  HPI  Pt states her left wrist hurts for 2-3 weeks. Started to hurt after doing water aeorobics. Pt is rt handed. No fall or trauma.   She was doing exercises with paddle boards.   Pt has rubbed voltaren over area and that helps some.  Pt tried 2 alleve and did not resolve pain.  Pt is not diabetic.  On movement of wrist presently. Pain 6-7/10.    Review of Systems  Constitutional: Negative for chills, fatigue and fever.  Respiratory: Negative for cough, chest tightness, shortness of breath and wheezing.   Cardiovascular: Negative for chest pain and palpitations.  Musculoskeletal:       Wrist pain.  Skin: Negative for rash.  Neurological: Negative for dizziness, tremors, weakness, light-headedness and headaches.  Hematological: Negative for adenopathy. Does not bruise/bleed easily.  Psychiatric/Behavioral: Negative for behavioral problems and confusion.   Past Medical History:  Diagnosis Date  . Arthritis   . Atrophic rhinitis 04/21/2016  . Constipation   . Decreased hearing   . Diverticulitis   . Dry eyes   . Fatigue   . Floaters in visual field   . Gall bladder disease   . GERD (gastroesophageal reflux disease)   . Glaucoma   . Hiatal hernia with gastroesophageal reflux   . History of blood transfusion    for Knee replacement  . Hyperlipidemia, mild 04/02/2016  . Hypertension   . Hypothyroidism   . Joint pain   . Migraine aura without headache   . Muscle pain   . Muscle stiffness   . Nasal congestion   . Obesity 04/02/2016  . Osteoarthritis   . Parotiditis 04/21/2016  . Vitamin D deficiency 04/02/2016     Social History   Socioeconomic History  . Marital status: Widowed    Spouse name: Not on file  . Number of children: Not on file  . Years of education: Not on file  . Highest education level: Not on file  Occupational History  . Occupation: Retired    Tobacco Use  . Smoking status: Former Research scientist (life sciences)  . Smokeless tobacco: Never Used  . Tobacco comment: Stopped 50 years ago.   Vaping Use  . Vaping Use: Never used  Substance and Sexual Activity  . Alcohol use: Yes    Alcohol/week: 0.0 standard drinks    Comment: socially 3-4x/wk  . Drug use: No  . Sexual activity: Never  Other Topics Concern  . Not on file  Social History Narrative   L[facility] at [facility], widowed 7 years, no dietary restrictions. Retired from neuropsychiatry work.    Social Determinants of Health   Financial Resource Strain:   . Difficulty of Paying Living Expenses:   Food Insecurity:   . Worried About Charity fundraiser in the Last Year:   . Arboriculturist in the Last Year:   Transportation Needs:   . Film/video editor (Medical):   Marland Kitchen Lack of Transportation (Non-Medical):   Physical Activity:   . Days of Exercise per Week:   . Minutes of Exercise per Session:   Stress:   . Feeling of Stress :   Social Connections:   . Frequency of Communication with Friends and Family:   . Frequency of Social Gatherings with Friends and Family:   . Attends Religious Services:   . Active Member of Clubs or Organizations:   .  Attends Archivist Meetings:   Marland Kitchen Marital Status:   Intimate Partner Violence:   . Fear of Current or Ex-Partner:   . Emotionally Abused:   Marland Kitchen Physically Abused:   . Sexually Abused:     Past Surgical History:  Procedure Laterality Date  . APPENDECTOMY    . cataract surgery  2008  . CHOLECYSTECTOMY  2010  . JOINT REPLACEMENT Left    2012  . MASTOIDECTOMY    . POSTERIOR CERVICAL FUSION/FORAMINOTOMY N/A 04/27/2017   Procedure: CERVICAL FIVE-SIX  OPEN REDUCTION OF FRACTURE, CERVICAL THREE-SEVEN  DORSAL FIXATION AND FUSION;  Surgeon: Ditty, Kevan Ny, MD;  Location: [place];  Service: Neurosurgery;  Laterality: N/A;  . SHOULDER ARTHROSCOPY Bilateral prior to 2010  . TONSILLECTOMY    . TOTAL HIP ARTHROPLASTY Right 03/22/2015    Procedure: RIGHT TOTAL HIP ARTHROPLASTY ANTERIOR APPROACH;  Surgeon: Dorna Leitz, MD;  Location: [place];  Service: Orthopedics;  Laterality: Right;  . TOTAL KNEE ARTHROPLASTY Bilateral 2007  . TUBAL LIGATION      Family History  Problem Relation Age of Onset  . Dementia Mother   . Hypertension Mother   . Obesity Mother   . Heart disease Father   . Hypertension Father   . Stroke Father   . Diabetes Father   . Hyperlipidemia Father   . Alcoholism Father   . Obesity Father   . Arthritis Daughter   . Cancer Maternal Grandfather        colon cancer    Allergies  Allergen Reactions  . Other Other (See Comments)    Seasonal allergies(pollen & grass)    Current Outpatient Medications on File Prior to Visit  Medication Sig Dispense Refill  . Biotin 10000 MCG TABS Take by mouth.    . brimonidine (ALPHAGAN) 0.2 % ophthalmic solution Place 1 drop into both eyes 2 (two) times daily. 5 mL 12  . famotidine (PEPCID) 40 MG tablet Take 1 tablet (40 mg total) by mouth at bedtime. As needed. 90 tablet 1  . levothyroxine (SYNTHROID) 125 MCG tablet TAKE ONE (1) TABLET BY MOUTH EACH DAY 90 tablet 1  . losartan (COZAAR) 50 MG tablet Take 1 tablet (50 mg total) by mouth daily. 90 tablet 1  . Multiple Vitamins-Minerals (MULTIVITAMIN WITH MINERALS) tablet Take 1 tablet by mouth daily.    Marland Kitchen omeprazole (PRILOSEC) 20 MG capsule Take 1 capsule (20 mg total) by mouth daily. 90 capsule 1  . polyethylene glycol (MIRALAX / GLYCOLAX) packet Take 17 g by mouth daily. 14 each 0  . Probiotic Product (ALIGN) 4 MG CAPS Take 1 capsule (4 mg total) by mouth daily. 30 capsule 1   No current facility-administered medications on file prior to visit.    BP (!) 143/83 (BP Location: Left Arm, Patient Position: Sitting, Cuff Size: Large)   Pulse 78   Resp 18   Ht 5\' 5"  (1.651 m)   Wt 220 lb (99.8 kg)   SpO2 94%   BMI 36.61 kg/m       Objective:   Physical Exam  General- No acute distress. Pleasant  patient. Neck- Full range of motion, no jvd Lungs- Clear, even and unlabored. Heart- regular rate and rhythm. Neurologic- CNII- XII grossly intact.  Left wrist- dorsal aspect tender. Pain distal ulnar region. Faint warmth. Skin color normal except maybe pain bruise proximal. Left hand- good rom. No significant pain on palpation.      Assessment & Plan:  For wrist pain will get xray  and inflammatory labs.  Will give toradol 60 mg im. After review of xray and labs decide on further treatment/medications.  Use your brace daily.  Follow up 7-10 days or as needed  Time spent with patient today was 25  minutes which consisted of chart review, discussing diagnosis, work up treatment and documentation.  Marland Kitchen

## 2020-03-06 ENCOUNTER — Encounter: Payer: Self-pay | Admitting: Medical

## 2020-03-07 ENCOUNTER — Telehealth: Payer: Self-pay | Admitting: Medical

## 2020-03-07 ENCOUNTER — Encounter: Payer: Self-pay | Admitting: Medical

## 2020-03-07 LAB — RHEUMATOID FACTOR: Rheumatoid fact SerPl-aCnc: <14 IU/mL (ref <14)

## 2020-03-07 LAB — ANA: Anti Nuclear Antibody (ANA): POSITIVE — AB

## 2020-03-07 LAB — ANTI-NUCLEAR AB-TITER (ANA TITER): ANA Titer 1: 1:80 {titer} — ABNORMAL HIGH

## 2020-03-07 MED ORDER — PREDNISONE 10 MG PO TABS
ORAL_TABLET | ORAL | 0 refills | Status: DC
Start: 2020-03-07 — End: 2020-04-17

## 2020-03-07 NOTE — Telephone Encounter (Signed)
Rx prednisone sent to pt pharmacy. 

## 2020-03-08 ENCOUNTER — Telehealth: Payer: Self-pay | Admitting: Medical

## 2020-03-08 DIAGNOSIS — M2550 Pain in unspecified joint: Secondary | ICD-10-CM

## 2020-03-08 DIAGNOSIS — R768 Other specified abnormal immunological findings in serum: Secondary | ICD-10-CM

## 2020-03-08 NOTE — Telephone Encounter (Signed)
Refer to rheumatologist 

## 2020-03-13 ENCOUNTER — Telehealth: Payer: Self-pay | Admitting: Family Medicine

## 2020-03-13 NOTE — Progress Notes (Signed)
  Chronic Care Management   Outreach Note  03/13/2020 Name: Catherine Coleman MRN: [id number] DOB: 1932/08/02  Referred by: [name], MD Reason for referral : No chief complaint on file.   A second unsuccessful telephone outreach was attempted today. The patient was referred to pharmacist for assistance with care management and care coordination.  Follow Up Plan:   [place]

## 2020-03-15 ENCOUNTER — Telehealth: Payer: Self-pay | Admitting: Family Medicine

## 2020-03-15 ENCOUNTER — Ambulatory Visit: Payer: Medicare Other | Admitting: Medical

## 2020-03-15 NOTE — Progress Notes (Signed)
  Chronic Care Management   Note  03/15/2020 Name: [name] MRN: [id number] DOB: Aug 27, 1932  Catherine Coleman is a 86 y.o. year old female who is a primary care patient of [name], MD. I reached out to [name] by phone today in response to a referral sent by Catherine Coleman's PCP, [name], MD.   Catherine Coleman was given information about Chronic Care Management services today including:  1. CCM service includes personalized support from designated clinical staff supervised by her physician, including individualized plan of care and coordination with other care providers 2. 24/7 contact phone numbers for assistance for urgent and routine care needs. 3. Service will only be billed when office clinical staff spend 20 minutes or more in a month to coordinate care. 4. Only one practitioner may furnish and bill the service in a calendar month. 5. The patient may stop CCM services at any time (effective at the end of the month) by phone call to the office staff.   Patient agreed to services and verbal consent obtained.   Follow up plan:   [place]

## 2020-04-08 DIAGNOSIS — H401412 Capsular glaucoma with pseudoexfoliation of lens, right eye, moderate stage: Secondary | ICD-10-CM | POA: Diagnosis not present

## 2020-04-08 DIAGNOSIS — H401421 Capsular glaucoma with pseudoexfoliation of lens, left eye, mild stage: Secondary | ICD-10-CM | POA: Diagnosis not present

## 2020-04-08 DIAGNOSIS — H04123 Dry eye syndrome of bilateral lacrimal glands: Secondary | ICD-10-CM | POA: Diagnosis not present

## 2020-04-17 ENCOUNTER — Other Ambulatory Visit: Payer: Self-pay

## 2020-04-17 ENCOUNTER — Encounter: Payer: Self-pay | Admitting: Family Medicine

## 2020-04-17 ENCOUNTER — Ambulatory Visit (INDEPENDENT_AMBULATORY_CARE_PROVIDER_SITE_OTHER): Payer: Medicare Other | Admitting: Family Medicine

## 2020-04-17 VITALS — BP 130/88 | HR 91 | Temp 97.5°F | Ht 65.0 in | Wt 217.5 lb

## 2020-04-17 DIAGNOSIS — M7661 Achilles tendinitis, right leg: Secondary | ICD-10-CM

## 2020-04-17 NOTE — Patient Instructions (Addendum)
Ice/cold pack over area for 10-15 min twice daily.  OK to take Tylenol 1000 mg (2 extra strength tabs) or 975 mg (3 regular strength tabs) every 6 hours as needed.  Send me a message in 2-3 weeks if we are not turning the corner.   Achilles Tendinitis Rehab It is normal to feel mild stretching, pulling, tightness, or discomfort as you do these exercises, but you should stop right away if you feel sudden pain or your pain gets worse.   Stretching and range of motion exercises These exercises warm up your muscles and joints and improve the movement and flexibility of your ankle. These exercises also help to relieve pain, numbness, and tingling. Exercise A: Standing wall calf stretch, knee straight  1. Stand with your hands against a wall. 2. Extend your affected leg behind you and bend your front knee slightly. Keep both of your heels on the floor. 3. Point the toes of your back foot slightly inward. 4. Keeping your heels on the floor and your back knee straight, shift your weight toward the wall. Do not allow your back to arch. You should feel a gentle stretch in your calf. 5. Hold this position for seconds. Repeat 2 times. Complete this stretch 3 times per week Exercise B: Standing wall calf stretch, knee bent 1. Stand with your hands against a wall. 2. Extend your affected leg behind you, and bend your front knee slightly. Keep both of your heels on the floor. 3. Point the toes of your back foot slightly inward. 4. Keeping your heels on the floor, unlock your back knee so that it is bent. You should feel a gentle stretch deep in your calf. 5. Hold this position for 30 seconds. Repeat 2 times. Complete this stretch 3 times per week.  Strengthening exercises These exercises build strength and control of your ankle. Endurance is the ability to use your muscles for a long time, even after they get tired. Exercise C: Plantar flexion with band  1. Sit on the floor with your affected leg  extended. You may put a pillow under your calf to give your foot more room to move. 2. Loop a rubber exercise band or tube around the ball of your affected foot. The ball of your foot is on the walking surface, right under your toes. The band or tube should be slightly tense when your foot is relaxed. If the band or tube slips, you can put on your shoe or put a washcloth between the band and your foot to help it stay in place. 3. Slowly point your toes downward, pushing them away from you. 4. Hold this position for 10 seconds. 5. Slowly release the tension in the band or tube, controlling smoothly until your foot is back to the starting position. Repeat 2 times. Complete this exercise 3 times per week. Exercise D: Heel raise with eccentric lower  1. Stand on a step with the balls of your feet. The ball of your foot is on the walking surface, right under your toes. ? Do not put your heels on the step. ? For balance, rest your hands on the wall or on a railing. 2. Rise up onto the balls of your feet. 3. Keeping your heels up, shift all of your weight to your affected leg and pick up your other leg. 4. Slowly lower your affected leg so your heel drops below the level of the step. 5. Put down your foot. If told by your health care  provider, build up to:  3 sets of 15 repetitions while keeping your knees straight.  3 sets of 15 repetitions while keeping your knees bent as far as told by your health care provider.  Complete this exercise 3 times per week. If this exercise is too easy, try doing it while wearing a backpack with weights in it. Balance exercises These exercises improve or maintain your balance. Balance is important in preventing falls. Exercise E: Single leg stand 1. Without shoes, stand near a railing or in a door frame. Hold on to the railing or door frame as needed. 2. Stand on your affected foot. Keep your big toe down on the floor and try to keep your arch lifted. 3. Hold  this position for 10 seconds. Repeat 2 times. Complete this exercise 3 times per week. If this exercise is too easy, you can try it with your eyes closed or while standing on a pillow. Make sure you discuss any questions you have with your health care provider. Document Released: 04/01/2005 Document Revised: 05/07/2016 Document Reviewed: 05/07/2015 Elsevier Interactive Patient Education  [employer].

## 2020-04-17 NOTE — Progress Notes (Signed)
Musculoskeletal Exam  Patient: Catherine Coleman DOB: 1932/02/24  DOS: 04/17/2020  SUBJECTIVE:  Chief Complaint:   Chief Complaint  Patient presents with  . Ankle Pain    right    Catherine Coleman is a 86 y.o.  female for evaluation and treatment of R ankle pain.   Onset:  1 week ago. No inj or change in activity.  Location: posterior R ankle Character:  dull  Progression of issue:  is unchanged Associated symptoms: swelling, some bruising Treatment: to date has been none.   Neurovascular symptoms: no No recent travel, surgery, prolonged bedrest.   Past Medical History:  Diagnosis Date  . Arthritis   . Atrophic rhinitis 04/21/2016  . Constipation   . Decreased hearing   . Diverticulitis   . Dry eyes   . Fatigue   . Floaters in visual field   . Gall bladder disease   . GERD (gastroesophageal reflux disease)   . Glaucoma   . Hiatal hernia with gastroesophageal reflux   . History of blood transfusion    for Knee replacement  . Hyperlipidemia, mild 04/02/2016  . Hypertension   . Hypothyroidism   . Joint pain   . Migraine aura without headache   . Muscle pain   . Muscle stiffness   . Nasal congestion   . Obesity 04/02/2016  . Osteoarthritis   . Parotiditis 04/21/2016  . Vitamin D deficiency 04/02/2016    Objective: VITAL SIGNS: BP 130/88 (BP Location: Left Arm, Patient Position: Sitting, Cuff Size: Large)   Pulse 91   Temp (!) 97.5 F (36.4 C) (Oral)   Ht 5\' 5"  (1.651 m)   Wt 217 lb 8 oz (98.7 kg)   SpO2 94%   BMI 36.19 kg/m  Constitutional: Well formed, well developed. No acute distress. Thorax & Lungs: No accessory muscle use Musculoskeletal: R ankle.   Normal active range of motion: yes.   Normal passive range of motion: yes Tenderness to palpation: yes over distal peroneal msc and calcaneal tendon Deformity: no Ecchymosis: no Tests positive: None Tests negative: Squeeze, ant drawer Reproduction of pain when standing on tip toes Neurologic: Normal sensory  function.  Psychiatric: Normal mood. Age appropriate judgment and insight. Alert & oriented x 3.    Assessment:  Achilles tendinitis of right lower extremity  Plan: Stretches/exercises, activity as tolerated, ice, Tylenol. Told pt there is no vessel there that would be worrisome for a clot.  F/u prn. The patient voiced understanding and agreement to the plan.   [place], DO 04/17/20  11:56 AM

## 2020-04-18 ENCOUNTER — Ambulatory Visit: Payer: Medicare Other | Admitting: Family Medicine

## 2020-05-22 ENCOUNTER — Other Ambulatory Visit: Payer: Self-pay | Admitting: *Deleted

## 2020-05-22 ENCOUNTER — Telehealth: Payer: Self-pay | Admitting: Pharmacist

## 2020-05-22 DIAGNOSIS — I10 Essential (primary) hypertension: Secondary | ICD-10-CM

## 2020-05-22 DIAGNOSIS — E038 Other specified hypothyroidism: Secondary | ICD-10-CM

## 2020-05-22 NOTE — Progress Notes (Addendum)
    Chronic Care Management Pharmacy Assistant   Name: Katty Fretwell  MRN: [id number] DOB: May 12, 1932  Reason for Encounter: Initial Questions   PCP : [name], MD  Allergies:   Allergies  Allergen Reactions  . Other Other (See Comments)    Seasonal allergies(pollen & grass)    Medications: Outpatient Encounter Medications as of 05/22/2020  Medication Sig  . Biotin 10000 MCG TABS Take by mouth.  . brimonidine (ALPHAGAN) 0.2 % ophthalmic solution Place 1 drop into both eyes 2 (two) times daily.  . famotidine (PEPCID) 40 MG tablet Take 1 tablet (40 mg total) by mouth at bedtime. As needed.  Marland Kitchen levothyroxine (SYNTHROID) 125 MCG tablet TAKE ONE (1) TABLET BY MOUTH EACH DAY  . losartan (COZAAR) 50 MG tablet Take 1 tablet (50 mg total) by mouth daily.  . Multiple Vitamins-Minerals (MULTIVITAMIN WITH MINERALS) tablet Take 1 tablet by mouth daily.  Marland Kitchen omeprazole (PRILOSEC) 20 MG capsule Take 1 capsule (20 mg total) by mouth daily.  . polyethylene glycol (MIRALAX / GLYCOLAX) packet Take 17 g by mouth daily.  . Probiotic Product (ALIGN) 4 MG CAPS Take 1 capsule (4 mg total) by mouth daily.   No facility-administered encounter medications on file as of 05/22/2020.    Current Diagnosis: Patient Active Problem List   Diagnosis Date Noted  . UTI due to Klebsiella species 10/08/2019  . Sun-damaged skin 06/02/2019  . Estrogen deficiency 11/13/2018  . . 08/25/2018  . Prediabetes 06/14/2018  . Low back pain 06/12/2018  . Hyperglycemia 01/28/2018  . Recurrent sinusitis 10/24/2017  . Urinary incontinence 10/18/2017  . Physical deconditioning 10/18/2017  . History of cervical fracture 06/09/2017  . Hypoxia 05/14/2017  . Sternal fracture 04/30/2017  . Parotiditis 04/21/2016  . Breast cancer screening 04/21/2016  . Atrophic rhinitis 04/21/2016  . Obesity 04/02/2016  . Diverticulosis of colon without hemorrhage 04/02/2016  . Vitamin D deficiency 04/02/2016  . Hyperlipidemia, mild  04/02/2016  . Hypertension   . Arthritis   . Hiatal hernia with gastroesophageal reflux   . Glaucoma   . Hypothyroidism   . Primary osteoarthritis of right hip 03/22/2015    Goals Addressed   None     Chronic Care Management   Outreach Note  05/23/2020 Name: Sanyla Summey MRN: [id number] DOB: Oct 01, 1931  Referred by: [name], MD Reason for referral : No chief complaint on file.   An unsuccessful telephone outreach was attempted today. The patient was referred to the pharmacist for assistance with care management and care coordination.    Called patient to review initial questions before their visit with clinical pharmacist. Unable to make contact prior to their visit  Follow-Up:  Pharmacist Review   Fanny Skates, [place] Pharmacist Assistant [phone]

## 2020-05-23 ENCOUNTER — Other Ambulatory Visit: Payer: Self-pay

## 2020-05-23 ENCOUNTER — Ambulatory Visit: Payer: Medicare Other | Admitting: Pharmacist

## 2020-05-23 VITALS — BP 144/97 | HR 89 | Temp 98.2°F | Wt 217.2 lb

## 2020-05-23 DIAGNOSIS — I10 Essential (primary) hypertension: Secondary | ICD-10-CM

## 2020-05-23 DIAGNOSIS — K449 Diaphragmatic hernia without obstruction or gangrene: Secondary | ICD-10-CM

## 2020-05-23 DIAGNOSIS — E785 Hyperlipidemia, unspecified: Secondary | ICD-10-CM

## 2020-05-23 DIAGNOSIS — R7303 Prediabetes: Secondary | ICD-10-CM

## 2020-05-23 NOTE — Chronic Care Management (AMB) (Signed)
Chronic Care Management Pharmacy  Name: Catherine Coleman  MRN: [id number] DOB: [birth year]  Chief Complaint/ HPI  Catherine Coleman,  86 y.o. , female presents for their Initial CCM visit with the clinical pharmacist via telephone due to COVID-19 Pandemic.  PCP : [name], MD  Their chronic conditions include: Hypertension, Hyperlipidemia, Pre-Diabetes, Hypothyroidism, GERD, Osteopenia, Glaucoma  Office Visits: 04/17/20: Visit w/ Dr. Nani Ravens - Right ankle pain. Recommended stretches/exercise as tolerated, ice and tylenol.  03/05/20: Visit w/ Mackie Pai, PA-C - Wrist pain. Xray and inflammatory labs ordered. Toradol 28m IM given. Use brace daily. Followup 7-10 days.  01/22/20: Visit w/ Dr. WNani Ravens-  HTN recheck. No med changes noted. Recommended patient get upper arm cuff.   01/10/20: Visit w/ Dr. CLorelei Pont- Hip pain. Referral to orthopedic surgery, and referral to PT  Consult Visit: 04/08/20: Ophthalmology visit w/ Dr. EAmalia Hailey- Continue brimonidine, use preservative free artificial tears 3-4 times per Catherine Coleman as needed for dryness or irritation.  01/29/20 - Cerumen removal  Medications: Outpatient Encounter Medications as of 05/23/2020  Medication Sig  . Biotin 10000 MCG TABS Take by mouth.  . brimonidine (ALPHAGAN) 0.2 % ophthalmic solution Place 1 drop into both eyes 2 (two) times daily.  .Marland Kitchenlevothyroxine (SYNTHROID) 125 MCG tablet TAKE ONE (1) TABLET BY MOUTH EACH Oshae Simmering  . losartan (COZAAR) 50 MG tablet Take 1 tablet (50 mg total) by mouth daily.  . Multiple Vitamins-Minerals (MULTIVITAMIN WITH MINERALS) tablet Take 1 tablet by mouth daily.  .Marland Kitchenomeprazole (PRILOSEC) 20 MG capsule Take 1 capsule (20 mg total) by mouth daily.  . polyethylene glycol (MIRALAX / GLYCOLAX) packet Take 17 g by mouth daily.  . Probiotic Product (ALIGN) 4 MG CAPS Take 1 capsule (4 mg total) by mouth daily.  . [DISCONTINUED] famotidine (PEPCID) 40 MG tablet Take 1 tablet (40 mg total) by mouth at bedtime. As  needed.   No facility-administered encounter medications on file as of 05/23/2020.   SDOH Screenings   Alcohol Screen:   . Last Alcohol Screening Score (AUDIT): Not on file  Depression (PHQ2-9):   . PHQ-2 Score: Not on file  Financial Resource Strain: Low Risk   . Difficulty of Paying Living Expenses: Not hard at all  Food Insecurity:   . Worried About RCharity fundraiserin the Last Year: Not on file  . Ran Out of Food in the Last Year: Not on file  Housing:   . Last Housing Risk Score: Not on file  Physical Activity:   . Days of Exercise per Week: Not on file  . Minutes of Exercise per Session: Not on file  Social Connections:   . Frequency of Communication with Friends and Family: Not on file  . Frequency of Social Gatherings with Friends and Family: Not on file  . Attends Religious Services: Not on file  . Active Member of Clubs or Organizations: Not on file  . Attends CArchivistMeetings: Not on file  . Marital Status: Not on file  Stress:   . Feeling of Stress : Not on file  Tobacco Use: Medium Risk  . Smoking Tobacco Use: Former Smoker  . Smokeless Tobacco Use: Never Used  Transportation Needs:   . LFilm/video editor(Medical): Not on file  . Lack of Transportation (Non-Medical): Not on file     Current Diagnosis/Assessment:  Goals Addressed            This Visit's Progress   . Chronic Care Management  Pharmacy Care Plan       CARE PLAN ENTRY (see longitudinal plan of care for additional care plan information)  Current Barriers:  . Chronic Disease Management support, education, and care coordination needs related to Hypertension, Hyperlipidemia, Pre-Diabetes, Hypothyroidism, GERD, Osteopenia, Glaucoma   Hypertension BP Readings from Last 3 Encounters:  04/17/20 130/88  03/05/20 (!) 143/83  01/24/20 124/80   . Pharmacist Clinical Goal(s): o Over the next 90 days, patient will work with PharmD and providers to maintain BP goal  <140/90 . Current regimen:  o Losartan $RemoveBe'50mg'mMVAFZCgN$  daily . Interventions: o Requested patient to check blood pressure 1-2 times per week and record . Patient self care activities - Over the next 90 days, patient will: o Check BP 1-2 times per wek, document, and provide at future appointments o Ensure daily salt intake < 2300 mg/Catherine Coleman  Hyperlipidemia Lab Results  Component Value Date/Time   LDLCALC 120 (H) 10/03/2019 11:56 AM   LDLCALC 134 (H) 09/28/2018 11:47 AM   LDLDIRECT 138.0 05/15/2019 10:22 AM   . Pharmacist Clinical Goal(s): o Over the next 180 days, patient will work with PharmD and providers to achieve LDL goal < 100 or not have further increase in LDL . Current regimen:  o Diet and exercise management   . Interventions: o Discussed Lipid goals . Patient self care activities - Over the next 180 days, patient will: o Work to improve diet to help reduce LDL  Pre-Diabetes Lab Results  Component Value Date/Time   HGBA1C 6.3 05/15/2019 10:22 AM   HGBA1C 6.1 (H) 09/28/2018 11:47 AM   . Pharmacist Clinical Goal(s): o Over the next 90 days, patient will work with PharmD and providers to maintain A1c goal <6.5% . Current regimen:  o Diet and exercise management   . Interventions: o Discussed diet and exercise o Discussed A1c goal . Patient self care activities - Over the next 90 days, patient will: o Maintain a1c <6.5%  GERD . Pharmacist Clinical Goal(s) o Over the next 90 days, patient will work with PharmD and providers to reduce symptoms of GERD and polypharmacy . Current regimen:  . Famotidine $RemoveBefo'40mg'GxHAzkodzXY$  daily at bedtime  . Omeprazole $RemoveBefo'20mg'cpnMCNTNFud$  daily . Interventions: o Discussed the risk/benefit of continuation vs discontinuation of long term PPI use  o Discussed possible tapering plan for omeprazole . Patient self care activities - Over the next 90 days, patient will: o Consider implementing omeprazole taperingplan  Osteopenia . Pharmacist Clinical Goal(s) o Over the next 90  days, patient will work with PharmD and providers to reduce risk of fracture due to osteopenia . Current regimen:  o Vitamin D 1000 units daily . Interventions: o Discussed intake of $RemoveB'1200mg'YnslYtom$  of calcium daily through diet and/or supplementation o Discussed intake of [phone] units of vitamin D through supplementation o Recommended patient use calcium citrate vs calcium carbonate . Patient self care activities - Over the next 90 days, patient will: o Consider purchasing calcium citrate o Ensure she is receiving $RemoveBefo'1200mg'yhmgvIoWESV$  of calcium daily   Medication management . Pharmacist Clinical Goal(s): o Over the next 90 days, patient will work with PharmD and providers to maintain optimal medication adherence . Current pharmacy: [facility] . Interventions o Comprehensive medication review performed. o Continue current medication management strategy . Patient self care activities - Over the next 90 days, patient will: o Focus on medication adherence by filling and taking medications appropriately  o Take medications as prescribed o Report any questions or concerns to PharmD and/or  provider(s)  Initial goal documentation       Social Hx:  Lives at [facility] Has 4 daughters ([place], [place], Fab[place] Neighbors) Has 5 grandchildren Widowed in 2010 due to husband having prostate cancer.  Hobbies: spinning, weaving, she is performing a beading show tomorrow at [employer]  She is now back into her social activities at [employer]  Hypertension   BP goal is:  <140/90  Office blood pressures are  BP Readings from Last 3 Encounters:  05/23/20 (!) 144/97  04/17/20 130/88  03/05/20 (!) 143/83   Patient checks BP at home infrequently (has a wrist and arm cuff at home, but does not use. "I don't see the need") Patient home BP readings are ranging: Unable to assess  Patient has failed these meds in the past: None noted  Patient is currently controlled on the following medications:   . Losartan 79m daily  We discussed BP goal and request for patient to check BP 1-2 times per week and record to assure her BP is not too low or too high  Plan -Continue current medications  -Consider checking BP 1-2 times per week and recording    Hyperlipidemia   LDL goal <100  Lipid Panel     Component Value Date/Time   CHOL 206 (H) 10/03/2019 1156   CHOL 226 (H) 09/28/2018 1147   TRIG 158.0 (H) 10/03/2019 1156   HDL 54.70 10/03/2019 1156   HDL 60 09/28/2018 1147   LDLCALC 120 (H) 10/03/2019 1156   LDLCALC 134 (H) 09/28/2018 1147   LDLDIRECT 138.0 05/15/2019 1022    Hepatic Function Latest Ref Rng & Units 10/03/2019 05/15/2019 09/28/2018  Total Protein 6.0 - 8.3 g/dL 6.7 6.9 6.7  Albumin 3.5 - 5.2 g/dL 4.4 4.6 4.6  AST 0 - 37 U/L 18 23 17   ALT 0 - 35 U/L 16 24 13   Alk Phosphatase 39 - 117 U/L 95 98 95  Total Bilirubin 0.2 - 1.2 mg/dL 0.3 0.4 0.3     The ASCVD Risk score (GSpencer, et al., 2013) failed to calculate for the following reasons:   The 2013 ASCVD risk score is only valid for ages 475to 776  Patient has failed these meds in past: None noted  Patient is currently uncontrolled on the following medications:  . None  Diet B- Usually skips L- Salad  D- Chicken breast stuffed with provolone cheese and berrys with cold berry soup Snacks - None Drinks - 2 cups coffee with coffee mate, water (~5 glasses per Catherine Coleman)  Exercise This week started walking with neighbor at 7am for about 20-30 mins. Plans to do this most/every Catherine Coleman of the week. Goes to the pool MWF and walks in the pool for about an hour  Noting patient's age, would not recommend initiation of statin therapy for primary prevention  We discussed:  diet and exercise extensively, Lipid goals  Plan -Continue control with diet and exercise  Pre-Diabetes   A1c goal <6.5%  Recent Relevant Labs: Lab Results  Component Value Date/Time   HGBA1C 6.3 05/15/2019 10:22 AM   HGBA1C 6.1 (H) 09/28/2018  11:47 AM   GFR 82.11 10/03/2019 11:56 AM   GFR 85.07 05/15/2019 10:22 AM     Patient has failed these meds in past: None noted  Patient is currently controlled, but trending up on the following medications: . None  We discussed: a1c goals, diet and exercise as noted above  Plan -Continue control with diet and exercise  Hypothyroidism  Lab Results  Component Value Date/Time   TSH 3.93 12/19/2019 10:51 AM   TSH 4.62 (H) 10/03/2019 11:56 AM   FREET4 1.01 12/19/2019 10:51 AM   FREET4 1.23 10/03/2019 11:56 AM    Patient has failed these meds in past: None noted  Patient is currently controlled on the following medications:  . Levothyroxine 156mg daily  Plan -Continue current medications  GERD   Patient has failed these meds in past: None noted  Patient is currently controlled on the following medications:  . Famotidine 473mdaily at bedtime (uses PRN) . Omeprazole 2034maily  Breakthrough Sx: Minimal, sometimes in middle of night about 3 times per week Breakthrough Tx: Famotidine  We discussed:  the risk/benefit of continuation vs discontinuation of long term PPI use.   Note that patient does have diagnosis of hiatal hernia with GERD, but goal will be to control with famotidine or no medication if possible  Patient is interested in tapering omeprazole.  She states she has tried to stop taking it in the past, but develops symptoms after 2 days of no omeprazole use.  We discussed the tapering plan described below. Will provide a calendar for patient to start tapering plan noting she can supplement with famotidine if she develops symptoms. If she does not tolerate the tapering plan she can resume omeprazole therapy.  Plan Omeprazole Tapering Plan Week 1: Take every other Kerilyn Cortner Week 2: Take every 3rd Catherine Coleman (2 days between doses) Week 3: Take every 4th Catherine Coleman/Twice a week (3 days between doses) Week: 4: Take once a week Week 5: Discontinue    Osteopenia   Last DEXA Scan:  07/17/2019  T-Score forearm radius: -1.7    VITD  Date Value Ref Range Status  10/03/2019 39.98 30.00 - 100.00 ng/mL Final     Patient is not a candidate for pharmacologic treatment  Patient has failed these meds in past: None noted  Patient is currently controlled on the following medications:  . VMarland Kitchentamin D 1000 units daily  We discussed:  Recommend [phone] units of vitamin D daily. Recommend 1200 mg of calcium daily from dietary and supplemental sources.   She notes that calcium carbonate upsets her stomach. Recommended patient try calcium citrate especially since she is still currently using PPI.  Plan -Consider intake of 1200m60m calcium daily through diet and/or supplementation  Glaucoma    Patient has failed these meds in past: None noted  Patient is currently controlled on the following medications: . Brimonidine 0.2% 1 drop both eyes twice daily   Plan -Continue current medications   Vaccines   Reviewed and discussed patient's vaccination history.  Patient is up to date on vaccines   Immunization History  Administered Date(s) Administered  . Fluad Quad(high Dose 65+) 05/30/2019, 06/07/2020  . Hepatitis A, Adult 02/17/2018  . Influenza, High Dose Seasonal PF 06/14/2017, 05/27/2018  . Influenza-Unspecified 06/12/2014, 06/24/2015  . Moderna SARS-COVID-2 Vaccination 09/26/2019, 10/24/2019  . Pneumococcal Conjugate-13 10/17/2013, 04/30/2015  . Pneumococcal Polysaccharide-23 09/14/1998, 09/14/2008, 01/28/2018  . Tetanus 10/17/2013  . Zoster Recombinat (Shingrix) 01/26/2017, 06/28/2017    Medication Management   Pt uses Mail Order pharmacy for all medications Uses pill box? No - feels like she has a system Pt endorses 100% compliance  We discussed benefits of using a pill box, but pt would like to continue her current strategy Patient states she does struggle with opening her medication caps. Encouraged patient to request easy open caps from the mail order  pharmacy.  Plan -Continue  current medication management strategy  -Patient to request easy open caps from mail order pharmacy   Follow up:  1 month check in to assess PPI taper toleration 3 month phone visit  Miscellaneous Meds  Biotin 107mg  Multivitamin Miralax Probiotic (discussed that she can D/C this if not taking an abx or having any GI issues)  KDe Blanch PharmD Clinical Pharmacist [phone]

## 2020-06-02 ENCOUNTER — Other Ambulatory Visit: Payer: Self-pay | Admitting: Family Medicine

## 2020-06-07 ENCOUNTER — Other Ambulatory Visit: Payer: Self-pay

## 2020-06-07 ENCOUNTER — Ambulatory Visit (INDEPENDENT_AMBULATORY_CARE_PROVIDER_SITE_OTHER): Payer: Medicare Other | Admitting: *Deleted

## 2020-06-07 DIAGNOSIS — Z23 Encounter for immunization: Secondary | ICD-10-CM | POA: Diagnosis not present

## 2020-06-07 NOTE — Progress Notes (Signed)
Patient here for high dose flu vaccine.  Vaccine given in left deltoid and patient tolerated well.  

## 2020-06-13 NOTE — Patient Instructions (Signed)
Visit Information  Goals Addressed            This Visit's Progress    Chronic Care Management Pharmacy Care Plan       CARE PLAN ENTRY (see longitudinal plan of care for additional care plan information)  Current Barriers:   Chronic Disease Management support, education, and care coordination needs related to Hypertension, Hyperlipidemia, Pre-Diabetes, Hypothyroidism, GERD, Osteopenia, Glaucoma   Hypertension BP Readings from Last 3 Encounters:  04/17/20 130/88  03/05/20 (!) 143/83  01/24/20 124/80    Pharmacist Clinical Goal(s): o Over the next 90 days, patient will work with PharmD and providers to maintain BP goal <140/90  Current regimen:  o Losartan 50mg  daily  Interventions: o Requested patient to check blood pressure 1-2 times per week and record  Patient self care activities - Over the next 90 days, patient will: o Check BP 1-2 times per wek, document, and provide at future appointments o Ensure daily salt intake < 2300 mg/Tony Friscia  Hyperlipidemia Lab Results  Component Value Date/Time   LDLCALC 120 (H) 10/03/2019 11:56 AM   LDLCALC 134 (H) 09/28/2018 11:47 AM   LDLDIRECT 138.0 05/15/2019 10:22 AM    Pharmacist Clinical Goal(s): o Over the next 180 days, patient will work with PharmD and providers to achieve LDL goal < 100 or not have further increase in LDL  Current regimen:  o Diet and exercise management    Interventions: o Discussed Lipid goals  Patient self care activities - Over the next 180 days, patient will: o Work to improve diet to help reduce LDL  Pre-Diabetes Lab Results  Component Value Date/Time   HGBA1C 6.3 05/15/2019 10:22 AM   HGBA1C 6.1 (H) 09/28/2018 11:47 AM    Pharmacist Clinical Goal(s): o Over the next 90 days, patient will work with PharmD and providers to maintain A1c goal <6.5%  Current regimen:  o Diet and exercise management    Interventions: o Discussed diet and exercise o Discussed A1c goal  Patient self care  activities - Over the next 90 days, patient will: o Maintain a1c <6.5%  GERD  Pharmacist Clinical Goal(s) o Over the next 90 days, patient will work with PharmD and providers to reduce symptoms of GERD and polypharmacy  Current regimen:   Famotidine 40mg  daily at bedtime   Omeprazole 20mg  daily  Interventions: o Discussed the risk/benefit of continuation vs discontinuation of long term PPI use  o Discussed possible tapering plan for omeprazole  Patient self care activities - Over the next 90 days, patient will: o Consider implementing omeprazole taperingplan  Osteopenia  Pharmacist Clinical Goal(s) o Over the next 90 days, patient will work with PharmD and providers to reduce risk of fracture due to osteopenia  Current regimen:  o Vitamin D 1000 units daily  Interventions: o Discussed intake of 1200mg  of calcium daily through diet and/or supplementation o Discussed intake of [phone] units of vitamin D through supplementation o Recommended patient use calcium citrate vs calcium carbonate  Patient self care activities - Over the next 90 days, patient will: o Consider purchasing calcium citrate o Ensure she is receiving 1200mg  of calcium daily   Medication management  Pharmacist Clinical Goal(s): o Over the next 90 days, patient will work with PharmD and providers to maintain optimal medication adherence  Current pharmacy: [facility]  Interventions o Comprehensive medication review performed. o Continue current medication management strategy  Patient self care activities - Over the next 90 days, patient will: o Focus  on medication adherence by filling and taking medications appropriately  o Take medications as prescribed o Report any questions or concerns to PharmD and/or provider(s)  Initial goal documentation        Ms. Warmoth was given information about Chronic Care Management services today including:  1. CCM service includes personalized  support from designated clinical staff supervised by her physician, including individualized plan of care and coordination with other care providers 2. 24/7 contact phone numbers for assistance for urgent and routine care needs. 3. Standard insurance, coinsurance, copays and deductibles apply for chronic care management only during months in which we provide at least 20 minutes of these services. Most insurances cover these services at 100%, however patients may be responsible for any copay, coinsurance and/or deductible if applicable. This service may help you avoid the need for more expensive face-to-face services. 4. Only one practitioner may furnish and bill the service in a calendar month. 5. The patient may stop CCM services at any time (effective at the end of the month) by phone call to the office staff.  Patient agreed to services and verbal consent obtained.   The patient verbalized understanding of instructions provided today and agreed to receive a mailed copy of patient instruction and/or educational materials. Telephone follow up appointment with pharmacy team member scheduled for:  De Blanch, PharmD Clinical Pharmacist [facility] at [facility] [phone]

## 2020-06-27 ENCOUNTER — Telehealth: Payer: Self-pay | Admitting: Pharmacist

## 2020-06-27 NOTE — Progress Notes (Addendum)
    Chronic Care Management Pharmacy Assistant   Name: Catherine Coleman  MRN: [id number] DOB: [birth year]  Reason for Encounter: Medication Review  Patient Questions:  1.  Have you seen any other providers since your last visit?   2.  Any changes in your medicines or health?     PCP : Catherine Lukes, MD  Allergies:   Allergies  Allergen Reactions  . Other Other (See Comments)    Seasonal allergies(pollen & grass)    Medications: Outpatient Encounter Medications as of 06/27/2020  Medication Sig  . Biotin 10000 MCG TABS Take by mouth.  . brimonidine (ALPHAGAN) 0.2 % ophthalmic solution Place 1 drop into both eyes 2 (two) times daily.  . famotidine (PEPCID) 40 MG tablet TAKE 1 TABLET BY MOUTH AT  BEDTIME AS NEEDED  . levothyroxine (SYNTHROID) 125 MCG tablet TAKE ONE (1) TABLET BY MOUTH EACH DAY  . losartan (COZAAR) 50 MG tablet Take 1 tablet (50 mg total) by mouth daily.  . Multiple Vitamins-Minerals (MULTIVITAMIN WITH MINERALS) tablet Take 1 tablet by mouth daily.  Marland Kitchen omeprazole (PRILOSEC) 20 MG capsule Take 1 capsule (20 mg total) by mouth daily.  . polyethylene glycol (MIRALAX / GLYCOLAX) packet Take 17 g by mouth daily.  . Probiotic Product (ALIGN) 4 MG CAPS Take 1 capsule (4 mg total) by mouth daily.   No facility-administered encounter medications on file as of 06/27/2020.    Current Diagnosis: Patient Active Problem List   Diagnosis Date Noted  . UTI due to Klebsiella species 10/08/2019  . Sun-damaged skin 06/02/2019  . Estrogen deficiency 11/13/2018  . . 08/25/2018  . Prediabetes 06/14/2018  . Low back pain 06/12/2018  . Hyperglycemia 01/28/2018  . Recurrent sinusitis 10/24/2017  . Urinary incontinence 10/18/2017  . Physical deconditioning 10/18/2017  . History of cervical fracture 06/09/2017  . Hypoxia 05/14/2017  . Sternal fracture 04/30/2017  . Parotiditis 04/21/2016  . Breast cancer screening 04/21/2016  . Atrophic rhinitis 04/21/2016  . Obesity  04/02/2016  . Diverticulosis of colon without hemorrhage 04/02/2016  . Vitamin D deficiency 04/02/2016  . Hyperlipidemia, mild 04/02/2016  . Hypertension   . Arthritis   . Hiatal hernia with gastroesophageal reflux   . Glaucoma   . Hypothyroidism   . Primary osteoarthritis of right hip 03/22/2015    Goals Addressed   None     Follow-Up:  Pharmacist Review   Called patient and discussed medication adherence with patient, no issues at this time with current medication.  Patient states that she never received the tapering schedule for omeprazole.    Patient denies ED visit since his last CPP follow up. Patient denies any side effects with his medication.  Patient denies any problems with his current pharmacy.  [name], [place] [facility] [place] [phone]   Noted that patient did not receive tapering schedule as discussed at [facility] visit. Will send to patient again.  Reviewed by: De Blanch, PharmD Clinical Pharmacist [facility] [phone]

## 2020-06-29 ENCOUNTER — Other Ambulatory Visit: Payer: Self-pay | Admitting: Family Medicine

## 2020-06-29 DIAGNOSIS — E038 Other specified hypothyroidism: Secondary | ICD-10-CM

## 2020-07-09 ENCOUNTER — Other Ambulatory Visit: Payer: Self-pay | Admitting: Family Medicine

## 2020-07-15 ENCOUNTER — Other Ambulatory Visit (HOSPITAL_BASED_OUTPATIENT_CLINIC_OR_DEPARTMENT_OTHER): Payer: Self-pay | Admitting: Family Medicine

## 2020-07-15 DIAGNOSIS — Z1231 Encounter for screening mammogram for malignant neoplasm of breast: Secondary | ICD-10-CM

## 2020-07-16 ENCOUNTER — Ambulatory Visit (INDEPENDENT_AMBULATORY_CARE_PROVIDER_SITE_OTHER): Payer: Medicare Other | Admitting: Family

## 2020-07-16 ENCOUNTER — Other Ambulatory Visit: Payer: Self-pay

## 2020-07-16 VITALS — BP 136/63 | HR 72 | Temp 97.7°F | Resp 16 | Ht 65.0 in | Wt 213.0 lb

## 2020-07-16 DIAGNOSIS — G8929 Other chronic pain: Secondary | ICD-10-CM | POA: Diagnosis not present

## 2020-07-16 DIAGNOSIS — M545 Low back pain, unspecified: Secondary | ICD-10-CM | POA: Diagnosis not present

## 2020-07-16 DIAGNOSIS — M5416 Radiculopathy, lumbar region: Secondary | ICD-10-CM

## 2020-07-16 NOTE — Patient Instructions (Addendum)
You should be contacted about scheduling your MRI.   Chronic Back Pain When back pain lasts longer than 3 months, it is called chronic back pain.The cause of your back pain may not be known. Some common causes include:  Wear and tear (degenerative disease) of the bones, ligaments, or disks in your back.  Inflammation and stiffness in your back (arthritis). People who have chronic back pain often go through certain periods in which the pain is more intense (flare-ups). Many people can learn to manage the pain with home care. Follow these instructions at home: Pay attention to any changes in your symptoms. Take these actions to help with your pain: Activity   Avoid bending and other activities that make the problem worse.  Maintain a proper position when standing or sitting: ? When standing, keep your upper back and neck straight, with your shoulders pulled back. Avoid slouching. ? When sitting, keep your back straight and relax your shoulders. Do not round your shoulders or pull them backward.  Do not sit or stand in one place for long periods of time.  Take brief periods of rest throughout the day. This will reduce your pain. Resting in a lying or standing position is usually better than sitting to rest.  When you are resting for longer periods, mix in some mild activity or stretching between periods of rest. This will help to prevent stiffness and pain.  Get regular exercise. Ask your health care provider what activities are safe for you.  Do not lift anything that is heavier than 10 lb (4.5 kg). Always use proper lifting technique, which includes: ? Bending your knees. ? Keeping the load close to your body. ? Avoiding twisting.  Sleep on a firm mattress in a comfortable position. Try lying on your side with your knees slightly bent. If you lie on your back, put a pillow under your knees. Managing pain  If directed, apply ice to the painful area. Your health care provider may  recommend applying ice during the first 24-48 hours after a flare-up begins. ? Put ice in a plastic bag. ? Place a towel between your skin and the bag. ? Leave the ice on for 20 minutes, 2-3 times per day.  If directed, apply heat to the affected area as often as told by your health care provider. Use the heat source that your health care provider recommends, such as a moist heat pack or a heating pad. ? Place a towel between your skin and the heat source. ? Leave the heat on for 20-30 minutes. ? Remove the heat if your skin turns bright red. This is especially important if you are unable to feel pain, heat, or cold. You may have a greater risk of getting burned.  Try soaking in a warm tub.  Take over-the-counter and prescription medicines only as told by your health care provider.  Keep all follow-up visits as told by your health care provider. This is important. Contact a health care provider if:  You have pain that is not relieved with rest or medicine. Get help right away if:  You have weakness or numbness in one or both of your legs or feet.  You have trouble controlling your bladder or your bowels.  You have nausea or vomiting.  You have pain in your abdomen.  You have shortness of breath or you faint. This information is not intended to replace advice given to you by your health care provider. Make sure you discuss any questions  you have with your health care provider. Document Revised: 12/22/2018 Document Reviewed: 03/10/2017 Elsevier Patient Education  [place].

## 2020-07-16 NOTE — Progress Notes (Signed)
Subjective:    Patient ID: Catherine Coleman, female    DOB: 05/12/1932, 86 y.o.   MRN: [id number]  HPI  Patient is an 86 yr old female who presents today with chief complaint of lower back pain. Reports that she has a pressure pain across the lower back. She has some right hip pain- (hx of R THR).  She has been trying to walk outside about 1/2 a mile.  Back pain has been getting worse and worse.  Hip only hurts when she doesn't use her trekking poles.  Notes increased clumsiness in her legs.  Reports some numbness in the soles of her feet.  Has sensation but doesn't feel right.   Reports that if she sits for any length of time and then gets up, she loses control of her bladder.    Reports bowels have been looser lately but she sometimes loses control of her bowels. This has been occurring over the last 1 year.      HTN-  BP Readings from Last 3 Encounters:  07/16/20 136/63  05/23/20 (!) 144/97  04/17/20 130/88   Hypothyroid- maintained on synthroid 125 mcg.  Lab Results  Component Value Date   TSH 3.93 12/19/2019   GERD- maintained on omeprazole 20mg  and pepcid 40mg .    Review of Systems See HPI  Past Medical History:  Diagnosis Date  . Arthritis   . Atrophic rhinitis 04/21/2016  . Constipation   . Decreased hearing   . Diverticulitis   . Dry eyes   . Fatigue   . Floaters in visual field   . Gall bladder disease   . GERD (gastroesophageal reflux disease)   . Glaucoma   . Hiatal hernia with gastroesophageal reflux   . History of blood transfusion    for Knee replacement  . Hyperlipidemia, mild 04/02/2016  . Hypertension   . Hypothyroidism   . Joint pain   . Migraine aura without headache   . Muscle pain   . Muscle stiffness   . Nasal congestion   . Obesity 04/02/2016  . Osteoarthritis   . Parotiditis 04/21/2016  . Vitamin D deficiency 04/02/2016     Social History   Socioeconomic History  . Marital status: Widowed    Spouse name: Not on file  . Number of  children: Not on file  . Years of education: Not on file  . Highest education level: Not on file  Occupational History  . Occupation: Retired  Tobacco Use  . Smoking status: Former Research scientist (life sciences)  . Smokeless tobacco: Never Used  . Tobacco comment: Stopped 50 years ago.   Vaping Use  . Vaping Use: Never used  Substance and Sexual Activity  . Alcohol use: Yes    Alcohol/week: 0.0 standard drinks    Comment: socially 3-4x/wk  . Drug use: No  . Sexual activity: Never  Other Topics Concern  . Not on file  Social History Narrative   Lives at [facility], widowed 7 years, no dietary restrictions. Retired from neuropsychiatry work.    Social Determinants of Health   Financial Resource Strain: Low Risk   . Difficulty of Paying Living Expenses: Not hard at all  Food Insecurity:   . Worried About Charity fundraiser in the Last Year: Not on file  . Ran Out of Food in the Last Year: Not on file  Transportation Needs:   . Lack of Transportation (Medical): Not on file  . Lack of Transportation (Non-Medical): Not on file  Physical Activity:   .  Days of Exercise per Week: Not on file  . Minutes of Exercise per Session: Not on file  Stress:   . Feeling of Stress : Not on file  Social Connections:   . Frequency of Communication with Friends and Family: Not on file  . Frequency of Social Gatherings with Friends and Family: Not on file  . Attends Religious Services: Not on file  . Active Member of Clubs or Organizations: Not on file  . Attends Archivist Meetings: Not on file  . Marital Status: Not on file  Intimate Partner Violence:   . Fear of Current or Ex-Partner: Not on file  . Emotionally Abused: Not on file  . Physically Abused: Not on file  . Sexually Abused: Not on file    Past Surgical History:  Procedure Laterality Date  . APPENDECTOMY    . cataract surgery  2008  . CHOLECYSTECTOMY  2010  . JOINT REPLACEMENT Left    2012  . MASTOIDECTOMY    . POSTERIOR CERVICAL  FUSION/FORAMINOTOMY N/A 04/27/2017   Procedure: CERVICAL FIVE-SIX  OPEN REDUCTION OF FRACTURE, CERVICAL THREE-SEVEN  DORSAL FIXATION AND FUSION;  Surgeon: Ditty, Kevan Ny, MD;  Location: [place];  Service: Neurosurgery;  Laterality: N/A;  . SHOULDER ARTHROSCOPY Bilateral prior to 2010  . TONSILLECTOMY    . TOTAL HIP ARTHROPLASTY Right 03/22/2015   Procedure: RIGHT TOTAL HIP ARTHROPLASTY ANTERIOR APPROACH;  Surgeon: Dorna Leitz, MD;  Location: [place];  Service: Orthopedics;  Laterality: Right;  . TOTAL KNEE ARTHROPLASTY Bilateral 2007  . TUBAL LIGATION      Family History  Problem Relation Age of Onset  . Dementia Mother   . Hypertension Mother   . Obesity Mother   . Heart disease Father   . Hypertension Father   . Stroke Father   . Diabetes Father   . Hyperlipidemia Father   . Alcoholism Father   . Obesity Father   . Arthritis Daughter   . Cancer Maternal Grandfather        colon cancer    Allergies  Allergen Reactions  . Other Other (See Comments)    Seasonal allergies(pollen & grass)    Current Outpatient Medications on File Prior to Visit  Medication Sig Dispense Refill  . Biotin 10000 MCG TABS Take by mouth.    . brimonidine (ALPHAGAN) 0.2 % ophthalmic solution Place 1 drop into both eyes 2 (two) times daily. 5 mL 12  . famotidine (PEPCID) 40 MG tablet TAKE 1 TABLET BY MOUTH AT  BEDTIME AS NEEDED 90 tablet 3  . levothyroxine (SYNTHROID) 125 MCG tablet TAKE 1 TABLET BY MOUTH  DAILY 90 tablet 3  . losartan (COZAAR) 50 MG tablet TAKE 1 TABLET BY MOUTH  DAILY 90 tablet 3  . Multiple Vitamins-Minerals (MULTIVITAMIN WITH MINERALS) tablet Take 1 tablet by mouth daily.    Marland Kitchen omeprazole (PRILOSEC) 20 MG capsule TAKE 1 CAPSULE BY MOUTH  DAILY 90 capsule 3  . polyethylene glycol (MIRALAX / GLYCOLAX) packet Take 17 g by mouth daily. 14 each 0   No current facility-administered medications on file prior to visit.    BP 136/63 (BP Location: Right Arm, Patient Position: Sitting,  Cuff Size: Small)   Pulse 72   Temp 97.7 F (36.5 C) (Oral)   Resp 16   Ht 5\' 5"  (1.651 m)   Wt 213 lb (96.6 kg)   SpO2 99%   BMI 35.45 kg/m       Objective:   Physical Exam Constitutional:  Appearance: She is well-developed.  Cardiovascular:     Rate and Rhythm: Normal rate and regular rhythm.     Heart sounds: Normal heart sounds. No murmur heard.   Pulmonary:     Effort: Pulmonary effort is normal. No respiratory distress.     Breath sounds: Normal breath sounds. No wheezing.  Skin:    General: Skin is warm and dry.  Neurological:     Mental Status: She is oriented to person, place, and time.     Comments: Bilateral UE/LE strength is 5/5  Able to feel monofilament bilateral feet  Psychiatric:        Behavior: Behavior normal.        Thought Content: Thought content normal.        Judgment: Judgment normal.           Assessment & Plan:  Low back pain- I am concerned about her worsening urinary incontinence and some bowel incontinence.  She does note that her stools have been more runny lately. She has perception of decreased sensation in her feet, unsteady gait.  Will obtain MRI of the lumbar spine to further evaluate.  This visit occurred during the SARS-CoV-2 public health emergency.  Safety protocols were in place, including screening questions prior to the visit, additional usage of staff PPE, and extensive cleaning of exam room while observing appropriate contact time as indicated for disinfecting solutions.

## 2020-07-20 ENCOUNTER — Ambulatory Visit (HOSPITAL_BASED_OUTPATIENT_CLINIC_OR_DEPARTMENT_OTHER)
Admission: RE | Admit: 2020-07-20 | Discharge: 2020-07-20 | Disposition: A | Discharge status: Home / Self Care | Payer: Medicare Other | Source: Ambulatory Visit | Attending: Family | Admitting: Family

## 2020-07-20 ENCOUNTER — Other Ambulatory Visit: Payer: Self-pay

## 2020-07-20 DIAGNOSIS — M5416 Radiculopathy, lumbar region: Secondary | ICD-10-CM | POA: Diagnosis not present

## 2020-07-20 DIAGNOSIS — M545 Low back pain, unspecified: Secondary | ICD-10-CM | POA: Diagnosis not present

## 2020-07-22 ENCOUNTER — Telehealth: Payer: Self-pay | Admitting: Family

## 2020-07-22 DIAGNOSIS — M549 Dorsalgia, unspecified: Secondary | ICD-10-CM

## 2020-07-22 NOTE — Telephone Encounter (Signed)
Please advise pt that her mri shows that arthritis and possibly some pinched nerves in her spine. I will refer her to neurosurgery to see what they can offer her.  At her age, I doubt that they would recommend surgery, but she may be a candidate for epidural steroid injections.

## 2020-07-22 NOTE — Telephone Encounter (Signed)
Lvm for patient to call back about MRI results

## 2020-07-24 ENCOUNTER — Ambulatory Visit: Payer: Medicare Other | Admitting: Family

## 2020-07-24 NOTE — Telephone Encounter (Signed)
Patient advised of results and referral. She verbalized understanding.  

## 2020-07-29 ENCOUNTER — Telehealth: Payer: Self-pay | Admitting: Pharmacist

## 2020-07-29 ENCOUNTER — Ambulatory Visit (HOSPITAL_BASED_OUTPATIENT_CLINIC_OR_DEPARTMENT_OTHER): Payer: Medicare Other

## 2020-07-29 NOTE — Progress Notes (Addendum)
    Chronic Care Management Pharmacy Assistant   Name: Catherine Coleman  MRN: [id number] DOB: March 03, 1932  Reason for Encounter: Medication Review/ General Adherence  Patient Questions:  1.  Have you seen any other providers since your last visit? No  2.  Any changes in your medicines or health? No  PCP : [name], MD   Their chronic conditions include: Hypertension, Hyperlipidemia, Pre-Diabetes, Hypothyroidism, GERD, Osteopenia, Glaucoma  Office visit 07-16-20(PCP)  Allergies:   Allergies  Allergen Reactions   Other Other (See Comments)    Seasonal allergies(pollen & grass)    Medications: Outpatient Encounter Medications as of 07/29/2020  Medication Sig   Biotin 10000 MCG TABS Take by mouth.   brimonidine (ALPHAGAN) 0.2 % ophthalmic solution Place 1 drop into both eyes 2 (two) times daily.   famotidine (PEPCID) 40 MG tablet TAKE 1 TABLET BY MOUTH AT  BEDTIME AS NEEDED   levothyroxine (SYNTHROID) 125 MCG tablet TAKE 1 TABLET BY MOUTH  DAILY   losartan (COZAAR) 50 MG tablet TAKE 1 TABLET BY MOUTH  DAILY   Multiple Vitamins-Minerals (MULTIVITAMIN WITH MINERALS) tablet Take 1 tablet by mouth daily.   omeprazole (PRILOSEC) 20 MG capsule TAKE 1 CAPSULE BY MOUTH  DAILY   polyethylene glycol (MIRALAX / GLYCOLAX) packet Take 17 g by mouth daily.   No facility-administered encounter medications on file as of 07/29/2020.    Current Diagnosis: Patient Active Problem List   Diagnosis Date Noted   UTI due to Klebsiella species 10/08/2019   Sun-damaged skin 06/02/2019   Estrogen deficiency 11/13/2018   . 08/25/2018   Prediabetes 06/14/2018   Low back pain 06/12/2018   Hyperglycemia 01/28/2018   Recurrent sinusitis 10/24/2017   Urinary incontinence 10/18/2017   Physical deconditioning 10/18/2017   History of cervical fracture 06/09/2017   Hypoxia 05/14/2017   Sternal fracture 04/30/2017   Parotiditis 04/21/2016   Breast cancer screening 04/21/2016   Atrophic rhinitis  04/21/2016   Obesity 04/02/2016   Diverticulosis of colon without hemorrhage 04/02/2016   Vitamin D deficiency 04/02/2016   Hyperlipidemia, mild 04/02/2016   Hypertension    Arthritis    Hiatal hernia with gastroesophageal reflux    Glaucoma    Hypothyroidism    Primary osteoarthritis of right hip 03/22/2015    Goals Addressed   None    Omeprazole tapering schedule was not received and patient reports she has started not tapering off medication.  Reports not understanding why she needs to stop her omeprazole.   Follow-Up:  Pharmacist Review   [name], [facility] Pharmacist Assistant [phone]  Will re-address at next CCM Visit.   Reviewed by: De Blanch, PharmD Clinical Pharmacist [facility] [phone]

## 2020-08-06 NOTE — Telephone Encounter (Signed)
Patient called back in reference to a medication (omeprazole (PRILOSEC) 20 MG capsule [[id number]] ). Patient states she would like a call back from [place].

## 2020-08-15 ENCOUNTER — Other Ambulatory Visit: Payer: Self-pay | Admitting: Family Medicine

## 2020-08-15 DIAGNOSIS — Z1231 Encounter for screening mammogram for malignant neoplasm of breast: Secondary | ICD-10-CM

## 2020-08-22 DIAGNOSIS — M7918 Myalgia, other site: Secondary | ICD-10-CM | POA: Diagnosis not present

## 2020-08-22 DIAGNOSIS — M47816 Spondylosis without myelopathy or radiculopathy, lumbar region: Secondary | ICD-10-CM | POA: Diagnosis not present

## 2020-08-23 DIAGNOSIS — H43813 Vitreous degeneration, bilateral: Secondary | ICD-10-CM | POA: Diagnosis not present

## 2020-08-23 DIAGNOSIS — H04123 Dry eye syndrome of bilateral lacrimal glands: Secondary | ICD-10-CM | POA: Diagnosis not present

## 2020-08-23 DIAGNOSIS — H401421 Capsular glaucoma with pseudoexfoliation of lens, left eye, mild stage: Secondary | ICD-10-CM | POA: Diagnosis not present

## 2020-08-23 DIAGNOSIS — H401412 Capsular glaucoma with pseudoexfoliation of lens, right eye, moderate stage: Secondary | ICD-10-CM | POA: Diagnosis not present

## 2020-08-23 DIAGNOSIS — H47093 Other disorders of optic nerve, not elsewhere classified, bilateral: Secondary | ICD-10-CM | POA: Diagnosis not present

## 2020-09-05 ENCOUNTER — Ambulatory Visit (INDEPENDENT_AMBULATORY_CARE_PROVIDER_SITE_OTHER): Payer: Medicare Other | Admitting: Family Medicine

## 2020-09-05 ENCOUNTER — Ambulatory Visit: Payer: Medicare Other | Admitting: Family Medicine

## 2020-09-05 ENCOUNTER — Other Ambulatory Visit: Payer: Self-pay

## 2020-09-05 VITALS — BP 114/72 | HR 87 | Temp 96.9°F | Resp 18 | Wt 216.0 lb

## 2020-09-05 DIAGNOSIS — K219 Gastro-esophageal reflux disease without esophagitis: Secondary | ICD-10-CM | POA: Diagnosis not present

## 2020-09-05 DIAGNOSIS — E559 Vitamin D deficiency, unspecified: Secondary | ICD-10-CM

## 2020-09-05 DIAGNOSIS — E785 Hyperlipidemia, unspecified: Secondary | ICD-10-CM

## 2020-09-05 DIAGNOSIS — E038 Other specified hypothyroidism: Secondary | ICD-10-CM

## 2020-09-05 DIAGNOSIS — I10 Essential (primary) hypertension: Secondary | ICD-10-CM

## 2020-09-05 DIAGNOSIS — Z9189 Other specified personal risk factors, not elsewhere classified: Secondary | ICD-10-CM | POA: Diagnosis not present

## 2020-09-05 DIAGNOSIS — R739 Hyperglycemia, unspecified: Secondary | ICD-10-CM

## 2020-09-05 DIAGNOSIS — M545 Low back pain, unspecified: Secondary | ICD-10-CM

## 2020-09-05 DIAGNOSIS — E669 Obesity, unspecified: Secondary | ICD-10-CM

## 2020-09-05 DIAGNOSIS — K449 Diaphragmatic hernia without obstruction or gangrene: Secondary | ICD-10-CM

## 2020-09-05 NOTE — Assessment & Plan Note (Signed)
Encouraged heart healthy diet, increase exercise, avoid trans fats, consider a krill oil cap daily 

## 2020-09-05 NOTE — Patient Instructions (Signed)

## 2020-09-05 NOTE — Assessment & Plan Note (Addendum)
Encouraged moist heat and gentle stretching as tolerated. May try NSAIDs and prescription meds as directed and report if symptoms worsen or seek immediate care. Following with Dr Maia Petties of ortho and doing PT has helped but her pain flared with picking up a case of water.

## 2020-09-05 NOTE — Assessment & Plan Note (Signed)
Well controlled, no changes to meds. Encouraged heart healthy diet such as the DASH diet and exercise as tolerated.  °

## 2020-09-05 NOTE — Assessment & Plan Note (Signed)
Supplement and monitor 

## 2020-09-05 NOTE — Assessment & Plan Note (Signed)
hgba1c acceptable, minimize simple carbs. Increase exercise as tolerated.  

## 2020-09-05 NOTE — Assessment & Plan Note (Signed)
On Levothyroxine, continue to monitor 

## 2020-09-10 ENCOUNTER — Ambulatory Visit: Payer: Medicare Other | Admitting: Pharmacist

## 2020-09-10 DIAGNOSIS — E785 Hyperlipidemia, unspecified: Secondary | ICD-10-CM

## 2020-09-10 DIAGNOSIS — R7303 Prediabetes: Secondary | ICD-10-CM

## 2020-09-10 DIAGNOSIS — I10 Essential (primary) hypertension: Secondary | ICD-10-CM

## 2020-09-10 NOTE — Patient Instructions (Addendum)
Visit Information  Goals Addressed            This Visit's Progress   . Chronic Care Management Pharmacy Care Plan       CARE PLAN ENTRY (see longitudinal plan of care for additional care plan information)  Current Barriers:  . Chronic Disease Management support, education, and care coordination needs related to Hypertension, Hyperlipidemia, Pre-Diabetes, Hypothyroidism, GERD, Osteopenia, Glaucoma   Hypertension BP Readings from Last 3 Encounters:  09/05/20 114/72  07/16/20 136/63  05/23/20 (!) 144/97   . Pharmacist Clinical Goal(s): o Over the next 90 days, patient will work with PharmD and providers to maintain BP goal <140/90 . Current regimen:  o Losartan 50mg  daily . Interventions: o Requested patient to check blood pressure 1-2 times per week and record . Patient self care activities - Over the next 90 days, patient will: o Check BP 1-2 times per wek, document, and provide at future appointments o Ensure daily salt intake < 2300 mg/Catherine Coleman  Hyperlipidemia Lab Results  Component Value Date/Time   LDLCALC 120 (H) 10/03/2019 11:56 AM   LDLCALC 134 (H) 09/28/2018 11:47 AM   LDLDIRECT 138.0 05/15/2019 10:22 AM   . Pharmacist Clinical Goal(s): o Over the next 180 days, patient will work with PharmD and providers to achieve LDL goal < 100 or not have further increase in LDL . Current regimen:  o Diet and exercise management   . Interventions: o Discussed Lipid goals . Patient self care activities - Over the next 180 days, patient will: o Work to improve diet to help reduce LDL  Pre-Diabetes Lab Results  Component Value Date/Time   HGBA1C 6.3 05/15/2019 10:22 AM   HGBA1C 6.1 (H) 09/28/2018 11:47 AM   . Pharmacist Clinical Goal(s): o Over the next 90 days, patient will work with PharmD and providers to maintain A1c goal <6.5% . Current regimen:  o Diet and exercise management   . Interventions: o Discussed diet and exercise o Discussed A1c goal . Patient self care  activities - Over the next 90 days, patient will: o Maintain a1c <6.5%  GERD . Pharmacist Clinical Goal(s) o Over the next 90 days, patient will work with PharmD and providers to reduce symptoms of GERD and polypharmacy . Current regimen:  . Famotidine 40mg  daily at bedtime  . Omeprazole 20mg  daily . Interventions: o Discussed the risk/benefit of continuation vs discontinuation of long term PPI use  o Discussed possible tapering plan for omeprazole . Patient self care activities - Over the next 90 days, patient will: o Consider implementing omeprazole tapering plan  Osteopenia . Pharmacist Clinical Goal(s) o Over the next 90 days, patient will work with PharmD and providers to reduce risk of fracture due to osteopenia . Current regimen:  o Vitamin D 1000 units daily . Interventions: o Discussed intake of 1200mg  of calcium daily through diet and/or supplementation o Discussed intake of [phone] units of vitamin D through supplementation o Recommended patient use calcium citrate vs calcium carbonate . Patient self care activities - Over the next 90 days, patient will: o Consider purchasing calcium citrate o Ensure she is receiving 1200mg  of calcium daily   Medication management . Pharmacist Clinical Goal(s): o Over the next 90 days, patient will work with PharmD and providers to maintain optimal medication adherence . Current pharmacy: 09/30/2018 . Interventions o Comprehensive medication review performed. o Continue current medication management strategy . Patient self care activities - Over the next 90 days, patient will: o  Focus on medication adherence by filling and taking medications appropriately  o Take medications as prescribed o Report any questions or concerns to PharmD and/or provider(s)  Please see past updates related to this goal by clicking on the "Past Updates" button in the selected goal         The patient verbalized understanding of  instructions, educational materials, and care plan provided today and agreed to receive a mailed copy of patient instructions, educational materials, and care plan.   Telephone follow up appointment with pharmacy team member scheduled for: 03/11/2021  Dannielle Karvonen Kacin Dancy, [facility]

## 2020-09-10 NOTE — Chronic Care Management (AMB) (Signed)
Chronic Care Management Pharmacy  Name: Neela Berkland  MRN: [id number] DOB: 1931-12-23  Chief Complaint/ HPI  Catherine Coleman,  86 y.o. , female presents for their Follow-Up CCM visit with the clinical pharmacist via telephone due to COVID-19 Pandemic.  PCP : Bradd Canary, MD  Their chronic conditions include: Hypertension, Hyperlipidemia, Pre-Diabetes, Hypothyroidism, GERD, Osteopenia, Glaucoma  Office Visits: 09/05/20: Visit w/ Dr. Abner Greenspan -  Low back pain. Encouraged moist heat and gentle stretching as tolerated. May try NSAIDs and prescription meds as directed. RTC 4 months.   07/22/20: Provider message that pt's MRI shows arthritis and possibly pinched nerves. Refer to neurosurgery.  07/16/20: Visit w/ Sandford Craze, NP - Low back pain with urinary and bowel incontinence. MRI scheduled.   Consult Visit: 08/23/20: Ophthalmology visit w/ Dr. Logan Bores - Continue brimonidine one drop twice daily and preservative free artificial tears 3-4 times per [facility]  Medications: Outpatient Encounter Medications as of 09/10/2020  Medication Sig  . Biotin 42353 MCG TABS Take by mouth.  . brimonidine (ALPHAGAN) 0.2 % ophthalmic solution Place 1 drop into both eyes 2 (two) times daily.  . famotidine (PEPCID) 40 MG tablet TAKE 1 TABLET BY MOUTH AT  BEDTIME AS NEEDED  . levothyroxine (SYNTHROID) 125 MCG tablet TAKE 1 TABLET BY MOUTH  DAILY  . losartan (COZAAR) 50 MG tablet TAKE 1 TABLET BY MOUTH  DAILY  . Multiple Vitamins-Minerals (MULTIVITAMIN WITH MINERALS) tablet Take 1 tablet by mouth daily.  Marland Kitchen omeprazole (PRILOSEC) 20 MG capsule TAKE 1 CAPSULE BY MOUTH  DAILY  . polyethylene glycol (MIRALAX / GLYCOLAX) packet Take 17 g by mouth daily.   No facility-administered encounter medications on file as of 09/10/2020.   SDOH Screenings   Alcohol Screen: Not on file  Depression (PHQ2-9): Low Risk   . PHQ-2 Score: 0  Financial Resource Strain: Low Risk   . Difficulty of Paying Living Expenses:  Not hard at all  Food Insecurity: Not on file  Housing: Not on file  Physical Activity: Not on file  Social Connections: Not on file  Stress: Not on file  Tobacco Use: Medium Risk  . Smoking Tobacco Use: Former Smoker  . Smokeless Tobacco Use: Never Used  Transportation Needs: Not on file     Current Diagnosis/Assessment:  Goals Addressed            This Visit's Progress   . Chronic Care Management Pharmacy Care Plan       CARE PLAN ENTRY (see longitudinal plan of care for additional care plan information)  Current Barriers:  . Chronic Disease Management support, education, and care coordination needs related to Hypertension, Hyperlipidemia, Pre-Diabetes, Hypothyroidism, GERD, Osteopenia, Glaucoma   Hypertension BP Readings from Last 3 Encounters:  09/05/20 114/72  07/16/20 136/63  05/23/20 (!) 144/97   . Pharmacist Clinical Goal(s): o Over the next 90 days, patient will work with PharmD and providers to maintain BP goal <140/90 . Current regimen:  o Losartan 50mg  daily . Interventions: o Requested patient to check blood pressure 1-2 times per week and record . Patient self care activities - Over the next 90 days, patient will: o Check BP 1-2 times per wek, document, and provide at future appointments o Ensure daily salt intake < 2300 mg/Jalysa Swopes  Hyperlipidemia Lab Results  Component Value Date/Time   LDLCALC 120 (H) 10/03/2019 11:56 AM   LDLCALC 134 (H) 09/28/2018 11:47 AM   LDLDIRECT 138.0 05/15/2019 10:22 AM   . Pharmacist Clinical Goal(s): o Over the next 180  days, patient will work with PharmD and providers to achieve LDL goal < 100 or not have further increase in LDL . Current regimen:  o Diet and exercise management   . Interventions: o Discussed Lipid goals . Patient self care activities - Over the next 180 days, patient will: o Work to improve diet to help reduce LDL  Pre-Diabetes Lab Results  Component Value Date/Time   HGBA1C 6.3 05/15/2019 10:22  AM   HGBA1C 6.1 (H) 09/28/2018 11:47 AM   . Pharmacist Clinical Goal(s): o Over the next 90 days, patient will work with PharmD and providers to maintain A1c goal <6.5% . Current regimen:  o Diet and exercise management   . Interventions: o Discussed diet and exercise o Discussed A1c goal . Patient self care activities - Over the next 90 days, patient will: o Maintain a1c <6.5%  GERD . Pharmacist Clinical Goal(s) o Over the next 90 days, patient will work with PharmD and providers to reduce symptoms of GERD and polypharmacy . Current regimen:  . Famotidine 40mg  daily at bedtime  . Omeprazole 20mg  daily . Interventions: o Discussed the risk/benefit of continuation vs discontinuation of long term PPI use  o Discussed possible tapering plan for omeprazole . Patient self care activities - Over the next 90 days, patient will: o Consider implementing omeprazole tapering plan  Osteopenia . Pharmacist Clinical Goal(s) o Over the next 90 days, patient will work with PharmD and providers to reduce risk of fracture due to osteopenia . Current regimen:  o Vitamin D 1000 units daily . Interventions: o Discussed intake of 1200mg  of calcium daily through diet and/or supplementation o Discussed intake of [phone] units of vitamin D through supplementation o Recommended patient use calcium citrate vs calcium carbonate . Patient self care activities - Over the next 90 days, patient will: o Consider purchasing calcium citrate o Ensure she is receiving 1200mg  of calcium daily   Medication management . Pharmacist Clinical Goal(s): o Over the next 90 days, patient will work with PharmD and providers to maintain optimal medication adherence . Current pharmacy: . Interventions o Comprehensive medication review performed. o Continue current medication management strategy . Patient self care activities - Over the next 90 days, patient will: o Focus on medication adherence by  filling and taking medications appropriately  o Take medications as prescribed o Report any questions or concerns to PharmD and/or provider(s)  Please see past updates related to this goal by clicking on the "Past Updates" button in the selected goal        Social Hx:  Lives at Has 4 daughters ([place], , Publishing copy) Has 5 grandchildren Widowed in 2010 due to husband having prostate cancer.  Hobbies: spinning, weaving, she is performing a beading show tomorrow at [place]  She is now back into her social activities at [place]  Hypertension   BP goal is:  <140/90  Office blood pressures are  BP Readings from Last 3 Encounters:  09/05/20 114/72  07/16/20 136/63  05/23/20 (!) 144/97   Patient checks BP at home infrequently (has a wrist and arm cuff at home, but does not use. "I don't see the need") Patient home BP readings are ranging: Unable to assess  Patient has failed these meds in the past: None noted  Patient is currently controlled on the following medications:  . Losartan 50mg  daily  We discussed BP goal and request for patient to check BP 1-2 times per week and  record to assure her BP is not too low or too high   Update 09/10/20 No specific home readings to provide.  Last BP in clinic at goal.  Plan -Continue current medications  -Consider checking BP 1-2 times per week and recording     Pre-Diabetes   A1c goal <6.5%  Recent Relevant Labs: Lab Results  Component Value Date/Time   HGBA1C 6.3 05/15/2019 10:22 AM   HGBA1C 6.1 (H) 09/28/2018 11:47 AM   GFR 82.11 10/03/2019 11:56 AM   GFR 85.07 05/15/2019 10:22 AM     Patient has failed these meds in past: None noted  Patient is currently controlled, but trending up on the following medications: . None  We discussed: a1c goals, diet and exercise as noted above  Update 09/10/20 Could consider repeat a1c at next visit  Plan -Continue control with diet and  exercise   GERD   Patient has failed these meds in past: None noted  Patient is currently controlled on the following medications:  . Famotidine 40mg  daily at bedtime (uses PRN) . Omeprazole 20mg  daily  Breakthrough Sx: Minimal, sometimes in middle of night about 3 times per week Breakthrough Tx: Famotidine  We discussed:  the risk/benefit of continuation vs discontinuation of long term PPI use.   Note that patient does have diagnosis of hiatal hernia with GERD, but goal will be to control with famotidine or no medication if possible  Patient is interested in tapering omeprazole.  She states she has tried to stop taking it in the past, but develops symptoms after 2 days of no omeprazole use.  We discussed the tapering plan described below. Will provide a calendar for patient to start tapering plan noting she can supplement with famotidine if she develops symptoms. If she does not tolerate the tapering plan she can resume omeprazole therapy.  Update 09/10/20 Remember tapering discussion at last CCM visit? States she never received a report for the tapering plan. Can consider discussion in the future.   Plan -Continue current medications   Future Plan Omeprazole Tapering Plan Week 1: Take every other Sarayah Bacchi Week 2: Take every 3rd Twanisha Foulk (2 days between doses) Week 3: Take every 4th June Vacha/Twice a week (3 days between doses) Week: 4: Take once a week Week 5: Discontinue    Osteopenia   Last DEXA Scan: 07/17/2019  T-Score forearm radius: -1.7   VITD  Date Value Ref Range Status  10/03/2019 39.98 30.00 - 100.00 ng/mL Final     Patient is not a candidate for pharmacologic treatment  Patient has failed these meds in past: None noted  Patient is currently controlled on the following medications:  Marland Kitchen Vitamin D 1000 units daily  We discussed:  Recommend [phone] units of vitamin D daily. Recommend 1200 mg of calcium daily from dietary and supplemental sources.   She notes that  calcium carbonate upsets her stomach. Recommended patient try calcium citrate especially since she is still currently using PPI.  Update 09/10/20 Did you switch to calcium citrate? No, she forgot about this change  Plan -Consider intake of 1200mg  of calcium daily through diet and/or supplementation  Medication Management   Pt uses Mail Order pharmacy for all medications Uses pill box? No - feels like she has a system Pt endorses 100% compliance  We discussed benefits of using a pill box, but pt would like to continue her current strategy Patient states she does struggle with opening her medication caps. Encouraged patient to request easy open caps from the  mail order pharmacy.  Plan -Continue current medication management strategy    Follow up:  6 month phone visit  Miscellaneous Meds  Biotin 107mcg  Multivitamin Miralax Probiotic (discussed that she can D/C this if not taking an abx or having any GI issues)  De Blanch, PharmD, BCACP Clinical Pharmacist [facility] [phone]

## 2020-09-10 NOTE — Progress Notes (Signed)
Subjective:    Patient ID: Catherine Coleman, female    DOB: Aug 16, 1932, 86 y.o.   MRN: [id number]  Chief Complaint  Patient presents with  . Gastroesophageal Reflux  . Follow-up    HPI Patient is in today for follow up on chronic medical concerns. No recent febrile illness or hospitalizations. She continues to struggle with low back pain but her stool incontinence has improved with physical therapy. No falls or trauma. Denies CP/palp/SOB/HA/congestion/fevers/GI or GU c/o. Taking meds as prescribed  Past Medical History:  Diagnosis Date  . Arthritis   . Atrophic rhinitis 04/21/2016  . Constipation   . Decreased hearing   . Diverticulitis   . Dry eyes   . Fatigue   . Floaters in visual field   . Gall bladder disease   . GERD (gastroesophageal reflux disease)   . Glaucoma   . Hiatal hernia with gastroesophageal reflux   . History of blood transfusion    for Knee replacement  . Hyperlipidemia, mild 04/02/2016  . Hypertension   . Hypothyroidism   . Joint pain   . Migraine aura without headache   . Muscle pain   . Muscle stiffness   . Nasal congestion   . Obesity 04/02/2016  . Osteoarthritis   . Parotiditis 04/21/2016  . Vitamin D deficiency 04/02/2016    Past Surgical History:  Procedure Laterality Date  . APPENDECTOMY    . cataract surgery  2008  . CHOLECYSTECTOMY  2010  . JOINT REPLACEMENT Left    2012  . MASTOIDECTOMY    . POSTERIOR CERVICAL FUSION/FORAMINOTOMY N/A 04/27/2017   Procedure: CERVICAL FIVE-SIX  OPEN REDUCTION OF FRACTURE, CERVICAL THREE-SEVEN  DORSAL FIXATION AND FUSION;  Surgeon: Ditty, Kevan Ny, MD;  Location: [place];  Service: Neurosurgery;  Laterality: N/A;  . SHOULDER ARTHROSCOPY Bilateral prior to 2010  . TONSILLECTOMY    . TOTAL HIP ARTHROPLASTY Right 03/22/2015   Procedure: RIGHT TOTAL HIP ARTHROPLASTY ANTERIOR APPROACH;  Surgeon: Dorna Leitz, MD;  Location: [place];  Service: Orthopedics;  Laterality: Right;  . TOTAL KNEE ARTHROPLASTY Bilateral  2007  . TUBAL LIGATION      Family History  Problem Relation Age of Onset  . Dementia Mother   . Hypertension Mother   . Obesity Mother   . Heart disease Father   . Hypertension Father   . Stroke Father   . Diabetes Father   . Hyperlipidemia Father   . Alcoholism Father   . Obesity Father   . Arthritis Daughter   . Cancer Maternal Grandfather        colon cancer    Social History   Socioeconomic History  . Marital status: Widowed    Spouse name: Not on file  . Number of children: Not on file  . Years of education: Not on file  . Highest education level: Not on file  Occupational History  . Occupation: Retired  Tobacco Use  . Smoking status: Former Research scientist (life sciences)  . Smokeless tobacco: Never Used  . Tobacco comment: Stopped 50 years ago.   Vaping Use  . Vaping Use: Never used  Substance and Sexual Activity  . Alcohol use: Yes    Alcohol/week: 0.0 standard drinks    Comment: socially 3-4x/wk  . Drug use: No  . Sexual activity: Never  Other Topics Concern  . Not on file  Social History Narrative   Lives at [facility], widowed 7 years, no dietary restrictions. Retired from neuropsychiatry work.    Social Determinants of Health  Financial Resource Strain: Low Risk   . Difficulty of Paying Living Expenses: Not hard at all  Food Insecurity: Not on file  Transportation Needs: Not on file  Physical Activity: Not on file  Stress: Not on file  Social Connections: Not on file  Intimate Partner Violence: Not on file    Outpatient Medications Prior to Visit  Medication Sig Dispense Refill  . Biotin 62376 MCG TABS Take by mouth.    . brimonidine (ALPHAGAN) 0.2 % ophthalmic solution Place 1 drop into both eyes 2 (two) times daily. 5 mL 12  . famotidine (PEPCID) 40 MG tablet TAKE 1 TABLET BY MOUTH AT  BEDTIME AS NEEDED 90 tablet 3  . levothyroxine (SYNTHROID) 125 MCG tablet TAKE 1 TABLET BY MOUTH  DAILY 90 tablet 3  . losartan (COZAAR) 50 MG tablet TAKE 1 TABLET BY MOUTH   DAILY 90 tablet 3  . Multiple Vitamins-Minerals (MULTIVITAMIN WITH MINERALS) tablet Take 1 tablet by mouth daily.    Marland Kitchen omeprazole (PRILOSEC) 20 MG capsule TAKE 1 CAPSULE BY MOUTH  DAILY 90 capsule 3  . polyethylene glycol (MIRALAX / GLYCOLAX) packet Take 17 g by mouth daily. 14 each 0   No facility-administered medications prior to visit.    Allergies  Allergen Reactions  . Other Other (See Comments)    Seasonal allergies(pollen & grass)    Review of Systems  Constitutional: Negative for fever and malaise/fatigue.  HENT: Negative for congestion.   Eyes: Negative for blurred vision.  Respiratory: Negative for shortness of breath.   Cardiovascular: Negative for chest pain, palpitations and leg swelling.  Gastrointestinal: Negative for abdominal pain, blood in stool and nausea.  Genitourinary: Negative for dysuria and frequency.  Musculoskeletal: Positive for back pain. Negative for falls.  Skin: Negative for rash.  Neurological: Negative for dizziness, loss of consciousness and headaches.  Endo/Heme/Allergies: Negative for environmental allergies.  Psychiatric/Behavioral: Negative for depression. The patient is not nervous/anxious.        Objective:    Physical Exam Vitals and nursing note reviewed.  Constitutional:      General: She is not in acute distress.    Appearance: She is well-developed and well-nourished.  HENT:     Head: Normocephalic and atraumatic.     Nose: Nose normal.  Eyes:     General:        Right eye: No discharge.        Left eye: No discharge.  Cardiovascular:     Rate and Rhythm: Normal rate and regular rhythm.     Heart sounds: No murmur heard.   Pulmonary:     Effort: Pulmonary effort is normal.     Breath sounds: Normal breath sounds.  Abdominal:     General: Bowel sounds are normal.     Palpations: Abdomen is soft.     Tenderness: There is no abdominal tenderness.  Musculoskeletal:        General: No edema.     Cervical back: Normal  range of motion and neck supple.  Skin:    General: Skin is warm and dry.  Neurological:     Mental Status: She is alert and oriented to person, place, and time.  Psychiatric:        Mood and Affect: Mood and affect normal.     BP 114/72   Pulse 87   Temp (!) 96.9 F (36.1 C) (Temporal)   Resp 18   Wt 216 lb (98 kg)   SpO2 93%   BMI  35.94 kg/m  Wt Readings from Last 3 Encounters:  09/05/20 216 lb (98 kg)  07/16/20 213 lb (96.6 kg)  05/23/20 217 lb 3.2 oz (98.5 kg)    Diabetic Foot Exam - Simple   No data filed    Lab Results  Component Value Date   WBC 5.2 10/03/2019   HGB 13.4 10/03/2019   HCT 40.2 10/03/2019   PLT 223.0 10/03/2019   GLUCOSE 112 (H) 10/03/2019   CHOL 206 (H) 10/03/2019   TRIG 158.0 (H) 10/03/2019   HDL 54.70 10/03/2019   LDLDIRECT 138.0 05/15/2019   LDLCALC 120 (H) 10/03/2019   ALT 16 10/03/2019   AST 18 10/03/2019   NA 135 10/03/2019   K 5.0 10/03/2019   CL 99 10/03/2019   CREATININE 0.68 10/03/2019   BUN 12 10/03/2019   CO2 26 10/03/2019   TSH 3.93 12/19/2019   INR 1.05 04/28/2017   HGBA1C 6.3 05/15/2019    Lab Results  Component Value Date   TSH 3.93 12/19/2019   Lab Results  Component Value Date   WBC 5.2 10/03/2019   HGB 13.4 10/03/2019   HCT 40.2 10/03/2019   MCV 96.1 10/03/2019   PLT 223.0 10/03/2019   Lab Results  Component Value Date   NA 135 10/03/2019   K 5.0 10/03/2019   CO2 26 10/03/2019   GLUCOSE 112 (H) 10/03/2019   BUN 12 10/03/2019   CREATININE 0.68 10/03/2019   BILITOT 0.3 10/03/2019   ALKPHOS 95 10/03/2019   AST 18 10/03/2019   ALT 16 10/03/2019   PROT 6.7 10/03/2019   ALBUMIN 4.4 10/03/2019   CALCIUM 9.1 10/03/2019   ANIONGAP 8 05/15/2017   GFR 82.11 10/03/2019   Lab Results  Component Value Date   CHOL 206 (H) 10/03/2019   Lab Results  Component Value Date   HDL 54.70 10/03/2019   Lab Results  Component Value Date   LDLCALC 120 (H) 10/03/2019   Lab Results  Component Value Date    TRIG 158.0 (H) 10/03/2019   Lab Results  Component Value Date   CHOLHDL 4 10/03/2019   Lab Results  Component Value Date   HGBA1C 6.3 05/15/2019       Assessment & Plan:   Problem List Items Addressed This Visit    Hypertension    Well controlled, no changes to meds. Encouraged heart healthy diet such as the DASH diet and exercise as tolerated.       Relevant Orders   CBC   Comprehensive metabolic panel   TSH   Hiatal hernia with gastroesophageal reflux    Avoid offending foods, start probiotics. Do not eat large meals in late evening and consider raising head of bed. She notes some bowel incontinence she was struggling with improved with physical therapy      Hypothyroidism    On Levothyroxine, continue to monitor      Obesity    Encouraged DASH diet, decrease po intake and increase exercise as tolerated. Needs 7-8 hours of sleep nightly. Avoid trans fats, eat small, frequent meals every 4-5 hours with lean proteins, complex carbs and healthy fats. Minimize simple carbs      Vitamin D deficiency    Supplement and monitor      Relevant Orders   VITAMIN D 25 Hydroxy (Vit-D Deficiency, Fractures)   Hyperlipidemia, mild    Encouraged heart healthy diet, increase exercise, avoid trans fats, consider a krill oil cap daily      Relevant Orders   Lipid  panel   Hyperglycemia    hgba1c acceptable, minimize simple carbs. Increase exercise as tolerated.       Relevant Orders   Hemoglobin A1c   Low back pain    Encouraged moist heat and gentle stretching as tolerated. May try NSAIDs and prescription meds as directed and report if symptoms worsen or seek immediate care. Following with Dr Retia Passe of ortho and doing PT has helped but her pain flared with picking up a case of water.        Other Visit Diagnoses    At high risk for breast cancer    -  Primary   Relevant Orders   MM 3D SCREEN BREAST BILATERAL      I am having [name] maintain her multivitamin with  minerals, brimonidine, polyethylene glycol, Biotin, famotidine, omeprazole, levothyroxine, and losartan.  No orders of the defined types were placed in this encounter.    Danise Edge, MD

## 2020-09-10 NOTE — Assessment & Plan Note (Signed)
Avoid offending foods, start probiotics. Do not eat large meals in late evening and consider raising head of bed. She notes some bowel incontinence she was struggling with improved with physical therapy

## 2020-09-10 NOTE — Assessment & Plan Note (Signed)
Encouraged DASH diet, decrease po intake and increase exercise as tolerated. Needs 7-8 hours of sleep nightly. Avoid trans fats, eat small, frequent meals every 4-5 hours with lean proteins, complex carbs and healthy fats. Minimize simple carbs 

## 2020-09-12 DIAGNOSIS — M5459 Other low back pain: Secondary | ICD-10-CM | POA: Diagnosis not present

## 2020-09-12 DIAGNOSIS — M47816 Spondylosis without myelopathy or radiculopathy, lumbar region: Secondary | ICD-10-CM | POA: Diagnosis not present

## 2020-09-12 DIAGNOSIS — M6281 Muscle weakness (generalized): Secondary | ICD-10-CM | POA: Diagnosis not present

## 2020-09-23 DIAGNOSIS — M6281 Muscle weakness (generalized): Secondary | ICD-10-CM | POA: Diagnosis not present

## 2020-09-23 DIAGNOSIS — N390 Urinary tract infection, site not specified: Secondary | ICD-10-CM | POA: Diagnosis not present

## 2020-09-23 DIAGNOSIS — M47816 Spondylosis without myelopathy or radiculopathy, lumbar region: Secondary | ICD-10-CM | POA: Diagnosis not present

## 2020-09-23 DIAGNOSIS — M5459 Other low back pain: Secondary | ICD-10-CM | POA: Diagnosis not present

## 2020-09-26 DIAGNOSIS — M6281 Muscle weakness (generalized): Secondary | ICD-10-CM | POA: Diagnosis not present

## 2020-09-26 DIAGNOSIS — M47816 Spondylosis without myelopathy or radiculopathy, lumbar region: Secondary | ICD-10-CM | POA: Diagnosis not present

## 2020-09-26 DIAGNOSIS — M5459 Other low back pain: Secondary | ICD-10-CM | POA: Diagnosis not present

## 2020-10-03 DIAGNOSIS — M5459 Other low back pain: Secondary | ICD-10-CM | POA: Diagnosis not present

## 2020-10-03 DIAGNOSIS — M47816 Spondylosis without myelopathy or radiculopathy, lumbar region: Secondary | ICD-10-CM | POA: Diagnosis not present

## 2020-10-03 DIAGNOSIS — M6281 Muscle weakness (generalized): Secondary | ICD-10-CM | POA: Diagnosis not present

## 2020-10-07 DIAGNOSIS — M47816 Spondylosis without myelopathy or radiculopathy, lumbar region: Secondary | ICD-10-CM | POA: Diagnosis not present

## 2020-10-07 DIAGNOSIS — M5459 Other low back pain: Secondary | ICD-10-CM | POA: Diagnosis not present

## 2020-10-07 DIAGNOSIS — M6281 Muscle weakness (generalized): Secondary | ICD-10-CM | POA: Diagnosis not present

## 2020-10-09 ENCOUNTER — Other Ambulatory Visit: Payer: Self-pay

## 2020-10-09 ENCOUNTER — Other Ambulatory Visit (INDEPENDENT_AMBULATORY_CARE_PROVIDER_SITE_OTHER): Payer: Medicare Other

## 2020-10-09 DIAGNOSIS — I10 Essential (primary) hypertension: Secondary | ICD-10-CM

## 2020-10-09 DIAGNOSIS — E785 Hyperlipidemia, unspecified: Secondary | ICD-10-CM | POA: Diagnosis not present

## 2020-10-09 DIAGNOSIS — E559 Vitamin D deficiency, unspecified: Secondary | ICD-10-CM | POA: Diagnosis not present

## 2020-10-09 DIAGNOSIS — R739 Hyperglycemia, unspecified: Secondary | ICD-10-CM | POA: Diagnosis not present

## 2020-10-09 LAB — COMPREHENSIVE METABOLIC PANEL
ALT: 16 U/L (ref 0–35)
AST: 17 U/L (ref 0–37)
Albumin: 4.2 g/dL (ref 3.5–5.2)
Alkaline Phosphatase: 90 U/L (ref 39–117)
BUN: 16 mg/dL (ref 6–23)
CO2: 30 mEq/L (ref 19–32)
Calcium: 9.3 mg/dL (ref 8.4–10.5)
Chloride: 99 mEq/L (ref 96–112)
Creatinine, Ser: 0.68 mg/dL (ref 0.40–1.20)
GFR: 78.73 mL/min (ref >60.00)
Glucose, Bld: 134 mg/dL — ABNORMAL HIGH (ref 70–99)
Potassium: 4.9 mEq/L (ref 3.5–5.1)
Sodium: 135 mEq/L (ref 135–145)
Total Bilirubin: 0.5 mg/dL (ref 0.2–1.2)
Total Protein: 6.5 g/dL (ref 6.0–8.3)

## 2020-10-09 LAB — LIPID PANEL
Cholesterol: 202 mg/dL — ABNORMAL HIGH (ref 0–200)
HDL: 66 mg/dL (ref >39.00)
LDL Cholesterol: 112 mg/dL — ABNORMAL HIGH (ref 0–99)
NonHDL: 135.93
Total CHOL/HDL Ratio: 3
Triglycerides: 121 mg/dL (ref 0.0–149.0)
VLDL: 24.2 mg/dL (ref 0.0–40.0)

## 2020-10-09 LAB — HEMOGLOBIN A1C: Hgb A1c MFr Bld: 6.2 % (ref 4.6–6.5)

## 2020-10-09 LAB — CBC
HCT: 40.2 % (ref 36.0–46.0)
Hemoglobin: 13.3 g/dL (ref 12.0–15.0)
MCHC: 33.2 g/dL (ref 30.0–36.0)
MCV: 96.4 fl (ref 78.0–100.0)
Platelets: 216 10*3/uL (ref 150.0–400.0)
RBC: 4.17 Mil/uL (ref 3.87–5.11)
RDW: 13.7 % (ref 11.5–15.5)
WBC: 4.9 10*3/uL (ref 4.0–10.5)

## 2020-10-09 LAB — VITAMIN D 25 HYDROXY (VIT D DEFICIENCY, FRACTURES): VITD: 46.18 ng/mL (ref 30.00–100.00)

## 2020-10-09 LAB — TSH: TSH: 2.19 u[IU]/mL (ref 0.35–4.50)

## 2020-10-10 DIAGNOSIS — M6281 Muscle weakness (generalized): Secondary | ICD-10-CM | POA: Diagnosis not present

## 2020-10-10 DIAGNOSIS — M47816 Spondylosis without myelopathy or radiculopathy, lumbar region: Secondary | ICD-10-CM | POA: Diagnosis not present

## 2020-10-10 DIAGNOSIS — M5459 Other low back pain: Secondary | ICD-10-CM | POA: Diagnosis not present

## 2020-10-11 DIAGNOSIS — M791 Myalgia, unspecified site: Secondary | ICD-10-CM | POA: Diagnosis not present

## 2020-10-11 DIAGNOSIS — M461 Sacroiliitis, not elsewhere classified: Secondary | ICD-10-CM | POA: Diagnosis not present

## 2020-10-11 DIAGNOSIS — M47816 Spondylosis without myelopathy or radiculopathy, lumbar region: Secondary | ICD-10-CM | POA: Diagnosis not present

## 2020-10-14 DIAGNOSIS — M6281 Muscle weakness (generalized): Secondary | ICD-10-CM | POA: Diagnosis not present

## 2020-10-14 DIAGNOSIS — M47816 Spondylosis without myelopathy or radiculopathy, lumbar region: Secondary | ICD-10-CM | POA: Diagnosis not present

## 2020-10-14 DIAGNOSIS — M5459 Other low back pain: Secondary | ICD-10-CM | POA: Diagnosis not present

## 2020-10-17 DIAGNOSIS — M6281 Muscle weakness (generalized): Secondary | ICD-10-CM | POA: Diagnosis not present

## 2020-10-17 DIAGNOSIS — M5459 Other low back pain: Secondary | ICD-10-CM | POA: Diagnosis not present

## 2020-10-17 DIAGNOSIS — M47816 Spondylosis without myelopathy or radiculopathy, lumbar region: Secondary | ICD-10-CM | POA: Diagnosis not present

## 2020-10-22 DIAGNOSIS — M461 Sacroiliitis, not elsewhere classified: Secondary | ICD-10-CM | POA: Diagnosis not present

## 2020-10-23 DIAGNOSIS — M47816 Spondylosis without myelopathy or radiculopathy, lumbar region: Secondary | ICD-10-CM | POA: Diagnosis not present

## 2020-10-23 DIAGNOSIS — M5459 Other low back pain: Secondary | ICD-10-CM | POA: Diagnosis not present

## 2020-10-23 DIAGNOSIS — M6281 Muscle weakness (generalized): Secondary | ICD-10-CM | POA: Diagnosis not present

## 2020-10-25 DIAGNOSIS — M5459 Other low back pain: Secondary | ICD-10-CM | POA: Diagnosis not present

## 2020-10-25 DIAGNOSIS — L820 Inflamed seborrheic keratosis: Secondary | ICD-10-CM | POA: Diagnosis not present

## 2020-10-25 DIAGNOSIS — M47816 Spondylosis without myelopathy or radiculopathy, lumbar region: Secondary | ICD-10-CM | POA: Diagnosis not present

## 2020-10-25 DIAGNOSIS — L918 Other hypertrophic disorders of the skin: Secondary | ICD-10-CM | POA: Diagnosis not present

## 2020-10-25 DIAGNOSIS — L814 Other melanin hyperpigmentation: Secondary | ICD-10-CM | POA: Diagnosis not present

## 2020-10-25 DIAGNOSIS — M6281 Muscle weakness (generalized): Secondary | ICD-10-CM | POA: Diagnosis not present

## 2020-10-25 DIAGNOSIS — L821 Other seborrheic keratosis: Secondary | ICD-10-CM | POA: Diagnosis not present

## 2020-10-28 DIAGNOSIS — M6281 Muscle weakness (generalized): Secondary | ICD-10-CM | POA: Diagnosis not present

## 2020-10-28 DIAGNOSIS — M47816 Spondylosis without myelopathy or radiculopathy, lumbar region: Secondary | ICD-10-CM | POA: Diagnosis not present

## 2020-10-28 DIAGNOSIS — M5459 Other low back pain: Secondary | ICD-10-CM | POA: Diagnosis not present

## 2020-10-31 ENCOUNTER — Telehealth: Payer: Self-pay | Admitting: Pharmacist

## 2020-10-31 NOTE — Progress Notes (Addendum)
° ° °  Chronic Care Management Pharmacy Assistant   Name: Laurann Mcmorris  MRN: [id number] DOB: 03-23-1932  Reason for Encounter: General Disease State Call  Patient Questions:  1.  Have you seen any other providers since your last visit? Yes.   2.  Any changes in your medicines or health? Yes.   PCP : [name], MD   Their chronic conditions include: Hypertension, Hyperlipidemia, Pre-Diabetes, Hypothyroidism, GERD, Osteopenia, Glaucoma.  Office Visits: None since 09/10/20  Consults:  09/23/20 Gynecology Damian Leavell, MD. Labs drawn.Scheduled images for later. Per note: The doctor and patient discussed options to improve urinary incontinence. STARTED Myrbetriq 50 mg daily, Premarin 0.5g vaginally nightly for 3 weeks then decrease to 2 x weekly, Nitrofurantoin 100 mg BID x 5 days for acute UTI.  Allergies:   Allergies  Allergen Reactions   Other Other (See Comments)    Seasonal allergies(pollen & grass)    Medications: Outpatient Encounter Medications as of 10/31/2020  Medication Sig   Biotin 10000 MCG TABS Take by mouth.   brimonidine (ALPHAGAN) 0.2 % ophthalmic solution Place 1 drop into both eyes 2 (two) times daily.   famotidine (PEPCID) 40 MG tablet TAKE 1 TABLET BY MOUTH AT  BEDTIME AS NEEDED   levothyroxine (SYNTHROID) 125 MCG tablet TAKE 1 TABLET BY MOUTH  DAILY   losartan (COZAAR) 50 MG tablet TAKE 1 TABLET BY MOUTH  DAILY   Multiple Vitamins-Minerals (MULTIVITAMIN WITH MINERALS) tablet Take 1 tablet by mouth daily.   omeprazole (PRILOSEC) 20 MG capsule TAKE 1 CAPSULE BY MOUTH  DAILY   polyethylene glycol (MIRALAX / GLYCOLAX) packet Take 17 g by mouth daily.   No facility-administered encounter medications on file as of 10/31/2020.    Current Diagnosis: Patient Active Problem List   Diagnosis Date Noted   Sun-damaged skin 06/02/2019   Estrogen deficiency 11/13/2018   . 08/25/2018   Low back pain 06/12/2018   Hyperglycemia 01/28/2018   Recurrent  sinusitis 10/24/2017   Urinary incontinence 10/18/2017   Physical deconditioning 10/18/2017   History of cervical fracture 06/09/2017   Hypoxia 05/14/2017   Sternal fracture 04/30/2017   Parotiditis 04/21/2016   Breast cancer screening 04/21/2016   Atrophic rhinitis 04/21/2016   Obesity 04/02/2016   Diverticulosis of colon without hemorrhage 04/02/2016   Vitamin D deficiency 04/02/2016   Hyperlipidemia, mild 04/02/2016   Hypertension    Arthritis    Hiatal hernia with gastroesophageal reflux    Glaucoma    Hypothyroidism    Primary osteoarthritis of right hip 03/22/2015    Goals Addressed   None    GEN Call:  Patient stated she is not doing any exercising at this time due to her getting injections. She did state she is still actively doing physical therapy. She stated she makes sure she eats three meals a day. Patient stated she has been eating more vegetables lately, as well as always ensuring she gets protein at lunch and dinner. Patient stated she drinks about 6 glasses of water. She stated she doesn't have any concerns about her medication at this time.  Follow-Up:  Pharmacist Review   Charlann Lange, RMA Clinical Pharmacist Assistant [phone]  10 minutes spent in review, coordination, and documentation.  Reviewed by: Beverly Milch, PharmD Clinical Pharmacist [facility] [phone]

## 2020-11-12 DIAGNOSIS — M6281 Muscle weakness (generalized): Secondary | ICD-10-CM | POA: Diagnosis not present

## 2020-11-12 DIAGNOSIS — M5459 Other low back pain: Secondary | ICD-10-CM | POA: Diagnosis not present

## 2020-11-12 DIAGNOSIS — M47816 Spondylosis without myelopathy or radiculopathy, lumbar region: Secondary | ICD-10-CM | POA: Diagnosis not present

## 2020-11-13 ENCOUNTER — Other Ambulatory Visit: Payer: Self-pay

## 2020-11-13 ENCOUNTER — Ambulatory Visit (HOSPITAL_BASED_OUTPATIENT_CLINIC_OR_DEPARTMENT_OTHER)
Admission: RE | Admit: 2020-11-13 | Discharge: 2020-11-13 | Disposition: A | Discharge status: Home / Self Care | Payer: Medicare Other | Source: Ambulatory Visit | Attending: Family Medicine | Admitting: Family Medicine

## 2020-11-13 DIAGNOSIS — R921 Mammographic calcification found on diagnostic imaging of breast: Secondary | ICD-10-CM | POA: Insufficient documentation

## 2020-11-13 DIAGNOSIS — Z1231 Encounter for screening mammogram for malignant neoplasm of breast: Secondary | ICD-10-CM | POA: Insufficient documentation

## 2020-11-13 DIAGNOSIS — Z9189 Other specified personal risk factors, not elsewhere classified: Secondary | ICD-10-CM

## 2020-11-14 DIAGNOSIS — M5459 Other low back pain: Secondary | ICD-10-CM | POA: Diagnosis not present

## 2020-11-14 DIAGNOSIS — M47816 Spondylosis without myelopathy or radiculopathy, lumbar region: Secondary | ICD-10-CM | POA: Diagnosis not present

## 2020-11-14 DIAGNOSIS — M6281 Muscle weakness (generalized): Secondary | ICD-10-CM | POA: Diagnosis not present

## 2020-11-15 DIAGNOSIS — M461 Sacroiliitis, not elsewhere classified: Secondary | ICD-10-CM | POA: Diagnosis not present

## 2020-11-18 ENCOUNTER — Other Ambulatory Visit: Payer: Self-pay | Admitting: Family Medicine

## 2020-11-18 DIAGNOSIS — R928 Other abnormal and inconclusive findings on diagnostic imaging of breast: Secondary | ICD-10-CM

## 2020-11-20 DIAGNOSIS — M5459 Other low back pain: Secondary | ICD-10-CM | POA: Diagnosis not present

## 2020-11-20 DIAGNOSIS — M6281 Muscle weakness (generalized): Secondary | ICD-10-CM | POA: Diagnosis not present

## 2020-11-20 DIAGNOSIS — M47816 Spondylosis without myelopathy or radiculopathy, lumbar region: Secondary | ICD-10-CM | POA: Diagnosis not present

## 2020-11-25 ENCOUNTER — Ambulatory Visit (INDEPENDENT_AMBULATORY_CARE_PROVIDER_SITE_OTHER): Payer: Medicare Other | Admitting: Family

## 2020-11-25 ENCOUNTER — Ambulatory Visit (HOSPITAL_BASED_OUTPATIENT_CLINIC_OR_DEPARTMENT_OTHER)
Admission: RE | Admit: 2020-11-25 | Discharge: 2020-11-25 | Disposition: A | Discharge status: Home / Self Care | Payer: Medicare Other | Source: Ambulatory Visit | Attending: Family | Admitting: Family

## 2020-11-25 ENCOUNTER — Other Ambulatory Visit: Payer: Self-pay

## 2020-11-25 ENCOUNTER — Other Ambulatory Visit (HOSPITAL_COMMUNITY): Payer: Self-pay | Admitting: Family

## 2020-11-25 VITALS — BP 128/73 | HR 73 | Temp 97.9°F | Resp 16 | Ht 65.0 in | Wt 216.2 lb

## 2020-11-25 DIAGNOSIS — M25571 Pain in right ankle and joints of right foot: Secondary | ICD-10-CM | POA: Diagnosis not present

## 2020-11-25 DIAGNOSIS — M7989 Other specified soft tissue disorders: Secondary | ICD-10-CM | POA: Diagnosis not present

## 2020-11-25 DIAGNOSIS — J0100 Acute maxillary sinusitis, unspecified: Secondary | ICD-10-CM

## 2020-11-25 DIAGNOSIS — R600 Localized edema: Secondary | ICD-10-CM

## 2020-11-25 DIAGNOSIS — R0989 Other specified symptoms and signs involving the circulatory and respiratory systems: Secondary | ICD-10-CM

## 2020-11-25 MED ORDER — AMOXICILLIN-POT CLAVULANATE 875-125 MG PO TABS: 1.0000 | ORAL_TABLET | Freq: Two times a day (BID) | ORAL | 0 refills | Status: DC

## 2020-11-25 NOTE — Progress Notes (Signed)
Subjective:    Patient ID: Catherine Coleman, female    DOB: 11-Jan-1932, 87 y.o.   MRN: [id number]  HPI  Patient is an 87 year old female who presents today with chief complaint of right ankle pain.  She reports that pain began approximately 1 month ago.   Right ankle pain.  Notes some frontal sinus headaches.  "feels clogged up all the time. "  No fever that she is aware of. Denies SOB.   Did a lifescreening and had circulation screening which showed 0.78 on the left and 0.99 on the right. She was advised to follow up with PCP.   Review of Systems    see HPI  Past Medical History:  Diagnosis Date  . Arthritis   . Atrophic rhinitis 04/21/2016  . Constipation   . Decreased hearing   . Diverticulitis   . Dry eyes   . Fatigue   . Floaters in visual field   . Gall bladder disease   . GERD (gastroesophageal reflux disease)   . Glaucoma   . Hiatal hernia with gastroesophageal reflux   . History of blood transfusion    for Knee replacement  . Hyperlipidemia, mild 04/02/2016  . Hypertension   . Hypothyroidism   . Joint pain   . Migraine aura without headache   . Muscle pain   . Muscle stiffness   . Nasal congestion   . Obesity 04/02/2016  . Osteoarthritis   . Parotiditis 04/21/2016  . Vitamin D deficiency 04/02/2016     Social History   Socioeconomic History  . Marital status: Widowed    Spouse name: Not on file  . Number of children: Not on file  . Years of education: Not on file  . Highest education level: Not on file  Occupational History  . Occupation: Retired  Tobacco Use  . Smoking status: Former Research scientist (life sciences)  . Smokeless tobacco: Never Used  . Tobacco comment: Stopped 50 years ago.   Vaping Use  . Vaping Use: Never used  Substance and Sexual Activity  . Alcohol use: Yes    Alcohol/week: 0.0 standard drinks    Comment: socially 3-4x/wk  . Drug use: No  . Sexual activity: Never  Other Topics Concern  . Not on file  Social History Narrative   Lives at [facility], widowed 7 years, no dietary restrictions. Retired from neuropsychiatry work.    Social Determinants of Health   Financial Resource Strain: Low Risk   . Difficulty of Paying Living Expenses: Not hard at all  Food Insecurity: Not on file  Transportation Needs: Not on file  Physical Activity: Not on file  Stress: Not on file  Social Connections: Not on file  Intimate Partner Violence: Not on file    Past Surgical History:  Procedure Laterality Date  . APPENDECTOMY    . cataract surgery  2008  . CHOLECYSTECTOMY  2010  . JOINT REPLACEMENT Left    2012  . MASTOIDECTOMY    . POSTERIOR CERVICAL FUSION/FORAMINOTOMY N/A 04/27/2017   Procedure: CERVICAL FIVE-SIX  OPEN REDUCTION OF FRACTURE, CERVICAL THREE-SEVEN  DORSAL FIXATION AND FUSION;  Surgeon: Ditty, Kevan Ny, MD;  Location: [facility];  Service: Neurosurgery;  Laterality: N/A;  . SHOULDER ARTHROSCOPY Bilateral prior to 2010  . TONSILLECTOMY    . TOTAL HIP ARTHROPLASTY Right 03/22/2015   Procedure: RIGHT TOTAL HIP ARTHROPLASTY ANTERIOR APPROACH;  Surgeon: Dorna Leitz, MD;  Location: [facility];  Service: Orthopedics;  Laterality: Right;  . TOTAL KNEE ARTHROPLASTY Bilateral 2007  .  TUBAL LIGATION      Family History  Problem Relation Age of Onset  . Dementia Mother   . Hypertension Mother   . Obesity Mother   . Heart disease Father   . Hypertension Father   . Stroke Father   . Diabetes Father   . Hyperlipidemia Father   . Alcoholism Father   . Obesity Father   . Arthritis Daughter   . Cancer Maternal Grandfather        colon cancer    Allergies  Allergen Reactions  . Other Other (See Comments)    Seasonal allergies(pollen & grass)    Current Outpatient Medications on File Prior to Visit  Medication Sig Dispense Refill  . Biotin 10000 MCG TABS Take by mouth.    . brimonidine (ALPHAGAN) 0.2 % ophthalmic solution Place 1 drop into both eyes 2 (two) times daily. 5 mL 12  . famotidine (PEPCID) 40 MG tablet TAKE 1  TABLET BY MOUTH AT  BEDTIME AS NEEDED 90 tablet 3  . levothyroxine (SYNTHROID) 125 MCG tablet TAKE 1 TABLET BY MOUTH  DAILY 90 tablet 3  . losartan (COZAAR) 50 MG tablet TAKE 1 TABLET BY MOUTH  DAILY 90 tablet 3  . mirabegron ER (MYRBETRIQ) 50 MG TB24 tablet Take by mouth.    . Multiple Vitamins-Minerals (MULTIVITAMIN WITH MINERALS) tablet Take 1 tablet by mouth daily.    Marland Kitchen omeprazole (PRILOSEC) 20 MG capsule TAKE 1 CAPSULE BY MOUTH  DAILY 90 capsule 3  . polyethylene glycol (MIRALAX / GLYCOLAX) packet Take 17 g by mouth daily. 14 each 0   No current facility-administered medications on file prior to visit.    BP 128/73 (BP Location: Right Arm, Patient Position: Sitting, Cuff Size: Large)   Pulse 73   Temp 97.9 F (36.6 C) (Oral)   Resp 16   Ht 5\' 5"  (1.651 m)   Wt 216 lb 3.2 oz (98.1 kg)   SpO2 98%   BMI 35.98 kg/m    Objective:   Physical Exam Constitutional:      Appearance: She is well-developed.  HENT:     Nose:     Right Sinus: Maxillary sinus tenderness present. No frontal sinus tenderness.     Left Sinus: Maxillary sinus tenderness present. No frontal sinus tenderness.  Cardiovascular:     Rate and Rhythm: Normal rate and regular rhythm.     Pulses:          Dorsalis pedis pulses are 1+ on the right side and 1+ on the left side.       Posterior tibial pulses are 1+ on the right side and 1+ on the left side.     Heart sounds: Normal heart sounds. No murmur heard.   Pulmonary:     Effort: Pulmonary effort is normal. No respiratory distress.     Breath sounds: Normal breath sounds. No wheezing.  Musculoskeletal:     Right lower leg: 1+ Edema present.     Left lower leg: 1+ Edema present.     Comments: Right ankle- only mild swelling of RLE no specific ankle swelling, erythema.  Mild discomfort with ROM  Psychiatric:        Behavior: Behavior normal.        Thought Content: Thought content normal.        Judgment: Judgment normal.           Assessment &  Plan:  Decreased LE pulses- will send for formal ABI.  LE edema- she is  concerned about this. Her cardiac exam is normal and lung exam is normal.  She does not have any signs/symptoms of volume overload. Likely due to some chronic venous stasis. Recommended elevating as able.  Sinusitis- new. Rx with augmentin 875mg  bid and add claritin or zyrtec once daily.  R ankle pain- likely OA, will obtain x-ray for further evaluation.   This visit occurred during the SARS-CoV-2 public health emergency.  Safety protocols were in place, including screening questions prior to the visit, additional usage of staff PPE, and extensive cleaning of exam room while observing appropriate contact time as indicated for disinfecting solutions.

## 2020-11-25 NOTE — Patient Instructions (Signed)
Please complete x-ray on the first floor. You should be contacted about scheduling your leg ultrasound. Add claritin or zyrtec one tab once daily for allergies. Begin augmentin twice daily for sinus infection.

## 2020-12-02 ENCOUNTER — Other Ambulatory Visit: Payer: Self-pay | Admitting: Family Medicine

## 2020-12-02 ENCOUNTER — Other Ambulatory Visit: Payer: Self-pay

## 2020-12-02 ENCOUNTER — Ambulatory Visit
Admission: RE | Admit: 2020-12-02 | Discharge: 2020-12-02 | Disposition: A | Discharge status: Home / Self Care | Payer: Medicare Other | Source: Ambulatory Visit | Attending: Family Medicine | Admitting: Family Medicine

## 2020-12-02 ENCOUNTER — Ambulatory Visit: Payer: Medicare Other

## 2020-12-02 DIAGNOSIS — R922 Inconclusive mammogram: Secondary | ICD-10-CM | POA: Diagnosis not present

## 2020-12-02 DIAGNOSIS — R928 Other abnormal and inconclusive findings on diagnostic imaging of breast: Secondary | ICD-10-CM

## 2020-12-02 DIAGNOSIS — N6489 Other specified disorders of breast: Secondary | ICD-10-CM | POA: Diagnosis not present

## 2020-12-02 DIAGNOSIS — R921 Mammographic calcification found on diagnostic imaging of breast: Secondary | ICD-10-CM | POA: Diagnosis not present

## 2020-12-11 ENCOUNTER — Other Ambulatory Visit: Payer: Self-pay

## 2020-12-11 ENCOUNTER — Ambulatory Visit
Admission: RE | Admit: 2020-12-11 | Discharge: 2020-12-11 | Disposition: A | Discharge status: Home / Self Care | Payer: Medicare Other | Source: Ambulatory Visit | Attending: Family Medicine | Admitting: Family Medicine

## 2020-12-11 DIAGNOSIS — R928 Other abnormal and inconclusive findings on diagnostic imaging of breast: Secondary | ICD-10-CM

## 2020-12-11 DIAGNOSIS — C50312 Malignant neoplasm of lower-inner quadrant of left female breast: Secondary | ICD-10-CM | POA: Diagnosis not present

## 2020-12-11 DIAGNOSIS — R921 Mammographic calcification found on diagnostic imaging of breast: Secondary | ICD-10-CM | POA: Diagnosis not present

## 2020-12-11 DIAGNOSIS — N641 Fat necrosis of breast: Secondary | ICD-10-CM | POA: Diagnosis not present

## 2020-12-11 DIAGNOSIS — N6324 Unspecified lump in the left breast, lower inner quadrant: Secondary | ICD-10-CM | POA: Diagnosis not present

## 2020-12-13 ENCOUNTER — Encounter: Payer: Self-pay | Admitting: *Deleted

## 2020-12-13 ENCOUNTER — Telehealth: Payer: Self-pay | Admitting: Oncology

## 2020-12-13 DIAGNOSIS — Z170 Estrogen receptor positive status [ER+]: Secondary | ICD-10-CM | POA: Insufficient documentation

## 2020-12-13 DIAGNOSIS — C50919 Malignant neoplasm of unspecified site of unspecified female breast: Secondary | ICD-10-CM

## 2020-12-13 DIAGNOSIS — C50312 Malignant neoplasm of lower-inner quadrant of left female breast: Secondary | ICD-10-CM

## 2020-12-13 HISTORY — DX: Malignant neoplasm of unspecified site of unspecified female breast: C50.919

## 2020-12-13 NOTE — Telephone Encounter (Signed)
Spoke to patient to confirm morning [facility] appointment for 4/6, packet emailed to patient

## 2020-12-17 NOTE — Progress Notes (Signed)
Catherine Coleman  [phone] [phone]     ID: Catherine Coleman DOB: May 26, 1932  MR#: [id number]  HMC#:[id number]  Patient Care Team: [name], MD as PCP - General (Family Medicine) Day, Melvenia Beam, [facility] (Inactive) as Pharmacist (Pharmacist) Mauro Kaufmann, RN as Oncology Nurse Navigator Rockwell Germany, RN as Oncology Nurse Navigator Gwynn Chalker, Virgie Dad, MD as Consulting Physician (Oncology) [name], MD as Consulting Physician (General Surgery) Kyung Rudd, MD as Consulting Physician (Radiation Oncology) Damian Leavell, MD as Consulting Physician (Obstetrics and Gynecology) Zonia Kief, MD as Consulting Physician (Rehabilitation) Ulla Gallo, MD as Consulting Physician (Dermatology) Aurea Graff OTHER MD:  CHIEF COMPLAINT: Estrogen receptor positive breast cancer  CURRENT TREATMENT: Awaiting definitive surgery   HISTORY OF CURRENT ILLNESS: Waneda Klammer had routine screening mammography on 11/13/2020 showing a possible abnormality in the bilateral breasts. She underwent bilateral diagnostic mammography with tomography and left breast ultrasonography at [facility] on 12/02/2020 showing: breast density category B; 10 mm left breast mass at 7 o'clock; no suspicious left axillary lymphadenopathy; tow groups of indeterminate calcifications involving upper-inner and upper-outer right breast, each group spanning up to 6 cm.  Accordingly on 12/11/2020 she proceeded to biopsy of the bilateral breast areas in question. The pathology from this procedure ([id number]) showed:  1. Left Breast  - invasive ductal carcinoma, grade 2.   - Prognostic indicators significant for: estrogen receptor, 95% positive and progesterone receptor, 60% positive, both with strong staining intensity. Proliferation marker Ki67 at 15%. HER2 equivocal by immunohistochemistry (2+), but negative by fluorescent in situ hybridization with a signals ratio  1.34 and number per cell 1.95. 2. Right Breast  - fat necrosis and dystrophic calcifications  Cancer Staging Malignant neoplasm of lower-inner quadrant of left breast in female, estrogen receptor positive ([place]) Staging form: Breast, AJCC 8th Edition - Clinical stage from 12/18/2020: Stage IA (cT1b, cN0, cM0, G2, ER+, PR+, HER2-) - Unsigned Stage prefix: Initial diagnosis Histologic grading system: 3 grade system  The patient's subsequent history is as detailed below.   INTERVAL HISTORY: Leler was evaluated in the multidisciplinary breast cancer clinic on 12/18/2020 . Her case was also presented at the multidisciplinary breast cancer conference on the same day. At that time a preliminary plan was proposed: Lumpectomy with no sentinel lymph node sampling, likely no adjuvant radiation, no chemotherapy, antiestrogens   REVIEW OF SYSTEMS: There were no specific symptoms leading to the original mammogram, which was routinely scheduled. On the provided questionnaire, Greysen reports throbbing right hip and ankle pain, wearing a hearing aid, sinus problems, heartburn, hernia, incontinence, joint pain, arthritis, difficulties walking, thyroid problem, and history of blood transfusion(s). The patient denies unusual headaches, visual changes, nausea, vomiting, stiff neck, dizziness, or gait imbalance. There has been no cough, phlegm production, or pleurisy, no chest pain or pressure, and no change in bowel or bladder habits. The patient denies fever, rash, bleeding, unexplained fatigue or unexplained weight loss. A detailed review of systems was otherwise entirely negative.   COVID 19 VACCINATION STATUS: Status post Moderna x2 with booster October 2021   PAST MEDICAL HISTORY: Past Medical History:  Diagnosis Date  . Arthritis   . Atrophic rhinitis 04/21/2016  . Constipation   . Decreased hearing   . Diverticulitis   . Dry eyes   . Fatigue   . Floaters in visual field   . Gall bladder disease   .  GERD (gastroesophageal reflux disease)   . Glaucoma   . Hiatal hernia  with gastroesophageal reflux   . History of blood transfusion    for Knee replacement  . Hyperlipidemia, mild 04/02/2016  . Hypertension   . Hypothyroidism   . Joint pain   . Migraine aura without headache   . Muscle pain   . Muscle stiffness   . Nasal congestion   . Obesity 04/02/2016  . Osteoarthritis   . Parotiditis 04/21/2016  . Vitamin D deficiency 04/02/2016    PAST SURGICAL HISTORY: Past Surgical History:  Procedure Laterality Date  . APPENDECTOMY    . cataract surgery  2008  . CHOLECYSTECTOMY  2010  . JOINT REPLACEMENT Left    2012  . MASTOIDECTOMY    . POSTERIOR CERVICAL FUSION/FORAMINOTOMY N/A 04/27/2017   Procedure: CERVICAL FIVE-SIX  OPEN REDUCTION OF FRACTURE, CERVICAL THREE-SEVEN  DORSAL FIXATION AND FUSION;  Surgeon: Ditty, Kevan Ny, MD;  Location: [facility];  Service: Neurosurgery;  Laterality: N/A;  . SHOULDER ARTHROSCOPY Bilateral prior to 2010  . TONSILLECTOMY    . TOTAL HIP ARTHROPLASTY Right 03/22/2015   Procedure: RIGHT TOTAL HIP ARTHROPLASTY ANTERIOR APPROACH;  Surgeon: Dorna Leitz, MD;  Location: [facility];  Service: Orthopedics;  Laterality: Right;  . TOTAL KNEE ARTHROPLASTY Bilateral 2007  . TUBAL LIGATION      FAMILY HISTORY: Family History  Problem Relation Age of Onset  . Dementia Mother   . Hypertension Mother   . Obesity Mother   . Heart disease Father   . Hypertension Father   . Stroke Father   . Diabetes Father   . Hyperlipidemia Father   . Alcoholism Father   . Obesity Father   . Arthritis Daughter   . Cancer Maternal Grandfather        colon cancer   Her father died at age 1 from MI. Her mother died at age 53 from dementia. Yukari has one brother (and no sisters). She reports colon cancer in her maternal grandfather. There is no family history of breast or ovarian cancer to her knowledge.   GYNECOLOGIC HISTORY:  No LMP recorded. Patient is  postmenopausal. Menarche: 87 years old Age at first live birth: 87 years old [place] P 4 LMP date unsure, "51-90?" Contraceptive: used from [phone] HRT never used  Hysterectomy? no BSO? 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 She is widowed. She lives independently by herself at [facility]. Daughter [name], age 19, works with computers in [place], [place]. Daughter [name], age 59, is retired but Musician and lives in [place], [place]. Daughter Nakeita Styles, age 86, works in Scientist, research (life sciences) estate in [place], [place]. Daughter Dezzie Badilla, age 48, works with computers in [place], [place]. Magda Paganini and [place] graduated from [employer]. Laquashia has 5 grandchildren, two of whom are MD's.    ADVANCED DIRECTIVES: in place   HEALTH MAINTENANCE: Social History   Tobacco Use  . Smoking status: Former Research scientist (life sciences)  . Smokeless tobacco: Never Used  . Tobacco comment: Stopped 50 years ago.   Vaping Use  . Vaping Use: Never used  Substance Use Topics  . Alcohol use: Yes    Alcohol/week: 0.0 standard drinks    Comment: socially 3-4x/wk  . Drug use: No     Colonoscopy: 2014 (Dr. Stephens November), repeat not indicated (age)  PAP: date unknown  Bone density: 07/2019, -1.7   Allergies  Allergen Reactions  . Other Other (See Comments)    Seasonal allergies(pollen & grass)    Current Outpatient  Medications  Medication Sig Dispense Refill  . amoxicillin-clavulanate (AUGMENTIN) 875-125 MG tablet Take 1 tablet by mouth 2 (two) times daily. 20 tablet 0  . amoxicillin-clavulanate (AUGMENTIN) 875-125 MG tablet TAKE 1 TABLET BY MOUTH 2 (TWO) TIMES DAILY. 20 tablet 0  . Biotin 10000 MCG TABS Take by mouth.    . brimonidine (ALPHAGAN) 0.2 % ophthalmic solution Place 1 drop into both eyes 2 (two) times daily. 5 mL 12  . famotidine (PEPCID) 40 MG tablet TAKE 1 TABLET BY MOUTH AT  BEDTIME AS NEEDED 90 tablet 3  .  levothyroxine (SYNTHROID) 125 MCG tablet TAKE 1 TABLET BY MOUTH  DAILY 90 tablet 3  . losartan (COZAAR) 50 MG tablet TAKE 1 TABLET BY MOUTH  DAILY 90 tablet 3  . mirabegron ER (MYRBETRIQ) 50 MG TB24 tablet Take by mouth.    . Multiple Vitamins-Minerals (MULTIVITAMIN WITH MINERALS) tablet Take 1 tablet by mouth daily.    Marland Kitchen omeprazole (PRILOSEC) 20 MG capsule TAKE 1 CAPSULE BY MOUTH  DAILY 90 capsule 3  . polyethylene glycol (MIRALAX / GLYCOLAX) packet Take 17 g by mouth daily. 14 each 0   No current facility-administered medications for this visit.    OBJECTIVE: White woman who appears stated age  34:   12/18/20 0909  BP: (!) 145/70  Pulse: 66  Resp: 20  Temp: 97.7 F (36.5 C)  SpO2: 95%     Body mass index is 35.84 kg/m.   Wt Readings from Last 3 Encounters:  12/18/20 215 lb 6.4 oz (97.7 kg)  11/25/20 216 lb 3.2 oz (98.1 kg)  09/05/20 216 lb (98 kg)      ECOG FS:1 - Symptomatic but completely ambulatory  Ocular: Sclerae unicteric, pupils round and equal Ear-nose-throat: Wearing a mask Lymphatic: No cervical or supraclavicular adenopathy Lungs no rales or rhonchi Heart regular rate and rhythm Abd soft, nontender, positive bowel sounds MSK no focal spinal tenderness, no joint edema Neuro: non-focal, well-oriented, appropriate affect Breasts: The right breast is benign.  There is a moderate ecchymosis.  There is a large ecchymosis on the left side.  I do not palpate a mass in either breast.  Both axillae are benign.   LAB RESULTS:  CMP     Component Value Date/Time   NA 138 12/18/2020 0807   NA 137 09/28/2018 1147   K 4.7 12/18/2020 0807   CL 100 12/18/2020 0807   CO2 25 12/18/2020 0807   GLUCOSE 134 (H) 12/18/2020 0807   BUN 14 12/18/2020 0807   BUN 9 09/28/2018 1147   CREATININE 0.75 12/18/2020 0807   CALCIUM 8.7 (L) 12/18/2020 0807   PROT 6.8 12/18/2020 0807   PROT 6.7 09/28/2018 1147   ALBUMIN 3.9 12/18/2020 0807   ALBUMIN 4.6 09/28/2018 1147   AST 18  12/18/2020 0807   ALT 19 12/18/2020 0807   ALKPHOS 105 12/18/2020 0807   BILITOT 0.4 12/18/2020 0807   GFRNONAA >60 12/18/2020 0807   GFRAA 94 09/28/2018 1147    No results found for: TOTALPROTELP, ALBUMINELP, A1GS, A2GS, BETS, BETA2SER, GAMS, MSPIKE, SPEI  Lab Results  Component Value Date   WBC 4.8 12/18/2020   NEUTROABS 2.8 12/18/2020   HGB 13.7 12/18/2020   HCT 41.6 12/18/2020   MCV 97.2 12/18/2020   PLT 213 12/18/2020    No results found for: LABCA2  No components found for: ACZYSA630  No results for input(s): INR in the last 168 hours.  No results found for: LABCA2  No results found for:  BOF751  No results found for: CAN125  No results found for: WCH852  No results found for: CA2729  No components found for: HGQUANT  No results found for: CEA1 / No results found for: CEA1   No results found for: AFPTUMOR  No results found for: CHROMOGRNA  No results found for: KPAFRELGTCHN, LAMBDASER, KAPLAMBRATIO (kappa/lambda light chains)  No results found for: HGBA, HGBA2QUANT, HGBFQUANT, HGBSQUAN (Hemoglobinopathy evaluation)   No results found for: LDH  No results found for: IRON, TIBC, IRONPCTSAT (Iron and TIBC)  No results found for: FERRITIN  Urinalysis    Component Value Date/Time   COLORURINE YELLOW 10/03/2019 1156   APPEARANCEUR [place] (A) 10/03/2019 1156   LABSPEC 1.010 10/03/2019 1156   PHURINE 7.0 10/03/2019 1156   GLUCOSEU NEGATIVE 10/03/2019 [place] 10/03/2019 [place] 10/03/2019 1156   KETONESUR NEGATIVE 10/03/2019 1156   PROTEINUR NEGATIVE 05/14/2017 0757   UROBILINOGEN 0.2 10/03/2019 1156   NITRITE NEGATIVE 10/03/2019 1156   LEUKOCYTESUR TRACE (A) 10/03/2019 1156     STUDIES: DG Ankle Complete Right  Result Date: 11/25/2020 CLINICAL DATA:  Right ankle pain, lateral swelling for 1 month EXAM: RIGHT ANKLE - COMPLETE 3+ VIEW COMPARISON:  11/15/2019 FINDINGS: Frontal, oblique, and lateral views of  the right ankle are obtained. No acute fracture, subluxation, or dislocation. Stable small inferior calcaneal spur. Mild anterior soft tissue swelling. IMPRESSION: 1. Mild anterior soft tissue swelling.  No acute bony abnormality. Electronically Signed   By: Randa Ngo M.D.   On: 11/25/2020 22:00   MM Digital Diagnostic Bilat  Addendum Date: 12/13/2020   ADDENDUM REPORT: 12/13/2020 08:04 ADDENDUM: This is an addendum to the report originally dictated on 12/02/2020. Within the findings section, the diffuse round and punctate calcifications are within the medial and lateral RIGHT breast. Electronically Signed   By: Kristopher Oppenheim M.D.   On: 12/13/2020 08:04   Result Date: 12/13/2020 CLINICAL DATA:  87 year old female recalled from screening mammogram dated 11/13/2020 for right breast calcifications and possible left breast mass. EXAM: DIGITAL DIAGNOSTIC BILATERAL MAMMOGRAM WITH CAD; ULTRASOUND LEFT BREAST LIMITED TECHNIQUE: Bilateral digital diagnostic mammography was performed. Mammographic images were processed with CAD.; Targeted ultrasound examination of the left breast was performed COMPARISON:  Previous exam(s). ACR Breast Density Category b: There are scattered areas of fibroglandular density. FINDINGS: There is a persistent irregular hyperdense mass in the periareolar left breast. Further evaluation with ultrasound was performed. Additionally, diffuse round and punctate calcifications in the lateral and medial left breast are new from prior comparison mammograms. Each group spans up to 6 cm in the AP dimension. They demonstrate a somewhat segmental distribution. Targeted ultrasound is performed, showing an irregular hypoechoic mass with associated vascularity at the 7 o'clock position 1 cm from the nipple. It measures 10 x 7 x 5 mm. This correlates well with the mammographic finding. Evaluation of the left axilla demonstrates no suspicious lymphadenopathy. IMPRESSION: 1. Suspicious left breast mass at  the 7 o'clock position. Recommendation is for ultrasound-guided biopsy. 2. No suspicious left axillary lymphadenopathy. 3. Two groups of indeterminate calcifications involving the upper inner and upper outer right breast. Each group spans up to 6 cm in the AP dimension. Recommendation is for stereotactic biopsy of both groups. RECOMMENDATION: Ultrasound-guided biopsy of the left breast and two-area stereotactic guided biopsy of the right breast. I have discussed the findings and recommendations with the patient. If applicable, a reminder letter will be sent to the patient regarding the next  appointment. BI-RADS CATEGORY  4: Suspicious. Electronically Signed: By: Kristopher Oppenheim M.D. On: 12/02/2020 13:01   US BREAST LTD UNI LEFT INC AXILLA  Addendum Date: 12/13/2020   ADDENDUM REPORT: 12/13/2020 08:04 ADDENDUM: This is an addendum to the report originally dictated on 12/02/2020. Within the findings section, the diffuse round and punctate calcifications are within the medial and lateral RIGHT breast. Electronically Signed   By: Kristopher Oppenheim M.D.   On: 12/13/2020 08:04   Result Date: 12/13/2020 CLINICAL DATA:  87 year old female recalled from screening mammogram dated 11/13/2020 for right breast calcifications and possible left breast mass. EXAM: DIGITAL DIAGNOSTIC BILATERAL MAMMOGRAM WITH CAD; ULTRASOUND LEFT BREAST LIMITED TECHNIQUE: Bilateral digital diagnostic mammography was performed. Mammographic images were processed with CAD.; Targeted ultrasound examination of the left breast was performed COMPARISON:  Previous exam(s). ACR Breast Density Category b: There are scattered areas of fibroglandular density. FINDINGS: There is a persistent irregular hyperdense mass in the periareolar left breast. Further evaluation with ultrasound was performed. Additionally, diffuse round and punctate calcifications in the lateral and medial left breast are new from prior comparison mammograms. Each group spans up to 6 cm in  the AP dimension. They demonstrate a somewhat segmental distribution. Targeted ultrasound is performed, showing an irregular hypoechoic mass with associated vascularity at the 7 o'clock position 1 cm from the nipple. It measures 10 x 7 x 5 mm. This correlates well with the mammographic finding. Evaluation of the left axilla demonstrates no suspicious lymphadenopathy. IMPRESSION: 1. Suspicious left breast mass at the 7 o'clock position. Recommendation is for ultrasound-guided biopsy. 2. No suspicious left axillary lymphadenopathy. 3. Two groups of indeterminate calcifications involving the upper inner and upper outer right breast. Each group spans up to 6 cm in the AP dimension. Recommendation is for stereotactic biopsy of both groups. RECOMMENDATION: Ultrasound-guided biopsy of the left breast and two-area stereotactic guided biopsy of the right breast. I have discussed the findings and recommendations with the patient. If applicable, a reminder letter will be sent to the patient regarding the next appointment. BI-RADS CATEGORY  4: Suspicious. Electronically Signed: By: Kristopher Oppenheim M.D. On: 12/02/2020 13:01   MM CLIP PLACEMENT LEFT  Result Date: 12/11/2020 CLINICAL DATA:  87 year old female status post ultrasound-guided biopsy of the left breast and stereotactic guided biopsy of the right breast. EXAM: DIAGNOSTIC BILATERAL MAMMOGRAM POST ultrasound and stereotactic BIOPSY COMPARISON:  Previous exam(s). FINDINGS: Mammographic images were obtained following ultrasound and stereotactic guided biopsy of the bilateral breasts. Both post biopsy marking clips are in their expected positions. IMPRESSION: Appropriate positioning of the ribbon shaped clip in the central left breast an X shaped clip in the upper inner right breast. Final Assessment: Post Procedure Mammograms for Marker Placement Electronically Signed   By: Kristopher Oppenheim M.D.   On: 12/11/2020 12:50   MM CLIP PLACEMENT RIGHT  Result Date:  12/11/2020 CLINICAL DATA:  87 year old female status post ultrasound-guided biopsy of the left breast and stereotactic guided biopsy of the right breast. EXAM: DIAGNOSTIC BILATERAL MAMMOGRAM POST ultrasound and stereotactic BIOPSY COMPARISON:  Previous exam(s). FINDINGS: Mammographic images were obtained following ultrasound and stereotactic guided biopsy of the bilateral breasts. Both post biopsy marking clips are in their expected positions. IMPRESSION: Appropriate positioning of the ribbon shaped clip in the central left breast an X shaped clip in the upper inner right breast. Final Assessment: Post Procedure Mammograms for Marker Placement Electronically Signed   By: Kristopher Oppenheim M.D.   On: 12/11/2020 12:50   [place] LT  BREAST BX W LOC DEV 1ST LESION IMG BX SPEC US GUIDE  Addendum Date: 12/13/2020   ADDENDUM REPORT: 12/12/2020 14:00 ADDENDUM: Pathology revealed GRADE II INVASIVE DUCTAL CARCINOMA of the LEFT breast, 7 o'clock, 1cmfn. This was found to be concordant by Dr. Kristopher Oppenheim. Pathology revealed FAT NECROSIS AND DYSTROPHIC CALCIFICATIONS- NO MALIGNANCY IDENTIFIED of the RIGHT breast, upper inner quadrant. This was found to be concordant by Dr. Kristopher Oppenheim. Pathology results were discussed with the patient by telephone. The patient reported doing well after the biopsies with tenderness at the sites. Post biopsy instructions and care were reviewed and questions were answered. The patient was encouraged to call [facility] for any additional concerns. The patient was referred to [facility] on December 18, 2020. Pathology results reported by Stacie Acres RN on 12/12/2020. Electronically Signed   By: Kristopher Oppenheim M.D.   On: 12/12/2020 14:00   Result Date: 12/13/2020 CLINICAL DATA:  87 year old female with a suspicious left breast mass and indeterminate right breast calcifications. EXAM: ULTRASOUND GUIDED  LEFT BREAST CORE NEEDLE BIOPSY STEREOTACTIC GUIDED RIGHT BREAST CORE NEEDLE BIOPSY COMPARISON:  Previous exam(s). PROCEDURE: I met with the patient and we discussed the procedure of ultrasound-guided biopsy, including benefits and alternatives. We discussed the high likelihood of a successful procedure. We discussed the risks of the procedure, including infection, bleeding, tissue injury, clip migration, and inadequate sampling. Informed written consent was given. The usual time-out protocol was performed immediately prior to the procedure. Lesion quadrant: Lower inner quadrant Using sterile technique and 1% Lidocaine as local anesthetic, under direct ultrasound visualization, a 14 gauge spring-loaded device was used to perform biopsy of a mass at the 7 o'clock position 1 cm from the nipple using a inferior approach. At the conclusion of the procedure a ribbon shaped tissue marker clip was deployed into the biopsy cavity. Lesion quadrant: Upper inner quadrant Using sterile technique and 1% Lidocaine as local anesthetic, under stereotactic guidance, a 9 gauge vacuum assisted device was used to perform core needle biopsy of calcifications in the upper inner quadrant of the right breast using a superior approach. Specimen radiograph was performed showing multiple coarse calcifications. Specimens with calcifications are identified for pathology. At the conclusion of the procedure, and X shaped tissue marker clip was deployed into the biopsy cavity. Additional stereotactic biopsy of calcifications in the upper outer right breast could not be safely performed despite attempting it from multiple directions. The calcifications were too close to the skin to be safely biopsied. Follow up 2 view mammogram was performed and dictated separately. IMPRESSION: Ultrasound guided biopsy of of a left breast mass and stereotactic guided biopsy of right breast calcifications. No apparent complications. Electronically Signed: By: Kristopher Oppenheim M.D. On: 12/11/2020 12:49   MM RT BREAST BX W LOC DEV 1ST LESION IMAGE BX SPEC STEREO GUIDE  Addendum Date: 12/13/2020   ADDENDUM REPORT: 12/12/2020 14:00 ADDENDUM: Pathology revealed GRADE II INVASIVE DUCTAL CARCINOMA of the LEFT breast, 7 o'clock, 1cmfn. This was found to be concordant by Dr. Kristopher Oppenheim. Pathology revealed FAT NECROSIS AND DYSTROPHIC CALCIFICATIONS- NO MALIGNANCY IDENTIFIED of the RIGHT breast, upper inner quadrant. This was found to be concordant by Dr. Kristopher Oppenheim. Pathology results were discussed with the patient by telephone. The patient reported doing well after the biopsies with tenderness at the sites. Post biopsy instructions and care were reviewed and questions were answered. The patient was encouraged  to call [facility] for any additional concerns. The patient was referred to [facility] on December 18, 2020. Pathology results reported by Stacie Acres RN on 12/12/2020. Electronically Signed   By: Kristopher Oppenheim M.D.   On: 12/12/2020 14:00   Result Date: 12/13/2020 CLINICAL DATA:  87 year old female with a suspicious left breast mass and indeterminate right breast calcifications. EXAM: ULTRASOUND GUIDED LEFT BREAST CORE NEEDLE BIOPSY STEREOTACTIC GUIDED RIGHT BREAST CORE NEEDLE BIOPSY COMPARISON:  Previous exam(s). PROCEDURE: I met with the patient and we discussed the procedure of ultrasound-guided biopsy, including benefits and alternatives. We discussed the high likelihood of a successful procedure. We discussed the risks of the procedure, including infection, bleeding, tissue injury, clip migration, and inadequate sampling. Informed written consent was given. The usual time-out protocol was performed immediately prior to the procedure. Lesion quadrant: Lower inner quadrant Using sterile technique and 1% Lidocaine as local anesthetic, under direct ultrasound visualization,  a 14 gauge spring-loaded device was used to perform biopsy of a mass at the 7 o'clock position 1 cm from the nipple using a inferior approach. At the conclusion of the procedure a ribbon shaped tissue marker clip was deployed into the biopsy cavity. Lesion quadrant: Upper inner quadrant Using sterile technique and 1% Lidocaine as local anesthetic, under stereotactic guidance, a 9 gauge vacuum assisted device was used to perform core needle biopsy of calcifications in the upper inner quadrant of the right breast using a superior approach. Specimen radiograph was performed showing multiple coarse calcifications. Specimens with calcifications are identified for pathology. At the conclusion of the procedure, and X shaped tissue marker clip was deployed into the biopsy cavity. Additional stereotactic biopsy of calcifications in the upper outer right breast could not be safely performed despite attempting it from multiple directions. The calcifications were too close to the skin to be safely biopsied. Follow up 2 view mammogram was performed and dictated separately. IMPRESSION: Ultrasound guided biopsy of of a left breast mass and stereotactic guided biopsy of right breast calcifications. No apparent complications. Electronically Signed: By: Kristopher Oppenheim M.D. On: 12/11/2020 12:49     ELIGIBLE FOR AVAILABLE RESEARCH PROTOCOL: no  ASSESSMENT: 87 y.o. [place] woman status post left breast lower inner quadrant biopsy 12/11/2020 for a clinicalT1b N0, stage IA invasive ductal carcinoma, grade 2, estrogen and progesterone receptor positive, HER-2 not amplified, with an MIB-1 of 15%  (1) definitive surgery pending  (2) consider adjuvant radiation depending on final pathology report  (3) to start anastrozole at the completion of local treatment  PLAN: I met today with Desirai to review her new diagnosis. Specifically we discussed the biology of her breast cancer, its diagnosis, staging, treatment  options and  prognosis. We first reviewed the fact that cancer is not one disease but more than 100 different diseases and that it is important to keep them separate-- otherwise when friends and relatives discuss their own cancer experiences with [place] confusion can result. Similarly we explained that if breast cancer spreads to the bone or liver, the patient would not have bone cancer or liver cancer, but breast cancer in the bone and breast cancer in the liver: one cancer in three places-- not 3 different cancers which otherwise would have to be treated in 3 different ways.  We discussed the difference between local and systemic therapy. In terms of loco-regional treatment, lumpectomy plus radiation is equivalent to mastectomy as far as survival  is concerned. For this reason, and because the cosmetic results are generally superior, we recommend breast conserving surgery.   We then discussed the rationale for systemic therapy. There is some risk that this cancer may have already spread to other parts of her body. Patients frequently ask at this point about bone scans, CAT scans and PET scans to find out if they have occult breast cancer somewhere else. The problem is that in early stage disease we are much more likely to find false positives then true cancers and this would expose the patient to unnecessary procedures as well as unnecessary radiation. Scans cannot answer the question the patient really would like to know, which is whether she has microscopic disease elsewhere in her body. For those reasons we do not recommend them.  Of course we would proceed to aggressive evaluation of any symptoms that might suggest metastatic disease, but that is not the case here.  Next we went over the options for systemic therapy which are anti-estrogens, anti-HER-2 immunotherapy, and chemotherapy. Prue does not meet criteria for anti-HER-2 immunotherapy. She is a good candidate for anti-estrogens.  The question of  chemotherapy is more complicated. Chemotherapy is most effective in rapidly growing, aggressive tumors. It is much less effective in cancers like Marissia 's.  Given that, as well as the very early stage and the patient's advanced age, we opted against sending an Oncotype.  Systemic therapy will be antiestrogens only.  Sanaai has a good understanding of the overall plan. She agrees with it. She knows the goal of treatment in her case is cure. She will call with any problems that may develop before her next visit here.  Total encounter time 65 minutes.Sarajane Jews C. Aubrea Meixner, MD 12/18/2020 9:15 AM Medical Oncology and Hematology [facility] [place], [place] [address] Tel. [phone]    Fax. [phone]   This document serves as a record of services personally performed by Lurline Del, MD. It was created on his behalf by Wilburn Mylar, a trained medical scribe. The creation of this record is based on the scribe's personal observations and the provider's statements to them.   I, Lurline Del MD, have reviewed the above documentation for accuracy and completeness, and I agree with the above.    *Total Encounter Time as defined by the Centers for Medicare and Medicaid Services includes, in addition to the face-to-face time of a patient visit (documented in the note above) non-face-to-face time: obtaining and reviewing outside history, ordering and reviewing medications, tests or procedures, care coordination (communications with other health care professionals or caregivers) and documentation in the medical record.

## 2020-12-18 ENCOUNTER — Encounter: Payer: Self-pay | Admitting: *Deleted

## 2020-12-18 ENCOUNTER — Other Ambulatory Visit: Payer: Self-pay | Admitting: Surgery

## 2020-12-18 ENCOUNTER — Other Ambulatory Visit: Payer: Self-pay

## 2020-12-18 ENCOUNTER — Inpatient Hospital Stay: Payer: Medicare Other

## 2020-12-18 ENCOUNTER — Ambulatory Visit
Admission: RE | Admit: 2020-12-18 | Discharge: 2020-12-18 | Disposition: A | Discharge status: Home / Self Care | Payer: Medicare Other | Source: Ambulatory Visit | Attending: Radiation Oncology | Admitting: Radiation Oncology

## 2020-12-18 ENCOUNTER — Encounter: Payer: Self-pay | Admitting: Licensed Clinical Social Worker

## 2020-12-18 ENCOUNTER — Encounter: Payer: Self-pay | Admitting: Oncology

## 2020-12-18 ENCOUNTER — Ambulatory Visit: Payer: Medicare Other | Admitting: Physical Therapy

## 2020-12-18 ENCOUNTER — Inpatient Hospital Stay: Payer: Medicare Other | Attending: Oncology | Admitting: Oncology

## 2020-12-18 VITALS — BP 145/70 | HR 66 | Temp 97.7°F | Resp 20 | Ht 65.0 in | Wt 215.4 lb

## 2020-12-18 DIAGNOSIS — C50312 Malignant neoplasm of lower-inner quadrant of left female breast: Secondary | ICD-10-CM

## 2020-12-18 DIAGNOSIS — Z96641 Presence of right artificial hip joint: Secondary | ICD-10-CM | POA: Diagnosis not present

## 2020-12-18 DIAGNOSIS — Z800 Family history of malignant neoplasm of digestive organs: Secondary | ICD-10-CM | POA: Diagnosis not present

## 2020-12-18 DIAGNOSIS — Z780 Asymptomatic menopausal state: Secondary | ICD-10-CM | POA: Diagnosis not present

## 2020-12-18 DIAGNOSIS — Z853 Personal history of malignant neoplasm of breast: Secondary | ICD-10-CM

## 2020-12-18 DIAGNOSIS — Z170 Estrogen receptor positive status [ER+]: Secondary | ICD-10-CM

## 2020-12-18 DIAGNOSIS — Z96653 Presence of artificial knee joint, bilateral: Secondary | ICD-10-CM | POA: Insufficient documentation

## 2020-12-18 DIAGNOSIS — Z87891 Personal history of nicotine dependence: Secondary | ICD-10-CM | POA: Diagnosis not present

## 2020-12-18 DIAGNOSIS — C50912 Malignant neoplasm of unspecified site of left female breast: Secondary | ICD-10-CM | POA: Diagnosis not present

## 2020-12-18 LAB — CMP (CANCER CENTER ONLY)
ALT: 19 U/L (ref 0–44)
AST: 18 U/L (ref 15–41)
Albumin: 3.9 g/dL (ref 3.5–5.0)
Alkaline Phosphatase: 105 U/L (ref 38–126)
Anion gap: 13 (ref 5–15)
BUN: 14 mg/dL (ref 8–23)
CO2: 25 mmol/L (ref 22–32)
Calcium: 8.7 mg/dL — ABNORMAL LOW (ref 8.9–10.3)
Chloride: 100 mmol/L (ref 98–111)
Creatinine: 0.75 mg/dL (ref 0.44–1.00)
GFR, Estimated: >60 mL/min (ref >60)
Glucose, Bld: 134 mg/dL — ABNORMAL HIGH (ref 70–99)
Potassium: 4.7 mmol/L (ref 3.5–5.1)
Sodium: 138 mmol/L (ref 135–145)
Total Bilirubin: 0.4 mg/dL (ref 0.3–1.2)
Total Protein: 6.8 g/dL (ref 6.5–8.1)

## 2020-12-18 LAB — CBC WITH DIFFERENTIAL (CANCER CENTER ONLY)
Abs Immature Granulocytes: 0.02 10*3/uL (ref 0.00–0.07)
Basophils Absolute: 0 10*3/uL (ref 0.0–0.1)
Basophils Relative: 1 %
Eosinophils Absolute: 0.1 10*3/uL (ref 0.0–0.5)
Eosinophils Relative: 3 %
HCT: 41.6 % (ref 36.0–46.0)
Hemoglobin: 13.7 g/dL (ref 12.0–15.0)
Immature Granulocytes: 0 %
Lymphocytes Relative: 26 %
Lymphs Abs: 1.2 10*3/uL (ref 0.7–4.0)
MCH: 32 pg (ref 26.0–34.0)
MCHC: 32.9 g/dL (ref 30.0–36.0)
MCV: 97.2 fL (ref 80.0–100.0)
Monocytes Absolute: 0.6 10*3/uL (ref 0.1–1.0)
Monocytes Relative: 12 %
Neutro Abs: 2.8 10*3/uL (ref 1.7–7.7)
Neutrophils Relative %: 58 %
Platelet Count: 213 10*3/uL (ref 150–400)
RBC: 4.28 MIL/uL (ref 3.87–5.11)
RDW: 12.3 % (ref 11.5–15.5)
WBC Count: 4.8 10*3/uL (ref 4.0–10.5)
nRBC: 0 % (ref 0.0–0.2)

## 2020-12-18 LAB — GENETIC SCREENING ORDER

## 2020-12-18 NOTE — Progress Notes (Signed)
[facility]  Initial Assessment   Catherine Coleman is a 87 y.o. year old female presenting alone. Clinical Social Work was referred by [facility] for assessment of psychosocial needs.   SDOH (Social Determinants of Health) assessments performed: Yes SDOH Interventions   Flowsheet Row Most Recent Value  SDOH Interventions   Food Insecurity Interventions Intervention Not Indicated  Financial Strain Interventions Intervention Not Indicated  Housing Interventions Intervention Not Indicated  Transportation Interventions Intervention Not Indicated      Distress Screen completed: Yes ONCBCN DISTRESS SCREENING 12/18/2020  Screening Type Initial Screening  Distress experienced in past week (1-10) 1  Emotional problem type Adjusting to illness  Information Concerns Type Lack of info about diagnosis;Lack of info about treatment;Lack of info about complementary therapy choices;Lack of info about maintaining fitness      Family/Social Information:  . Housing Arrangement: patient lives at the [employer] (community with various services, amenities, and activities) . Family members/support persons in your life? Four daughters, community . Transportation concerns: no  . Employment: Retired Engineer, water. Income source: Conservation officer, historic buildings and savings . Financial concerns: No o Type of concern: None . Food access concerns: no . Services Currently in place:  n/a  Coping/ Adjustment to diagnosis: . Patient understands treatment plan and what happens next? yes . Concerns about diagnosis and/or treatment: I'm not especially worried about anything . Patient reported stressors: Adjusting to my illness . Patient enjoys weaving/knitting, herb garden . Current coping skills/ strengths: Average or above average intelligence, Communication skills, Financial means, Motivation for treatment/growth and Special hobby/interest    SUMMARY: Current SDOH Barriers:  . No significant  SDOH barriers noted today  Clinical Social Work Clinical Goal(s):  Marland Kitchen No clinical SW goals at this time  Interventions: . Discussed common feeling and emotions when being diagnosed with cancer, and the importance of support during treatment . Informed patient of the support team roles and support services at [facility] . Provided CSW contact information and encouraged patient to call with any questions or concerns   Follow Up Plan: Patient will contact CSW with any support or resource needs Patient verbalizes understanding of plan: Yes    Christeen Douglas , LCSW

## 2020-12-18 NOTE — Progress Notes (Signed)
Radiation Oncology         [phone] ________________________________  Name: Catherine Coleman        MRN: [id number]  Date of Service: 12/18/2020 DOB: 02-22-1932  [id number], Catherine Levan, MD  [name], MD     REFERRING PHYSICIAN: [name], MD   DIAGNOSIS: The encounter diagnosis was Malignant neoplasm of lower-inner quadrant of left breast in female, estrogen receptor positive ([place]).   HISTORY OF PRESENT ILLNESS: Catherine Coleman is a 87 y.o. female seen in the multidisciplinary breast clinic for a new diagnosis of left breast cancer. The patient was noted to have a screening detected mass in the left breast as well as right calcifications.  Diagnostic work-up continued and her right calcifications were biopsied and consistent with fat necrosis and calcifications on 12/11/2020.  The persistent finding in the left breast however was at 7 o'clock position measuring 1 cm, her axilla was negative for adenopathy and biopsies also on 12/11/2020 showed a grade 2 invasive ductal carcinoma that was ER/PR positive, HER-2 was negative, Ki-67 was 15%.  She is seen today to discuss treatment recommendations of her cancer.  PREVIOUS RADIATION THERAPY: No   PAST MEDICAL HISTORY:  Past Medical History:  Diagnosis Date  . Arthritis   . Atrophic rhinitis 04/21/2016  . Constipation   . Decreased hearing   . Diverticulitis   . Dry eyes   . Fatigue   . Floaters in visual field   . Gall bladder disease   . GERD (gastroesophageal reflux disease)   . Glaucoma   . Hiatal hernia with gastroesophageal reflux   . History of blood transfusion    for Knee replacement  . Hyperlipidemia, mild 04/02/2016  . Hypertension   . Hypothyroidism   . Joint pain   . Migraine aura without headache   . Muscle pain   . Muscle stiffness   . Nasal congestion   . Obesity 04/02/2016  . Osteoarthritis   . Parotiditis 04/21/2016  . Vitamin D deficiency 04/02/2016       PAST SURGICAL HISTORY: Past Surgical  History:  Procedure Laterality Date  . APPENDECTOMY    . cataract surgery  2008  . CHOLECYSTECTOMY  2010  . JOINT REPLACEMENT Left    2012  . MASTOIDECTOMY    . POSTERIOR CERVICAL FUSION/FORAMINOTOMY N/A 04/27/2017   Procedure: CERVICAL FIVE-SIX  OPEN REDUCTION OF FRACTURE, CERVICAL THREE-SEVEN  DORSAL FIXATION AND FUSION;  Surgeon: Ditty, Kevan Ny, MD;  Location: [place];  Service: Neurosurgery;  Laterality: N/A;  . SHOULDER ARTHROSCOPY Bilateral prior to 2010  . TONSILLECTOMY    . TOTAL HIP ARTHROPLASTY Right 03/22/2015   Procedure: RIGHT TOTAL HIP ARTHROPLASTY ANTERIOR APPROACH;  Surgeon: Dorna Leitz, MD;  Location: [place];  Service: Orthopedics;  Laterality: Right;  . TOTAL KNEE ARTHROPLASTY Bilateral 2007  . TUBAL LIGATION       FAMILY HISTORY:  Family History  Problem Relation Age of Onset  . Dementia Mother   . Hypertension Mother   . Obesity Mother   . Heart disease Father   . Hypertension Father   . Stroke Father   . Diabetes Father   . Hyperlipidemia Father   . Alcoholism Father   . Obesity Father   . Arthritis Daughter   . Cancer Maternal Grandfather        colon cancer     SOCIAL HISTORY:  reports that she has quit smoking. She has never used smokeless tobacco. She reports current alcohol use. She reports that  she does not use drugs.  The patient is widowed and resides in [place]. 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    ALLERGIES: Other   MEDICATIONS:  Current Outpatient Medications  Medication Sig Dispense Refill  . amoxicillin-clavulanate (AUGMENTIN) 875-125 MG tablet Take 1 tablet by mouth 2 (two) times daily. 20 tablet 0  . amoxicillin-clavulanate (AUGMENTIN) 875-125 MG tablet TAKE 1 TABLET BY MOUTH 2 (TWO) TIMES DAILY. 20 tablet 0  . Biotin 10000 MCG TABS Take by mouth.    . brimonidine (ALPHAGAN) 0.2 % ophthalmic solution Place 1 drop into both eyes 2 (two) times daily. 5 mL 12  . famotidine  (PEPCID) 40 MG tablet TAKE 1 TABLET BY MOUTH AT  BEDTIME AS NEEDED 90 tablet 3  . levothyroxine (SYNTHROID) 125 MCG tablet TAKE 1 TABLET BY MOUTH  DAILY 90 tablet 3  . losartan (COZAAR) 50 MG tablet TAKE 1 TABLET BY MOUTH  DAILY 90 tablet 3  . mirabegron ER (MYRBETRIQ) 50 MG TB24 tablet Take by mouth.    . Multiple Vitamins-Minerals (MULTIVITAMIN WITH MINERALS) tablet Take 1 tablet by mouth daily.    Marland Kitchen omeprazole (PRILOSEC) 20 MG capsule TAKE 1 CAPSULE BY MOUTH  DAILY 90 capsule 3  . polyethylene glycol (MIRALAX / GLYCOLAX) packet Take 17 g by mouth daily. 14 each 0   No current facility-administered medications for this encounter.     REVIEW OF SYSTEMS: On review of systems, the patient reports that she is doing well overall. She reports soreness of her breast since her biopsy. She has some difficulty with urinary incontinence and was waiting to try topical estrogen cream for this until she met with Dr. Jana Hakim. She does have some sinus congestion at times and joint aches in her right hip and ankle. A complete review of systems is obtained and is otherwise negative.     PHYSICAL EXAM:  Wt Readings from Last 3 Encounters:  11/25/20 216 lb 3.2 oz (98.1 kg)  09/05/20 216 lb (98 kg)  07/16/20 213 lb (96.6 kg)   Temp Readings from Last 3 Encounters:  11/25/20 97.9 F (36.6 C) (Oral)  09/05/20 (!) 96.9 F (36.1 C) (Temporal)  07/16/20 97.7 F (36.5 C) (Oral)   BP Readings from Last 3 Encounters:  11/25/20 128/73  09/05/20 114/72  07/16/20 136/63   Pulse Readings from Last 3 Encounters:  11/25/20 73  09/05/20 87  07/16/20 72    In general this is a well appearing Caucasian female in no acute distress. She's alert and oriented x4 and appropriate throughout the examination. Cardiopulmonary assessment is negative for acute distress and she exhibits normal effort. Bilateral breast exam is deferred.    ECOG = 0  0 - Asymptomatic (Fully active, able to carry on all predisease  activities without restriction)  1 - Symptomatic but completely ambulatory (Restricted in physically strenuous activity but ambulatory and able to carry out work of a light or sedentary nature. For example, light housework, office work)  2 - Symptomatic, <50% in bed during the day (Ambulatory and capable of all self care but unable to carry out any work activities. Up and about more than 50% of waking hours)  3 - Symptomatic, >50% in bed, but not bedbound (Capable of only limited self-care, confined to bed or chair 50% or more of waking hours)  4 - Bedbound (Completely disabled. Cannot carry on any self-care. Totally confined to bed or chair)  5 - Death  Oken MM, Creech RH, Tormey DC, et al. [phone]). "Toxicity and response criteria of the [facility]". [place] Oncol. 5 (6): 649-55    LABORATORY DATA:  Lab Results  Component Value Date   WBC 4.9 10/09/2020   HGB 13.3 10/09/2020   HCT 40.2 10/09/2020   MCV 96.4 10/09/2020   PLT 216.0 10/09/2020   Lab Results  Component Value Date   NA 135 10/09/2020   K 4.9 10/09/2020   CL 99 10/09/2020   CO2 30 10/09/2020   Lab Results  Component Value Date   ALT 16 10/09/2020   AST 17 10/09/2020   ALKPHOS 90 10/09/2020   BILITOT 0.5 10/09/2020      RADIOGRAPHY: DG Ankle Complete Right  Result Date: 11/25/2020 CLINICAL DATA:  Right ankle pain, lateral swelling for 1 month EXAM: RIGHT ANKLE - COMPLETE 3+ VIEW COMPARISON:  11/15/2019 FINDINGS: Frontal, oblique, and lateral views of the right ankle are obtained. No acute fracture, subluxation, or dislocation. Stable small inferior calcaneal spur. Mild anterior soft tissue swelling. IMPRESSION: 1. Mild anterior soft tissue swelling.  No acute bony abnormality. Electronically Signed   By: Randa Ngo M.D.   On: 11/25/2020 22:00   MM Digital Diagnostic Bilat  Addendum Date: 12/13/2020   ADDENDUM REPORT: 12/13/2020 08:04 ADDENDUM: This is an addendum to the report  originally dictated on 12/02/2020. Within the findings section, the diffuse round and punctate calcifications are within the medial and lateral RIGHT breast. Electronically Signed   By: Kristopher Oppenheim M.D.   On: 12/13/2020 08:04   Result Date: 12/13/2020 CLINICAL DATA:  87 year old female recalled from screening mammogram dated 11/13/2020 for right breast calcifications and possible left breast mass. EXAM: DIGITAL DIAGNOSTIC BILATERAL MAMMOGRAM WITH CAD; ULTRASOUND LEFT BREAST LIMITED TECHNIQUE: Bilateral digital diagnostic mammography was performed. Mammographic images were processed with CAD.; Targeted ultrasound examination of the left breast was performed COMPARISON:  Previous exam(s). ACR Breast Density Category b: There are scattered areas of fibroglandular density. FINDINGS: There is a persistent irregular hyperdense mass in the periareolar left breast. Further evaluation with ultrasound was performed. Additionally, diffuse round and punctate calcifications in the lateral and medial left breast are new from prior comparison mammograms. Each group spans up to 6 cm in the AP dimension. They demonstrate a somewhat segmental distribution. Targeted ultrasound is performed, showing an irregular hypoechoic mass with associated vascularity at the 7 o'clock position 1 cm from the nipple. It measures 10 x 7 x 5 mm. This correlates well with the mammographic finding. Evaluation of the left axilla demonstrates no suspicious lymphadenopathy. IMPRESSION: 1. Suspicious left breast mass at the 7 o'clock position. Recommendation is for ultrasound-guided biopsy. 2. No suspicious left axillary lymphadenopathy. 3. Two groups of indeterminate calcifications involving the upper inner and upper outer right breast. Each group spans up to 6 cm in the AP dimension. Recommendation is for stereotactic biopsy of both groups. RECOMMENDATION: Ultrasound-guided biopsy of the left breast and two-area stereotactic guided biopsy of the right  breast. I have discussed the findings and recommendations with the patient. If applicable, a reminder letter will be sent to the patient regarding the next appointment. BI-RADS CATEGORY  4: Suspicious. Electronically Signed: By: Kristopher Oppenheim M.D. On: 12/02/2020 13:01   US BREAST LTD UNI LEFT INC AXILLA  Addendum Date: 12/13/2020   ADDENDUM REPORT: 12/13/2020 08:04 ADDENDUM: This is an addendum to the report originally dictated on 12/02/2020. Within the findings section, the diffuse round and punctate calcifications are within  the medial and lateral RIGHT breast. Electronically Signed   By: Kristopher Oppenheim M.D.   On: 12/13/2020 08:04   Result Date: 12/13/2020 CLINICAL DATA:  87 year old female recalled from screening mammogram dated 11/13/2020 for right breast calcifications and possible left breast mass. EXAM: DIGITAL DIAGNOSTIC BILATERAL MAMMOGRAM WITH CAD; ULTRASOUND LEFT BREAST LIMITED TECHNIQUE: Bilateral digital diagnostic mammography was performed. Mammographic images were processed with CAD.; Targeted ultrasound examination of the left breast was performed COMPARISON:  Previous exam(s). ACR Breast Density Category b: There are scattered areas of fibroglandular density. FINDINGS: There is a persistent irregular hyperdense mass in the periareolar left breast. Further evaluation with ultrasound was performed. Additionally, diffuse round and punctate calcifications in the lateral and medial left breast are new from prior comparison mammograms. Each group spans up to 6 cm in the AP dimension. They demonstrate a somewhat segmental distribution. Targeted ultrasound is performed, showing an irregular hypoechoic mass with associated vascularity at the 7 o'clock position 1 cm from the nipple. It measures 10 x 7 x 5 mm. This correlates well with the mammographic finding. Evaluation of the left axilla demonstrates no suspicious lymphadenopathy. IMPRESSION: 1. Suspicious left breast mass at the 7 o'clock position.  Recommendation is for ultrasound-guided biopsy. 2. No suspicious left axillary lymphadenopathy. 3. Two groups of indeterminate calcifications involving the upper inner and upper outer right breast. Each group spans up to 6 cm in the AP dimension. Recommendation is for stereotactic biopsy of both groups. RECOMMENDATION: Ultrasound-guided biopsy of the left breast and two-area stereotactic guided biopsy of the right breast. I have discussed the findings and recommendations with the patient. If applicable, a reminder letter will be sent to the patient regarding the next appointment. BI-RADS CATEGORY  4: Suspicious. Electronically Signed: By: Kristopher Oppenheim M.D. On: 12/02/2020 13:01   MM CLIP PLACEMENT LEFT  Result Date: 12/11/2020 CLINICAL DATA:  87 year old female status post ultrasound-guided biopsy of the left breast and stereotactic guided biopsy of the right breast. EXAM: DIAGNOSTIC BILATERAL MAMMOGRAM POST ultrasound and stereotactic BIOPSY COMPARISON:  Previous exam(s). FINDINGS: Mammographic images were obtained following ultrasound and stereotactic guided biopsy of the bilateral breasts. Both post biopsy marking clips are in their expected positions. IMPRESSION: Appropriate positioning of the ribbon shaped clip in the central left breast an X shaped clip in the upper inner right breast. Final Assessment: Post Procedure Mammograms for Marker Placement Electronically Signed   By: Kristopher Oppenheim M.D.   On: 12/11/2020 12:50   MM CLIP PLACEMENT RIGHT  Result Date: 12/11/2020 CLINICAL DATA:  87 year old female status post ultrasound-guided biopsy of the left breast and stereotactic guided biopsy of the right breast. EXAM: DIAGNOSTIC BILATERAL MAMMOGRAM POST ultrasound and stereotactic BIOPSY COMPARISON:  Previous exam(s). FINDINGS: Mammographic images were obtained following ultrasound and stereotactic guided biopsy of the bilateral breasts. Both post biopsy marking clips are in their expected positions.  IMPRESSION: Appropriate positioning of the ribbon shaped clip in the central left breast an X shaped clip in the upper inner right breast. Final Assessment: Post Procedure Mammograms for Marker Placement Electronically Signed   By: Kristopher Oppenheim M.D.   On: 12/11/2020 12:50   [place] LT BREAST BX W LOC DEV 1ST LESION IMG BX SPEC US GUIDE  Addendum Date: 12/13/2020   ADDENDUM REPORT: 12/12/2020 14:00 ADDENDUM: Pathology revealed GRADE II INVASIVE DUCTAL CARCINOMA of the LEFT breast, 7 o'clock, 1cmfn. This was found to be concordant by Dr. Kristopher Oppenheim. Pathology revealed FAT NECROSIS AND DYSTROPHIC CALCIFICATIONS- NO MALIGNANCY IDENTIFIED of the  RIGHT breast, upper inner quadrant. This was found to be concordant by Dr. Kristopher Oppenheim. Pathology results were discussed with the patient by telephone. The patient reported doing well after the biopsies with tenderness at the sites. Post biopsy instructions and care were reviewed and questions were answered. The patient was encouraged to call [facility] for any additional concerns. The patient was referred to [facility] on December 18, 2020. Pathology results reported by Stacie Acres RN on 12/12/2020. Electronically Signed   By: Kristopher Oppenheim M.D.   On: 12/12/2020 14:00   Result Date: 12/13/2020 CLINICAL DATA:  87 year old female with a suspicious left breast mass and indeterminate right breast calcifications. EXAM: ULTRASOUND GUIDED LEFT BREAST CORE NEEDLE BIOPSY STEREOTACTIC GUIDED RIGHT BREAST CORE NEEDLE BIOPSY COMPARISON:  Previous exam(s). PROCEDURE: I met with the patient and we discussed the procedure of ultrasound-guided biopsy, including benefits and alternatives. We discussed the high likelihood of a successful procedure. We discussed the risks of the procedure, including infection, bleeding, tissue injury, clip migration, and inadequate sampling. Informed written  consent was given. The usual time-out protocol was performed immediately prior to the procedure. Lesion quadrant: Lower inner quadrant Using sterile technique and 1% Lidocaine as local anesthetic, under direct ultrasound visualization, a 14 gauge spring-loaded device was used to perform biopsy of a mass at the 7 o'clock position 1 cm from the nipple using a inferior approach. At the conclusion of the procedure a ribbon shaped tissue marker clip was deployed into the biopsy cavity. Lesion quadrant: Upper inner quadrant Using sterile technique and 1% Lidocaine as local anesthetic, under stereotactic guidance, a 9 gauge vacuum assisted device was used to perform core needle biopsy of calcifications in the upper inner quadrant of the right breast using a superior approach. Specimen radiograph was performed showing multiple coarse calcifications. Specimens with calcifications are identified for pathology. At the conclusion of the procedure, and X shaped tissue marker clip was deployed into the biopsy cavity. Additional stereotactic biopsy of calcifications in the upper outer right breast could not be safely performed despite attempting it from multiple directions. The calcifications were too close to the skin to be safely biopsied. Follow up 2 view mammogram was performed and dictated separately. IMPRESSION: Ultrasound guided biopsy of of a left breast mass and stereotactic guided biopsy of right breast calcifications. No apparent complications. Electronically Signed: By: Kristopher Oppenheim M.D. On: 12/11/2020 12:49   MM RT BREAST BX W LOC DEV 1ST LESION IMAGE BX SPEC STEREO GUIDE  Addendum Date: 12/13/2020   ADDENDUM REPORT: 12/12/2020 14:00 ADDENDUM: Pathology revealed GRADE II INVASIVE DUCTAL CARCINOMA of the LEFT breast, 7 o'clock, 1cmfn. This was found to be concordant by Dr. Kristopher Oppenheim. Pathology revealed FAT NECROSIS AND DYSTROPHIC CALCIFICATIONS- NO MALIGNANCY IDENTIFIED of the RIGHT breast, upper inner  quadrant. This was found to be concordant by Dr. Kristopher Oppenheim. Pathology results were discussed with the patient by telephone. The patient reported doing well after the biopsies with tenderness at the sites. Post biopsy instructions and care were reviewed and questions were answered. The patient was encouraged to call [facility] for any additional concerns. The patient was referred to [facility] on December 18, 2020. Pathology results reported by Stacie Acres RN on 12/12/2020. Electronically Signed   By: Kristopher Oppenheim M.D.   On: 12/12/2020 14:00   Result  Date: 12/13/2020 CLINICAL DATA:  87 year old female with a suspicious left breast mass and indeterminate right breast calcifications. EXAM: ULTRASOUND GUIDED LEFT BREAST CORE NEEDLE BIOPSY STEREOTACTIC GUIDED RIGHT BREAST CORE NEEDLE BIOPSY COMPARISON:  Previous exam(s). PROCEDURE: I met with the patient and we discussed the procedure of ultrasound-guided biopsy, including benefits and alternatives. We discussed the high likelihood of a successful procedure. We discussed the risks of the procedure, including infection, bleeding, tissue injury, clip migration, and inadequate sampling. Informed written consent was given. The usual time-out protocol was performed immediately prior to the procedure. Lesion quadrant: Lower inner quadrant Using sterile technique and 1% Lidocaine as local anesthetic, under direct ultrasound visualization, a 14 gauge spring-loaded device was used to perform biopsy of a mass at the 7 o'clock position 1 cm from the nipple using a inferior approach. At the conclusion of the procedure a ribbon shaped tissue marker clip was deployed into the biopsy cavity. Lesion quadrant: Upper inner quadrant Using sterile technique and 1% Lidocaine as local anesthetic, under stereotactic guidance, a 9 gauge vacuum assisted device was used to perform core  needle biopsy of calcifications in the upper inner quadrant of the right breast using a superior approach. Specimen radiograph was performed showing multiple coarse calcifications. Specimens with calcifications are identified for pathology. At the conclusion of the procedure, and X shaped tissue marker clip was deployed into the biopsy cavity. Additional stereotactic biopsy of calcifications in the upper outer right breast could not be safely performed despite attempting it from multiple directions. The calcifications were too close to the skin to be safely biopsied. Follow up 2 view mammogram was performed and dictated separately. IMPRESSION: Ultrasound guided biopsy of of a left breast mass and stereotactic guided biopsy of right breast calcifications. No apparent complications. Electronically Signed: By: Kristopher Oppenheim M.D. On: 12/11/2020 12:49       IMPRESSION/PLAN: 1. Stage IA, T1bN0M0 grade 2, ER/PR positive invasive ductal carcinoma of the left breast. Dr. Lisbeth Renshaw discusses the pathology findings and reviews the nature of early stage left breast disease. The consensus from the breast conference includes breast conservation with lumpectomy.  She would not require any systemic chemotherapy per our discussion in conference this morning.  Dr. Lisbeth Renshaw discusses that we will follow-up with the results of her final pathology report to determine the benefits of external radiotherapy to the breast.  She is aware of the rationale for radiotherapy to reduce risks of local recurrence, and that Dr. Jana Hakim would also recommend that her course be followed by antiestrogen therapy. We discussed the risks, benefits, short, and long term effects of radiotherapy, as well as the curative intent, and the patient is contemplating therapy as she is aggressive minded in her approach to her care. Dr. Lisbeth Renshaw discusses the delivery and logistics of radiotherapy and anticipates a course of 4 weeks of radiotherapy to the left breast  should she undergo adjuvant radiotherapy. We will see her back a few weeks after surgery to discuss how she would like to proceed, and if so, would move forward with the simulation process and anticipate we starting radiotherapy about 4-6 weeks after surgery.     In a visit lasting 60 minutes, greater than 50% of the time was spent face to face reviewing her case, as well as in preparation of, discussing, and coordinating the patient's care.  The above documentation reflects my direct findings during this shared patient visit. Please see the separate note by Dr. Lisbeth Renshaw on this date for the remainder of the  patient's plan of care.    Carola Rhine, [facility]    **Disclaimer: This note was dictated with voice recognition software. Similar sounding words can inadvertently be transcribed and this note may contain transcription errors which may not have been corrected upon publication of note.**

## 2020-12-19 ENCOUNTER — Telehealth: Payer: Self-pay | Admitting: Oncology

## 2020-12-19 DIAGNOSIS — N390 Urinary tract infection, site not specified: Secondary | ICD-10-CM | POA: Diagnosis not present

## 2020-12-19 NOTE — Telephone Encounter (Signed)
Scheduled appt per 4/6 los. Pt aware.

## 2020-12-23 ENCOUNTER — Other Ambulatory Visit: Payer: Self-pay | Admitting: Surgery

## 2020-12-23 DIAGNOSIS — Z853 Personal history of malignant neoplasm of breast: Secondary | ICD-10-CM

## 2020-12-24 ENCOUNTER — Encounter: Payer: Self-pay | Admitting: Dietician

## 2020-12-24 NOTE — Progress Notes (Signed)
Nutrition  Patient identified after attending Breast Clinic on 12/18/20. Patient given nutrition packet with RD contact information by nurse navigator at that time.  Chart reviewed.   Patient with left breast cancer, estrogen receptor positive. She is scheduled for left breast lumpectomy with radioactive seeding with Dr. Ninfa Linden on 01/21/21.  Ht: 5'5" Wt: 215 lb 6.4 oz  BMI: 35.84  Patient is not currently at nutrition risk. Please consult RD if nutrition issues arise.  Lajuan Lines, [place], [place] Clinical Nutrition Cell [phone]

## 2020-12-26 ENCOUNTER — Encounter: Payer: Self-pay | Admitting: *Deleted

## 2020-12-26 ENCOUNTER — Telehealth: Payer: Self-pay | Admitting: *Deleted

## 2020-12-26 DIAGNOSIS — C50312 Malignant neoplasm of lower-inner quadrant of left female breast: Secondary | ICD-10-CM

## 2020-12-26 NOTE — Telephone Encounter (Signed)
Left vm regarding [facility] from 4.6.22. Contact information provided for questions or needs.

## 2021-01-06 ENCOUNTER — Ambulatory Visit: Payer: Medicare Other | Attending: Internal Medicine

## 2021-01-06 ENCOUNTER — Other Ambulatory Visit: Payer: Self-pay

## 2021-01-06 ENCOUNTER — Encounter: Payer: Self-pay | Admitting: Family Medicine

## 2021-01-06 ENCOUNTER — Ambulatory Visit (INDEPENDENT_AMBULATORY_CARE_PROVIDER_SITE_OTHER): Payer: Medicare Other | Admitting: Family Medicine

## 2021-01-06 ENCOUNTER — Other Ambulatory Visit (HOSPITAL_BASED_OUTPATIENT_CLINIC_OR_DEPARTMENT_OTHER): Payer: Self-pay

## 2021-01-06 VITALS — BP 122/76 | HR 90 | Temp 97.4°F | Resp 16 | Wt 216.4 lb

## 2021-01-06 DIAGNOSIS — N3941 Urge incontinence: Secondary | ICD-10-CM | POA: Diagnosis not present

## 2021-01-06 DIAGNOSIS — J329 Chronic sinusitis, unspecified: Secondary | ICD-10-CM

## 2021-01-06 DIAGNOSIS — M1990 Unspecified osteoarthritis, unspecified site: Secondary | ICD-10-CM

## 2021-01-06 DIAGNOSIS — E785 Hyperlipidemia, unspecified: Secondary | ICD-10-CM

## 2021-01-06 DIAGNOSIS — M545 Low back pain, unspecified: Secondary | ICD-10-CM

## 2021-01-06 DIAGNOSIS — R739 Hyperglycemia, unspecified: Secondary | ICD-10-CM | POA: Diagnosis not present

## 2021-01-06 DIAGNOSIS — R519 Headache, unspecified: Secondary | ICD-10-CM | POA: Diagnosis not present

## 2021-01-06 DIAGNOSIS — Z23 Encounter for immunization: Secondary | ICD-10-CM

## 2021-01-06 DIAGNOSIS — N390 Urinary tract infection, site not specified: Secondary | ICD-10-CM

## 2021-01-06 DIAGNOSIS — L578 Other skin changes due to chronic exposure to nonionizing radiation: Secondary | ICD-10-CM

## 2021-01-06 DIAGNOSIS — E559 Vitamin D deficiency, unspecified: Secondary | ICD-10-CM

## 2021-01-06 DIAGNOSIS — E038 Other specified hypothyroidism: Secondary | ICD-10-CM | POA: Diagnosis not present

## 2021-01-06 DIAGNOSIS — I10 Essential (primary) hypertension: Secondary | ICD-10-CM | POA: Diagnosis not present

## 2021-01-06 MED ORDER — PFIZER-BIONT COVID-19 VAC-TRIS 30 MCG/0.3ML IM SUSP
INTRAMUSCULAR | 0 refills | Status: DC
  Filled 2021-01-06: qty 0.3, 1d supply, fill #0

## 2021-01-06 MED ORDER — LEVOCETIRIZINE DIHYDROCHLORIDE 5 MG PO TABS: 5.0000 mg | ORAL_TABLET | Freq: Every evening | ORAL | 3 refills | Status: DC

## 2021-01-06 NOTE — Assessment & Plan Note (Signed)
Well controlled, no changes to meds. Encouraged heart healthy diet such as the DASH diet and exercise as tolerated.  °

## 2021-01-06 NOTE — Progress Notes (Signed)
Subjective:    Patient ID: Catherine Coleman, female    DOB: Jun 12, 1932, 87 y.o.   MRN: [id number]  Chief Complaint  Patient presents with  . Follow-up    Pt would like to ask pcp questions.    HPI Patient is in today for follow up on chronic medical concerns. No recent febrile illness or hospitalizations. No polyuria or polydipsia. She is still having headaches and they are more localized over left forehead and behind left eye. She has visual changes as well. Denies CP/palp/SOB/fevers/GI or GU c/o. Taking meds as prescribed  Past Medical History:  Diagnosis Date  . Arthritis   . Atrophic rhinitis 04/21/2016  . Breast cancer ([place])   . Constipation   . Decreased hearing   . Diverticulitis   . Dry eyes   . Fatigue   . Floaters in visual field   . Gall bladder disease   . GERD (gastroesophageal reflux disease)   . Glaucoma   . Hiatal hernia with gastroesophageal reflux   . History of blood transfusion    for Knee replacement  . Hyperlipidemia, mild 04/02/2016  . Hypertension   . Hypothyroidism   . Joint pain   . Migraine aura without headache   . Muscle pain   . Muscle stiffness   . Nasal congestion   . Obesity 04/02/2016  . Osteoarthritis   . Parotiditis 04/21/2016  . Vitamin D deficiency 04/02/2016    Past Surgical History:  Procedure Laterality Date  . APPENDECTOMY    . cataract surgery  2008  . CHOLECYSTECTOMY  2010  . JOINT REPLACEMENT Left    2012  . MASTOIDECTOMY    . POSTERIOR CERVICAL FUSION/FORAMINOTOMY N/A 04/27/2017   Procedure: CERVICAL FIVE-SIX  OPEN REDUCTION OF FRACTURE, CERVICAL THREE-SEVEN  DORSAL FIXATION AND FUSION;  Surgeon: Ditty, Kevan Ny, MD;  Location: [facility];  Service: Neurosurgery;  Laterality: N/A;  . SHOULDER ARTHROSCOPY Bilateral prior to 2010  . TONSILLECTOMY    . TOTAL HIP ARTHROPLASTY Right 03/22/2015   Procedure: RIGHT TOTAL HIP ARTHROPLASTY ANTERIOR APPROACH;  Surgeon: Dorna Leitz, MD;  Location: [facility];  Service: Orthopedics;   Laterality: Right;  . TOTAL KNEE ARTHROPLASTY Bilateral 2007  . TUBAL LIGATION      Family History  Problem Relation Age of Onset  . Dementia Mother   . Hypertension Mother   . Obesity Mother   . Heart disease Father   . Hypertension Father   . Stroke Father   . Diabetes Father   . Hyperlipidemia Father   . Alcoholism Father   . Obesity Father   . Arthritis Daughter   . Cancer Maternal Grandfather        colon cancer    Social History   Socioeconomic History  . Marital status: Widowed    Spouse name: Not on file  . Number of children: Not on file  . Years of education: Not on file  . Highest education level: Not on file  Occupational History  . Occupation: Retired  Tobacco Use  . Smoking status: Former Research scientist (life sciences)  . Smokeless tobacco: Never Used  . Tobacco comment: Stopped 50 years ago.   Vaping Use  . Vaping Use: Never used  Substance and Sexual Activity  . Alcohol use: Yes    Alcohol/week: 0.0 standard drinks    Comment: socially 3-4x/wk  . Drug use: No  . Sexual activity: Never  Other Topics Concern  . Not on file  Social History Narrative   Lives at [place]  [place], widowed 7 years, no dietary restrictions. Retired from neuropsychiatry work.    Social Determinants of Health   Financial Resource Strain: Low Risk   . Difficulty of Paying Living Expenses: Not hard at all  Food Insecurity: No Food Insecurity  . Worried About Charity fundraiser in the Last Year: Never true  . Ran Out of Food in the Last Year: Never true  Transportation Needs: No Transportation Needs  . Lack of Transportation (Medical): No  . Lack of Transportation (Non-Medical): No  Physical Activity: Not on file  Stress: Not on file  Social Connections: Not on file  Intimate Partner Violence: Not on file    Outpatient Medications Prior to Visit  Medication Sig Dispense Refill  . Biotin 10000 MCG TABS Take by mouth.    . brimonidine (ALPHAGAN) 0.2 % ophthalmic solution Place 1 drop into  both eyes 2 (two) times daily. 5 mL 12  . famotidine (PEPCID) 40 MG tablet TAKE 1 TABLET BY MOUTH AT  BEDTIME AS NEEDED 90 tablet 3  . levothyroxine (SYNTHROID) 125 MCG tablet TAKE 1 TABLET BY MOUTH  DAILY 90 tablet 3  . losartan (COZAAR) 50 MG tablet TAKE 1 TABLET BY MOUTH  DAILY 90 tablet 3  . mirabegron ER (MYRBETRIQ) 50 MG TB24 tablet Take by mouth.    . Multiple Vitamins-Minerals (MULTIVITAMIN WITH MINERALS) tablet Take 1 tablet by mouth daily.    Marland Kitchen omeprazole (PRILOSEC) 20 MG capsule TAKE 1 CAPSULE BY MOUTH  DAILY 90 capsule 3  . polyethylene glycol (MIRALAX / GLYCOLAX) packet Take 17 g by mouth daily. 14 each 0   No facility-administered medications prior to visit.    Allergies  Allergen Reactions  . Other Other (See Comments)    Seasonal allergies(pollen & grass)    Review of Systems  Constitutional: Negative for fever and malaise/fatigue.  HENT: Positive for congestion and sinus pain.   Eyes: Negative for blurred vision, pain, discharge and redness.  Respiratory: Negative for shortness of breath.   Cardiovascular: Negative for chest pain, palpitations and leg swelling.  Gastrointestinal: Negative for abdominal pain, blood in stool and nausea.  Genitourinary: Negative for dysuria and frequency.  Musculoskeletal: Negative for falls.  Skin: Negative for rash.  Neurological: Positive for headaches. Negative for dizziness and loss of consciousness.  Endo/Heme/Allergies: Negative for environmental allergies.  Psychiatric/Behavioral: Negative for depression. The patient is not nervous/anxious.        Objective:    Physical Exam Vitals and nursing note reviewed.  Constitutional:      General: She is not in acute distress.    Appearance: She is well-developed.  HENT:     Head: Normocephalic and atraumatic.     Nose: Nose normal.  Eyes:     General:        Right eye: No discharge.        Left eye: No discharge.  Cardiovascular:     Rate and Rhythm: Normal rate and  regular rhythm.     Heart sounds: No murmur heard.   Pulmonary:     Effort: Pulmonary effort is normal.     Breath sounds: Normal breath sounds.  Abdominal:     General: Bowel sounds are normal.     Palpations: Abdomen is soft.     Tenderness: There is no abdominal tenderness.  Musculoskeletal:     Cervical back: Normal range of motion and neck supple.  Skin:    General: Skin is warm and dry.  Neurological:  Mental Status: She is alert and oriented to person, place, and time.     BP 122/76   Pulse 90   Temp (!) 97.4 F (36.3 C)   Resp 16   Wt 216 lb 6.4 oz (98.2 kg)   SpO2 94%   BMI 36.01 kg/m  Wt Readings from Last 3 Encounters:  01/06/21 216 lb 6.4 oz (98.2 kg)  12/18/20 215 lb 6.4 oz (97.7 kg)  11/25/20 216 lb 3.2 oz (98.1 kg)    Diabetic Foot Exam - Simple   No data filed    Lab Results  Component Value Date   WBC 4.8 12/18/2020   HGB 13.7 12/18/2020   HCT 41.6 12/18/2020   PLT 213 12/18/2020   GLUCOSE 134 (H) 12/18/2020   CHOL 202 (H) 10/09/2020   TRIG 121.0 10/09/2020   HDL 66.00 10/09/2020   LDLDIRECT 138.0 05/15/2019   LDLCALC 112 (H) 10/09/2020   ALT 19 12/18/2020   AST 18 12/18/2020   NA 138 12/18/2020   K 4.7 12/18/2020   CL 100 12/18/2020   CREATININE 0.75 12/18/2020   BUN 14 12/18/2020   CO2 25 12/18/2020   TSH 2.19 10/09/2020   INR 1.05 04/28/2017   HGBA1C 6.2 10/09/2020    Lab Results  Component Value Date   TSH 2.19 10/09/2020   Lab Results  Component Value Date   WBC 4.8 12/18/2020   HGB 13.7 12/18/2020   HCT 41.6 12/18/2020   MCV 97.2 12/18/2020   PLT 213 12/18/2020   Lab Results  Component Value Date   NA 138 12/18/2020   K 4.7 12/18/2020   CO2 25 12/18/2020   GLUCOSE 134 (H) 12/18/2020   BUN 14 12/18/2020   CREATININE 0.75 12/18/2020   BILITOT 0.4 12/18/2020   ALKPHOS 105 12/18/2020   AST 18 12/18/2020   ALT 19 12/18/2020   PROT 6.8 12/18/2020   ALBUMIN 3.9 12/18/2020   CALCIUM 8.7 (L) 12/18/2020    ANIONGAP 13 12/18/2020   GFR 78.73 10/09/2020   Lab Results  Component Value Date   CHOL 202 (H) 10/09/2020   Lab Results  Component Value Date   HDL 66.00 10/09/2020   Lab Results  Component Value Date   LDLCALC 112 (H) 10/09/2020   Lab Results  Component Value Date   TRIG 121.0 10/09/2020   Lab Results  Component Value Date   CHOLHDL 3 10/09/2020   Lab Results  Component Value Date   HGBA1C 6.2 10/09/2020       Assessment & Plan:   Problem List Items Addressed This Visit    Hypertension    Well controlled, no changes to meds. Encouraged heart healthy diet such as the DASH diet and exercise as tolerated.       Arthritis    Check labs, stay active. Report worsening       Relevant Orders   Ambulatory referral to Rheumatology   Hypothyroidism    On Levothyroxine, continue to monitor      Vitamin D deficiency    Supplement and monitor      Hyperlipidemia, mild    Encouraged heart healthy diet, increase exercise, avoid trans fats, consider a krill oil cap daily      Urinary incontinence    Is doing much better on Myrbetriq the computer says she is taking 50 mg daily her concern is her stream is difficult to keep going. She will check her bottles at home if it is 50 we could consider dropping to  25 mg       Recurrent sinusitis    Pain over left frontal sinus with ocular symptoms monthly. Repeat ct of sinuses.       Relevant Medications   levocetirizine (XYZAL) 5 MG tablet   Other Relevant Orders   CT Maxillofacial WO CM   Hyperglycemia    hgba1c acceptable, minimize simple carbs. Increase exercise as tolerated.       Low back pain    Encouraged moist heat and gentle stretching as tolerated. May try NSAIDs and prescription meds as directed and report if symptoms worsen or seek immediate care      Relevant Orders   Ambulatory referral to Rheumatology   Sun-damaged skin    Referred to dermatology for ongoing monitoring. [facility]       Relevant Orders   Ambulatory referral to Dermatology   Recurrent UTI    Check ua and culture      Relevant Orders   Urinalysis   Urine Culture   Facial pain - Primary    Over left forehead and behind left eye. Has a history of sinusitis. Will proceed with CT scan of sinuses and then decide on treatment      Relevant Orders   CT Maxillofacial WO CM      I am having [name] start on levocetirizine. I am also having her maintain her multivitamin with minerals, brimonidine, polyethylene glycol, Biotin, famotidine, omeprazole, levothyroxine, losartan, and mirabegron ER.  Meds ordered this encounter  Medications  . levocetirizine (XYZAL) 5 MG tablet    Sig: Take 1 tablet (5 mg total) by mouth every evening.    Dispense:  30 tablet    Refill:  3     Penni Homans, MD

## 2021-01-06 NOTE — Assessment & Plan Note (Signed)
Check labs, stay active. Report worsening

## 2021-01-06 NOTE — Assessment & Plan Note (Signed)
Referred to dermatology for ongoing monitoring. [place]

## 2021-01-06 NOTE — Assessment & Plan Note (Signed)
Encouraged heart healthy diet, increase exercise, avoid trans fats, consider a krill oil cap daily 

## 2021-01-06 NOTE — Assessment & Plan Note (Signed)
hgba1c acceptable, minimize simple carbs. Increase exercise as tolerated.  

## 2021-01-06 NOTE — Assessment & Plan Note (Signed)
Check ua and culture 

## 2021-01-06 NOTE — Assessment & Plan Note (Signed)
Pain over left frontal sinus with ocular symptoms monthly. Repeat ct of sinuses.

## 2021-01-06 NOTE — Assessment & Plan Note (Signed)
Encouraged moist heat and gentle stretching as tolerated. May try NSAIDs and prescription meds as directed and report if symptoms worsen or seek immediate care 

## 2021-01-06 NOTE — Assessment & Plan Note (Signed)
Over left forehead and behind left eye. Has a history of sinusitis. Will proceed with CT scan of sinuses and then decide on treatment

## 2021-01-06 NOTE — Assessment & Plan Note (Signed)
On Levothyroxine, continue to monitor 

## 2021-01-06 NOTE — Patient Instructions (Signed)
Breast Cancer, Female  Breast cancer is a malignant growth of tissue (tumor) in the breast. Unlike noncancerous (benign) tumors, malignant tumors are cancerous and can spread to other parts of the body. The two most common types of breast cancer start in the milk ducts (ductal carcinoma) or in the lobules where milk is made in the breast (lobular carcinoma). Breast cancer is one of the most common types of cancer in women. What are the causes? The exact cause of female breast cancer is unknown. What increases the risk? The following factors may make you more likely to develop this condition:  Being older than 85 years of age.  Race and ethnicity. Caucasian women generally have an increased risk, but African-American women are more likely to develop the disease before age 82.  Having a family history of breast cancer.  Having had breast cancer in the past.  Having certain noncancerous conditions of the breast, such as dense breast tissue.  Having the BRCA1 and BRCA2 genes.  Having a history of radiation exposure.  Obesity.  Starting menopause after age 62.  Starting your menstrual periods before age 69.  Having never been pregnant or having your first child after age 11.  Having never breastfed.  Using hormone therapy after menopause.  Using birth control pills.  Drinking more than one alcoholic drink a day.  Exposure to the drug DES, which was given to pregnant women from the 1940s to the 1970s. What are the signs or symptoms? Symptoms of this condition include:  A painless lump or thickening in your breast.  Changes in the size or shape of your breast.  Breast skin changes, such as puckering or dimpling.  Nipple abnormalities, such as scaling, crustiness, redness, or pulling in (retraction).  Nipple discharge that is bloody or clear.   How is this diagnosed? This condition may be diagnosed by:  Taking your medical history and doing a physical exam. During the  exam, your health care provider will feel the tissue around your breast and under your arms.  Taking a sample of nipple discharge. The sample will be examined under a microscope.  Performing imaging tests, such as breast X-rays (mammogram), breast ultrasound exams, or an MRI.  Taking a tissue sample (biopsy) from the breast. The sample will be examined under a microscope to look for cancer cells.  Taking a sample from the lymph nodes near the affected breast (sentinel node biopsy). Your cancer will be staged to determine its severity and extent. Staging is a careful attempt to find out the size of the tumor, whether the cancer has spread, and if so, to what parts of the body. Staging also includes testing your tumor for certain receptors, such as estrogen, progesterone, and human epidermal growth factor receptor 2 (HER2). This will help your cancer care team decide on a treatment that will work best for you. You may need to have more tests to determine the stage of your cancer. Stages include the following:  Stage 0--The tumor has not spread to other breast tissue.  Stage I--The cancer is only found in the breast or may be in the lymph nodes. The tumor may be up to  in (2 cm) wide.  Stage II--The cancer has spread to nearby lymph nodes. The tumor may be up to 2 in (5 cm) wide.  Stage III--The cancer has spread to more distant lymph nodes. The tumor may be larger than 2 in (5 cm) wide.  Stage IV--The cancer has spread to other  parts of the body, such as the bones, brain, liver, or lungs.   How is this treated? Treatment for this condition depends on the type and stage of the breast cancer. It may be treated with:  Surgery. This may involve breast-conserving surgery (lumpectomy or partial mastectomy) in which only the part of the breast containing the cancer is removed. Some normal tissue surrounding this area may also be removed. In some cases, surgery may be done to remove the entire breast  (mastectomy) and nipple. Lymph nodes may also be removed.  Radiation therapy, which uses high-energy rays to kill cancer cells.  Chemotherapy, which is the use of drugs to kill cancer cells.  Hormone therapy, which involves taking medicine to adjust the hormone levels in your body. You may take medicine to decrease your estrogen levels. This can help stop cancer cells from growing.  Targeted therapy, in which drugs are used to block the growth and spread of cancer cells. These drugs target a specific part of the cancer cell and usually cause fewer side effects than chemotherapy. Targeted therapy may be used alone or in combination with chemotherapy.  A combination of surgery, radiation, chemotherapy, or hormone therapy may be needed to treat breast cancer. Follow these instructions at home:  Take over-the-counter and prescription medicines only as told by your health care provider.  Eat a healthy diet. A healthy diet includes lots of fruits and vegetables, low-fat dairy products, lean meats, and fiber. ? Make sure half your plate is filled with fruits or vegetables. ? Choose high-fiber foods such as whole-grain breads and cereals.  Consider joining a support group. This may help you learn to cope with the stress of having breast cancer.  Talk to your health care team about exercise and physical activity. The right exercise program can: ? Help prevent or reduce symptoms such as fatigue or depression. ? Improve overall health and survival rates.  Keep all follow-up visits as told by your health care provider. This is important. Where to find more information  American Cancer Society: www.cancer.[place]: www.cancer.gov Contact a health care provider if:  You have a sudden increase in pain.  You have any symptoms or changes that concern you.  You lose weight without trying.  You notice a new lump in either breast or under your arm.  You develop swelling in  either arm or hand.  You have a fever.  You notice new fatigue or weakness. Get help right away if:  You have chest pain or trouble breathing.  You faint. Summary  Breast cancer is a malignant growth of tissue (tumor) in the breast.  Your cancer will be staged to determine its severity and extent.  Treatment for this condition depends on the type and stage of the breast cancer. This information is not intended to replace advice given to you by your health care provider. Make sure you discuss any questions you have with your health care provider. Document Revised: 08/13/2017 Document Reviewed: 04/26/2017 Elsevier Patient Education  2021 [employer].

## 2021-01-06 NOTE — Assessment & Plan Note (Signed)
Supplement and monitor 

## 2021-01-06 NOTE — Progress Notes (Signed)
   Covid-19 Vaccination Clinic  Name:  Catherine Coleman    MRN: [id number] DOB: [birth year]  01/06/2021  [name] was observed post Covid-19 immunization for 15 minutes without incident. She was provided with Vaccine Information Sheet and instruction to access the V-Safe system.   [name] was instructed to call 911 with any severe reactions post vaccine: Marland Kitchen Difficulty breathing  . Swelling of face and throat  . A fast heartbeat  . A bad rash all over body  . Dizziness and weakness   Immunizations Administered    Name Date Dose VIS Date Route   PFIZER Comrnaty(Gray TOP) Covid-19 Vaccine 01/06/2021 12:48 PM 0.3 mL 08/22/2020 Intramuscular   Manufacturer: [place]   Lot: [id number]   [place]: [phone]

## 2021-01-06 NOTE — Assessment & Plan Note (Signed)
Is doing much better on Myrbetriq the computer says she is taking 50 mg daily her concern is her stream is difficult to keep going. She will check her bottles at home if it is 50 we could consider dropping to 25 mg

## 2021-01-07 LAB — URINALYSIS, ROUTINE W REFLEX MICROSCOPIC
Bilirubin Urine: NEGATIVE
Hgb urine dipstick: NEGATIVE
Ketones, ur: NEGATIVE
Nitrite: POSITIVE — AB
RBC / HPF: NONE SEEN (ref 0–?)
Specific Gravity, Urine: 1.015 (ref 1.000–1.030)
Total Protein, Urine: NEGATIVE
Urine Glucose: NEGATIVE
Urobilinogen, UA: 0.2 (ref 0.0–1.0)
pH: 6 (ref 5.0–8.0)

## 2021-01-09 ENCOUNTER — Other Ambulatory Visit: Payer: Self-pay | Admitting: Family Medicine

## 2021-01-09 LAB — URINE CULTURE
MICRO NUMBER:: 11814995
SPECIMEN QUALITY:: ADEQUATE

## 2021-01-09 MED ORDER — CEFDINIR 300 MG PO CAPS: 300.0000 mg | ORAL_CAPSULE | Freq: Two times a day (BID) | ORAL | 0 refills | Status: AC

## 2021-01-14 ENCOUNTER — Encounter (HOSPITAL_BASED_OUTPATIENT_CLINIC_OR_DEPARTMENT_OTHER): Payer: Self-pay | Admitting: Surgery

## 2021-01-14 ENCOUNTER — Other Ambulatory Visit: Payer: Self-pay

## 2021-01-17 ENCOUNTER — Encounter (HOSPITAL_BASED_OUTPATIENT_CLINIC_OR_DEPARTMENT_OTHER)
Admission: RE | Admit: 2021-01-17 | Discharge: 2021-01-17 | Disposition: A | Discharge status: Home / Self Care | Payer: Medicare Other | Source: Ambulatory Visit | Attending: Surgery | Admitting: Surgery

## 2021-01-17 ENCOUNTER — Other Ambulatory Visit (HOSPITAL_COMMUNITY)
Admission: RE | Admit: 2021-01-17 | Discharge: 2021-01-17 | Disposition: A | Discharge status: Home / Self Care | Payer: Medicare Other | Source: Ambulatory Visit | Attending: Surgery | Admitting: Surgery

## 2021-01-17 DIAGNOSIS — Z20822 Contact with and (suspected) exposure to covid-19: Secondary | ICD-10-CM | POA: Insufficient documentation

## 2021-01-17 DIAGNOSIS — Z01818 Encounter for other preprocedural examination: Secondary | ICD-10-CM | POA: Diagnosis not present

## 2021-01-17 DIAGNOSIS — Z01812 Encounter for preprocedural laboratory examination: Secondary | ICD-10-CM | POA: Insufficient documentation

## 2021-01-17 DIAGNOSIS — I10 Essential (primary) hypertension: Secondary | ICD-10-CM | POA: Insufficient documentation

## 2021-01-17 LAB — SARS CORONAVIRUS 2 (TAT 6-24 HRS): SARS Coronavirus 2: NEGATIVE

## 2021-01-17 MED ORDER — CHLORHEXIDINE GLUCONATE CLOTH 2 % EX PADS: 6.0000 | MEDICATED_PAD | Freq: Once | CUTANEOUS | Status: DC

## 2021-01-17 MED ORDER — CHLORHEXIDINE GLUCONATE CLOTH 2 % EX PADS
6.0000 | MEDICATED_PAD | Freq: Once | CUTANEOUS | Status: DC
Start: 2021-01-17 — End: 2021-01-21

## 2021-01-17 MED ORDER — ENSURE PRE-SURGERY PO LIQD: 296.0000 mL | Freq: Once | ORAL | Status: DC

## 2021-01-17 NOTE — Progress Notes (Signed)
      Enhanced Recovery after Surgery for Orthopedics Enhanced Recovery after Surgery is a protocol used to improve the stress on your body and your recovery after surgery.  Patient Instructions  . The night before surgery:  o No food after midnight. ONLY clear liquids after midnight  . The day of surgery (if you do NOT have diabetes):  o Drink ONE (1) Pre-Surgery Clear Ensure as directed.   o This drink was given to you during your hospital  pre-op appointment visit. o The pre-op nurse will instruct you on the time to drink the  Pre-Surgery Ensure depending on your surgery time. o Finish the drink at the designated time by the pre-op nurse.  o Nothing else to drink after completing the  Pre-Surgery Clear Ensure.  . The day of surgery (if you have diabetes): o Drink ONE (1) Gatorade 2 (G2) as directed. o This drink was given to you during your hospital  pre-op appointment visit.  o The pre-op nurse will instruct you on the time to drink the   Gatorade 2 (G2) depending on your surgery time. o Color of the Gatorade may vary. Red is not allowed. o Nothing else to drink after completing the  Gatorade 2 (G2).         If you have questions, please contact your surgeon's office. Surgical soap and instructions given to patient. Patient verbalized understanding. 

## 2021-01-20 ENCOUNTER — Ambulatory Visit
Admission: RE | Admit: 2021-01-20 | Discharge: 2021-01-20 | Disposition: A | Discharge status: Home / Self Care | Payer: Medicare Other | Source: Ambulatory Visit | Attending: Surgery | Admitting: Surgery

## 2021-01-20 ENCOUNTER — Other Ambulatory Visit: Payer: Self-pay

## 2021-01-20 DIAGNOSIS — C50912 Malignant neoplasm of unspecified site of left female breast: Secondary | ICD-10-CM | POA: Diagnosis not present

## 2021-01-20 DIAGNOSIS — Z853 Personal history of malignant neoplasm of breast: Secondary | ICD-10-CM

## 2021-01-20 NOTE — H&P (Signed)
Catherine Coleman  Location: [facility] Patient #: [id number] DOB: 09/25/1931 Widowed / Language: Cleophus Molt / Race: White Female   History of Present Illness  The patient is a 87 year old female who presents with breast cancer. Chief complaint: Left breast cancer  This is an 87 year old female who was found to have a small in the left breast in screen mammography. This measured 1 cm in greatest dimensions on ultrasound. Her axilla was unremarkable. She underwent a biopsy showing an invasive ductal carcinoma which was 90% ER, 60% PR positive, HER-2 negative, and a Ki-67 of 15 percent. She has had no previous problems regarding her breast. She denies nipple discharge. She is otherwise without complaints. She has had multiple surgeries and has no issues with anesthesia. She denies cardiopulmonary issues.    Past Surgical History Appendectomy  Breast Biopsy  Bilateral. Cataract Surgery  Bilateral. Gallbladder Surgery - Laparoscopic  Hip Surgery  Bilateral. Knee Surgery  Bilateral. Shoulder Surgery  Bilateral. Spinal Surgery - Neck   Diagnostic Studies History Colonoscopy  5-10 years ago Mammogram  within last year Pap Smear  >5 years ago  Medication History  Medications Reconciled  Social History Alcohol use  Occasional alcohol use. Caffeine use  Coffee. No drug use  Tobacco use  Never smoker.  Family History Alcohol Abuse  Father. Arthritis  Mother. Cerebrovascular Accident  Father, Mother. Depression  Mother. Heart Disease  Father. Heart disease in female family member before age 60  Hypertension  Brother, Mother. Migraine Headache  Father, Mother.  Pregnancy / Birth History Age at menarche  30 years. Age of menopause  72-62 Contraceptive History  Oral contraceptives. Gravida  4 Length (months) of breastfeeding  3-6 Maternal age  13-25 Para  4 Regular periods   Other Problems  Back Pain  Gastroesophageal  Reflux Disease  High blood pressure  Migraine Headache  Thyroid Disease  Transfusion history  Ulcerative Colitis     Review of Systems General Not Present- Appetite Loss, Chills, Fatigue, Fever, Night Sweats, Weight Gain and Weight Loss. Skin Not Present- Change in Wart/Mole, Dryness, Hives, Jaundice, New Lesions, Non-Healing Wounds, Rash and Ulcer. HEENT Present- Hearing Loss, Seasonal Allergies and Sinus Pain. Not Present- Earache, Hoarseness, Nose Bleed, Oral Ulcers, Ringing in the Ears, Sore Throat, Visual Disturbances, Wears glasses/contact lenses and Yellow Eyes. Respiratory Not Present- Bloody sputum, Chronic Cough, Difficulty Breathing, Snoring and Wheezing. Breast Not Present- Breast Mass, Breast Pain, Nipple Discharge and Skin Changes. Cardiovascular Not Present- Chest Pain, Difficulty Breathing Lying Down, Leg Cramps, Palpitations, Rapid Heart Rate, Shortness of Breath and Swelling of Extremities. Gastrointestinal Present- Bloating and Indigestion. Not Present- Abdominal Pain, Bloody Stool, Change in Bowel Habits, Chronic diarrhea, Constipation, Difficulty Swallowing, Excessive gas, Gets full quickly at meals, Hemorrhoids, Nausea, Rectal Pain and Vomiting. Female Genitourinary Not Present- Frequency, Nocturia, Painful Urination, Pelvic Pain and Urgency. Musculoskeletal Present- Joint Pain and Swelling of Extremities. Not Present- Back Pain, Joint Stiffness, Muscle Pain and Muscle Weakness. Neurological Not Present- Decreased Memory, Fainting, Headaches, Numbness, Seizures, Tingling, Tremor, Trouble walking and Weakness. Psychiatric Not Present- Anxiety, Bipolar, Change in Sleep Pattern, Depression, Fearful and Frequent crying. Endocrine Not Present- Cold Intolerance, Excessive Hunger, Hair Changes, Heat Intolerance, Hot flashes and New Diabetes. Hematology Not Present- Blood Thinners, Easy Bruising, Excessive bleeding, Gland problems, HIV and Persistent  Infections.   Physical Exam  The physical exam findings are as follows: Note: She appears well on exam  There is ecchymosis and small hematoma left breast from biopsy.  She does have a small mole inframammary ridge of the left breast. There is no axillary adenopathy. 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 I reviewed her mammograms, ultrasound, and pathology results. We also discussed to this morning in our multidisciplinary breast cancer conference.  She had an invasive ductal carcinoma of the left breast. We discussed this in detail. We discussed surgical options which includes breast conservation versus mastectomy. She wished to proceed with breast conservation. We next discussed proceeding with a radioactive seed guided left breast lumpectomy. I described the procedure in detail. We discussed the risk which includes but not limited to bleeding, infection, the need for further surgery if margins are positive, injury to surrounding structures, cardiopulmonary issues postoperative recovery, etc. She understands and wishes to proceed with surgery which will be scheduled.

## 2021-01-21 ENCOUNTER — Other Ambulatory Visit: Payer: Self-pay

## 2021-01-21 ENCOUNTER — Ambulatory Visit (HOSPITAL_BASED_OUTPATIENT_CLINIC_OR_DEPARTMENT_OTHER): Payer: Medicare Other | Admitting: Anesthesiology

## 2021-01-21 ENCOUNTER — Ambulatory Visit
Admission: RE | Admit: 2021-01-21 | Discharge: 2021-01-21 | Disposition: A | Discharge status: Home / Self Care | Payer: Medicare Other | Source: Ambulatory Visit | Attending: Surgery | Admitting: Surgery

## 2021-01-21 ENCOUNTER — Ambulatory Visit (HOSPITAL_BASED_OUTPATIENT_CLINIC_OR_DEPARTMENT_OTHER)
Admission: RE | Admit: 2021-01-21 | Discharge: 2021-01-21 | Disposition: A | Discharge status: Home / Self Care | Payer: Medicare Other | Attending: Surgery | Admitting: Surgery

## 2021-01-21 ENCOUNTER — Encounter (HOSPITAL_BASED_OUTPATIENT_CLINIC_OR_DEPARTMENT_OTHER)
Admission: RE | Disposition: A | Discharge status: Home / Self Care | Payer: Self-pay | Source: Home / Self Care | Attending: Surgery

## 2021-01-21 ENCOUNTER — Encounter (HOSPITAL_BASED_OUTPATIENT_CLINIC_OR_DEPARTMENT_OTHER): Payer: Self-pay | Admitting: Surgery

## 2021-01-21 DIAGNOSIS — Z820 Family history of epilepsy and other diseases of the nervous system: Secondary | ICD-10-CM | POA: Diagnosis not present

## 2021-01-21 DIAGNOSIS — C50912 Malignant neoplasm of unspecified site of left female breast: Secondary | ICD-10-CM | POA: Diagnosis not present

## 2021-01-21 DIAGNOSIS — E039 Hypothyroidism, unspecified: Secondary | ICD-10-CM | POA: Diagnosis not present

## 2021-01-21 DIAGNOSIS — E079 Disorder of thyroid, unspecified: Secondary | ICD-10-CM | POA: Insufficient documentation

## 2021-01-21 DIAGNOSIS — Z823 Family history of stroke: Secondary | ICD-10-CM | POA: Insufficient documentation

## 2021-01-21 DIAGNOSIS — C50312 Malignant neoplasm of lower-inner quadrant of left female breast: Secondary | ICD-10-CM | POA: Insufficient documentation

## 2021-01-21 DIAGNOSIS — Z8249 Family history of ischemic heart disease and other diseases of the circulatory system: Secondary | ICD-10-CM | POA: Insufficient documentation

## 2021-01-21 DIAGNOSIS — K219 Gastro-esophageal reflux disease without esophagitis: Secondary | ICD-10-CM | POA: Diagnosis not present

## 2021-01-21 DIAGNOSIS — Z853 Personal history of malignant neoplasm of breast: Secondary | ICD-10-CM

## 2021-01-21 DIAGNOSIS — Z8261 Family history of arthritis: Secondary | ICD-10-CM | POA: Diagnosis not present

## 2021-01-21 DIAGNOSIS — I10 Essential (primary) hypertension: Secondary | ICD-10-CM | POA: Insufficient documentation

## 2021-01-21 DIAGNOSIS — Z170 Estrogen receptor positive status [ER+]: Secondary | ICD-10-CM | POA: Insufficient documentation

## 2021-01-21 DIAGNOSIS — R928 Other abnormal and inconclusive findings on diagnostic imaging of breast: Secondary | ICD-10-CM | POA: Diagnosis not present

## 2021-01-21 HISTORY — PX: BREAST LUMPECTOMY WITH RADIOACTIVE SEED LOCALIZATION: SHX6424

## 2021-01-21 SURGERY — BREAST LUMPECTOMY WITH RADIOACTIVE SEED LOCALIZATION
Anesthesia: General | Site: Breast | Laterality: Left

## 2021-01-21 MED ORDER — OXYCODONE HCL 5 MG PO TABS
5.0000 mg | ORAL_TABLET | Freq: Once | ORAL | Status: DC | PRN
Start: 2021-01-21 — End: 2021-01-21

## 2021-01-21 MED ORDER — EPHEDRINE 5 MG/ML INJ
INTRAVENOUS | Status: AC
  Filled 2021-01-21: qty 10

## 2021-01-21 MED ORDER — CEFAZOLIN SODIUM-DEXTROSE 2-4 GM/100ML-% IV SOLN
2.0000 g | INTRAVENOUS | Status: AC
  Administered 2021-01-21: 2 g via INTRAVENOUS

## 2021-01-21 MED ORDER — ACETAMINOPHEN 500 MG PO TABS
ORAL_TABLET | ORAL | Status: AC
  Filled 2021-01-21: qty 2

## 2021-01-21 MED ORDER — LIDOCAINE 2% (20 MG/ML) 5 ML SYRINGE
INTRAMUSCULAR | Status: AC
  Filled 2021-01-21: qty 5

## 2021-01-21 MED ORDER — DEXAMETHASONE SODIUM PHOSPHATE 4 MG/ML IJ SOLN
INTRAMUSCULAR | Status: DC | PRN
  Administered 2021-01-21: 10 mg via INTRAVENOUS

## 2021-01-21 MED ORDER — BUPIVACAINE-EPINEPHRINE (PF) 0.5% -1:200000 IJ SOLN
INTRAMUSCULAR | Status: AC
  Filled 2021-01-21: qty 60

## 2021-01-21 MED ORDER — HYDROMORPHONE HCL 1 MG/ML IJ SOLN
INTRAMUSCULAR | Status: AC
  Filled 2021-01-21: qty 0.5

## 2021-01-21 MED ORDER — PROPOFOL 10 MG/ML IV BOLUS
INTRAVENOUS | Status: DC | PRN
  Administered 2021-01-21: 150 mg via INTRAVENOUS

## 2021-01-21 MED ORDER — HYDROMORPHONE HCL 1 MG/ML IJ SOLN
0.2500 mg | INTRAMUSCULAR | Status: DC | PRN
  Administered 2021-01-21: 0.25 mg via INTRAVENOUS

## 2021-01-21 MED ORDER — FENTANYL CITRATE (PF) 100 MCG/2ML IJ SOLN
INTRAMUSCULAR | Status: AC
  Filled 2021-01-21: qty 2

## 2021-01-21 MED ORDER — LACTATED RINGERS IV SOLN: INTRAVENOUS | Status: DC

## 2021-01-21 MED ORDER — ONDANSETRON HCL 4 MG/2ML IJ SOLN
INTRAMUSCULAR | Status: DC | PRN
  Administered 2021-01-21: 4 mg via INTRAVENOUS

## 2021-01-21 MED ORDER — BUPIVACAINE-EPINEPHRINE 0.5% -1:200000 IJ SOLN
INTRAMUSCULAR | Status: DC | PRN
  Administered 2021-01-21: 17 mL

## 2021-01-21 MED ORDER — EPHEDRINE SULFATE 50 MG/ML IJ SOLN
INTRAMUSCULAR | Status: DC | PRN
  Administered 2021-01-21: 10 mg via INTRAVENOUS

## 2021-01-21 MED ORDER — TRAMADOL HCL 50 MG PO TABS: 50.0000 mg | ORAL_TABLET | Freq: Four times a day (QID) | ORAL | 0 refills | Status: DC | PRN

## 2021-01-21 MED ORDER — ONDANSETRON HCL 4 MG/2ML IJ SOLN
INTRAMUSCULAR | Status: AC
  Filled 2021-01-21: qty 2

## 2021-01-21 MED ORDER — PROMETHAZINE HCL 25 MG/ML IJ SOLN: 6.2500 mg | INTRAMUSCULAR | Status: DC | PRN

## 2021-01-21 MED ORDER — ACETAMINOPHEN 500 MG PO TABS
1000.0000 mg | ORAL_TABLET | ORAL | Status: AC
  Administered 2021-01-21: 1000 mg via ORAL

## 2021-01-21 MED ORDER — DEXAMETHASONE SODIUM PHOSPHATE 10 MG/ML IJ SOLN
INTRAMUSCULAR | Status: AC
  Filled 2021-01-21: qty 1

## 2021-01-21 MED ORDER — LIDOCAINE 2% (20 MG/ML) 5 ML SYRINGE
INTRAMUSCULAR | Status: DC | PRN
  Administered 2021-01-21: 60 mg via INTRAVENOUS

## 2021-01-21 MED ORDER — PROPOFOL 10 MG/ML IV BOLUS
INTRAVENOUS | Status: AC
  Filled 2021-01-21: qty 20

## 2021-01-21 MED ORDER — OXYCODONE HCL 5 MG/5ML PO SOLN: 5.0000 mg | Freq: Once | ORAL | Status: DC | PRN

## 2021-01-21 MED ORDER — CEFAZOLIN SODIUM-DEXTROSE 2-4 GM/100ML-% IV SOLN
INTRAVENOUS | Status: AC
  Filled 2021-01-21: qty 100

## 2021-01-21 MED ORDER — AMISULPRIDE (ANTIEMETIC) 5 MG/2ML IV SOLN: 10.0000 mg | Freq: Once | INTRAVENOUS | Status: DC | PRN

## 2021-01-21 MED ORDER — FENTANYL CITRATE (PF) 100 MCG/2ML IJ SOLN
INTRAMUSCULAR | Status: DC | PRN
  Administered 2021-01-21: 25 ug via INTRAVENOUS
  Administered 2021-01-21: 50 ug via INTRAVENOUS

## 2021-01-21 SURGICAL SUPPLY — 50 items
ADH SKN CLS APL DERMABOND .7 (GAUZE/BANDAGES/DRESSINGS) ×1
APL PRP STRL LF DISP 70% ISPRP (MISCELLANEOUS) ×1
APPLIER CLIP 9.375 MED OPEN (MISCELLANEOUS) ×2
APR CLP MED 9.3 20 MLT OPN (MISCELLANEOUS) ×1
BINDER BREAST 3XL (GAUZE/BANDAGES/DRESSINGS) IMPLANT
BINDER BREAST LRG (GAUZE/BANDAGES/DRESSINGS) IMPLANT
BINDER BREAST MEDIUM (GAUZE/BANDAGES/DRESSINGS) IMPLANT
BINDER BREAST XLRG (GAUZE/BANDAGES/DRESSINGS) ×2 IMPLANT
BINDER BREAST XXLRG (GAUZE/BANDAGES/DRESSINGS) IMPLANT
BLADE SURG 15 STRL LF DISP TIS (BLADE) ×1 IMPLANT
BLADE SURG 15 STRL SS (BLADE) ×2
CANISTER SUC SOCK COL 7IN (MISCELLANEOUS) IMPLANT
CANISTER SUCT 1200ML W/VALVE (MISCELLANEOUS) IMPLANT
CHLORAPREP W/TINT 26 (MISCELLANEOUS) ×2 IMPLANT
CLIP APPLIE 9.375 MED OPEN (MISCELLANEOUS) ×1 IMPLANT
COVER BACK TABLE 60X90IN (DRAPES) ×2 IMPLANT
COVER MAYO STAND STRL (DRAPES) ×2 IMPLANT
COVER PROBE W GEL 5X96 (DRAPES) ×2 IMPLANT
COVER WAND RF STERILE (DRAPES) IMPLANT
DECANTER SPIKE VIAL GLASS SM (MISCELLANEOUS) IMPLANT
DERMABOND ADVANCED (GAUZE/BANDAGES/DRESSINGS) ×1
DERMABOND ADVANCED .7 DNX12 (GAUZE/BANDAGES/DRESSINGS) ×1 IMPLANT
DRAPE LAPAROSCOPIC ABDOMINAL (DRAPES) ×2 IMPLANT
DRAPE UTILITY XL STRL (DRAPES) ×2 IMPLANT
ELECT REM PT RETURN 9FT ADLT (ELECTROSURGICAL) ×2
ELECTRODE REM PT RTRN 9FT ADLT (ELECTROSURGICAL) ×1 IMPLANT
GAUZE SPONGE 4X4 12PLY STRL LF (GAUZE/BANDAGES/DRESSINGS) IMPLANT
GLOVE SURG ENC MOIS LTX SZ6.5 (GLOVE) ×2 IMPLANT
GLOVE SURG SIGNA 7.5 PF LTX (GLOVE) ×2 IMPLANT
GLOVE SURG UNDER POLY LF SZ7 (GLOVE) ×2 IMPLANT
GOWN STRL REUS W/ TWL LRG LVL3 (GOWN DISPOSABLE) ×1 IMPLANT
GOWN STRL REUS W/ TWL XL LVL3 (GOWN DISPOSABLE) ×1 IMPLANT
GOWN STRL REUS W/TWL LRG LVL3 (GOWN DISPOSABLE) ×2
GOWN STRL REUS W/TWL XL LVL3 (GOWN DISPOSABLE) ×2
KIT MARKER MARGIN INK (KITS) ×2 IMPLANT
NEEDLE HYPO 25X1 1.5 SAFETY (NEEDLE) ×2 IMPLANT
NS IRRIG 1000ML POUR BTL (IV SOLUTION) IMPLANT
PACK BASIN DAY SURGERY FS (CUSTOM PROCEDURE TRAY) ×2 IMPLANT
PENCIL SMOKE EVACUATOR (MISCELLANEOUS) ×2 IMPLANT
SLEEVE SCD COMPRESS KNEE MED (STOCKING) ×2 IMPLANT
SPONGE LAP 4X18 RFD (DISPOSABLE) ×2 IMPLANT
SUT MNCRL AB 4-0 PS2 18 (SUTURE) ×2 IMPLANT
SUT SILK 2 0 SH (SUTURE) IMPLANT
SUT VIC AB 3-0 SH 27 (SUTURE) ×2
SUT VIC AB 3-0 SH 27X BRD (SUTURE) ×1 IMPLANT
SYR CONTROL 10ML LL (SYRINGE) ×2 IMPLANT
TOWEL GREEN STERILE FF (TOWEL DISPOSABLE) ×2 IMPLANT
TRAY FAXITRON CT DISP (TRAY / TRAY PROCEDURE) ×2 IMPLANT
TUBE CONNECTING 20X1/4 (TUBING) IMPLANT
YANKAUER SUCT BULB TIP NO VENT (SUCTIONS) IMPLANT

## 2021-01-21 NOTE — Interval H&P Note (Signed)
History and Physical Interval Note: no change in H and P  01/21/2021 9:09 AM  Catherine Coleman  has presented today for surgery, with the diagnosis of LEFT BREAST CANCER.  The various methods of treatment have been discussed with the patient and family. After consideration of risks, benefits and other options for treatment, the patient has consented to  Procedure(s): LEFT BREAST LUMPECTOMY WITH RADIOACTIVE SEED LOCALIZATION (Left) as a surgical intervention.  The patient's history has been reviewed, patient examined, no change in status, stable for surgery.  I have reviewed the patient's chart and labs.  Questions were answered to the patient's satisfaction.     [name]

## 2021-01-21 NOTE — Anesthesia Preprocedure Evaluation (Signed)
Anesthesia Evaluation  Patient identified by MRN, date of birth, ID band Patient awake    Reviewed: Allergy & Precautions, NPO status , Patient's Chart, lab work & pertinent test results  Airway Mallampati: III   Neck ROM: Limited   Comment: C Collar in place.  Dental  (+) Teeth Intact, Dental Advisory Given   Pulmonary former smoker,    breath sounds clear to auscultation       Cardiovascular hypertension, negative cardio ROS   Rhythm:Regular Rate:Normal     Neuro/Psych  Headaches, negative psych ROS   GI/Hepatic Neg liver ROS, GERD  Medicated,  Endo/Other  Hypothyroidism   Renal/GU negative Renal ROS     Musculoskeletal  (+) Arthritis , Osteoarthritis,    Abdominal (+) + obese,   Peds  Hematology negative hematology ROS (+)   Anesthesia Other Findings   Reproductive/Obstetrics negative OB ROS                             Anesthesia Physical  Anesthesia Plan  ASA: III  Anesthesia Plan: General   Post-op Pain Management:    Induction: Intravenous  PONV Risk Score and Plan: 3 and Ondansetron, Dexamethasone, Midazolam and Treatment may vary due to age or medical condition  Airway Management Planned: LMA  Additional Equipment:   Intra-op Plan:   Post-operative Plan: Extubation in OR  Informed Consent: I have reviewed the patients History and Physical, chart, labs and discussed the procedure including the risks, benefits and alternatives for the proposed anesthesia with the patient or authorized representative who has indicated his/her understanding and acceptance.     Dental advisory given  Plan Discussed with: CRNA  Anesthesia Plan Comments:         Anesthesia Quick Evaluation

## 2021-01-21 NOTE — Anesthesia Postprocedure Evaluation (Signed)
Anesthesia Post Note  Patient: Catherine Coleman  Procedure(s) Performed: LEFT BREAST LUMPECTOMY WITH RADIOACTIVE SEED LOCALIZATION (Left Breast)     Patient location during evaluation: PACU Anesthesia Type: General Level of consciousness: awake and alert Pain management: pain level controlled Vital Signs Assessment: post-procedure vital signs reviewed and stable Respiratory status: spontaneous breathing, nonlabored ventilation and respiratory function stable Cardiovascular status: blood pressure returned to baseline and stable Postop Assessment: no apparent nausea or vomiting Anesthetic complications: no   No complications documented.  Last Vitals:  Vitals:   01/21/21 1100 01/21/21 1114  BP: 132/79 (!) 152/81  Pulse: 60 65  Resp: 11 12  Temp:  (!) 36.3 C  SpO2: 96% 96%    Last Pain:  Vitals:   01/21/21 1114  TempSrc:   PainSc: [place]

## 2021-01-21 NOTE — Discharge Instructions (Signed)
[facility] Office Phone Number [phone]  BREAST BIOPSY/ PARTIAL MASTECTOMY: POST OP INSTRUCTIONS  Always review your discharge instruction sheet given to you by the facility where your surgery was performed.  IF YOU HAVE DISABILITY OR FAMILY LEAVE FORMS, YOU MUST BRING THEM TO THE OFFICE FOR PROCESSING.  DO NOT GIVE THEM TO YOUR DOCTOR.  1. A prescription for pain medication may be given to you upon discharge.  Take your pain medication as prescribed, if needed.  If narcotic pain medicine is not needed, then you may take acetaminophen (Tylenol) or ibuprofen (Advil) as needed. 2. Take your usually prescribed medications unless otherwise directed 3. If you need a refill on your pain medication, please contact your pharmacy.  They will contact our office to request authorization.  Prescriptions will not be filled after 5pm or on week-ends. 4. You should eat very light the first 24 hours after surgery, such as soup, crackers, pudding, etc.  Resume your normal diet the day after surgery. 5. Most patients will experience some swelling and bruising in the breast.  Ice packs and a good support bra will help.  Swelling and bruising can take several days to resolve.  6. It is common to experience some constipation if taking pain medication after surgery.  Increasing fluid intake and taking a stool softener will usually help or prevent this problem from occurring.  A mild laxative (Milk of Magnesia or Miralax) should be taken according to package directions if there are no bowel movements after 48 hours. 7. Unless discharge instructions indicate otherwise, you may remove your bandages 24-48 hours after surgery, and you may shower at that time.  You may have steri-strips (small skin tapes) in place directly over the incision.  These strips should be left on the skin for 7-10 days.  If your surgeon used skin glue on the incision, you may shower in 24 hours.  The glue will flake off over the  next 2-3 weeks.  Any sutures or staples will be removed at the office during your follow-up visit. 8. ACTIVITIES:  You may resume regular daily activities (gradually increasing) beginning the next day.  Wearing a good support bra or sports bra minimizes pain and swelling.  You may have sexual intercourse when it is comfortable. a. You may drive when you no longer are taking prescription pain medication, you can comfortably wear a seatbelt, and you can safely maneuver your car and apply brakes. b. RETURN TO WORK:  ______________________________________________________________________________________ 9. You should see your doctor in the office for a follow-up appointment approximately two weeks after your surgery.  Your doctor's nurse will typically make your follow-up appointment when she calls you with your pathology report.  Expect your pathology report 2-3 business days after your surgery.  You may call to check if you do not hear from us after three days. 10. OTHER INSTRUCTIONS:OK TO REMOVE THE BINDER AND SHOWER STARTING TOMORROW 11. ICE PACK, TYLENOL, AND IBUPROFEN ALSO FOR PAIN 12. NO VIGOROUS ACTIVITY FOR ONE WEEK _______________________________________________________________________________________________ _____________________________________________________________________________________________________________________________________ _____________________________________________________________________________________________________________________________________ _____________________________________________________________________________________________________________________________________  WHEN TO CALL YOUR DOCTOR: 1. Fever over 101.0 2. Nausea and/or vomiting. 3. Extreme swelling or bruising. 4. Continued bleeding from incision. 5. Increased pain, redness, or drainage from the incision.  The clinic staff is available to answer your questions during regular business hours.   Please don't hesitate to call and ask to speak to one of the nurses for clinical concerns.  If you have a medical emergency, go to the nearest emergency room or   call 911.  A surgeon from [facility] is always on call at the hospital.   No tylenol until 2pm   Post Anesthesia Home Care Instructions  Activity: Get plenty of rest for the remainder of the day. A responsible individual must stay with you for 24 hours following the procedure.  For the next 24 hours, DO NOT: -Drive a car -Paediatric nurse -Drink alcoholic beverages -Take any medication unless instructed by your physician -Make any legal decisions or sign important papers.  Meals: Start with liquid foods such as gelatin or soup. Progress to regular foods as tolerated. Avoid greasy, spicy, heavy foods. If nausea and/or vomiting occur, drink only clear liquids until the nausea and/or vomiting subsides. Call your physician if vomiting continues.  Special Instructions/Symptoms: Your throat may feel dry or sore from the anesthesia or the breathing tube placed in your throat during surgery. If this causes discomfort, gargle with warm salt water. The discomfort should disappear within 24 hours.  If you had a scopolamine patch placed behind your ear for the management of post- operative nausea and/or vomiting:  1. The medication in the patch is effective for 72 hours, after which it should be removed.  Wrap patch in a tissue and discard in the trash. Wash hands thoroughly with soap and water. 2. You may remove the patch earlier than 72 hours if you experience unpleasant side effects which may include dry mouth, dizziness or visual disturbances. 3. Avoid touching the patch. Wash your hands with soap and water after contact with the patch.     For further questions, please visit [email or web address]

## 2021-01-21 NOTE — Anesthesia Procedure Notes (Signed)
Procedure Name: LMA Insertion Date/Time: 01/21/2021 9:45 AM Performed by: Ezequiel Kayser, CRNA Pre-anesthesia Checklist: Patient identified, Emergency Drugs available, Suction available and Patient being monitored Patient Re-evaluated:Patient Re-evaluated prior to induction Oxygen Delivery Method: Circle System Utilized Preoxygenation: Pre-oxygenation with 100% oxygen Induction Type: IV induction Ventilation: Mask ventilation without difficulty LMA: LMA inserted LMA Size: 4.0 Number of attempts: 1 Airway Equipment and Method: Bite block Placement Confirmation: positive ETCO2 Tube secured with: Tape Dental Injury: Teeth and Oropharynx as per pre-operative assessment

## 2021-01-21 NOTE — Op Note (Signed)
LEFT BREAST LUMPECTOMY WITH RADIOACTIVE SEED LOCALIZATION  Procedure Note  Catherine Coleman 01/21/2021   Pre-op Diagnosis: LEFT BREAST CANCER     Post-op Diagnosis: same  Procedure(s): LEFT BREAST LUMPECTOMY WITH RADIOACTIVE SEED LOCALIZATION  Surgeon(s): [name], MD  Anesthesia: General  Staff:  Circulator: Ted Mcalpine, RN Scrub Person: Lorenza Burton, CST  Estimated Blood Loss: Minimal               Specimens: sent to path  Indications: This is a 87 year old female found to have a mass in the left breast at the 7 o'clock position on screening mammography.  She had a stereotactic biopsy of this showing invasive ductal carcinoma.  The decision was made to proceed with a radioactive seed guided left breast lumpectomy  Findings: The mass and seed were just below the skin at the 7 o'clock position underneath the nipple areolar complex.  I undermined the skin and nipple circumferentially leaving no breast tissue anteriorly.  Procedure: The patient was brought to operating identifies correct patient.  She is placed upon on the operating room table and general anesthesia was induced.  Her left breast was then prepped and draped in usual sterile fashion.  Using neoprobe I located the radioactive seed at the 7 position underneath the areola.  I anesthetized the medial edge of the areola with Marcaine and made a circumareolar incision with a scalpel.  I then dissected down to the breast tissue with the cautery.  I then undermined did the skin and nipple areolar complex circumferentially removing all breast tissue underneath the skin.  Then with the aid of the neoprobe I was able to perform a wide lumpectomy staying widely around the radioactive seed.  I then took the dissection deep to the seed with the cautery and completed the lumpectomy circumferentially.  I marked all margins with paint.  We then x-rayed the specimen confirming that the radioactive seed and previous tissue  marker were in the center of the specimen.  The specimen was then sent to pathology for evaluation.  I achieved hemostasis with the cautery.  I anesthetized the incision further with Marcaine.  I placed surgical clips around the periphery of the lumpectomy cavity.  I then closed the subcutaneous tissue with interrupted 3-0 Vicryl sutures and closed the skin with a running 4-0 Monocryl.  Dermabond was then applied.  The patient was next placed in a breast binder.  She tolerated the procedure well.  All the counts were correct at the end the procedure.  She was then extubated in the operating room and taken in a stable condition to the recovery room.          [name]   Date: 01/21/2021  Time: 10:19 AM

## 2021-01-21 NOTE — Transfer of Care (Signed)
Immediate Anesthesia Transfer of Care Note  Patient: Catherine Coleman  Procedure(s) Performed: LEFT BREAST LUMPECTOMY WITH RADIOACTIVE SEED LOCALIZATION (Left Breast)  Patient Location: PACU  Anesthesia Type:General  Level of Consciousness: awake  Airway & Oxygen Therapy: Patient Spontanous Breathing and Patient connected to face mask oxygen  Post-op Assessment: Report given to RN and Post -op Vital signs reviewed and stable  Post vital signs: Reviewed and stable  Last Vitals:  Vitals Value Taken Time  BP 135/71 01/21/21 1024  Temp    Pulse 76 01/21/21 1026  Resp 15 01/21/21 1026  SpO2 99 % 01/21/21 1026  Vitals shown include unvalidated device data.  Last Pain:  Vitals:   01/21/21 0752  TempSrc: [place]  PainSc: 1       Patients Stated Pain Goal: 2 (49/17/91 [id number])  Complications: No complications documented.

## 2021-01-22 ENCOUNTER — Encounter (HOSPITAL_BASED_OUTPATIENT_CLINIC_OR_DEPARTMENT_OTHER): Payer: Self-pay | Admitting: Surgery

## 2021-01-24 LAB — SURGICAL PATHOLOGY

## 2021-01-27 ENCOUNTER — Encounter: Payer: Self-pay | Admitting: *Deleted

## 2021-01-28 ENCOUNTER — Other Ambulatory Visit: Payer: Self-pay

## 2021-01-28 ENCOUNTER — Ambulatory Visit (HOSPITAL_BASED_OUTPATIENT_CLINIC_OR_DEPARTMENT_OTHER)
Admission: RE | Admit: 2021-01-28 | Discharge: 2021-01-28 | Disposition: A | Discharge status: Home / Self Care | Payer: Medicare Other | Source: Ambulatory Visit | Attending: Family Medicine | Admitting: Family Medicine

## 2021-01-28 DIAGNOSIS — J329 Chronic sinusitis, unspecified: Secondary | ICD-10-CM | POA: Insufficient documentation

## 2021-01-28 DIAGNOSIS — R519 Headache, unspecified: Secondary | ICD-10-CM | POA: Diagnosis not present

## 2021-02-04 ENCOUNTER — Other Ambulatory Visit: Payer: Self-pay

## 2021-02-20 ENCOUNTER — Ambulatory Visit
Admission: RE | Admit: 2021-02-20 | Discharge: 2021-02-20 | Disposition: A | Discharge status: Home / Self Care | Payer: Medicare Other | Source: Ambulatory Visit | Attending: Radiation Oncology | Admitting: Radiation Oncology

## 2021-02-20 ENCOUNTER — Telehealth: Payer: Self-pay | Admitting: Oncology

## 2021-02-20 ENCOUNTER — Encounter: Payer: Self-pay | Admitting: Radiation Oncology

## 2021-02-20 ENCOUNTER — Other Ambulatory Visit: Payer: Self-pay

## 2021-02-20 VITALS — BP 151/72 | HR 63 | Temp 97.9°F | Resp 20 | Ht 65.0 in | Wt 214.2 lb

## 2021-02-20 DIAGNOSIS — C50312 Malignant neoplasm of lower-inner quadrant of left female breast: Secondary | ICD-10-CM

## 2021-02-20 DIAGNOSIS — M1990 Unspecified osteoarthritis, unspecified site: Secondary | ICD-10-CM | POA: Insufficient documentation

## 2021-02-20 DIAGNOSIS — R5383 Other fatigue: Secondary | ICD-10-CM | POA: Diagnosis not present

## 2021-02-20 DIAGNOSIS — K449 Diaphragmatic hernia without obstruction or gangrene: Secondary | ICD-10-CM | POA: Diagnosis not present

## 2021-02-20 DIAGNOSIS — Z87891 Personal history of nicotine dependence: Secondary | ICD-10-CM | POA: Insufficient documentation

## 2021-02-20 DIAGNOSIS — Z79899 Other long term (current) drug therapy: Secondary | ICD-10-CM | POA: Diagnosis not present

## 2021-02-20 DIAGNOSIS — Z510 Encounter for antineoplastic radiation therapy: Secondary | ICD-10-CM | POA: Diagnosis not present

## 2021-02-20 DIAGNOSIS — I10 Essential (primary) hypertension: Secondary | ICD-10-CM | POA: Diagnosis not present

## 2021-02-20 DIAGNOSIS — Z800 Family history of malignant neoplasm of digestive organs: Secondary | ICD-10-CM | POA: Insufficient documentation

## 2021-02-20 DIAGNOSIS — K5900 Constipation, unspecified: Secondary | ICD-10-CM | POA: Insufficient documentation

## 2021-02-20 DIAGNOSIS — E039 Hypothyroidism, unspecified: Secondary | ICD-10-CM | POA: Insufficient documentation

## 2021-02-20 DIAGNOSIS — E669 Obesity, unspecified: Secondary | ICD-10-CM | POA: Insufficient documentation

## 2021-02-20 DIAGNOSIS — K219 Gastro-esophageal reflux disease without esophagitis: Secondary | ICD-10-CM | POA: Insufficient documentation

## 2021-02-20 DIAGNOSIS — Z170 Estrogen receptor positive status [ER+]: Secondary | ICD-10-CM | POA: Diagnosis not present

## 2021-02-20 DIAGNOSIS — E559 Vitamin D deficiency, unspecified: Secondary | ICD-10-CM | POA: Insufficient documentation

## 2021-02-20 DIAGNOSIS — E785 Hyperlipidemia, unspecified: Secondary | ICD-10-CM | POA: Diagnosis not present

## 2021-02-20 NOTE — Progress Notes (Signed)
      SAFETY ISSUES: Prior radiation? NO Pacemaker/ICD? No Possible current pregnancy?No Is the patient on methotrexate? No  Current Complaints / other details:  None    Sherrlyn Hock, RN 02/20/2021,8:04 AM Vitals:   02/20/21 0804  BP: (!) 151/72  Pulse: 63  Resp: 20  Temp: 97.9 F (36.6 C)  SpO2: 97%  Weight: 97.2 kg  Height: 5\' 5"  (1.651 m)    Patient states that she has taken her blood pressure medication this morning.

## 2021-02-20 NOTE — Telephone Encounter (Signed)
Per 6/9 sch msg, pt aware 

## 2021-02-20 NOTE — Progress Notes (Signed)
Radiation Oncology         [phone] ________________________________  Name: Catherine Coleman        MRN: [id number]  Date of Service: 02/20/2021 DOB: [birth year]  BE:MLJQG, [name], MD  Magrinat, Virgie Dad, MD     REFERRING PHYSICIAN: Magrinat, Virgie Dad, MD   DIAGNOSIS: The encounter diagnosis was Malignant neoplasm of lower-inner quadrant of left breast in female, estrogen receptor positive ([place]).   HISTORY OF PRESENT ILLNESS: Catherine Coleman is a 87 y.o. female originally seen in the multidisciplinary breast clinic for a new diagnosis of left breast cancer. The patient was noted to have a screening detected mass in the left breast as well as right calcifications.  Diagnostic work-up continued and her right calcifications were biopsied and consistent with fat necrosis and calcifications on 12/11/2020.  The persistent finding in the left breast however was at 7 o'clock position measuring 1 cm, her axilla was negative for adenopathy and biopsies also on 12/11/2020 showed a grade 2 invasive ductal carcinoma that was ER/PR positive, HER-2 was negative, Ki-67 was 15%.    Since her last visit the patient has undergone left lumpectomy on 01/21/2021.  Final pathology reveals a grade 2 invasive ductal carcinoma measuring 1.1 cm.  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  Margins for invasive disease were clear however DCIS focally involve the anterior margin.  She is seen today to discuss treatment recommendations in the adjuvant setting.    PREVIOUS RADIATION THERAPY: No   PAST MEDICAL HISTORY:  Past Medical History:  Diagnosis Date   Arthritis    Atrophic rhinitis 04/21/2016   Breast cancer ([place]) 12/2020   left breast IDC   Constipation    Decreased hearing    Diverticulitis    Dry eyes    Fatigue    Floaters in visual field    Gall bladder disease    GERD (gastroesophageal reflux disease)    Glaucoma    Hiatal hernia with gastroesophageal reflux    History of blood transfusion     for Knee replacement   Hyperlipidemia, mild 04/02/2016   Hypertension    Hypothyroidism    Joint pain    Migraine aura without headache    Muscle pain    Muscle stiffness    Nasal congestion    Obesity 04/02/2016   Osteoarthritis    Parotiditis 04/21/2016   Vitamin D deficiency 04/02/2016       PAST SURGICAL HISTORY: Past Surgical History:  Procedure Laterality Date   APPENDECTOMY     BREAST LUMPECTOMY WITH RADIOACTIVE SEED LOCALIZATION Left 01/21/2021   Procedure: LEFT BREAST LUMPECTOMY WITH RADIOACTIVE SEED LOCALIZATION;  Surgeon: [name], MD;  Location: [facility];  Service: General;  Laterality: Left;   cataract surgery  2008   CHOLECYSTECTOMY  2010   JOINT REPLACEMENT Left    2012   MASTOIDECTOMY     POSTERIOR CERVICAL FUSION/FORAMINOTOMY N/A 04/27/2017   Procedure: CERVICAL FIVE-SIX  OPEN REDUCTION OF FRACTURE, CERVICAL THREE-SEVEN  DORSAL FIXATION AND FUSION;  Surgeon: Ditty, Kevan Ny, MD;  Location: [facility];  Service: Neurosurgery;  Laterality: N/A;   SHOULDER ARTHROSCOPY Bilateral prior to 2010   [place] Right 03/22/2015   Procedure: RIGHT TOTAL HIP ARTHROPLASTY ANTERIOR APPROACH;  Surgeon: Dorna Leitz, MD;  Location: [facility];  Service: Orthopedics;  Laterality: Right;   TOTAL KNEE ARTHROPLASTY Bilateral 2007   TUBAL LIGATION       FAMILY HISTORY:  Family History  Problem Relation Age of Onset   Dementia Mother    Hypertension Mother    Obesity Mother    Heart disease Father    Hypertension Father    Stroke Father    Diabetes Father    Hyperlipidemia Father    Alcoholism Father    Obesity Father    Arthritis Daughter    Cancer Maternal Grandfather        colon cancer     SOCIAL HISTORY:  reports that she has quit smoking. She has never used smokeless tobacco. She reports current alcohol use. She reports that she does not use drugs.  The patient is widowed and resides in [place]. 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 She enjoys exercise and swims regularly at her retirement complex.   ALLERGIES: Other   MEDICATIONS:  Current Outpatient Medications  Medication Sig Dispense Refill   Biotin 10000 MCG TABS Take by mouth.     brimonidine (ALPHAGAN) 0.2 % ophthalmic solution Place 1 drop into both eyes 2 (two) times daily. 5 mL 12   famotidine (PEPCID) 40 MG tablet TAKE 1 TABLET BY MOUTH AT  BEDTIME AS NEEDED 90 tablet 3   levocetirizine (XYZAL) 5 MG tablet Take 1 tablet (5 mg total) by mouth every evening. 30 tablet 3   levothyroxine (SYNTHROID) 125 MCG tablet TAKE 1 TABLET BY MOUTH  DAILY 90 tablet 3   losartan (COZAAR) 50 MG tablet TAKE 1 TABLET BY MOUTH  DAILY 90 tablet 3   mirabegron ER (MYRBETRIQ) 50 MG TB24 tablet Take by mouth.     Multiple Vitamins-Minerals (MULTIVITAMIN WITH MINERALS) tablet Take 1 tablet by mouth daily.     omeprazole (PRILOSEC) 20 MG capsule TAKE 1 CAPSULE BY MOUTH  DAILY 90 capsule 3   polyethylene glycol (MIRALAX / GLYCOLAX) packet Take 17 g by mouth daily. 14 each 0   traMADol (ULTRAM) 50 MG tablet Take 1 tablet (50 mg total) by mouth every 6 (six) hours as needed for moderate pain or severe pain. 20 tablet 0   No current facility-administered medications for this encounter.     REVIEW OF SYSTEMS: On review of systems, the patient reports that she is doing well overall. She denies significant pain in the breast and is pleased so far with her healing. No other complaints are verbalized.     PHYSICAL EXAM:  Wt Readings from Last 3 Encounters:  01/21/21 216 lb 0.8 oz (98 kg)  01/06/21 216 lb 6.4 oz (98.2 kg)  12/18/20 215 lb 6.4 oz (97.7 kg)   Temp Readings from Last 3 Encounters:  01/21/21 (!) 97.4 F (36.3 C)  01/06/21 (!) 97.4 F (36.3 C)  12/18/20 97.7 F (36.5 C) (Temporal)   BP Readings from Last 3 Encounters:  01/21/21 (!) 152/81  01/06/21 122/76  12/18/20 (!) 145/70   Pulse  Readings from Last 3 Encounters:  01/21/21 65  01/06/21 90  12/18/20 66    In general this is a well appearing Caucasian female in no acute distress. She's alert and oriented x4 and appropriate throughout the examination. Cardiopulmonary assessment is negative for acute distress and she exhibits normal effort. The left breast reveals a well healed incision without erythema, separation or bleeding.    ECOG = 0  0 - Asymptomatic (Fully active, able to carry on all predisease activities without restriction)  1 - Symptomatic but completely ambulatory (Restricted in physically strenuous activity but ambulatory and able to carry out  work of a light or sedentary nature. For example, light housework, office work)  2 - Symptomatic, <50% in bed during the day (Ambulatory and capable of all self care but unable to carry out any work activities. Up and about more than 50% of waking hours)  3 - Symptomatic, >50% in bed, but not bedbound (Capable of only limited self-care, confined to bed or chair 50% or more of waking hours)  4 - Bedbound (Completely disabled. Cannot carry on any self-care. Totally confined to bed or chair)  5 - Death   Eustace Pen MM, Creech RH, Tormey DC, et al. [phone]). "Toxicity and response criteria of the [facility]". [place] Oncol. 5 (6): 649-55    LABORATORY DATA:  Lab Results  Component Value Date   WBC 4.8 12/18/2020   HGB 13.7 12/18/2020   HCT 41.6 12/18/2020   MCV 97.2 12/18/2020   PLT 213 12/18/2020   Lab Results  Component Value Date   NA 138 12/18/2020   K 4.7 12/18/2020   CL 100 12/18/2020   CO2 25 12/18/2020   Lab Results  Component Value Date   ALT 19 12/18/2020   AST 18 12/18/2020   ALKPHOS 105 12/18/2020   BILITOT 0.4 12/18/2020      RADIOGRAPHY: MM Breast Surgical Specimen  Result Date: 01/21/2021 CLINICAL DATA:  Specimen radiograph status post left breast lumpectomy. EXAM: SPECIMEN RADIOGRAPH OF THE LEFT BREAST  COMPARISON:  Previous exam(s). FINDINGS: Status post excision of the left breast. The radioactive seed and biopsy marker clip are present and completely intact. These findings were communicated with the OR at 10:08 a.m. IMPRESSION: Specimen radiograph of the left breast. Electronically Signed   By: Ammie Ferrier M.D.   On: 01/21/2021 10:09   CT Maxillofacial WO CM  Result Date: 01/30/2021 CLINICAL DATA:  Maxillofacial pain, primarily on the left side EXAM: CT MAXILLOFACIAL WITHOUT CONTRAST TECHNIQUE: Multidetector CT imaging of the maxillofacial structures was performed. Multiplanar CT image reconstructions were also generated. COMPARISON:  None. FINDINGS: Osseous: No fracture or dislocation. No blastic or lytic bone lesions are evident. Symmetric alignment of the temporomandibular joints noted. Orbits: Orbits appear symmetric bilaterally. No intraorbital lesions are appreciable. Sinuses: There is mild mucosal thickening in several ethmoid air cells. Other paranasal sinuses are clear. No air-fluid level. No bony destruction or expansion. Ostiomeatal unit complexes appear patent bilaterally. Nares bilaterally are patent. Nasal septum midline. Soft tissues: No appreciable soft tissue mass or inflammatory focus. No evident hematoma or abscess. No adenopathy appreciable. Visualized salivary glands unremarkable. Visualized upper pharynx appears normal. No tongue region lesion evident. Limited intracranial: There is a degree of atrophy. No focal intracranial lesions evident. Mastoids clear bilaterally. IMPRESSION: 1. No appreciable inflammatory lesion. No soft tissue edema or hematoma. No abnormal fluid collections. 2. No bony abnormality. No blastic or lytic bone lesions. No inflammatory focus. 3.  No intraorbital lesions.  Orbits appear symmetric. 4. Paranasal sinuses and mastoids clear. No air-fluid levels. No bony destruction or expansion. Ostiomeatal unit complexes are patent bilaterally. Nasal turbinates do  not appear edematous. Nares patent bilaterally. Electronically Signed   By: Lowella Grip III M.D.   On: 01/30/2021 10:10        IMPRESSION/PLAN: 1. Stage IA, pT1b,cN0M0 grade 2, ER/PR positive invasive ductal carcinoma of the left breast. Dr. Lisbeth Renshaw discusses the final pathology findings and reviews the nature of early stage left breast disease. Dr. Marciano Sequin  the rationale for radiotherapy to reduce risks of local recurrence,  and that Dr. Jana Hakim would also recommend that her course be followed by antiestrogen therapy. We discussed the risks, benefits, short, and long term effects of radiotherapy, as well as the curative intent.  At her last visit she was possibly interested in proceeding as she wishes to remain aggressive minded in her approach to her care. For this reason she's motivated to proceed with treatment. Dr. Lisbeth Renshaw discusses the delivery and logistics of radiotherapy and would offer 4 weeks of radiotherapy to the left breast. Written consent is obtained and placed in the chart, a copy was provided to the patient. She will simulate this morning.   In a visit lasting 45 minutes, greater than 50% of the time was spent face to face reviewing her case, as well as in preparation of, discussing, and coordinating the patient's care.  The above documentation reflects my direct findings during this shared patient visit. Please see the separate note by Dr. Lisbeth Renshaw on this date for the remainder of the patient's plan of care.    Carola Rhine, [facility]    **Disclaimer: This note was dictated with voice recognition software. Similar sounding words can inadvertently be transcribed and this note may contain transcription errors which may not have been corrected upon publication of note.**

## 2021-02-26 ENCOUNTER — Encounter: Payer: Self-pay | Admitting: *Deleted

## 2021-03-04 DIAGNOSIS — Z170 Estrogen receptor positive status [ER+]: Secondary | ICD-10-CM | POA: Diagnosis not present

## 2021-03-04 DIAGNOSIS — Z510 Encounter for antineoplastic radiation therapy: Secondary | ICD-10-CM | POA: Diagnosis not present

## 2021-03-04 DIAGNOSIS — C50312 Malignant neoplasm of lower-inner quadrant of left female breast: Secondary | ICD-10-CM | POA: Diagnosis not present

## 2021-03-05 ENCOUNTER — Ambulatory Visit
Admission: RE | Admit: 2021-03-05 | Discharge: 2021-03-05 | Disposition: A | Discharge status: Home / Self Care | Payer: Medicare Other | Source: Ambulatory Visit | Attending: Radiation Oncology | Admitting: Radiation Oncology

## 2021-03-05 DIAGNOSIS — C50312 Malignant neoplasm of lower-inner quadrant of left female breast: Secondary | ICD-10-CM | POA: Diagnosis not present

## 2021-03-05 DIAGNOSIS — Z510 Encounter for antineoplastic radiation therapy: Secondary | ICD-10-CM | POA: Diagnosis not present

## 2021-03-05 DIAGNOSIS — Z170 Estrogen receptor positive status [ER+]: Secondary | ICD-10-CM | POA: Diagnosis not present

## 2021-03-06 ENCOUNTER — Ambulatory Visit
Admission: RE | Admit: 2021-03-06 | Discharge: 2021-03-06 | Disposition: A | Discharge status: Home / Self Care | Payer: Medicare Other | Source: Ambulatory Visit | Attending: Radiation Oncology | Admitting: Radiation Oncology

## 2021-03-06 ENCOUNTER — Other Ambulatory Visit: Payer: Self-pay

## 2021-03-06 DIAGNOSIS — Z510 Encounter for antineoplastic radiation therapy: Secondary | ICD-10-CM | POA: Diagnosis not present

## 2021-03-06 DIAGNOSIS — C50312 Malignant neoplasm of lower-inner quadrant of left female breast: Secondary | ICD-10-CM | POA: Diagnosis not present

## 2021-03-06 DIAGNOSIS — Z170 Estrogen receptor positive status [ER+]: Secondary | ICD-10-CM | POA: Diagnosis not present

## 2021-03-07 ENCOUNTER — Ambulatory Visit
Admission: RE | Admit: 2021-03-07 | Discharge: 2021-03-07 | Disposition: A | Discharge status: Home / Self Care | Payer: Medicare Other | Source: Ambulatory Visit | Attending: Radiation Oncology | Admitting: Radiation Oncology

## 2021-03-07 DIAGNOSIS — Z170 Estrogen receptor positive status [ER+]: Secondary | ICD-10-CM | POA: Diagnosis not present

## 2021-03-07 DIAGNOSIS — Z510 Encounter for antineoplastic radiation therapy: Secondary | ICD-10-CM | POA: Diagnosis not present

## 2021-03-07 DIAGNOSIS — C50312 Malignant neoplasm of lower-inner quadrant of left female breast: Secondary | ICD-10-CM | POA: Diagnosis not present

## 2021-03-07 MED ORDER — ALRA NON-METALLIC DEODORANT (RAD-ONC)
1.0000 | Freq: Once | TOPICAL | Status: AC
Start: 2021-03-07 — End: 2021-03-07
  Administered 2021-03-07: 1 via TOPICAL

## 2021-03-07 MED ORDER — RADIAPLEXRX EX GEL
Freq: Once | CUTANEOUS | Status: AC
Start: 2021-03-07 — End: 2021-03-07

## 2021-03-07 NOTE — Progress Notes (Signed)

## 2021-03-10 ENCOUNTER — Other Ambulatory Visit: Payer: Self-pay

## 2021-03-10 ENCOUNTER — Ambulatory Visit
Admission: RE | Admit: 2021-03-10 | Discharge: 2021-03-10 | Disposition: A | Discharge status: Home / Self Care | Payer: Medicare Other | Source: Ambulatory Visit | Attending: Radiation Oncology | Admitting: Radiation Oncology

## 2021-03-10 DIAGNOSIS — C50312 Malignant neoplasm of lower-inner quadrant of left female breast: Secondary | ICD-10-CM | POA: Diagnosis not present

## 2021-03-10 DIAGNOSIS — Z170 Estrogen receptor positive status [ER+]: Secondary | ICD-10-CM | POA: Diagnosis not present

## 2021-03-10 DIAGNOSIS — Z510 Encounter for antineoplastic radiation therapy: Secondary | ICD-10-CM | POA: Diagnosis not present

## 2021-03-11 ENCOUNTER — Telehealth: Payer: Medicare Other

## 2021-03-11 ENCOUNTER — Ambulatory Visit (INDEPENDENT_AMBULATORY_CARE_PROVIDER_SITE_OTHER): Payer: Medicare Other | Admitting: Pharmacist

## 2021-03-11 ENCOUNTER — Ambulatory Visit
Admission: RE | Admit: 2021-03-11 | Discharge: 2021-03-11 | Disposition: A | Discharge status: Home / Self Care | Payer: Medicare Other | Source: Ambulatory Visit | Attending: Radiation Oncology | Admitting: Radiation Oncology

## 2021-03-11 DIAGNOSIS — Z510 Encounter for antineoplastic radiation therapy: Secondary | ICD-10-CM | POA: Diagnosis not present

## 2021-03-11 DIAGNOSIS — C50312 Malignant neoplasm of lower-inner quadrant of left female breast: Secondary | ICD-10-CM | POA: Diagnosis not present

## 2021-03-11 DIAGNOSIS — M85839 Other specified disorders of bone density and structure, unspecified forearm: Secondary | ICD-10-CM

## 2021-03-11 DIAGNOSIS — R7303 Prediabetes: Secondary | ICD-10-CM

## 2021-03-11 DIAGNOSIS — Z170 Estrogen receptor positive status [ER+]: Secondary | ICD-10-CM | POA: Diagnosis not present

## 2021-03-11 DIAGNOSIS — I10 Essential (primary) hypertension: Secondary | ICD-10-CM | POA: Diagnosis not present

## 2021-03-11 DIAGNOSIS — E559 Vitamin D deficiency, unspecified: Secondary | ICD-10-CM

## 2021-03-11 DIAGNOSIS — K449 Diaphragmatic hernia without obstruction or gangrene: Secondary | ICD-10-CM
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
## 2021-03-11 NOTE — Patient Instructions (Signed)
Visit Information  PATIENT GOALS:  Goals Addressed             This Visit's Progress    Chronic Care Management Pharmacy Care Plan   On track    [place] (see longitudinal plan of care for additional care plan information)  Current Barriers:  Chronic Disease Management support, education, and care coordination needs related to Hypertension, Hyperlipidemia, Pre-Diabetes, Hypothyroidism, GERD, Osteopenia, Glaucoma   Hypertension BP Readings from Last 3 Encounters:  02/20/21 (!) 151/72  01/21/21 (!) 152/81  01/06/21 122/76  Pharmacist Clinical Goal(s): Over the next 180 days, patient will work with PharmD and providers to achieve BP goal <140/90 Current regimen:  Losartan 50mg  daily Patient self care activities - Over the next 180 days, patient will: Recommend checking blood pressure 1 to 2 times per wek, document, and provide at future appointments Ensure daily salt intake < 2300 mg/day  Hyperlipidemia Lab Results  Component Value Date/Time   LDLCALC 112 (H) 10/09/2020 09:39 AM   LDLCALC 134 (H) 09/28/2018 11:47 AM   LDLDIRECT 138.0 05/15/2019 10:22 AM  Pharmacist Clinical Goal(s): Over the next 180 days, patient will work with PharmD and providers to achieve LDL goal < 100 or not have further increase in LDL Current regimen:  Diet and exercise management   Patient self care activities - Over the next 180 days, patient will: Work to limit intake of saturated and trans fat in diet to help reduce LDL  Pre-Diabetes Lab Results  Component Value Date/Time   HGBA1C 6.2 10/09/2020 09:39 AM   HGBA1C 6.3 05/15/2019 10:22 AM  Pharmacist Clinical Goal(s): Over the next 180 days, patient will work with PharmD and providers to maintain A1c goal <6.5% Current regimen:  Diet and exercise management   Patient self care activities - Over the next 180 days, patient will: Maintain A1c <6.5%  GERD / acid reflux Pharmacist Clinical Goal(s) Over the next 180 days, patient will  work with PharmD and providers to reduce symptoms of GERD / acid reflux Current regimen:  Famotidine 40mg  daily at bedtime  Omeprazole 20mg  daily Patient self care activities - Over the next 180 days, patient will: Continue current medication therapy  Osteopenia Pharmacist Clinical Goal(s) Over the next  days, patient will work with PharmD and providers to reduce risk of fracture due to osteopenia Current regimen:  None Interventions: Discussed goal of 1200mg  of calcium daily through diet and/or supplementation Discussed goal of 1000 units of vitamin D through supplementation Patient self care activities - Over the next 180 days, patient will: Ensure 1200mg  of calcium daily through diet and /or supplementation Discuss with Dr Jana Hakim if additional vitamin D supplementation is recommended.  Discuss with Dr Jana Hakim if additional bone density testing will be needed if she is started on estrogen depleting therapy   Hypothyroidism:  Pharmacist Clinical Goal(s) Over the next  days, patient will work with PharmD and providers to maintain normal thyroid hormone levels through supplementation Current regimen:  Levotyroxine 160mcg - take 1 tablet daily in morning Interventions: Recommended hold Biotin 1 week prior to testing of thyroid function Patient self care activities - Over the next 180 days, patient will: Recommend holding Biotin 1 week prior to testing of thyroid function If you are instructed to start calcium therapy, make sure to take calcium at least 4 hours after levothyroxine dose   Medication management Pharmacist Clinical Goal(s): Over the next 90 days, patient will work with PharmD and providers to maintain optimal medication adherence Current  pharmacy: [facility] Interventions Comprehensive medication review performed. Continue current medication management strategy Patient self care activities - Over the next 180 days, patient will: Focus on medication  adherence by filling and taking medications appropriately  Take medications as prescribed Report any questions or concerns to PharmD and/or provider(s)  Please see past updates related to this goal by clicking on the "Past Updates" button in the selected goal           Patient verbalizes understanding of instructions provided today and agrees to view in [place].   Telephone follow up appointment with care management team member scheduled for: 6 months  Cherre Robins, PharmD Clinical Pharmacist [place] [facility]

## 2021-03-12 ENCOUNTER — Inpatient Hospital Stay: Payer: Medicare Other | Attending: Oncology | Admitting: Oncology

## 2021-03-12 ENCOUNTER — Other Ambulatory Visit: Payer: Self-pay

## 2021-03-12 ENCOUNTER — Ambulatory Visit
Admission: RE | Admit: 2021-03-12 | Discharge: 2021-03-12 | Disposition: A | Discharge status: Home / Self Care | Payer: Medicare Other | Source: Ambulatory Visit | Attending: Radiation Oncology | Admitting: Radiation Oncology

## 2021-03-12 VITALS — BP 140/82 | HR 78 | Temp 97.5°F | Resp 18 | Ht 65.0 in | Wt 212.2 lb

## 2021-03-12 DIAGNOSIS — Z800 Family history of malignant neoplasm of digestive organs: Secondary | ICD-10-CM | POA: Insufficient documentation

## 2021-03-12 DIAGNOSIS — C50312 Malignant neoplasm of lower-inner quadrant of left female breast: Secondary | ICD-10-CM | POA: Insufficient documentation

## 2021-03-12 DIAGNOSIS — Z87891 Personal history of nicotine dependence: Secondary | ICD-10-CM | POA: Insufficient documentation

## 2021-03-12 DIAGNOSIS — Z510 Encounter for antineoplastic radiation therapy: Secondary | ICD-10-CM | POA: Diagnosis not present

## 2021-03-12 DIAGNOSIS — Z170 Estrogen receptor positive status [ER+]: Secondary | ICD-10-CM | POA: Insufficient documentation

## 2021-03-12 MED ORDER — ANASTROZOLE 1 MG PO TABS: 1.0000 mg | ORAL_TABLET | Freq: Every day | ORAL | 4 refills | Status: DC

## 2021-03-12 NOTE — Progress Notes (Signed)
[facility]  [phone] [phone]     ID: Catherine Coleman DOB: 1932/01/15  MR#: [id number]  QQV#:[id number]  Patient Care Team: [name], MD as PCP - General (Family Medicine) Mauro Kaufmann, RN as Oncology Nurse Navigator Rockwell Germany, RN as Oncology Nurse Navigator Christiann Hagerty, Virgie Dad, MD as Consulting Physician (Oncology) [name], MD as Consulting Physician (General Surgery) Kyung Rudd, MD as Consulting Physician (Radiation Oncology) Damian Leavell, MD as Consulting Physician (Obstetrics and Gynecology) Zonia Kief, MD as Consulting Physician (Rehabilitation) Ulla Gallo, MD as Consulting Physician (Dermatology) Cherre Robins, PharmD (Pharmacist) Chauncey Cruel, MD OTHER MD:  CHIEF COMPLAINT: Estrogen receptor positive breast cancer  CURRENT TREATMENT: Adjuvant radiation   INTERVAL HISTORY: Catherine Coleman returns today for follow up of her estrogen receptor positive breast cancer. She was evaluated in the multidisciplinary breast cancer clinic on 12/18/2020.  She underwent left lumpectomy on 01/21/2021 under Dr. Ninfa Linden. Pathology from the procedure [id number]) showed: invasive ductal carcinoma, 1.1 cm, grade 2; ductal carcinoma in situ, intermediate nuclear grade; DCIS focally involves anterior margin.  In light of the positive margin, she was referred back to Dr. Ida Rogue PA, [facility] Ha, to discuss radiation therapy. She agreed to proceed and began treatment on 03/05/2021. She is scheduled to finish on 04/02/2021.   REVIEW OF SYSTEMS: [name] is tolerating adjuvant radiation generally well.  She does not like the "loss of control" of lying there while the machines do strange things.  Her skin is holding up well and so far she has not noted any change in her activity level.  She walks in water 3 times a week, does water aerobics, and also walks outside when she is not doing the water walking.  She enjoys socializing at  [employer] and her daughters have been coming to visit which she has enjoyed.  A detailed review of systems today was otherwise stable   COVID 19 VACCINATION STATUS: Status post Moderna x2 with Moderna then [employer] boosters   HISTORY OF CURRENT ILLNESS: From the original intake note:  Catherine Coleman had routine screening mammography on 11/13/2020 showing a possible abnormality in the bilateral breasts. She underwent bilateral diagnostic mammography with tomography and left breast ultrasonography at [facility] on 12/02/2020 showing: breast density category B; 10 mm left breast mass at 7 o'clock; no suspicious left axillary lymphadenopathy; tow groups of indeterminate calcifications involving upper-inner and upper-outer right breast, each group spanning up to 6 cm.  Accordingly on 12/11/2020 she proceeded to biopsy of the bilateral breast areas in question. The pathology from this procedure ([id number]) showed:  1. Left Breast  - invasive ductal carcinoma, grade 2.   - Prognostic indicators significant for: estrogen receptor, 95% positive and progesterone receptor, 60% positive, both with strong staining intensity. Proliferation marker Ki67 at 15%. HER2 equivocal by immunohistochemistry (2+), but negative by fluorescent in situ hybridization with a signals ratio 1.34 and number per cell 1.95. 2. Right Breast  - fat necrosis and dystrophic calcifications  Cancer Staging Malignant neoplasm of lower-inner quadrant of left breast in female, estrogen receptor positive ([place]) Staging form: Breast, AJCC 8th Edition - Clinical stage from 12/18/2020: Stage IA (cT1b, cN0, cM0, G2, ER+, PR+, HER2-) - Signed by Chauncey Cruel, MD on 12/18/2020 Stage prefix: Initial diagnosis Histologic grading system: 3 grade system  The patient's subsequent history is as detailed below.   PAST MEDICAL HISTORY: Past Medical History:  Diagnosis Date   Arthritis    Atrophic rhinitis 04/21/2016  Breast cancer ([place])  12/2020   left breast IDC   Constipation    Decreased hearing    Diverticulitis    Dry eyes    Fatigue    Floaters in visual field    Gall bladder disease    GERD (gastroesophageal reflux disease)    Glaucoma    Hiatal hernia with gastroesophageal reflux    History of blood transfusion    for Knee replacement   Hyperlipidemia, mild 04/02/2016   Hypertension    Hypothyroidism    Joint pain    Migraine aura without headache    Muscle pain    Muscle stiffness    Nasal congestion    Obesity 04/02/2016   Osteoarthritis    Parotiditis 04/21/2016   Vitamin D deficiency 04/02/2016    PAST SURGICAL HISTORY: Past Surgical History:  Procedure Laterality Date   APPENDECTOMY     BREAST LUMPECTOMY WITH RADIOACTIVE SEED LOCALIZATION Left 01/21/2021   Procedure: LEFT BREAST LUMPECTOMY WITH RADIOACTIVE SEED LOCALIZATION;  Surgeon: [name], MD;  Location: [facility];  Service: General;  Laterality: Left;   cataract surgery  2008   CHOLECYSTECTOMY  2010   JOINT REPLACEMENT Left    2012   MASTOIDECTOMY     POSTERIOR CERVICAL FUSION/FORAMINOTOMY N/A 04/27/2017   Procedure: CERVICAL FIVE-SIX  OPEN REDUCTION OF FRACTURE, CERVICAL THREE-SEVEN  DORSAL FIXATION AND FUSION;  Surgeon: Ditty, Kevan Ny, MD;  Location: [facility];  Service: Neurosurgery;  Laterality: N/A;   SHOULDER ARTHROSCOPY Bilateral prior to 2010   [place] Right 03/22/2015   Procedure: RIGHT TOTAL HIP ARTHROPLASTY ANTERIOR APPROACH;  Surgeon: Dorna Leitz, MD;  Location: [facility];  Service: Orthopedics;  Laterality: Right;   TOTAL KNEE ARTHROPLASTY Bilateral 2007   TUBAL LIGATION      FAMILY HISTORY: Family History  Problem Relation Age of Onset   Dementia Mother    Hypertension Mother    Obesity Mother    Heart disease Father    Hypertension Father    Stroke Father    Diabetes Father    Hyperlipidemia Father    Alcoholism Father    Obesity Father    Arthritis Daughter     Cancer Maternal Grandfather        colon cancer   Her father died at age 72 from MI. Her mother died at age 50 from dementia. Catherine Coleman has one brother (and no sisters). She reports colon cancer in her maternal grandfather. There is no family history of breast or ovarian cancer to her knowledge.   GYNECOLOGIC HISTORY:  No LMP recorded. Patient is postmenopausal. Menarche: 87 years old Age at first live birth: 87 years old [place] P 4 LMP date unsure, "72-90?" Contraceptive: used from 249 072 5102 HRT never used  Hysterectomy? no BSO? 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 She is widowed. She lives independently by herself at [facility]. Daughter [name], age 100, works with computers in [place], [place]. Daughter [name], age 72, is retired but Musician and lives in [place], [place]. Daughter Shariyah Eland, age 67, works in Scientist, research (life sciences) estate in [place], [place]. Daughter Tannie Koskela, age 67, works with computers in [place], [place]. Magda Paganini and [place] graduated from [employer]. Adreanne has 5 grandchildren, two of whom are MD's.    ADVANCED DIRECTIVES: in place   HEALTH MAINTENANCE: Social History   Tobacco Use   Smoking status: Former  Pack years: 0.00   Smokeless tobacco: Never   Tobacco comments:    Stopped 50 years ago.   Vaping Use   Vaping Use: Never used  Substance Use Topics   Alcohol use: Yes    Alcohol/week: 0.0 standard drinks    Comment: socially 3-4x/wk   Drug use: No     Colonoscopy: 2014 (Dr. Stephens November), repeat not indicated (age)  PAP: date unknown  Bone density: 07/2019, -1.7   No Active Allergies   Current Outpatient Medications  Medication Sig Dispense Refill   anastrozole (ARIMIDEX) 1 MG tablet Take 1 tablet (1 mg total) by mouth daily. Start 04/28/2021 90 tablet 4   Biotin 10000 MCG TABS Take 1 tablet by mouth daily.     brimonidine (ALPHAGAN) 0.2 %  ophthalmic solution Place 1 drop into both eyes 2 (two) times daily. 5 mL 12   famotidine (PEPCID) 40 MG tablet TAKE 1 TABLET BY MOUTH AT  BEDTIME AS NEEDED 90 tablet 3   levocetirizine (XYZAL) 5 MG tablet Take 1 tablet (5 mg total) by mouth every evening. 30 tablet 3   levothyroxine (SYNTHROID) 125 MCG tablet TAKE 1 TABLET BY MOUTH  DAILY 90 tablet 3   losartan (COZAAR) 50 MG tablet TAKE 1 TABLET BY MOUTH  DAILY 90 tablet 3   mirabegron ER (MYRBETRIQ) 50 MG TB24 tablet Take by mouth.     Multiple Vitamins-Minerals (MULTIVITAMIN WITH MINERALS) tablet Take 1 tablet by mouth daily.     omeprazole (PRILOSEC) 20 MG capsule TAKE 1 CAPSULE BY MOUTH  DAILY 90 capsule 3   polyethylene glycol (MIRALAX / GLYCOLAX) packet Take 17 g by mouth daily. 14 each 0   traMADol (ULTRAM) 50 MG tablet Take 1 tablet (50 mg total) by mouth every 6 (six) hours as needed for moderate pain or severe pain. 20 tablet 0   No current facility-administered medications for this visit.    OBJECTIVE: White woman who appears stated age  87:   03/12/21 1225  BP: 140/82  Pulse: 78  Resp: 18  Temp: (!) 97.5 F (36.4 C)  SpO2: 96%     Body mass index is 35.31 kg/m.   Wt Readings from Last 3 Encounters:  03/12/21 212 lb 3.2 oz (96.3 kg)  02/20/21 214 lb 3.2 oz (97.2 kg)  01/21/21 216 lb 0.8 oz (98 kg)      ECOG FS:1 - Symptomatic but completely ambulatory  Sclerae unicteric, EOMs intact Wearing a mask No cervical or supraclavicular adenopathy Lungs no rales or rhonchi Heart regular rate and rhythm Abd soft, nontender, positive bowel sounds MSK no focal spinal tenderness, no upper extremity lymphedema Neuro: nonfocal, well oriented, appropriate affect Breasts: Right breast is benign.  The left breast is status post lumpectomy with an excellent cosmetic result.  She is also receiving radiation but there is so far no erythema.  Both axillae are benign   LAB RESULTS:  CMP     Component Value Date/Time   NA  138 12/18/2020 0807   NA 137 09/28/2018 1147   K 4.7 12/18/2020 0807   CL 100 12/18/2020 0807   CO2 25 12/18/2020 0807   GLUCOSE 134 (H) 12/18/2020 0807   BUN 14 12/18/2020 0807   BUN 9 09/28/2018 1147   CREATININE 0.75 12/18/2020 0807   CALCIUM 8.7 (L) 12/18/2020 0807   PROT 6.8 12/18/2020 0807   PROT 6.7 09/28/2018 1147   ALBUMIN 3.9 12/18/2020 0807   ALBUMIN 4.6 09/28/2018 1147   AST 18 12/18/2020  0807   ALT 19 12/18/2020 0807   ALKPHOS 105 12/18/2020 0807   BILITOT 0.4 12/18/2020 0807   GFRNONAA >60 12/18/2020 0807   GFRAA 94 09/28/2018 1147    No results found for: TOTALPROTELP, ALBUMINELP, A1GS, A2GS, BETS, BETA2SER, GAMS, MSPIKE, SPEI  Lab Results  Component Value Date   WBC 4.8 12/18/2020   NEUTROABS 2.8 12/18/2020   HGB 13.7 12/18/2020   HCT 41.6 12/18/2020   MCV 97.2 12/18/2020   PLT 213 12/18/2020    No results found for: LABCA2  No components found for: PYYFRT021  No results for input(s): INR in the last 168 hours.  No results found for: LABCA2  No results found for: RZN356  No results found for: POL410  No results found for: VUD314  No results found for: CA2729  No components found for: HGQUANT  No results found for: CEA1 / No results found for: CEA1   No results found for: AFPTUMOR  No results found for: CHROMOGRNA  No results found for: KPAFRELGTCHN, LAMBDASER, KAPLAMBRATIO (kappa/lambda light chains)  No results found for: HGBA, HGBA2QUANT, HGBFQUANT, HGBSQUAN (Hemoglobinopathy evaluation)   No results found for: LDH  No results found for: IRON, TIBC, IRONPCTSAT (Iron and TIBC)  No results found for: FERRITIN  Urinalysis    Component Value Date/Time   COLORURINE YELLOW 01/06/2021 1454   APPEARANCEUR Sl Cloudy (A) 01/06/2021 1454   LABSPEC 1.015 01/06/2021 1454   PHURINE 6.0 01/06/2021 1454   GLUCOSEU NEGATIVE 01/06/2021 1454   HGBUR NEGATIVE 01/06/2021 1454   BILIRUBINUR NEGATIVE 01/06/2021 1454   KETONESUR NEGATIVE  01/06/2021 1454   PROTEINUR NEGATIVE 05/14/2017 0757   UROBILINOGEN 0.2 01/06/2021 1454   NITRITE POSITIVE (A) 01/06/2021 1454   LEUKOCYTESUR SMALL (A) 01/06/2021 1454    STUDIES: No results found.   ELIGIBLE FOR AVAILABLE RESEARCH PROTOCOL: no  ASSESSMENT: 87 y.o. [place] woman status post left breast lower inner quadrant biopsy 12/11/2020 for a clinicalT1b N0, stage IA invasive ductal carcinoma, grade 2, estrogen and progesterone receptor positive, HER-2 not amplified, with an MIB-1 of 15%  (1) status post left lumpectomy 01/21/2021 for a pT1c pNX, stage IA invasive ductal carcinoma, grade 2, with negative margins  (2) adjuvant radiation to be completed 04/02/2021  (3) to start anastrozole 04/28/2021  (A) bone density 07/17/2019 at the [facility] shows a T score of -1.7   PLAN: Sache did well with her surgery and so far is tolerating radiation quite well.  Her prognosis overall is very good even if she did not take antiestrogens.  Antiestrogens however will make her good prognosis better.  Today we discussed the difference between tamoxifen and anastrozole in detail. She understands that anastrozole and the aromatase inhibitors in general work by blocking estrogen production. Accordingly vaginal dryness, decrease in bone density, and of course hot flashes can result. The aromatase inhibitors can also negatively affect the cholesterol profile, although that is a minor effect. One out of 5 women on aromatase inhibitors we will feel "old and achy". This arthralgia/myalgia syndrome, which resembles fibromyalgia clinically, does resolve with stopping the medications. Accordingly this is not a reason to not try an aromatase inhibitor but it is a frequent reason to stop it (in other words 20% of women will not be able to tolerate these medications).  Tamoxifen on the other hand does not block estrogen production. It does not "take away a woman's estrogen". It blocks the estrogen receptor  in breast cells. Like anastrozole, it can also cause hot flashes.  As opposed to anastrozole, tamoxifen has many estrogen-like effects. It is technically an estrogen receptor modulator. This means that in some tissues tamoxifen works like estrogen-- for example it helps strengthen the bones. It tends to improve the cholesterol profile. It can cause thickening of the endometrial lining, and even endometrial polyps or rarely cancer of the uterus.(The risk of uterine cancer due to tamoxifen is one additional cancer per thousand women year). It can cause vaginal wetness or stickiness. It can cause blood clots through this estrogen-like effect--the risk of blood clots with tamoxifen is exactly the same as with birth control pills or hormone replacement.  Neither of these agents causes mood changes or weight gain, despite the popular belief that they can have these side effects. We have data from studies comparing either of these drugs with placebo, and in those cases the control group had the same amount of weight gain and depression as the group that took the drug.  We are going to go with anastrozole but wait until the middle of August to start so she has time to recover from radiation.  She will see me late September and if she is tolerating anastrozole well the plan will be to continue that a total of 5 years.  She will be due for repeat mammography and repeat bone density sometime late this year and we will set that up at that time.  Total encounter time 20 minutes.Sarajane Jews C. Angeliz Settlemyre, MD 03/12/2021 12:44 PM Medical Oncology and Hematology [facility] [place], [place] [address] Tel. [phone]    Fax. [phone]   This document serves as a record of services personally performed by Lurline Del, MD. It was created on his behalf by Wilburn Mylar, a trained medical scribe. The creation of this record is based on the scribe's personal observations and the  provider's statements to them.   I, Lurline Del MD, have reviewed the above documentation for accuracy and completeness, and I agree with the above.   *Total Encounter Time as defined by the Centers for Medicare and Medicaid Services includes, in addition to the face-to-face time of a patient visit (documented in the note above) non-face-to-face time: obtaining and reviewing outside history, ordering and reviewing medications, tests or procedures, care coordination (communications with other health care professionals or caregivers) and documentation in the medical record.

## 2021-03-13 ENCOUNTER — Ambulatory Visit
Admission: RE | Admit: 2021-03-13 | Discharge: 2021-03-13 | Disposition: A | Discharge status: Home / Self Care | Payer: Medicare Other | Source: Ambulatory Visit | Attending: Radiation Oncology | Admitting: Radiation Oncology

## 2021-03-13 ENCOUNTER — Telehealth: Payer: Self-pay | Admitting: Oncology

## 2021-03-13 DIAGNOSIS — C50312 Malignant neoplasm of lower-inner quadrant of left female breast: Secondary | ICD-10-CM | POA: Diagnosis not present

## 2021-03-13 DIAGNOSIS — Z510 Encounter for antineoplastic radiation therapy: Secondary | ICD-10-CM | POA: Diagnosis not present

## 2021-03-13 DIAGNOSIS — Z170 Estrogen receptor positive status [ER+]: Secondary | ICD-10-CM | POA: Diagnosis not present

## 2021-03-13 NOTE — Telephone Encounter (Signed)
Scheduled appointment per 06/29 los. Left  message.

## 2021-03-14 ENCOUNTER — Other Ambulatory Visit: Payer: Self-pay

## 2021-03-14 ENCOUNTER — Ambulatory Visit
Admission: RE | Admit: 2021-03-14 | Discharge: 2021-03-14 | Disposition: A | Discharge status: Home / Self Care | Payer: Medicare Other | Source: Ambulatory Visit | Attending: Radiation Oncology | Admitting: Radiation Oncology

## 2021-03-14 DIAGNOSIS — Z170 Estrogen receptor positive status [ER+]: Secondary | ICD-10-CM | POA: Diagnosis not present

## 2021-03-14 DIAGNOSIS — C50312 Malignant neoplasm of lower-inner quadrant of left female breast: Secondary | ICD-10-CM | POA: Insufficient documentation

## 2021-03-18 ENCOUNTER — Other Ambulatory Visit: Payer: Self-pay

## 2021-03-18 ENCOUNTER — Ambulatory Visit
Admission: RE | Admit: 2021-03-18 | Discharge: 2021-03-18 | Disposition: A | Discharge status: Home / Self Care | Payer: Medicare Other | Source: Ambulatory Visit | Attending: Radiation Oncology | Admitting: Radiation Oncology

## 2021-03-18 DIAGNOSIS — C50312 Malignant neoplasm of lower-inner quadrant of left female breast: Secondary | ICD-10-CM | POA: Diagnosis not present

## 2021-03-18 DIAGNOSIS — Z170 Estrogen receptor positive status [ER+]: Secondary | ICD-10-CM | POA: Diagnosis not present

## 2021-03-19 ENCOUNTER — Encounter: Payer: Self-pay | Admitting: Radiation Oncology

## 2021-03-19 ENCOUNTER — Ambulatory Visit
Admission: RE | Admit: 2021-03-19 | Discharge: 2021-03-19 | Disposition: A | Discharge status: Home / Self Care | Payer: Medicare Other | Source: Ambulatory Visit | Attending: Radiation Oncology | Admitting: Radiation Oncology

## 2021-03-19 DIAGNOSIS — C50312 Malignant neoplasm of lower-inner quadrant of left female breast: Secondary | ICD-10-CM | POA: Diagnosis not present

## 2021-03-19 DIAGNOSIS — Z170 Estrogen receptor positive status [ER+]: Secondary | ICD-10-CM | POA: Diagnosis not present

## 2021-03-20 ENCOUNTER — Other Ambulatory Visit: Payer: Self-pay

## 2021-03-20 ENCOUNTER — Ambulatory Visit
Admission: RE | Admit: 2021-03-20 | Discharge: 2021-03-20 | Disposition: A | Discharge status: Home / Self Care | Payer: Medicare Other | Source: Ambulatory Visit | Attending: Radiation Oncology | Admitting: Radiation Oncology

## 2021-03-20 DIAGNOSIS — Z170 Estrogen receptor positive status [ER+]: Secondary | ICD-10-CM | POA: Diagnosis not present

## 2021-03-20 DIAGNOSIS — C50312 Malignant neoplasm of lower-inner quadrant of left female breast: Secondary | ICD-10-CM | POA: Diagnosis not present

## 2021-03-21 ENCOUNTER — Ambulatory Visit: Payer: Medicare Other | Admitting: Radiation Oncology

## 2021-03-21 ENCOUNTER — Ambulatory Visit
Admission: RE | Admit: 2021-03-21 | Discharge: 2021-03-21 | Disposition: A | Discharge status: Home / Self Care | Payer: Medicare Other | Source: Ambulatory Visit | Attending: Radiation Oncology | Admitting: Radiation Oncology

## 2021-03-21 DIAGNOSIS — Z170 Estrogen receptor positive status [ER+]: Secondary | ICD-10-CM | POA: Diagnosis not present

## 2021-03-21 DIAGNOSIS — C50312 Malignant neoplasm of lower-inner quadrant of left female breast: Secondary | ICD-10-CM | POA: Diagnosis not present

## 2021-03-23 DIAGNOSIS — C50312 Malignant neoplasm of lower-inner quadrant of left female breast: Secondary | ICD-10-CM | POA: Diagnosis not present

## 2021-03-23 DIAGNOSIS — Z170 Estrogen receptor positive status [ER+]: Secondary | ICD-10-CM | POA: Diagnosis not present

## 2021-03-24 ENCOUNTER — Ambulatory Visit
Admission: RE | Admit: 2021-03-24 | Discharge: 2021-03-24 | Disposition: A | Discharge status: Home / Self Care | Payer: Medicare Other | Source: Ambulatory Visit | Attending: Radiation Oncology | Admitting: Radiation Oncology

## 2021-03-24 ENCOUNTER — Other Ambulatory Visit: Payer: Self-pay

## 2021-03-24 DIAGNOSIS — C50312 Malignant neoplasm of lower-inner quadrant of left female breast: Secondary | ICD-10-CM | POA: Diagnosis not present

## 2021-03-24 DIAGNOSIS — Z170 Estrogen receptor positive status [ER+]: Secondary | ICD-10-CM | POA: Diagnosis not present

## 2021-03-25 ENCOUNTER — Ambulatory Visit: Payer: Medicare Other | Admitting: Radiation Oncology

## 2021-03-25 ENCOUNTER — Ambulatory Visit
Admission: RE | Admit: 2021-03-25 | Discharge: 2021-03-25 | Disposition: A | Discharge status: Home / Self Care | Payer: Medicare Other | Source: Ambulatory Visit | Attending: Radiation Oncology | Admitting: Radiation Oncology

## 2021-03-25 DIAGNOSIS — Z170 Estrogen receptor positive status [ER+]: Secondary | ICD-10-CM | POA: Diagnosis not present

## 2021-03-25 DIAGNOSIS — C50312 Malignant neoplasm of lower-inner quadrant of left female breast: Secondary | ICD-10-CM | POA: Diagnosis not present

## 2021-03-26 ENCOUNTER — Ambulatory Visit
Admission: RE | Admit: 2021-03-26 | Discharge: 2021-03-26 | Disposition: A | Discharge status: Home / Self Care | Payer: Medicare Other | Source: Ambulatory Visit | Attending: Radiation Oncology | Admitting: Radiation Oncology

## 2021-03-26 ENCOUNTER — Other Ambulatory Visit: Payer: Self-pay

## 2021-03-26 DIAGNOSIS — H04123 Dry eye syndrome of bilateral lacrimal glands: Secondary | ICD-10-CM | POA: Diagnosis not present

## 2021-03-26 DIAGNOSIS — H401412 Capsular glaucoma with pseudoexfoliation of lens, right eye, moderate stage: Secondary | ICD-10-CM | POA: Diagnosis not present

## 2021-03-26 DIAGNOSIS — Z170 Estrogen receptor positive status [ER+]: Secondary | ICD-10-CM | POA: Diagnosis not present

## 2021-03-26 DIAGNOSIS — C50312 Malignant neoplasm of lower-inner quadrant of left female breast: Secondary | ICD-10-CM | POA: Diagnosis not present

## 2021-03-27 ENCOUNTER — Ambulatory Visit
Admission: RE | Admit: 2021-03-27 | Discharge: 2021-03-27 | Disposition: A | Discharge status: Home / Self Care | Payer: Medicare Other | Source: Ambulatory Visit | Attending: Radiation Oncology | Admitting: Radiation Oncology

## 2021-03-27 DIAGNOSIS — M79641 Pain in right hand: Secondary | ICD-10-CM | POA: Diagnosis not present

## 2021-03-27 DIAGNOSIS — Z170 Estrogen receptor positive status [ER+]: Secondary | ICD-10-CM | POA: Diagnosis not present

## 2021-03-27 DIAGNOSIS — C50312 Malignant neoplasm of lower-inner quadrant of left female breast: Secondary | ICD-10-CM | POA: Diagnosis not present

## 2021-03-27 DIAGNOSIS — M79642 Pain in left hand: Secondary | ICD-10-CM | POA: Diagnosis not present

## 2021-03-28 ENCOUNTER — Other Ambulatory Visit: Payer: Self-pay

## 2021-03-28 ENCOUNTER — Ambulatory Visit
Admission: RE | Admit: 2021-03-28 | Discharge: 2021-03-28 | Disposition: A | Discharge status: Home / Self Care | Payer: Medicare Other | Source: Ambulatory Visit | Attending: Radiation Oncology | Admitting: Radiation Oncology

## 2021-03-28 DIAGNOSIS — C50312 Malignant neoplasm of lower-inner quadrant of left female breast: Secondary | ICD-10-CM

## 2021-03-28 DIAGNOSIS — Z170 Estrogen receptor positive status [ER+]: Secondary | ICD-10-CM | POA: Diagnosis not present

## 2021-03-28 MED ORDER — RADIAPLEXRX EX GEL
Freq: Once | CUTANEOUS | Status: AC
Start: 2021-03-28 — End: 2021-03-28

## 2021-03-28 NOTE — Progress Notes (Signed)
  Radiation Oncology         [phone]) [phone] ________________________________  Name: Catherine Coleman MRN: [id number]  Date: 03/19/2021  DOB: 05/24/1932  SIMULATION NOTE   NARRATIVE:  The patient underwent simulation today for ongoing radiation therapy.  The existing CT study set was employed for the purpose of virtual treatment planning.  The target and avoidance structures were reviewed and modified as necessary.  Treatment planning then occurred.  The radiation boost prescription was entered and confirmed.  A total of 1 complex treatment devices were fabricated in the form of an en face electron block/ field to shape radiation around the targets while maximally excluding nearby normal structures. I have requested : Isodose Plan.    PLAN:  This modified radiation beam arrangement is intended to continue the current radiation dose to an additional 8 Gy in 4 fractions for a total cumulative dose of 50.56 Gy.    ------------------------------------------------  Jodelle Gross, MD, PhD

## 2021-03-31 ENCOUNTER — Other Ambulatory Visit: Payer: Self-pay

## 2021-03-31 ENCOUNTER — Ambulatory Visit
Admission: RE | Admit: 2021-03-31 | Discharge: 2021-03-31 | Disposition: A | Discharge status: Home / Self Care | Payer: Medicare Other | Source: Ambulatory Visit | Attending: Radiation Oncology | Admitting: Radiation Oncology

## 2021-03-31 DIAGNOSIS — Z170 Estrogen receptor positive status [ER+]: Secondary | ICD-10-CM | POA: Diagnosis not present

## 2021-03-31 DIAGNOSIS — C50312 Malignant neoplasm of lower-inner quadrant of left female breast: Secondary | ICD-10-CM | POA: Diagnosis not present

## 2021-04-01 ENCOUNTER — Encounter: Payer: Self-pay | Admitting: *Deleted

## 2021-04-01 ENCOUNTER — Ambulatory Visit
Admission: RE | Admit: 2021-04-01 | Discharge: 2021-04-01 | Disposition: A | Discharge status: Home / Self Care | Payer: Medicare Other | Source: Ambulatory Visit | Attending: Radiation Oncology | Admitting: Radiation Oncology

## 2021-04-01 DIAGNOSIS — Z170 Estrogen receptor positive status [ER+]: Secondary | ICD-10-CM | POA: Diagnosis not present

## 2021-04-01 DIAGNOSIS — C50312 Malignant neoplasm of lower-inner quadrant of left female breast: Secondary | ICD-10-CM

## 2021-04-02 ENCOUNTER — Encounter: Payer: Self-pay | Admitting: Radiation Oncology

## 2021-04-02 ENCOUNTER — Ambulatory Visit
Admission: RE | Admit: 2021-04-02 | Discharge: 2021-04-02 | Disposition: A | Discharge status: Home / Self Care | Payer: Medicare Other | Source: Ambulatory Visit | Attending: Radiation Oncology | Admitting: Radiation Oncology

## 2021-04-02 DIAGNOSIS — C50312 Malignant neoplasm of lower-inner quadrant of left female breast: Secondary | ICD-10-CM | POA: Diagnosis not present

## 2021-04-02 DIAGNOSIS — Z170 Estrogen receptor positive status [ER+]: Secondary | ICD-10-CM | POA: Diagnosis not present

## 2021-04-09 NOTE — Progress Notes (Signed)
                                                                                                                                                             Patient Name: Catherine Coleman MRN: [id number] DOB: June 14, 1932 Referring Physician: Penni Homans (Profile Not Attached) Date of Service: 04/02/2021 [facility], [place]                                                        End Of Treatment Note  Diagnoses: C50.312-Malignant neoplasm of lower-inner quadrant of left female breast  Cancer Staging: Stage IA, pT1b,cN0M0 grade 2, ER/PR positive invasive ductal carcinoma of the left breast.  Intent: Curative  Radiation Treatment Dates: 03/05/2021 through 04/02/2021 Site Technique Total Dose (Gy) Dose per Fx (Gy) Completed Fx Beam Energies  Breast, Left: Breast_Lt 3D 42.56/42.56 2.66 16/16 10X  Breast, Left: Breast_Lt_Bst specialPort 8/8 2 4/4 9E, 12E   Narrative: The patient tolerated radiation therapy relatively well. She developed moderate dermatitis without desquamation.  Plan: The patient will receive a call in about one month from the radiation oncology department. She will continue follow up with Dr. Jana Hakim as well.   ________________________________________________    Carola Rhine, [facility]

## 2021-04-28 ENCOUNTER — Ambulatory Visit: Payer: Medicare Other | Admitting: Family Medicine

## 2021-05-08 ENCOUNTER — Other Ambulatory Visit: Payer: Self-pay | Admitting: Family Medicine

## 2021-05-12 ENCOUNTER — Ambulatory Visit
Admission: RE | Admit: 2021-05-12 | Discharge: 2021-05-12 | Disposition: A | Discharge status: Home / Self Care | Payer: Medicare Other | Source: Ambulatory Visit | Attending: Radiation Oncology | Admitting: Radiation Oncology

## 2021-05-12 DIAGNOSIS — Z170 Estrogen receptor positive status [ER+]: Secondary | ICD-10-CM

## 2021-05-12 NOTE — Progress Notes (Signed)
  Radiation Oncology         [phone] ________________________________  Name: Hila Deanes MRN: [id number]  Date of Service: 05/12/2021  DOB: 03-11-1932  Post Treatment Telephone Note  Diagnosis:   Stage IA, pT1b,cN0M0 grade 2, ER/PR positive invasive ductal carcinoma of the left breast.  Interval Since Last Radiation:  6 weeks   03/05/2021 through 04/02/2021 Site Technique Total Dose (Gy) Dose per Fx (Gy) Completed Fx Beam Energies  Breast, Left: Breast_Lt 3D 42.56/42.56 2.66 16/16 10X  Breast, Left: Breast_Lt_Bst specialPort 8/8 2 4/4 9E, 12E    Narrative:  The patient was contacted today for routine follow-up. During treatment she did very well with radiotherapy and did not have significant desquamation. She reports she is doing very well and her skin is fully healed. She denies any concerns at this time.  Impression/Plan: 1. Stage IA, pT1b,cN0M0 grade 2, ER/PR positive invasive ductal carcinoma of the left breast.. The patient has been doing well since completion of radiotherapy. We discussed that we would be happy to continue to follow her as needed, but she will also continue to follow up with Dr. Jana Hakim in medical oncology. She was counseled on skin care as well as measures to avoid sun exposure to this area.  2. Survivorship. We discussed the importance of survivorship evaluation and encouraged her to attend her upcoming visit with that clinic.     Carola Rhine, PAC

## 2021-05-30 DIAGNOSIS — M47816 Spondylosis without myelopathy or radiculopathy, lumbar region: Secondary | ICD-10-CM | POA: Diagnosis not present

## 2021-05-30 DIAGNOSIS — M461 Sacroiliitis, not elsewhere classified: Secondary | ICD-10-CM | POA: Diagnosis not present

## 2021-05-30 DIAGNOSIS — R269 Unspecified abnormalities of gait and mobility: Secondary | ICD-10-CM | POA: Diagnosis not present

## 2021-06-01 NOTE — Progress Notes (Signed)
[facility]  [phone] [phone]     ID: Catherine Coleman DOB: Jan 19, 1932  MR#: [id number]  KMQ#:[id number]  Patient Care Team: [name], MD as PCP - General (Family Medicine) Mauro Kaufmann, RN as Oncology Nurse Navigator Rockwell Germany, RN as Oncology Nurse Navigator Ronni Osterberg, Virgie Dad, MD as Consulting Physician (Oncology) [name], MD as Consulting Physician (General Surgery) Kyung Rudd, MD as Consulting Physician (Radiation Oncology) Damian Leavell, MD as Consulting Physician (Obstetrics and Gynecology) Zonia Kief, MD as Consulting Physician (Rehabilitation) Ulla Gallo, MD as Consulting Physician (Dermatology) Cherre Robins, PharmD (Pharmacist) Chauncey Cruel, MD OTHER MD:  CHIEF COMPLAINT: Estrogen receptor positive breast cancer  CURRENT TREATMENT: anastrozole   INTERVAL HISTORY: Catherine Coleman returns today for follow up of her estrogen receptor positive breast cancer.   Since her last visit, she completed adjuvant radiation therapy on 04/02/2021.  She also started anastrozole on 04/28/2021.  She is tolerating this remarkably well.  She is not having hot flashes or any other side effects that she is aware of at this point.  She is obtaining it at a good price   REVIEW OF SYSTEMS: Catherine Coleman does water aerobics frequently as her main form of exercise.  She has severe back pain problems and is going to be seeing a spine specialist regarding this.  For that reason she does not walk long distances.  She has had no falls however which is good.  She does use walking poles when walking outside.  Aside from these issues a detailed review of systems today was stable   COVID 19 VACCINATION STATUS: Status post Moderna x2 with Moderna then [employer] boosters   HISTORY OF CURRENT ILLNESS: From the original intake note:  Catherine Coleman had routine screening mammography on 11/13/2020 showing a possible abnormality in the  bilateral breasts. She underwent bilateral diagnostic mammography with tomography and left breast ultrasonography at [facility] on 12/02/2020 showing: breast density category B; 10 mm left breast mass at 7 o'clock; no suspicious left axillary lymphadenopathy; tow groups of indeterminate calcifications involving upper-inner and upper-outer right breast, each group spanning up to 6 cm.  Accordingly on 12/11/2020 she proceeded to biopsy of the bilateral breast areas in question. The pathology from this procedure ([id number]) showed:  1. Left Breast  - invasive ductal carcinoma, grade 2.   - Prognostic indicators significant for: estrogen receptor, 95% positive and progesterone receptor, 60% positive, both with strong staining intensity. Proliferation marker Ki67 at 15%. HER2 equivocal by immunohistochemistry (2+), but negative by fluorescent in situ hybridization with a signals ratio 1.34 and number per cell 1.95. 2. Right Breast  - fat necrosis and dystrophic calcifications  Cancer Staging Malignant neoplasm of lower-inner quadrant of left breast in female, estrogen receptor positive ([place]) Staging form: Breast, AJCC 8th Edition - Clinical stage from 12/18/2020: Stage IA (cT1b, cN0, cM0, G2, ER+, PR+, HER2-) - Signed by Chauncey Cruel, MD on 12/18/2020 Stage prefix: Initial diagnosis Histologic grading system: 3 grade system  The patient's subsequent history is as detailed below.   PAST MEDICAL HISTORY: Past Medical History:  Diagnosis Date   Arthritis    Atrophic rhinitis 04/21/2016   Breast cancer ([place]) 12/2020   left breast IDC   Constipation    Decreased hearing    Diverticulitis    Dry eyes    Fatigue    Floaters in visual field    Gall bladder disease    GERD (gastroesophageal reflux disease)  Glaucoma    Hiatal hernia with gastroesophageal reflux    History of blood transfusion    for Knee replacement   Hyperlipidemia, mild 04/02/2016   Hypertension    Hypothyroidism     Joint pain    Migraine aura without headache    Muscle pain    Muscle stiffness    Nasal congestion    Obesity 04/02/2016   Osteoarthritis    Parotiditis 04/21/2016   Vitamin D deficiency 04/02/2016    PAST SURGICAL HISTORY: Past Surgical History:  Procedure Laterality Date   APPENDECTOMY     BREAST LUMPECTOMY WITH RADIOACTIVE SEED LOCALIZATION Left 01/21/2021   Procedure: LEFT BREAST LUMPECTOMY WITH RADIOACTIVE SEED LOCALIZATION;  Surgeon: [name], MD;  Location: [facility];  Service: General;  Laterality: Left;   cataract surgery  2008   CHOLECYSTECTOMY  2010   JOINT REPLACEMENT Left    2012   MASTOIDECTOMY     POSTERIOR CERVICAL FUSION/FORAMINOTOMY N/A 04/27/2017   Procedure: CERVICAL FIVE-SIX  OPEN REDUCTION OF FRACTURE, CERVICAL THREE-SEVEN  DORSAL FIXATION AND FUSION;  Surgeon: Ditty, Kevan Ny, MD;  Location: [facility];  Service: Neurosurgery;  Laterality: N/A;   SHOULDER ARTHROSCOPY Bilateral prior to 2010   [place] Right 03/22/2015   Procedure: RIGHT TOTAL HIP ARTHROPLASTY ANTERIOR APPROACH;  Surgeon: Dorna Leitz, MD;  Location: [facility];  Service: Orthopedics;  Laterality: Right;   TOTAL KNEE ARTHROPLASTY Bilateral 2007   TUBAL LIGATION      FAMILY HISTORY: Family History  Problem Relation Age of Onset   Dementia Mother    Hypertension Mother    Obesity Mother    Heart disease Father    Hypertension Father    Stroke Father    Diabetes Father    Hyperlipidemia Father    Alcoholism Father    Obesity Father    Arthritis Daughter    Cancer Maternal Grandfather        colon cancer   Her father died at age 7 from MI. Her mother died at age 4 from dementia. Catherine Coleman has one brother (and no sisters). She reports colon cancer in her maternal grandfather. There is no family history of breast or ovarian cancer to her knowledge.   GYNECOLOGIC HISTORY:  No LMP recorded. Patient is postmenopausal. Menarche: 87 years  old Age at first live birth: 87 years old [place] P 4 LMP date unsure, "57-90?" Contraceptive: used from [phone] HRT never used  Hysterectomy? no BSO? 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 She is widowed. She lives independently by herself at [facility]. Daughter Catherine Coleman, age 93, works with computers in [place], [place]. Daughter Catherine Coleman, age 37, is retired but Musician and lives in [place], [place]. Daughter Catherine Coleman, age 71, works in Scientist, research (life sciences) estate in [place], [place]. Daughter Catherine Coleman, age 42, works with computers in [place], [place]. Catherine Coleman and Catherine Coleman graduated from [employer]. Catherine Coleman has 5 grandchildren, two of whom are MD's.    ADVANCED DIRECTIVES: in place   HEALTH MAINTENANCE: Social History   Tobacco Use   Smoking status: Former   Smokeless tobacco: Never   Tobacco comments:    Stopped 50 years ago.   Vaping Use   Vaping Use: Never used  Substance Use Topics   Alcohol use: Yes    Alcohol/week: 0.0 standard drinks    Comment: socially 3-4x/wk   Drug use: No  Colonoscopy: 2014 (Dr. Stephens November), repeat not indicated (age)  PAP: date unknown  Bone density: 07/2019, -1.7   No Active Allergies   Current Outpatient Medications  Medication Sig Dispense Refill   anastrozole (ARIMIDEX) 1 MG tablet Take 1 tablet (1 mg total) by mouth daily. Start 04/28/2021 90 tablet 4   Biotin 10000 MCG TABS Take 1 tablet by mouth daily.     brimonidine (ALPHAGAN) 0.2 % ophthalmic solution Place 1 drop into both eyes 2 (two) times daily. 5 mL 12   famotidine (PEPCID) 40 MG tablet TAKE 1 TABLET BY MOUTH AT  BEDTIME AS NEEDED 90 tablet 1   levocetirizine (XYZAL) 5 MG tablet Take 1 tablet (5 mg total) by mouth every evening. 30 tablet 3   levothyroxine (SYNTHROID) 125 MCG tablet TAKE 1 TABLET BY MOUTH  DAILY 90 tablet 3   losartan (COZAAR) 50 MG tablet TAKE 1 TABLET BY MOUTH  DAILY  90 tablet 3   mirabegron ER (MYRBETRIQ) 50 MG TB24 tablet Take by mouth.     Multiple Vitamins-Minerals (MULTIVITAMIN WITH MINERALS) tablet Take 1 tablet by mouth daily.     omeprazole (PRILOSEC) 20 MG capsule TAKE 1 CAPSULE BY MOUTH  DAILY 90 capsule 3   polyethylene glycol (MIRALAX / GLYCOLAX) packet Take 17 g by mouth daily. 14 each 0   traMADol (ULTRAM) 50 MG tablet Take 1 tablet (50 mg total) by mouth every 6 (six) hours as needed for moderate pain or severe pain. 20 tablet 0   No current facility-administered medications for this visit.    OBJECTIVE: White woman who appears stated age  30:   06/02/21 1309  BP: (!) 167/84  Pulse: 84  Resp: 14  Temp: 97.7 F (36.5 C)  SpO2: 94%      Body mass index is 36.26 kg/m.   Wt Readings from Last 3 Encounters:  06/02/21 217 lb 14.4 oz (98.8 kg)  03/12/21 212 lb 3.2 oz (96.3 kg)  02/20/21 214 lb 3.2 oz (97.2 kg)      ECOG FS:1 - Symptomatic but completely ambulatory  Sclerae unicteric, EOMs intact Wearing a mask No cervical or supraclavicular adenopathy Lungs no rales or rhonchi Heart regular rate and rhythm Abd soft, nontender, positive bowel sounds MSK no focal spinal tenderness, no upper extremity lymphedema Neuro: nonfocal, well oriented, appropriate affect Breasts: The right breast is unremarkable.  The left breast is status postlumpectomy and radiation.  The cosmetic result is excellent.  There is no evidence of residual recurrent disease.  Both axillae are benign.   LAB RESULTS:  CMP     Component Value Date/Time   NA 138 12/18/2020 0807   NA 137 09/28/2018 1147   K 4.7 12/18/2020 0807   CL 100 12/18/2020 0807   CO2 25 12/18/2020 0807   GLUCOSE 134 (H) 12/18/2020 0807   BUN 14 12/18/2020 0807   BUN 9 09/28/2018 1147   CREATININE 0.75 12/18/2020 0807   CALCIUM 8.7 (L) 12/18/2020 0807   PROT 6.8 12/18/2020 0807   PROT 6.7 09/28/2018 1147   ALBUMIN 3.9 12/18/2020 0807   ALBUMIN 4.6 09/28/2018 1147   AST  18 12/18/2020 0807   ALT 19 12/18/2020 0807   ALKPHOS 105 12/18/2020 0807   BILITOT 0.4 12/18/2020 0807   GFRNONAA >60 12/18/2020 0807   GFRAA 94 09/28/2018 1147    No results found for: TOTALPROTELP, ALBUMINELP, A1GS, A2GS, BETS, BETA2SER, GAMS, MSPIKE, SPEI  Lab Results  Component Value Date   WBC 4.8 12/18/2020  NEUTROABS 2.8 12/18/2020   HGB 13.7 12/18/2020   HCT 41.6 12/18/2020   MCV 97.2 12/18/2020   PLT 213 12/18/2020    No results found for: LABCA2  No components found for: SELTRV202  No results for input(s): INR in the last 168 hours.  No results found for: LABCA2  No results found for: BXI356  No results found for: YSH683  No results found for: FGB021  No results found for: CA2729  No components found for: HGQUANT  No results found for: CEA1 / No results found for: CEA1   No results found for: AFPTUMOR  No results found for: CHROMOGRNA  No results found for: KPAFRELGTCHN, LAMBDASER, KAPLAMBRATIO (kappa/lambda light chains)  No results found for: HGBA, HGBA2QUANT, HGBFQUANT, HGBSQUAN (Hemoglobinopathy evaluation)   No results found for: LDH  No results found for: IRON, TIBC, IRONPCTSAT (Iron and TIBC)  No results found for: FERRITIN  Urinalysis    Component Value Date/Time   COLORURINE YELLOW 01/06/2021 1454   APPEARANCEUR [place] (A) 01/06/2021 1454   LABSPEC 1.015 01/06/2021 1454   PHURINE 6.0 01/06/2021 1454   GLUCOSEU NEGATIVE 01/06/2021 1454   HGBUR NEGATIVE 01/06/2021 1454   BILIRUBINUR NEGATIVE 01/06/2021 1454   KETONESUR NEGATIVE 01/06/2021 1454   PROTEINUR NEGATIVE 05/14/2017 0757   UROBILINOGEN 0.2 01/06/2021 1454   NITRITE POSITIVE (A) 01/06/2021 1454   LEUKOCYTESUR SMALL (A) 01/06/2021 1454    STUDIES: No results found.   ELIGIBLE FOR AVAILABLE RESEARCH PROTOCOL: no  ASSESSMENT: 87 y.o. [place] woman status post left breast lower inner quadrant biopsy 12/11/2020 for a clinical T1b N0, stage IA invasive ductal  carcinoma, grade 2, estrogen and progesterone receptor positive, HER-2 not amplified, with an MIB-1 of 15%  (1) status post left lumpectomy 01/21/2021 for a pT1c pNX, stage IA invasive ductal carcinoma, grade 2, with negative margins  (2) adjuvant radiation 03/05/2021 through 04/02/2021 Site Technique Total Dose (Gy) Dose per Fx (Gy) Completed Fx Beam Energies  Breast, Left: Breast_Lt 3D 42.56/42.56 2.66 16/16 10X  Breast, Left: Breast_Lt_Bst specialPort 8/8 2 4/4 9E, 12E   (3) to start anastrozole 04/28/2021  (A) bone density 07/17/2019 at the [facility] shows a T score of -1.7   PLAN: Catherine Coleman is now close to half a year out from definitive surgery for her breast cancer.  There is no evidence of residual recurrent disease.  She is a month into anastrozole and she is having no side effects whatsoever at this point.  I reassured her that not having side effects does not mean the drug is not effective.  The plan will be to continue that a total of 5 years.  She has mild to moderate osteopenia.  Unfortunately she is not able to do weightbearing exercise, like walking for example, because of her back.  Possibly once she gets the treatment she is planning on through the spine specialist she may be able to walk a little more.  In any case it would be prudent to repeat her bone density after she has been on anastrozole a year or so to assess whether she would benefit from additional bone building treatment  She will have mammography in March and see [place] again in April  She knows to call for any other issue that may develop before the next visit  Total encounter time 20 minutes.Sarajane Jews C. Jacari Kirsten, MD 06/02/2021 1:28 PM Medical Oncology and Hematology [facility] [place], [place] [address] Tel. [phone]  Fax. [phone]   This document serves as a record of services personally performed by Lurline Del, MD. It was created on his behalf by Wilburn Mylar, a trained medical scribe. The creation of this record is based on the scribe's personal observations and the provider's statements to them.   I, Lurline Del MD, have reviewed the above documentation for accuracy and completeness, and I agree with the above.   *Total Encounter Time as defined by the Centers for Medicare and Medicaid Services includes, in addition to the face-to-face time of a patient visit (documented in the note above) non-face-to-face time: obtaining and reviewing outside history, ordering and reviewing medications, tests or procedures, care coordination (communications with other health care professionals or caregivers) and documentation in the medical record.

## 2021-06-02 ENCOUNTER — Inpatient Hospital Stay: Payer: Medicare Other | Attending: Oncology | Admitting: Oncology

## 2021-06-02 ENCOUNTER — Other Ambulatory Visit: Payer: Self-pay

## 2021-06-02 VITALS — BP 167/84 | HR 84 | Temp 97.7°F | Resp 14 | Ht 65.0 in | Wt 217.9 lb

## 2021-06-02 DIAGNOSIS — M8580 Other specified disorders of bone density and structure, unspecified site: Secondary | ICD-10-CM | POA: Insufficient documentation

## 2021-06-02 DIAGNOSIS — F1010 Alcohol abuse, uncomplicated: Secondary | ICD-10-CM | POA: Diagnosis not present

## 2021-06-02 DIAGNOSIS — Z79811 Long term (current) use of aromatase inhibitors: Secondary | ICD-10-CM | POA: Insufficient documentation

## 2021-06-02 DIAGNOSIS — M549 Dorsalgia, unspecified: Secondary | ICD-10-CM | POA: Diagnosis not present

## 2021-06-02 DIAGNOSIS — C50312 Malignant neoplasm of lower-inner quadrant of left female breast: Secondary | ICD-10-CM | POA: Insufficient documentation

## 2021-06-02 DIAGNOSIS — Z170 Estrogen receptor positive status [ER+]: Secondary | ICD-10-CM | POA: Insufficient documentation

## 2021-06-02 DIAGNOSIS — Z87891 Personal history of nicotine dependence: Secondary | ICD-10-CM | POA: Diagnosis not present

## 2021-06-02 MED ORDER — ANASTROZOLE 1 MG PO TABS: 1.0000 mg | ORAL_TABLET | Freq: Every day | ORAL | 4 refills | Status: DC

## 2021-06-04 ENCOUNTER — Other Ambulatory Visit: Payer: Self-pay | Admitting: Family Medicine

## 2021-06-04 DIAGNOSIS — M47816 Spondylosis without myelopathy or radiculopathy, lumbar region: Secondary | ICD-10-CM | POA: Diagnosis not present

## 2021-06-04 DIAGNOSIS — E038 Other specified hypothyroidism: Secondary | ICD-10-CM

## 2021-06-06 ENCOUNTER — Telehealth: Payer: Self-pay | Admitting: Adult Health

## 2021-06-06 NOTE — Telephone Encounter (Signed)
Unable to reach patient about scheduling her survivorship care plan visit.  ? Connection difficulty.  Wilber Bihari, NP

## 2021-06-11 ENCOUNTER — Other Ambulatory Visit: Payer: Self-pay | Admitting: Family Medicine

## 2021-06-30 ENCOUNTER — Telehealth: Payer: Self-pay | Admitting: Pharmacist

## 2021-06-30 NOTE — Chronic Care Management (AMB) (Signed)
    Chronic Care Management Pharmacy Assistant   Name: Catherine Coleman  MRN: [id number] DOB: 1931-12-02  Reason for Encounter: Disease State  Recent office visits:  None Noted  Recent consult visits:  06/02/21 Oncology Lurline Del MD- pt was seen for returns today for follow up of her estrogen receptor positive breast cancer. PT will have mammography in March and follow up with Oncology again in April.   04/02/21 Oncology Shona Simpson PA-C- pt was seen for Stage IA, pT1b,cN0M0 grade 2, ER/PR positive invasive ductal carcinoma of the left breast. The patient will receive a call in about one month from the radiation oncology department. She will continue follow up with Dr. Jana Hakim as well.    04/01/21 Oncology Dawn Tressie Ellis RN- pt was seen for Malignant neoplasm of lower-inner quadrant of left breast in female, estrogen receptor positive ([place]). Referral for Survivorship program was placed.  04/01/21 Radiation/Chemo [place] 03/31/21 Radiation/Chemo [place] 03/28/21 Radiation/Chemo [place] 03/27/21 Radiation/Chemo [place]  03/26/21 Ophthalmology Andrez Grime MD- pt was seen for Glaucoma Evaluation. Pt advised to use Use Preservative free artificial tears 3-4 times a day or as needed for dryness or irritation and Keep next scheduled appointment, call sooner with any concerns.   03/25/21 Radiation/Chemo [place] 03/24/21 Radiation/Chemo [place] 03/19/21 Radiation/Chemo [place] 03/18/21 Radiation/Chemo [place] 03/14/21 Radiation/Chemo [place] 03/13/21 Radiation/Chemo [place] 03/12/21 Radiation/Chemo [place] 03/11/21 Radiation/Chemo [place] Hospital visits:  None in previous 6  months  Medications: Outpatient Encounter Medications as of 06/30/2021  Medication Sig   losartan (COZAAR) 50 MG tablet TAKE 1 TABLET BY MOUTH  DAILY   anastrozole (ARIMIDEX) 1 MG tablet Take 1 tablet (1 mg total) by mouth daily. Start 04/28/2021   Biotin 10000 MCG TABS Take 1 tablet by mouth daily.   brimonidine (ALPHAGAN) 0.2 % ophthalmic solution Place 1 drop into both eyes 2 (two) times daily.   famotidine (PEPCID) 40 MG tablet TAKE 1 TABLET BY MOUTH AT  BEDTIME AS NEEDED   levocetirizine (XYZAL) 5 MG tablet Take 1 tablet (5 mg total) by mouth every evening.   levothyroxine (SYNTHROID) 125 MCG tablet TAKE 1 TABLET BY MOUTH  DAILY   mirabegron ER (MYRBETRIQ) 50 MG TB24 tablet Take by mouth.   Multiple Vitamins-Minerals (MULTIVITAMIN WITH MINERALS) tablet Take 1 tablet by mouth daily.   omeprazole (PRILOSEC) 20 MG capsule TAKE 1 CAPSULE BY MOUTH  DAILY   polyethylene glycol (MIRALAX / GLYCOLAX) packet Take 17 g by mouth daily.   traMADol (ULTRAM) 50 MG tablet Take 1 tablet (50 mg total) by mouth every 6 (six) hours as needed for moderate pain or severe pain.   No facility-administered encounter medications on file as of 06/30/2021.   Have you had any problems recently with your health?   Have you had any problems with your pharmacy?   What issues or side effects are you having with your medications?   What would you like me to pass along to [employer], CPP for them to help you with?    What can we do to take care of you better?   Star Rating Drugs: losartan (COZAAR) 50 MG tablet last filled 04/17/2021 90 DS  [place] Clinical Pharmacist Assistant [phone]

## 2021-07-01 ENCOUNTER — Other Ambulatory Visit: Payer: Self-pay

## 2021-07-01 ENCOUNTER — Ambulatory Visit (INDEPENDENT_AMBULATORY_CARE_PROVIDER_SITE_OTHER): Payer: Medicare Other

## 2021-07-01 DIAGNOSIS — Z23 Encounter for immunization: Secondary | ICD-10-CM

## 2021-07-07 ENCOUNTER — Telehealth: Payer: Self-pay | Admitting: Pharmacist

## 2021-07-07 NOTE — Progress Notes (Signed)
Chronic Care Management Pharmacy Assistant   Name: Catherine Coleman  MRN: [id number] DOB: July 29, 1932   Reason for Encounter: Disease State   Conditions to be addressed/monitored: General    Recent office visits:  None ID  Recent consult visits:  06/02/21 Oncology Lurline Del MD- pt was seen for returns today for follow up of her estrogen receptor positive breast cancer. PT will have mammography in March and follow up with Oncology again in April.   04/02/21 Oncology Shona Simpson PA-C- pt was seen for Stage IA, pT1b,cN0M0 grade 2, ER/PR positive invasive ductal carcinoma of the left breast. The patient will receive a call in about one month from the radiation oncology department. She will continue follow up with Dr. Jana Hakim as well.   04/01/21 Oncology Dawn Tressie Ellis RN- pt was seen for Malignant neoplasm of lower-inner quadrant of left breast in female, estrogen receptor positive ([place]). Referral for Survivorship program was placed.   04/01/21 Radiation/Chemo [place] 03/31/21 Radiation/Chemo [place] 03/28/21 Radiation/Chemo [place] 03/27/21 Radiation/Chemo [place]   03/26/21 Ophthalmology Andrez Grime MD- pt was seen for Glaucoma Evaluation. Pt advised to use Use Preservative free artificial tears 3-4 times a day or as needed for dryness or irritation and Keep next scheduled appointment, call sooner with any concerns.    03/25/21 Radiation/Chemo [place] 03/24/21 Radiation/Chemo [place] 03/19/21 Radiation/Chemo [place] 03/18/21 Radiation/Chemo [place] 03/14/21 Radiation/Chemo [place] 03/13/21 Radiation/Chemo [place] 03/12/21 Radiation/Chemo [place] 03/11/21 Radiation/Chemo [place] Hospital visits:  None in previous 6 months  Medications: Outpatient Encounter Medications as of 07/07/2021  Medication Sig   losartan (COZAAR) 50 MG tablet TAKE 1 TABLET BY MOUTH  DAILY   anastrozole (ARIMIDEX) 1 MG tablet Take 1 tablet (1 mg total) by mouth daily. Start 04/28/2021   Biotin 10000 MCG TABS Take 1 tablet by mouth daily.   brimonidine (ALPHAGAN) 0.2 % ophthalmic solution Place 1 drop into both eyes 2 (two) times daily.   famotidine (PEPCID) 40 MG tablet TAKE 1 TABLET BY MOUTH AT  BEDTIME AS NEEDED   levocetirizine (XYZAL) 5 MG tablet Take 1 tablet (5 mg total) by mouth every evening.   levothyroxine (SYNTHROID) 125 MCG tablet TAKE 1 TABLET BY MOUTH  DAILY   mirabegron ER (MYRBETRIQ) 50 MG TB24 tablet Take by mouth.   Multiple Vitamins-Minerals (MULTIVITAMIN WITH MINERALS) tablet Take 1 tablet by mouth daily.   omeprazole (PRILOSEC) 20 MG capsule TAKE 1 CAPSULE BY MOUTH  DAILY   polyethylene glycol (MIRALAX / GLYCOLAX) packet Take 17 g by mouth daily.   traMADol (ULTRAM) 50 MG tablet Take 1 tablet (50 mg total) by mouth every 6 (six) hours as needed for moderate pain or severe pain.   No facility-administered encounter medications on file as of 07/07/2021.    Have you had any problems recently with your health? Patient states that she does not have any new health issues that Dr. [name] doesn't know about     Have you had any problems with your pharmacy? Patient states that she does not have any problems with getting medications from the pharmacy, but one med she gets for cancer is about $300.     What issues or side effects are you having with your medications?  Patient states that she does not have any side effects to medications but did have a reaction to the flu shot she received recently. She was having hot flashes at night but thinks that it has stopped     What would you like me to pass along to [employer], CPP for them to help you with? Patient states she is  doing ok     What can we do to take care of you better?Patient states no, not at this time   Star Rating Drugs: losartan (COZAAR) 50 MG tablet last filled 04/17/2021 90 DS  [facility] Clinical Pharmacist Assistant [phone]

## 2021-07-16 DIAGNOSIS — M461 Sacroiliitis, not elsewhere classified: Secondary | ICD-10-CM | POA: Diagnosis not present

## 2021-07-22 ENCOUNTER — Encounter: Payer: Self-pay | Admitting: Family Medicine

## 2021-07-22 ENCOUNTER — Ambulatory Visit (INDEPENDENT_AMBULATORY_CARE_PROVIDER_SITE_OTHER): Payer: Medicare Other | Admitting: Family Medicine

## 2021-07-22 ENCOUNTER — Other Ambulatory Visit: Payer: Self-pay

## 2021-07-22 VITALS — BP 110/62 | HR 98 | Temp 98.0°F | Resp 16 | Wt 214.0 lb

## 2021-07-22 DIAGNOSIS — Z170 Estrogen receptor positive status [ER+]: Secondary | ICD-10-CM | POA: Diagnosis not present

## 2021-07-22 DIAGNOSIS — E785 Hyperlipidemia, unspecified: Secondary | ICD-10-CM | POA: Diagnosis not present

## 2021-07-22 DIAGNOSIS — E559 Vitamin D deficiency, unspecified: Secondary | ICD-10-CM

## 2021-07-22 DIAGNOSIS — E038 Other specified hypothyroidism: Secondary | ICD-10-CM

## 2021-07-22 DIAGNOSIS — I10 Essential (primary) hypertension: Secondary | ICD-10-CM | POA: Diagnosis not present

## 2021-07-22 DIAGNOSIS — R739 Hyperglycemia, unspecified: Secondary | ICD-10-CM | POA: Diagnosis not present

## 2021-07-22 DIAGNOSIS — C50312 Malignant neoplasm of lower-inner quadrant of left female breast: Secondary | ICD-10-CM | POA: Diagnosis not present

## 2021-07-22 MED ORDER — ANASTROZOLE 1 MG PO TABS: 1.0000 mg | ORAL_TABLET | Freq: Every day | ORAL | 4 refills | Status: DC

## 2021-07-22 NOTE — Patient Instructions (Addendum)
If congestion try Flonase nasal spray  Wegovy and saxenda are the weight loss medications. Check with insurance whether wegovy or saxenda is covered. Kirke Shaggy is a pill that you take once daily and wegovy is a shot you take once weekly.  Hypothyroidism Hypothyroidism is when the thyroid gland does not make enough of certain hormones (it is underactive). The thyroid gland is a small gland located in the lower front part of the neck, just in front of the windpipe (trachea). This gland makes hormones that help control how the body uses food for energy (metabolism) as well as how the heart and brain function. These hormones also play a role in keeping your bones strong. When the thyroid is underactive, it produces too little of the hormones thyroxine (T4) and triiodothyronine (T3). What are the causes? This condition may be caused by: Hashimoto's disease. This is a disease in which the body's disease-fighting system (immune system) attacks the thyroid gland. This is the most common cause. Viral infections. Pregnancy. Certain medicines. Birth defects. Past radiation treatments to the head or neck for cancer. Past treatment with radioactive iodine. Past exposure to radiation in the environment. Past surgical removal of part or all of the thyroid. Problems with a gland in the center of the brain (pituitary gland). Lack of enough iodine in the diet. What increases the risk? You are more likely to develop this condition if: You are female. You have a family history of thyroid conditions. You use a medicine called lithium. You take medicines that affect the immune system (immunosuppressants). What are the signs or symptoms? Symptoms of this condition include: Feeling as though you have no energy (lethargy). Not being able to tolerate cold. Weight gain that is not explained by a change in diet or exercise habits. Lack of appetite. Dry skin. Coarse hair. Menstrual irregularity. Slowing of  thought processes. Constipation. Sadness or depression. How is this diagnosed? This condition may be diagnosed based on: Your symptoms, your medical history, and a physical exam. Blood tests. You may also have imaging tests, such as an ultrasound or MRI. How is this treated? This condition is treated with medicine that replaces the thyroid hormones that your body does not make. After you begin treatment, it may take several weeks for symptoms to go away. Follow these instructions at home: Take over-the-counter and prescription medicines only as told by your health care provider. If you start taking any new medicines, tell your health care provider. Keep all follow-up visits as told by your health care provider. This is important. As your condition improves, your dosage of thyroid hormone medicine may change. You will need to have blood tests regularly so that your health care provider can monitor your condition. Contact a health care provider if: Your symptoms do not get better with treatment. You are taking thyroid hormone replacement medicine and you: Sweat a lot. Have tremors. Feel anxious. Lose weight rapidly. Cannot tolerate heat. Have emotional swings. Have diarrhea. Feel weak. Get help right away if you have: Chest pain. An irregular heartbeat. A rapid heartbeat. Difficulty breathing. Summary Hypothyroidism is when the thyroid gland does not make enough of certain hormones (it is underactive). When the thyroid is underactive, it produces too little of the hormones thyroxine (T4) and triiodothyronine (T3). The most common cause is Hashimoto's disease, a disease in which the body's disease-fighting system (immune system) attacks the thyroid gland. The condition can also be caused by viral infections, medicine, pregnancy, or past radiation treatment to the head or  neck. Symptoms may include weight gain, dry skin, constipation, feeling as though you do not have energy, and not  being able to tolerate cold. This condition is treated with medicine to replace the thyroid hormones that your body does not make. This information is not intended to replace advice given to you by your health care provider. Make sure you discuss any questions you have with your health care provider. Document Revised: 05/08/2021 Document Reviewed: 05/16/2020 Elsevier Patient Education  2022 [employer].

## 2021-07-22 NOTE — Progress Notes (Signed)
Patient ID: Catherine Coleman, female    DOB: 06-Apr-1932  Age: 87 y.o. MRN: [id number]    Subjective:   Chief Complaint  Patient presents with   3 months follow up   Subjective   HPI Catherine Coleman presents for office visit today for follow up on HTN and primary osteoarthritis of right hip. She has c/o sinus HA's and pressure, to alleviate symptoms she takes OTC pain medications. She experiences a runny nose and sometimes congestion, however that might be due to her allergies. Denies CP/palp/SOB/HA/congestion/fevers/GI or GU c/o. Taking meds as prescribed.   Review of Systems  Constitutional:  Negative for chills, fatigue and fever.  HENT:  Negative for congestion, rhinorrhea, sinus pressure, sinus pain and sore throat.   Eyes:  Negative for pain.  Respiratory:  Negative for cough and shortness of breath.   Cardiovascular:  Negative for chest pain, palpitations and leg swelling.  Gastrointestinal:  Negative for abdominal pain, blood in stool, diarrhea, nausea and vomiting.  Genitourinary:  Negative for decreased urine volume, flank pain, frequency, vaginal bleeding and vaginal discharge.  Musculoskeletal:  Positive for arthralgias. Negative for back pain.  Neurological:  Negative for headaches.   History Past Medical History:  Diagnosis Date   Arthritis    Atrophic rhinitis 04/21/2016   Breast cancer ([place]) 12/2020   left breast IDC   Constipation    Decreased hearing    Diverticulitis    Dry eyes    Fatigue    Floaters in visual field    Gall bladder disease    GERD (gastroesophageal reflux disease)    Glaucoma    Hiatal hernia with gastroesophageal reflux    History of blood transfusion    for Knee replacement   Hyperlipidemia, mild 04/02/2016   Hypertension    Hypothyroidism    Joint pain    Migraine aura without headache    Muscle pain    Muscle stiffness    Nasal congestion    Obesity 04/02/2016   Osteoarthritis    Parotiditis 04/21/2016   Vitamin D deficiency  04/02/2016    She has a past surgical history that includes Total knee arthroplasty (Bilateral, 2007); Shoulder arthroscopy (Bilateral, prior to 2010); Joint replacement (Left); Cholecystectomy (2010); Appendectomy; Tonsillectomy; Total hip arthroplasty (Right, 03/22/2015); cataract surgery (2008); Tubal ligation; Posterior cervical fusion/foraminotomy (N/A, 04/27/2017); Mastoidectomy; and Breast lumpectomy with radioactive seed localization (Left, 01/21/2021).   Her family history includes Alcoholism in her father; Arthritis in her daughter; Cancer in her maternal grandfather; Dementia in her mother; Diabetes in her father; Heart disease in her father; Hyperlipidemia in her father; Hypertension in her father and mother; Obesity in her father and mother; Stroke in her father.She reports that she has quit smoking. She has never used smokeless tobacco. She reports current alcohol use. She reports that she does not use drugs.  Current Outpatient Medications on File Prior to Visit  Medication Sig Dispense Refill   brimonidine (ALPHAGAN) 0.2 % ophthalmic solution Place 1 drop into both eyes 2 (two) times daily. 5 mL 12   famotidine (PEPCID) 40 MG tablet TAKE 1 TABLET BY MOUTH AT  BEDTIME AS NEEDED 90 tablet 1   levocetirizine (XYZAL) 5 MG tablet Take 1 tablet (5 mg total) by mouth every evening. 30 tablet 3   levothyroxine (SYNTHROID) 125 MCG tablet TAKE 1 TABLET BY MOUTH  DAILY 90 tablet 0   losartan (COZAAR) 50 MG tablet TAKE 1 TABLET BY MOUTH  DAILY 90 tablet 1   mirabegron ER (MYRBETRIQ)  50 MG TB24 tablet Take by mouth.     Multiple Vitamins-Minerals (MULTIVITAMIN WITH MINERALS) tablet Take 1 tablet by mouth daily.     omeprazole (PRILOSEC) 20 MG capsule TAKE 1 CAPSULE BY MOUTH  DAILY 90 capsule 0   polyethylene glycol (MIRALAX / GLYCOLAX) packet Take 17 g by mouth daily. 14 each 0   traMADol (ULTRAM) 50 MG tablet Take 1 tablet (50 mg total) by mouth every 6 (six) hours as needed for moderate pain or  severe pain. 20 tablet 0   Biotin 10000 MCG TABS Take 1 tablet by mouth daily. (Patient not taking: Reported on 07/22/2021)     No current facility-administered medications on file prior to visit.     Objective:  Objective  Physical Exam Constitutional:      General: She is not in acute distress.    Appearance: Normal appearance. She is not ill-appearing or toxic-appearing.  HENT:     Head: Normocephalic and atraumatic.     Right Ear: Tympanic membrane, ear canal and external ear normal.     Left Ear: Tympanic membrane, ear canal and external ear normal.     Nose: No congestion or rhinorrhea.  Eyes:     Extraocular Movements: Extraocular movements intact.     Pupils: Pupils are equal, round, and reactive to light.  Cardiovascular:     Rate and Rhythm: Normal rate and regular rhythm.     Pulses: Normal pulses.     Heart sounds: Normal heart sounds. No murmur heard. Pulmonary:     Effort: Pulmonary effort is normal. No respiratory distress.     Breath sounds: Normal breath sounds. No wheezing, rhonchi or rales.  Abdominal:     General: Bowel sounds are normal.     Palpations: Abdomen is soft. There is no mass.     Tenderness: There is no abdominal tenderness. There is no guarding.     Hernia: No hernia is present.  Musculoskeletal:        General: Normal range of motion.     Cervical back: Normal range of motion and neck supple.  Skin:    General: Skin is warm and dry.  Neurological:     Mental Status: She is alert and oriented to person, place, and time.  Psychiatric:        Behavior: Behavior normal.   BP 110/62   Pulse 98   Temp 98 F (36.7 C)   Resp 16   Wt 214 lb (97.1 kg)   SpO2 96%   BMI 35.61 kg/m  Wt Readings from Last 3 Encounters:  07/22/21 214 lb (97.1 kg)  06/02/21 217 lb 14.4 oz (98.8 kg)  03/12/21 212 lb 3.2 oz (96.3 kg)     Lab Results  Component Value Date   WBC 5.5 07/22/2021   HGB 13.5 07/22/2021   HCT 40.5 07/22/2021   PLT 215.0  07/22/2021   GLUCOSE 119 (H) 07/22/2021   CHOL 189 07/22/2021   TRIG 183.0 (H) 07/22/2021   HDL 55.80 07/22/2021   LDLDIRECT 138.0 05/15/2019   LDLCALC 96 07/22/2021   ALT 17 07/22/2021   AST 17 07/22/2021   NA 135 07/22/2021   K 4.3 07/22/2021   CL 101 07/22/2021   CREATININE 0.79 07/22/2021   BUN 22 07/22/2021   CO2 25 07/22/2021   TSH 1.78 07/22/2021   INR 1.05 04/28/2017   HGBA1C 6.7 (H) 07/22/2021    No results found.   Assessment & Plan:  Plan  Meds ordered this encounter  Medications   anastrozole (ARIMIDEX) 1 MG tablet    Sig: Take 1 tablet (1 mg total) by mouth daily. Start 04/28/2021    Dispense:  90 tablet    Refill:  4    Problem List Items Addressed This Visit     Hypertension    Well controlled, no changes to meds. Encouraged heart healthy diet such as the DASH diet and exercise as tolerated.       Relevant Orders   CBC (Completed)   Comprehensive metabolic panel (Completed)   TSH (Completed)   Hypothyroidism - Primary   Vitamin D deficiency    Supplement and monitor      Relevant Orders   VITAMIN D 25 Hydroxy (Vit-D Deficiency, Fractures) (Completed)   Hyperlipidemia, mild    Maintain heart healthy diet such as MIND or DASH diet, increase exercise, avoid trans fats, simple carbohydrates and processed foods, consider a krill or fish or flaxseed oil cap daily.       Relevant Orders   Lipid panel (Completed)   Hyperglycemia    hgba1c acceptable, minimize simple carbs. Increase exercise as tolerated.       Relevant Orders   Hemoglobin A1c (Completed)   Malignant neoplasm of lower-inner quadrant of left breast in female, estrogen receptor positive ([place])    Is following with oncology and is tolerating Arimidex      Relevant Medications   anastrozole (ARIMIDEX) 1 MG tablet    Follow-up: Return in about 6 months (around 01/19/2022) for annual exam.  I, Suezanne Jacquet, acting as a scribe for Penni Homans, MD, have documented all relevent  documentation on behalf of Penni Homans, MD, as directed by Penni Homans, MD while in the presence of Penni Homans, MD. DO:07/23/21.  I, [name], MD personally performed the services described in this documentation. All medical record entries made by the scribe were at my direction and in my presence. I have reviewed the chart and agree that the record reflects my personal performance and is accurate and complete

## 2021-07-23 LAB — LIPID PANEL
Cholesterol: 189 mg/dL (ref 0–200)
HDL: 55.8 mg/dL (ref >39.00)
LDL Cholesterol: 96 mg/dL (ref 0–99)
NonHDL: 132.78
Total CHOL/HDL Ratio: 3
Triglycerides: 183 mg/dL — ABNORMAL HIGH (ref 0.0–149.0)
VLDL: 36.6 mg/dL (ref 0.0–40.0)

## 2021-07-23 LAB — CBC
HCT: 40.5 % (ref 36.0–46.0)
Hemoglobin: 13.5 g/dL (ref 12.0–15.0)
MCHC: 33.2 g/dL (ref 30.0–36.0)
MCV: 96.1 fl (ref 78.0–100.0)
Platelets: 215 10*3/uL (ref 150.0–400.0)
RBC: 4.22 Mil/uL (ref 3.87–5.11)
RDW: 12.9 % (ref 11.5–15.5)
WBC: 5.5 10*3/uL (ref 4.0–10.5)

## 2021-07-23 LAB — COMPREHENSIVE METABOLIC PANEL
ALT: 17 U/L (ref 0–35)
AST: 17 U/L (ref 0–37)
Albumin: 4.4 g/dL (ref 3.5–5.2)
Alkaline Phosphatase: 104 U/L (ref 39–117)
BUN: 22 mg/dL (ref 6–23)
CO2: 25 mEq/L (ref 19–32)
Calcium: 9 mg/dL (ref 8.4–10.5)
Chloride: 101 mEq/L (ref 96–112)
Creatinine, Ser: 0.79 mg/dL (ref 0.40–1.20)
GFR: 67.25 mL/min (ref >60.00)
Glucose, Bld: 119 mg/dL — ABNORMAL HIGH (ref 70–99)
Potassium: 4.3 mEq/L (ref 3.5–5.1)
Sodium: 135 mEq/L (ref 135–145)
Total Bilirubin: 0.4 mg/dL (ref 0.2–1.2)
Total Protein: 6.8 g/dL (ref 6.0–8.3)

## 2021-07-23 LAB — HEMOGLOBIN A1C: Hgb A1c MFr Bld: 6.7 % — ABNORMAL HIGH (ref 4.6–6.5)

## 2021-07-23 LAB — VITAMIN D 25 HYDROXY (VIT D DEFICIENCY, FRACTURES): VITD: 34.28 ng/mL (ref 30.00–100.00)

## 2021-07-23 LAB — TSH: TSH: 1.78 u[IU]/mL (ref 0.35–5.50)

## 2021-07-23 NOTE — Assessment & Plan Note (Signed)
Supplement and monitor 

## 2021-07-23 NOTE — Assessment & Plan Note (Signed)
Maintain heart healthy diet such as MIND or DASH diet, increase exercise, avoid trans fats, simple carbohydrates and processed foods, consider a krill or fish or flaxseed oil cap daily.

## 2021-07-23 NOTE — Assessment & Plan Note (Signed)
Is following with oncology and is tolerating Arimidex

## 2021-07-23 NOTE — Assessment & Plan Note (Signed)
hgba1c acceptable, minimize simple carbs. Increase exercise as tolerated.  

## 2021-07-23 NOTE — Assessment & Plan Note (Signed)
Well controlled, no changes to meds. Encouraged heart healthy diet such as the DASH diet and exercise as tolerated.  °

## 2021-07-24 ENCOUNTER — Ambulatory Visit: Payer: Medicare Other | Admitting: Family Medicine

## 2021-08-22 ENCOUNTER — Ambulatory Visit: Payer: Medicare Other

## 2021-08-22 DIAGNOSIS — N3941 Urge incontinence: Secondary | ICD-10-CM

## 2021-08-22 DIAGNOSIS — M85839 Other specified disorders of bone density and structure, unspecified forearm: Secondary | ICD-10-CM

## 2021-08-22 DIAGNOSIS — R739 Hyperglycemia, unspecified: Secondary | ICD-10-CM

## 2021-08-22 DIAGNOSIS — I10 Essential (primary) hypertension: Secondary | ICD-10-CM

## 2021-08-22 DIAGNOSIS — E785 Hyperlipidemia, unspecified: Secondary | ICD-10-CM

## 2021-08-25 NOTE — Patient Instructions (Addendum)
Catherine Coleman,  It was a pleasure speaking with you today.  I have attached a summary of our visit today and information about your health goals.   Our next appointment is by telephone on Jan 16, 2022 at 10:15am  Please call the care guide team at [phone] if you need to cancel or reschedule your appointment.    If you have any questions or concerns, please feel free to contact me either at the phone number below or with a MyChart message.   Keep up the good work!  Cherre Robins, PharmD Clinical Pharmacist [facility] [phone] (direct line)  [phone] (main office number)   Patient verbalizes understanding of instructions provided today and agrees to view in [place].    CARE PLAN ENTRY (see longitudinal plan of care for additional care plan information)  Current Barriers:  Chronic Disease Management support, education, and care coordination needs related to Hypertension, Hyperlipidemia, Pre-Diabetes, Hypothyroidism, GERD, Osteopenia, Glaucoma   Hypertension BP Readings from Last 3 Encounters:  07/22/21 110/62  06/02/21 (!) 167/84  03/12/21 140/82   Pharmacist Clinical Goal(s): Over the next 180 days, patient will work with PharmD and providers to achieve BP goal <140/90 Current regimen:  Losartan 50mg  daily Patient self care activities - Over the next 180 days, patient will: Recommend checking blood pressure 1 to 2 times per wek, document, and provide at future appointments Ensure daily salt intake < 2300 mg/day  Hyperlipidemia Lab Results  Component Value Date/Time   LDLCALC 96 07/22/2021 04:49 PM   LDLCALC 134 (H) 09/28/2018 11:47 AM   LDLDIRECT 138.0 05/15/2019 10:22 AM   Pharmacist Clinical Goal(s): Over the next 180 days, patient will work with PharmD and providers to maintain LDL goal < 100 or not have further increase in LDL Current regimen:  Diet and exercise management   Patient self care activities - Over the next  180 days, patient will: Work to limit intake of saturated and trans fat in diet to help reduce LDL - great job your LDL is now at goal!  Pre-Diabetes Lab Results  Component Value Date/Time   HGBA1C 6.7 (H) 07/22/2021 04:49 PM   HGBA1C 6.2 10/09/2020 09:39 AM   Pharmacist Clinical Goal(s): Over the next 180 days, patient will work with PharmD and providers to achieve A1c goal <6.5% Current regimen:  Diet and exercise management   Patient self care activities - Over the next 180 days, patient will:  Discussed limiting intake of sugar and carbohydrate containing foods; Discussed recommended serving size of 1/2 cup for potatoes, rice, pasta and corn. Reviewed A1c goal   GERD / acid reflux Pharmacist Clinical Goal(s) Over the next 180 days, patient will work with PharmD and providers to reduce symptoms of GERD / acid reflux Current regimen:  Famotidine 40mg  daily at bedtime  Omeprazole 20mg  daily Patient self care activities - Over the next 180 days, patient will: Continue current medication therapy  Osteopenia Pharmacist Clinical Goal(s) Over the next  days, patient will work with PharmD and providers to reduce risk of fracture due to osteopenia Current regimen:  None Interventions: Discussed goal of 1200mg  of calcium daily through diet and/or supplementation Discussed goal of 1000 units of vitamin D through supplementation Patient self care activities - Over the next 180 days, patient will: Ensure 1200mg  of calcium daily through diet and /or supplementation Tamoxifen and similar medications can decrease your bone density;  Discussed Dr Magrinat's recommendation to check DEXA (measure of bone density) after 1  year of anastrozole therapy.  Hypothyroidism:  Pharmacist Clinical Goal(s) Over the next  days, patient will work with PharmD and providers to maintain normal thyroid hormone levels through supplementation Current regimen:  Levotyroxine 153mcg - take 1 tablet daily in  morning Interventions: Recommended hold Biotin 1 week prior to testing of thyroid function Patient self care activities - Over the next 180 days, patient will: Recommend holding Biotin 1 week prior to testing of thyroid function Take calcium supplement and Multi-vitamin at least 4 hours after levothyroxine dose; recommend move multivitamin to either lunch time  or evening.  Medication management Pharmacist Clinical Goal(s): Over the next 90 days, patient will work with PharmD and providers to maintain optimal medication adherence Current pharmacy: [facility] Interventions Comprehensive medication review performed. Continue current medication management strategy Patient self care activities - Over the next 180 days, patient will: Focus on medication adherence by filling and taking medications appropriately  Take medications as prescribed Report any questions or concerns to PharmD and/or provider(s)  Patient requested list of alternatives to Myrbetriq; Reviewed her formulary for 2023:  Darifenacin / Enablex - not on formulary Fesoterodine / Toviaz - not on formulary Oxybutynin ER and IR - $8 (but can have effects on memory, cause dry mouth and increase constipation Solifenacin / Vesicare - $47 Tolterodine / Detrol [place] - $100 Trospium / [place] - $47 Myrbetriq - $47 Gentesa - not on formulary
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
## 2021-08-28 ENCOUNTER — Other Ambulatory Visit: Payer: Self-pay | Admitting: Family Medicine

## 2021-08-28 DIAGNOSIS — E038 Other specified hypothyroidism: Secondary | ICD-10-CM

## 2021-08-28 DIAGNOSIS — H401434 Capsular glaucoma with pseudoexfoliation of lens, bilateral, indeterminate stage: Secondary | ICD-10-CM | POA: Diagnosis not present

## 2021-08-28 DIAGNOSIS — H16223 Keratoconjunctivitis sicca, not specified as Sjogren's, bilateral: Secondary | ICD-10-CM | POA: Diagnosis not present

## 2021-08-28 DIAGNOSIS — H35362 Drusen (degenerative) of macula, left eye: Secondary | ICD-10-CM | POA: Diagnosis not present

## 2021-10-16 ENCOUNTER — Other Ambulatory Visit: Payer: Self-pay | Admitting: Hematology and Oncology

## 2021-10-16 ENCOUNTER — Other Ambulatory Visit: Payer: Self-pay | Admitting: Family Medicine

## 2021-10-16 ENCOUNTER — Other Ambulatory Visit: Payer: Self-pay | Admitting: *Deleted

## 2021-10-16 DIAGNOSIS — C50312 Malignant neoplasm of lower-inner quadrant of left female breast: Secondary | ICD-10-CM

## 2021-11-14 ENCOUNTER — Ambulatory Visit
Admission: RE | Admit: 2021-11-14 | Discharge: 2021-11-14 | Disposition: A | Discharge status: Home / Self Care | Payer: Medicare Other | Source: Ambulatory Visit | Attending: *Deleted | Admitting: *Deleted

## 2021-11-14 ENCOUNTER — Other Ambulatory Visit: Payer: Self-pay

## 2021-11-14 DIAGNOSIS — R922 Inconclusive mammogram: Secondary | ICD-10-CM | POA: Diagnosis not present

## 2021-11-14 DIAGNOSIS — C50312 Malignant neoplasm of lower-inner quadrant of left female breast: Secondary | ICD-10-CM

## 2021-11-14 DIAGNOSIS — Z853 Personal history of malignant neoplasm of breast: Secondary | ICD-10-CM | POA: Diagnosis not present

## 2021-11-17 ENCOUNTER — Telehealth: Payer: Self-pay | Admitting: Hematology and Oncology

## 2021-11-17 NOTE — Telephone Encounter (Signed)
Rescheduled appointment per 03/03 scheduled message, patient has been called and notified. ?

## 2021-11-28 ENCOUNTER — Other Ambulatory Visit: Payer: Self-pay | Admitting: Family Medicine

## 2021-12-04 DIAGNOSIS — L814 Other melanin hyperpigmentation: Secondary | ICD-10-CM | POA: Diagnosis not present

## 2021-12-04 DIAGNOSIS — L820 Inflamed seborrheic keratosis: Secondary | ICD-10-CM | POA: Diagnosis not present

## 2021-12-04 DIAGNOSIS — D1801 Hemangioma of skin and subcutaneous tissue: Secondary | ICD-10-CM | POA: Diagnosis not present

## 2021-12-04 DIAGNOSIS — L821 Other seborrheic keratosis: Secondary | ICD-10-CM | POA: Diagnosis not present

## 2021-12-09 DIAGNOSIS — M461 Sacroiliitis, not elsewhere classified: Secondary | ICD-10-CM | POA: Diagnosis not present

## 2021-12-09 DIAGNOSIS — M47816 Spondylosis without myelopathy or radiculopathy, lumbar region: Secondary | ICD-10-CM | POA: Diagnosis not present

## 2021-12-16 DIAGNOSIS — M461 Sacroiliitis, not elsewhere classified: Secondary | ICD-10-CM | POA: Diagnosis not present

## 2021-12-17 ENCOUNTER — Encounter (HOSPITAL_COMMUNITY): Payer: Self-pay

## 2022-01-01 ENCOUNTER — Ambulatory Visit: Payer: Medicare Other | Admitting: Hematology and Oncology

## 2022-01-01 ENCOUNTER — Other Ambulatory Visit: Payer: Medicare Other

## 2022-01-02 ENCOUNTER — Inpatient Hospital Stay: Payer: Medicare Other | Admitting: Hematology and Oncology

## 2022-01-02 ENCOUNTER — Other Ambulatory Visit: Payer: Self-pay

## 2022-01-02 ENCOUNTER — Encounter: Payer: Self-pay | Admitting: Hematology and Oncology

## 2022-01-02 ENCOUNTER — Inpatient Hospital Stay: Payer: Medicare Other | Attending: Hematology and Oncology

## 2022-01-02 VITALS — BP 153/71 | HR 70 | Temp 97.5°F | Resp 18 | Wt 216.6 lb

## 2022-01-02 DIAGNOSIS — C50312 Malignant neoplasm of lower-inner quadrant of left female breast: Secondary | ICD-10-CM

## 2022-01-02 DIAGNOSIS — Z79811 Long term (current) use of aromatase inhibitors: Secondary | ICD-10-CM | POA: Insufficient documentation

## 2022-01-02 DIAGNOSIS — M8580 Other specified disorders of bone density and structure, unspecified site: Secondary | ICD-10-CM | POA: Insufficient documentation

## 2022-01-02 DIAGNOSIS — Z170 Estrogen receptor positive status [ER+]: Secondary | ICD-10-CM | POA: Insufficient documentation

## 2022-01-02 LAB — CMP (CANCER CENTER ONLY)
ALT: 19 U/L (ref 0–44)
AST: 20 U/L (ref 15–41)
Albumin: 4.1 g/dL (ref 3.5–5.0)
Alkaline Phosphatase: 107 U/L (ref 38–126)
Anion gap: 8 (ref 5–15)
BUN: 12 mg/dL (ref 8–23)
CO2: 30 mmol/L (ref 22–32)
Calcium: 9 mg/dL (ref 8.9–10.3)
Chloride: 101 mmol/L (ref 98–111)
Creatinine: 0.76 mg/dL (ref 0.44–1.00)
GFR, Estimated: >60 mL/min (ref >60)
Glucose, Bld: 131 mg/dL — ABNORMAL HIGH (ref 70–99)
Potassium: 4.5 mmol/L (ref 3.5–5.1)
Sodium: 139 mmol/L (ref 135–145)
Total Bilirubin: 0.3 mg/dL (ref 0.3–1.2)
Total Protein: 6.7 g/dL (ref 6.5–8.1)

## 2022-01-02 LAB — CBC WITH DIFFERENTIAL (CANCER CENTER ONLY)
Abs Immature Granulocytes: 0.03 10*3/uL (ref 0.00–0.07)
Basophils Absolute: 0 10*3/uL (ref 0.0–0.1)
Basophils Relative: 0 %
Eosinophils Absolute: 0.1 10*3/uL (ref 0.0–0.5)
Eosinophils Relative: 2 %
HCT: 38.6 % (ref 36.0–46.0)
Hemoglobin: 13 g/dL (ref 12.0–15.0)
Immature Granulocytes: 1 %
Lymphocytes Relative: 22 %
Lymphs Abs: 1 10*3/uL (ref 0.7–4.0)
MCH: 32.3 pg (ref 26.0–34.0)
MCHC: 33.7 g/dL (ref 30.0–36.0)
MCV: 96 fL (ref 80.0–100.0)
Monocytes Absolute: 0.5 10*3/uL (ref 0.1–1.0)
Monocytes Relative: 11 %
Neutro Abs: 3 10*3/uL (ref 1.7–7.7)
Neutrophils Relative %: 64 %
Platelet Count: 197 10*3/uL (ref 150–400)
RBC: 4.02 MIL/uL (ref 3.87–5.11)
RDW: 12.7 % (ref 11.5–15.5)
WBC Count: 4.7 10*3/uL (ref 4.0–10.5)
nRBC: 0 % (ref 0.0–0.2)

## 2022-01-02 MED ORDER — ANASTROZOLE 1 MG PO TABS: 1.0000 mg | ORAL_TABLET | Freq: Every day | ORAL | 4 refills | Status: DC

## 2022-01-02 NOTE — Progress Notes (Signed)
?[place]  ?[phone] [phone]  ? ? ? ?ID: Catherine Coleman DOB: 1932/02/17  MR#: [id number]  JYN#:[id number] ? ?Patient Care Team: ?Catherine Lukes, MD as PCP - General (Family Medicine) ?Catherine Kaufmann, RN as Oncology Nurse Navigator ?Catherine Germany, RN as Oncology Nurse Navigator ?Catherine Coleman, Catherine Dad, MD (Inactive) as Consulting Physician (Oncology) ?Catherine Keens, MD as Consulting Physician (General Surgery) ?Catherine Rudd, MD as Consulting Physician (Radiation Oncology) ?Catherine Leavell, MD as Consulting Physician (Obstetrics and Gynecology) ?Catherine Kief, MD as Consulting Physician (Rehabilitation) ?Catherine Gallo, MD as Consulting Physician (Dermatology) ?Catherine Coleman, RPH-CPP (Pharmacist) ?Catherine Pike, MD ?OTHER MD: ? ?CHIEF COMPLAINT: Estrogen receptor positive breast cancer ? ?CURRENT TREATMENT: anastrozole ? ? ?INTERVAL HISTORY: ? ?Catherine Coleman returns today for follow up of her estrogen receptor positive breast cancer.  ?Since her last visit, she completed adjuvant radiation therapy on 04/02/2021. ?She is tolerating anastrozole very well.   ?No hot flashes or vaginal dryness  ?Last mammogram March 2023 negative ?Rest of the pertinent 10 ROS reviewed and negative. ? ? ? COVID 19 VACCINATION STATUS: Status post Moderna x2 with Moderna then [employer] boosters ? ? ?HISTORY OF CURRENT ILLNESS: ?From the original intake note: ? ?Catherine Coleman had routine screening mammography on 11/13/2020 showing a possible abnormality in the bilateral breasts. She underwent bilateral diagnostic mammography with tomography and left breast ultrasonography at [facility] on 12/02/2020 showing: breast density category B; 10 mm left breast mass at 7 o'clock; no suspicious left axillary lymphadenopathy; tow groups of indeterminate calcifications involving upper-inner and upper-outer right breast, each group spanning up to 6 cm. ? ?Accordingly on 12/11/2020 she proceeded to biopsy of  the bilateral breast areas in question. The pathology from this procedure ([id number]) showed:  ?1. Left Breast ? - invasive ductal carcinoma, grade 2.  ? - Prognostic indicators significant for: estrogen receptor, 95% positive and progesterone receptor, 60% positive, both with strong staining intensity. Proliferation marker Ki67 at 15%. HER2 equivocal by immunohistochemistry (2+), but negative by fluorescent in situ hybridization with a signals ratio 1.34 and number per cell 1.95. ?2. Right Breast ? - fat necrosis and dystrophic calcifications ? ? Cancer Staging  ?Malignant neoplasm of lower-inner quadrant of left breast in female, estrogen receptor positive (Catherine Coleman) ?Staging form: Breast, AJCC 8th Edition ?- Clinical stage from 12/18/2020: Stage IA (cT1b, cN0, cM0, G2, ER+, PR+, HER2-) - Signed by Catherine Cruel, MD on 12/18/2020 ?Stage prefix: Initial diagnosis ?Histologic grading system: 3 grade system ? ? ?The patient's subsequent history is as detailed below. ? ? ?PAST MEDICAL HISTORY: ?Past Medical History:  ?Diagnosis Date  ? Arthritis   ? Atrophic rhinitis 04/21/2016  ? Breast cancer ([place]) 12/2020  ? left breast IDC  ? Constipation   ? Decreased hearing   ? Diverticulitis   ? Dry eyes   ? Fatigue   ? Floaters in visual field   ? Gall bladder disease   ? GERD (gastroesophageal reflux disease)   ? Glaucoma   ? Hiatal hernia with gastroesophageal reflux   ? History of blood transfusion   ? for Knee replacement  ? Hyperlipidemia, mild 04/02/2016  ? Hypertension   ? Hypothyroidism   ? Joint pain   ? Migraine aura without headache   ? Muscle pain   ? Muscle stiffness   ? Nasal congestion   ? Obesity 04/02/2016  ? Osteoarthritis   ? Parotiditis 04/21/2016  ? Vitamin D deficiency 04/02/2016  ? ? ?PAST SURGICAL HISTORY: ?  Past Surgical History:  ?Procedure Laterality Date  ? APPENDECTOMY    ? BREAST LUMPECTOMY WITH RADIOACTIVE SEED LOCALIZATION Left 01/21/2021  ? Procedure: LEFT BREAST LUMPECTOMY WITH RADIOACTIVE SEED  LOCALIZATION;  Surgeon: Catherine Keens, MD;  Location: [facility];  Service: General;  Laterality: Left;  ? cataract surgery  2008  ? CHOLECYSTECTOMY  2010  ? JOINT REPLACEMENT Left   ? 2012  ? MASTOIDECTOMY    ? POSTERIOR CERVICAL FUSION/FORAMINOTOMY N/A 04/27/2017  ? Procedure: CERVICAL FIVE-SIX  OPEN REDUCTION OF FRACTURE, CERVICAL THREE-SEVEN  DORSAL FIXATION AND FUSION;  Surgeon: Catherine Coleman, Catherine Ny, MD;  Location: [facility];  Service: Neurosurgery;  Laterality: N/A;  ? SHOULDER ARTHROSCOPY Bilateral prior to 2010  ? TONSILLECTOMY    ? TOTAL HIP ARTHROPLASTY Right 03/22/2015  ? Procedure: RIGHT TOTAL HIP ARTHROPLASTY ANTERIOR APPROACH;  Surgeon: Dorna Leitz, MD;  Location: [facility];  Service: Orthopedics;  Laterality: Right;  ? TOTAL KNEE ARTHROPLASTY Bilateral 2007  ? TUBAL LIGATION    ? ? ?FAMILY HISTORY: ?Family History  ?Problem Relation Age of Onset  ? Dementia Mother   ? Hypertension Mother   ? Obesity Mother   ? Heart disease Father   ? Hypertension Father   ? Stroke Father   ? Diabetes Father   ? Hyperlipidemia Father   ? Alcoholism Father   ? Obesity Father   ? Arthritis Daughter   ? Cancer Maternal Grandfather   ?     colon cancer  ? Her father died at age 4 from MI. Her mother died at age 74 from dementia. Catherine Coleman has one brother (and no sisters). She reports colon cancer in her maternal grandfather. There is no family history of breast or ovarian cancer to her knowledge. ? ? ?GYNECOLOGIC HISTORY:  ?No LMP recorded. Patient is postmenopausal. ?Menarche: 88 years old ?Age at first live birth: 88 years old ?GX P 4 ?LMP date unsure, "1985-90?" ?Contraceptive: used from [phone] ?HRT never used  ?Hysterectomy? no ?BSO? no ? ? 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 She is widowed. She lives independently by herself at [facility]. Daughter Catherine Coleman, age 68, works with computers in [place], [place]. Daughter  Catherine Coleman, age 73, is retired but Musician and lives in [place], [place]. Daughter Catherine Coleman, age 71, works in Scientist, research (life sciences) estate in [place], [place]. Daughter Royalty Fakhouri, age 61, works with computers in [place], [place]. Magda Paganini and Bressler graduated from [employer]. Jayliani has 5 grandchildren, two of whom are MD's. ?  ? ADVANCED DIRECTIVES: in place ? ? ?HEALTH MAINTENANCE: ?Social History  ? ?Tobacco Use  ? Smoking status: Former  ? Smokeless tobacco: Never  ? Tobacco comments:  ?  Stopped 50 years ago.   ?Vaping Use  ? Vaping Use: Never used  ?Substance Use Topics  ? Alcohol use: Yes  ?  Alcohol/week: 0.0 standard drinks  ?  Comment: socially 3-4x/wk  ? Drug use: No  ? ? ? Colonoscopy: 2014 (Dr. Stephens November), repeat not indicated (age) ? PAP: date unknown ? Bone density: 07/2019, -1.7 ?  ?No Active Allergies ? ? ?Current Outpatient Medications  ?Medication Sig Dispense Refill  ? anastrozole (ARIMIDEX) 1 MG tablet Take 1 tablet (1 mg total) by mouth daily. Start 04/28/2021 90 tablet 4  ? Biotin 10000 MCG TABS Take 1 tablet by mouth daily.    ? brimonidine (ALPHAGAN) 0.2 % ophthalmic solution Place 1 drop into both eyes 2 (  two) times daily. 5 mL 12  ? famotidine (PEPCID) 40 MG tablet TAKE 1 TABLET BY MOUTH AT  BEDTIME AS NEEDED 90 tablet 3  ? levocetirizine (XYZAL) 5 MG tablet Take 1 tablet (5 mg total) by mouth every evening. 30 tablet 3  ? levothyroxine (SYNTHROID) 125 MCG tablet TAKE 1 TABLET BY MOUTH  DAILY 90 tablet 3  ? losartan (COZAAR) 50 MG tablet TAKE 1 TABLET BY MOUTH  DAILY 90 tablet 3  ? MAGNESIUM-POTASSIUM PO Take 1 tablet by mouth daily.    ? mirabegron ER (MYRBETRIQ) 50 MG TB24 tablet Take 50 mg by mouth daily.    ? Multiple Vitamins-Minerals (MULTIVITAMIN WITH MINERALS) tablet Take 1 tablet by mouth daily.    ? omeprazole (PRILOSEC) 20 MG capsule TAKE 1 CAPSULE BY MOUTH  DAILY 90 capsule 3  ? polyethylene glycol (MIRALAX / GLYCOLAX) packet Take 17 g by mouth daily. (Patient taking differently:  Take 17 g by mouth daily as needed.) 14 each 0  ? Vitamin D, Cholecalciferol, 25 MCG (1000 UT) CAPS Take 1 capsule by mouth daily.    ? ?No current facility-administered medications for this visit.  ? ? ?OBJECTIV

## 2022-01-02 NOTE — Progress Notes (Signed)
?[place]  ?[phone] [phone]  ? ? ? ?ID: Catherine Coleman DOB: [birth year]  MR#: [id number]  JYN#:[id number] ? ?Patient Care Team: ?[name], MD as PCP - General (Family Medicine) ?Mauro Kaufmann, RN as Oncology Nurse Navigator ?Rockwell Germany, RN as Oncology Nurse Navigator ?Magrinat, Virgie Dad, MD (Inactive) as Consulting Physician (Oncology) ?[name], MD as Consulting Physician (General Surgery) ?Kyung Rudd, MD as Consulting Physician (Radiation Oncology) ?Damian Leavell, MD as Consulting Physician (Obstetrics and Gynecology) ?Zonia Kief, MD as Consulting Physician (Rehabilitation) ?Ulla Gallo, MD as Consulting Physician (Dermatology) ?Cherre Robins, RPH-CPP (Pharmacist) ?Benay Pike, MD ?OTHER MD: ? ?CHIEF COMPLAINT: Estrogen receptor positive breast cancer ? ?CURRENT TREATMENT: anastrozole ? ? ?INTERVAL HISTORY: ?Catherine Coleman returns today for follow up of her estrogen receptor positive breast cancer.  ? ?Since her last visit, she completed adjuvant radiation therapy on 04/02/2021. ? ?She also started anastrozole on 04/28/2021.  She is tolerating this remarkably well.  She is not having hot flashes or any other side effects that she is aware of at this point.  She is obtaining it at a good price ? ? ?REVIEW OF SYSTEMS: ?Catherine Coleman does water aerobics frequently as her main form of exercise.  She has severe back pain problems and is going to be seeing a spine specialist regarding this.  For that reason she does not walk long distances.  She has had no falls however which is good.  She does use walking poles when walking outside.  Aside from these issues a detailed review of systems today was stable ? ? COVID 19 VACCINATION STATUS: Status post Moderna x2 with Moderna then [employer] boosters ? ? ?HISTORY OF CURRENT ILLNESS: ?From the original intake note: ? ?Catherine Coleman had routine screening mammography on 11/13/2020 showing a possible abnormality in  the bilateral breasts. She underwent bilateral diagnostic mammography with tomography and left breast ultrasonography at [facility] on 12/02/2020 showing: breast density category B; 10 mm left breast mass at 7 o'clock; no suspicious left axillary lymphadenopathy; tow groups of indeterminate calcifications involving upper-inner and upper-outer right breast, each group spanning up to 6 cm. ? ?Accordingly on 12/11/2020 she proceeded to biopsy of the bilateral breast areas in question. The pathology from this procedure ([id number]) showed:  ?1. Left Breast ? - invasive ductal carcinoma, grade 2.  ? - Prognostic indicators significant for: estrogen receptor, 95% positive and progesterone receptor, 60% positive, both with strong staining intensity. Proliferation marker Ki67 at 15%. HER2 equivocal by immunohistochemistry (2+), but negative by fluorescent in situ hybridization with a signals ratio 1.34 and number per cell 1.95. ?2. Right Breast ? - fat necrosis and dystrophic calcifications ? ? Cancer Staging  ?Malignant neoplasm of lower-inner quadrant of left breast in female, estrogen receptor positive ([place]) ?Staging form: Breast, AJCC 8th Edition ?- Clinical stage from 12/18/2020: Stage IA (cT1b, cN0, cM0, G2, ER+, PR+, HER2-) - Signed by Chauncey Cruel, MD on 12/18/2020 ?Stage prefix: Initial diagnosis ?Histologic grading system: 3 grade system ? ? ?The patient's subsequent history is as detailed below. ? ? ?PAST MEDICAL HISTORY: ?Past Medical History:  ?Diagnosis Date  ? Arthritis   ? Atrophic rhinitis 04/21/2016  ? Breast cancer ([place]) 12/2020  ? left breast IDC  ? Constipation   ? Decreased hearing   ? Diverticulitis   ? Dry eyes   ? Fatigue   ? Floaters in visual field   ? Gall bladder disease   ? GERD (gastroesophageal reflux disease)   ?  Glaucoma   ? Hiatal hernia with gastroesophageal reflux   ? History of blood transfusion   ? for Knee replacement  ? Hyperlipidemia, mild 04/02/2016  ? Hypertension   ?  Hypothyroidism   ? Joint pain   ? Migraine aura without headache   ? Muscle pain   ? Muscle stiffness   ? Nasal congestion   ? Obesity 04/02/2016  ? Osteoarthritis   ? Parotiditis 04/21/2016  ? Vitamin D deficiency 04/02/2016  ? ? ?PAST SURGICAL HISTORY: ?Past Surgical History:  ?Procedure Laterality Date  ? APPENDECTOMY    ? BREAST LUMPECTOMY WITH RADIOACTIVE SEED LOCALIZATION Left 01/21/2021  ? Procedure: LEFT BREAST LUMPECTOMY WITH RADIOACTIVE SEED LOCALIZATION;  Surgeon: [name], MD;  Location: [facility];  Service: General;  Laterality: Left;  ? cataract surgery  2008  ? CHOLECYSTECTOMY  2010  ? JOINT REPLACEMENT Left   ? 2012  ? MASTOIDECTOMY    ? POSTERIOR CERVICAL FUSION/FORAMINOTOMY N/A 04/27/2017  ? Procedure: CERVICAL FIVE-SIX  OPEN REDUCTION OF FRACTURE, CERVICAL THREE-SEVEN  DORSAL FIXATION AND FUSION;  Surgeon: Ditty, Kevan Ny, MD;  Location: [facility];  Service: Neurosurgery;  Laterality: N/A;  ? SHOULDER ARTHROSCOPY Bilateral prior to 2010  ? TONSILLECTOMY    ? TOTAL HIP ARTHROPLASTY Right 03/22/2015  ? Procedure: RIGHT TOTAL HIP ARTHROPLASTY ANTERIOR APPROACH;  Surgeon: Dorna Leitz, MD;  Location: [facility];  Service: Orthopedics;  Laterality: Right;  ? TOTAL KNEE ARTHROPLASTY Bilateral 2007  ? TUBAL LIGATION    ? ? ?FAMILY HISTORY: ?Family History  ?Problem Relation Age of Onset  ? Dementia Mother   ? Hypertension Mother   ? Obesity Mother   ? Heart disease Father   ? Hypertension Father   ? Stroke Father   ? Diabetes Father   ? Hyperlipidemia Father   ? Alcoholism Father   ? Obesity Father   ? Arthritis Daughter   ? Cancer Maternal Grandfather   ?     colon cancer  ? Her father died at age 61 from MI. Her mother died at age 55 from dementia. Kambre has one brother (and no sisters). She reports colon cancer in her maternal grandfather. There is no family history of breast or ovarian cancer to her knowledge. ? ? ?GYNECOLOGIC HISTORY:  ?No LMP recorded. Patient is  postmenopausal. ?Menarche: 88 years old ?Age at first live birth: 88 years old ?GX P 4 ?LMP date unsure, "1985-90?" ?Contraceptive: used from [phone] ?HRT never used  ?Hysterectomy? no ?BSO? no ? ? 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 She is widowed. She lives independently by herself at [facility]. Daughter [name], age 72, works with computers in [place], [place]. Daughter [name], age 84, is retired but Musician and lives in [place], [place]. Daughter Jozlin Bently, age 80, works in Scientist, research (life sciences) estate in [place], [place]. Daughter Margee Trentham, age 11, works with computers in [place], [place]. Magda Paganini and Aloha graduated from [employer]. Haylea has 5 grandchildren, two of whom are MD's. ?  ? ADVANCED DIRECTIVES: in place ? ? ?HEALTH MAINTENANCE: ?Social History  ? ?Tobacco Use  ? Smoking status: Former  ? Smokeless tobacco: Never  ? Tobacco comments:  ?  Stopped 50 years ago.   ?Vaping Use  ? Vaping Use: Never used  ?Substance Use Topics  ? Alcohol use: Yes  ?  Alcohol/week: 0.0 standard drinks  ?  Comment: socially 3-4x/wk  ? Drug use: No  ? ? ?  Colonoscopy: 2014 (Dr. Stephens November), repeat not indicated (age) ? PAP: date unknown ? Bone density: 07/2019, -1.7 ?  ?No Active Allergies ? ? ?Current Outpatient Medications  ?Medication Sig Dispense Refill  ? anastrozole (ARIMIDEX) 1 MG tablet Take 1 tablet (1 mg total) by mouth daily. Start 04/28/2021 90 tablet 4  ? Biotin 10000 MCG TABS Take 1 tablet by mouth daily.    ? brimonidine (ALPHAGAN) 0.2 % ophthalmic solution Place 1 drop into both eyes 2 (two) times daily. 5 mL 12  ? famotidine (PEPCID) 40 MG tablet TAKE 1 TABLET BY MOUTH AT  BEDTIME AS NEEDED 90 tablet 3  ? levocetirizine (XYZAL) 5 MG tablet Take 1 tablet (5 mg total) by mouth every evening. 30 tablet 3  ? levothyroxine (SYNTHROID) 125 MCG tablet TAKE 1 TABLET BY MOUTH  DAILY 90 tablet 3  ? losartan (COZAAR) 50 MG  tablet TAKE 1 TABLET BY MOUTH  DAILY 90 tablet 3  ? MAGNESIUM-POTASSIUM PO Take 1 tablet by mouth daily.    ? mirabegron ER (MYRBETRIQ) 50 MG TB24 tablet Take 50 mg by mouth daily.    ? Multiple Vitamins-Minerals (MULTIVITAMIN [place]

## 2022-01-12 DIAGNOSIS — M461 Sacroiliitis, not elsewhere classified: Secondary | ICD-10-CM | POA: Diagnosis not present

## 2022-01-16 ENCOUNTER — Telehealth: Payer: Medicare Other

## 2022-01-26 ENCOUNTER — Ambulatory Visit (INDEPENDENT_AMBULATORY_CARE_PROVIDER_SITE_OTHER): Payer: Medicare Other | Admitting: Pharmacist

## 2022-01-26 DIAGNOSIS — R739 Hyperglycemia, unspecified: Secondary | ICD-10-CM

## 2022-01-26 DIAGNOSIS — I10 Essential (primary) hypertension: Secondary | ICD-10-CM

## 2022-01-26 DIAGNOSIS — M85839 Other specified disorders of bone density and structure, unspecified forearm: Secondary | ICD-10-CM

## 2022-01-26 NOTE — Patient Instructions (Signed)
Mrs. Tedeschi ?It was a pleasure speaking with you  ?Below is a summary of your health goals and care plan. Since you are doing well, I have not planned a follow up with the Clinical Pharmacist Practitioner. However, if you have any questions or concerns about medications, costs or side effects,  please feel free to contact me either at the phone number below or with a MyChart message.  ? ?Keep up the good work! ? ?Cherre Robins, PharmD ?Clinical Pharmacist ?[facility] ?[place] ?[phone] (direct line)  ?[phone] (main office number) ? ? ? ?Chronic Care Management Care Plan:  ? ?Hypertension ?BP Readings from Last 3 Encounters:  ?01/02/22 (!) 153/71  ?07/22/21 110/62  ?06/02/21 (!) 167/84  ? ?Pharmacist Clinical Goal(s): ?Over the next 180 days, patient will work with PharmD and providers to achieve BP goal <140/90 ?Current regimen:  ?Losartan '50mg'$  daily ?Patient self care activities - Over the next 180 days, patient will: ?Recommend checking blood pressure 1 to 2 times per wek, document, and provide at future appointments ?Ensure daily salt intake < 2300 mg/day ? ?Hyperlipidemia ?Lab Results  ?Component Value Date/Time  ? LDLCALC 96 07/22/2021 04:49 PM  ? LDLCALC 134 (H) 09/28/2018 11:47 AM  ? LDLDIRECT 138.0 05/15/2019 10:22 AM  ? ?Pharmacist Clinical Goal(s): ?Over the next 180 days, patient will work with PharmD and providers to maintain LDL goal < 100 or not have further increase in LDL ?Current regimen:  ?Diet and exercise management   ?Patient self care activities - Over the next 180 days, patient will: ?Work to limit intake of saturated and trans fat in diet to help reduce LDL - great job your LDL is now at goal! ? ?Pre-Diabetes ?Lab Results  ?Component Value Date/Time  ? HGBA1C 6.7 (H) 07/22/2021 04:49 PM  ? HGBA1C 6.2 10/09/2020 09:39 AM  ? ?Pharmacist Clinical Goal(s): ?Over the next 180 days, patient will work with PharmD and providers to achieve A1c goal <6.5% ?Current  regimen:  ?Diet and exercise management   ?Patient self care activities - Over the next 180 days, patient will:  ?Discussed limiting intake of sugar and carbohydrate containing foods; Discussed recommended serving size of 1/2 cup for potatoes, rice, pasta and corn. ?Reviewed A1c goal  ? ?GERD / acid reflux ?Pharmacist Clinical Goal(s) ?Over the next 180 days, patient will work with PharmD and providers to reduce symptoms of GERD / acid reflux ?Current regimen:  ?Famotidine '40mg'$  daily at bedtime  ?Omeprazole '20mg'$  daily ?Patient self care activities - Over the next 180 days, patient will: ?Continue current medication therapy ? ?Osteopenia ?Pharmacist Clinical Goal(s) ?Over the next  days, patient will work with PharmD and providers to reduce risk of fracture due to osteopenia ?Current regimen:  ?Calcium '600mg'$  daily ?Vitamin D daily ?Interventions: ?Discussed goal of '1200mg'$  of calcium daily through diet and/or supplementation ?Discussed goal of 1000 units of vitamin D through supplementation ?Patient self care activities - Over the next 180 days, patient will: ?Ensure '1200mg'$  of calcium daily through diet and /or supplementation ?Tamoxifen and similar medications can decrease your bone density;  Discussed Dr Magrinat's recommendation to check DEXA (measure of bone density) after 1 year of anastrozole therapy. ? ?Hypothyroidism:  ?Pharmacist Clinical Goal(s) ?Over the next  days, patient will work with PharmD and providers to maintain normal thyroid hormone levels through supplementation ?Current regimen:  ?Levotyroxine 127mg - take 1 tablet daily in morning ?Interventions: ?Recommended hold Biotin 1 week prior to testing of thyroid function ?Patient  self care activities - Over the next 180 days, patient will: ?Recommend holding Biotin 1 week prior to testing of thyroid function ?Take calcium supplement and Multi-vitamin at least 4 hours after levothyroxine dose; recommend move multivitamin to either lunch time  or  evening. ? ?Medication management ?Pharmacist Clinical Goal(s): ?Over the next 90 days, patient will work with PharmD and providers to maintain optimal medication adherence ?Current pharmacy: [facility] ?Interventions ?Comprehensive medication review performed. ?Continue current medication management strategy ?Patient self care activities - Over the next 180 days, patient will: ?Focus on medication adherence by filling and taking medications appropriately  ?Take medications as prescribed ?Report any questions or concerns to PharmD and/or provider(s) ? ?Patient verbalizes understanding of instructions and care plan provided today and agrees to view in [place]. Active MyChart status confirmed with patient.    ?

## 2022-01-26 NOTE — Chronic Care Management (AMB) (Signed)
? ? ?Chronic Care Management ?Pharmacy Note ? ?01/26/2022 ?Name:  Catherine Coleman MRN:  [id number] DOB:  1932-05-04 ? ?Summary:  ?Last blood pressure at oncology office was not at goal. Patient reports that blood pressure is usually elevated at oncologist office She reports that her watch monitors her HR and blood pressure. She is not recording blood pressure but states she has not received alert that blood pressure has been elevated. Sees PCP 02/05/2022. Continue current therapy for hypertension. If blood pressure is > 140/90 at upcoming appointment, consider increasing losartan to 140m daily.  ?Last A1c had increased. Patient is following recommendation to limit intake of sweets and high carbohydrates foods. Due to recheck A1c at upcoming appointment.  ?History of breast cancer. Taking anastrozole daily since 04/2021 - due to have DEXA rechecked in 04/2022.  ? ?Subjective: ?Catherine Coleman an 88y.o. year old female who is a primary patient of BMosie Lukes MD.  The CCM team was consulted for assistance with disease management and care coordination needs.   ? ?Engaged with patient by telephone for follow up visit in response to provider referral for pharmacy case management and/or care coordination services.  ? ?Consent to Services:  ?The patient was given information about Chronic Care Management services, agreed to services, and gave verbal consent prior to initiation of services.  Please see initial visit note for detailed documentation.  ? ?Patient Care Team: ?BMosie Lukes MD as PCP - General (Family Medicine) ?SMauro Kaufmann RN as Oncology Nurse Navigator ?MRockwell Germany RN as Oncology Nurse Navigator ?Magrinat, GVirgie Dad MD (Inactive) as Consulting Physician (Oncology) ?BCoralie Keens MD as Consulting Physician (General Surgery) ?MKyung Rudd MD as Consulting Physician (Radiation Oncology) ?BDamian Leavell MD as Consulting Physician (Obstetrics and Gynecology) ?SZonia Kief MD as  Consulting Physician (Rehabilitation) ?JUlla Gallo MD as Consulting Physician (Dermatology) ?ECherre Robins RPH-CPP (Pharmacist) ? ?Recent office visits: ?07/22/2021 - Fam Med (Dr BCharlett Catherine F/U HTN and osteoarthritis.  ? ?Recent consult visits: ?12/24/2021 - Oncology (Dr IChryl Heck F/U breast cancer. completed adjuvant radiation therapy on 04/02/2021. Started anastrozole on 04/28/2021 plan to contiue for 5 years. DEXA in August 2023.  ?10/30/2021 - [facility] (Dr BReva Bores Seen for mixed and stress urinary incontinence. Planned to consult with ophthalmology to determine if patient could take anticholinergic therapy.  ?08/28/2021 - Ophthalmology (Dr GEulas Post See for capsular glaucoma bilaterally, Drusen of left macula and keratoconjunctivitis bilateral.  ?06/02/21 Oncology (Dr MJana Hakim  Follow up estrogen receptor positive breast cancer. Ordered mammography in March 2023. F/U with Oncology April 2023. ?Started anastrazole 04/2021 - plan is to continue for 5 years. Also reocmmended BMD testing 1 year after starting anastrazole.  ? ?Hospital visits: ?Hospital admission 01/21/2021 for lumpectomy of left breast ? ?Objective: ? ?Lab Results  ?Component Value Date  ? CREATININE 0.76 01/02/2022  ? CREATININE 0.79 07/22/2021  ? CREATININE 0.75 12/18/2020  ? ? ?Lab Results  ?Component Value Date  ? HGBA1C 6.7 (H) 07/22/2021  ? ?Last diabetic Eye exam: No results found for: HMDIABEYEEXA  ?Last diabetic Foot exam: No results found for: HMDIABFOOTEX  ? ?   ?Component Value Date/Time  ? CHOL 189 07/22/2021 1649  ? CHOL 226 (H) 09/28/2018 1147  ? TRIG 183.0 (H) 07/22/2021 1649  ? HDL 55.80 07/22/2021 1649  ? HDL 60 09/28/2018 1147  ? CHOLHDL 3 07/22/2021 1649  ? VLDL 36.6 07/22/2021 1649  ? L[place]96 07/22/2021 1649  ? LDLCALC 134 (H) 09/28/2018 1147  ? LDLDIRECT  138.0 05/15/2019 1022  ? ? ? ?  Latest Ref Rng & Units 01/02/2022  ?  1:31 PM 07/22/2021  ?  4:49 PM 12/18/2020  ?  8:07 AM  ?Hepatic Function  ?Total Protein 6.5  - 8.1 g/dL 6.7   6.8   6.8    ?Albumin 3.5 - 5.0 g/dL 4.1   4.4   3.9    ?AST 15 - 41 U/L _0 ?ALT 0 - 44 U/L _1 ?Alk Phosphatase 38 - 126 U/L 107   104   105    ?Total Bilirubin 0.3 - 1.2 mg/dL 0.3   0.4   0.4    ? ? ?Lab Results  ?Component Value Date/Time  ? TSH 1.78 07/22/2021 04:49 PM  ? TSH 2.19 10/09/2020 09:39 AM  ? FREET4 1.01 12/19/2019 10:51 AM  ? FREET4 1.23 10/03/2019 11:56 AM  ? ? ? ?  Latest Ref Rng & Units 01/02/2022  ?  1:31 PM 07/22/2021  ?  4:49 PM 12/18/2020  ?  8:07 AM  ?CBC  ?WBC 4.0 - 10.5 K/uL 4.7   5.5   4.8    ?Hemoglobin 12.0 - 15.0 g/dL 13.0   13.5   13.7    ?Hematocrit 36.0 - 46.0 % 38.6   40.5   41.6    ?Platelets 150 - 400 K/uL 197   215.0   213    ? ? ?Lab Results  ?Component Value Date/Time  ? VD25OH 34.28 07/22/2021 04:49 PM  ? VD25OH 46.18 10/09/2020 09:39 AM  ? ? ?Clinical ASCVD: No  ?The ASCVD Risk score (Arnett DK, et al., 2019) failed to calculate for the following reasons: ?  The 2019 ASCVD risk score is only valid for ages 57 to 47   ? ?Other: See Care notes for DEXA results ? ?Social History  ? ?Tobacco Use  ?Smoking Status Former  ?Smokeless Tobacco Never  ?Tobacco Comments  ? Stopped 50 years ago.   ? ?BP Readings from Last 3 Encounters:  ?01/02/22 (!) 153/71  ?07/22/21 110/62  ?06/02/21 (!) 167/84  ? ?Pulse Readings from Last 3 Encounters:  ?01/02/22 70  ?07/22/21 98  ?06/02/21 84  ? ?Wt Readings from Last 3 Encounters:  ?01/02/22 216 lb 9 oz (98.2 kg)  ?07/22/21 214 lb (97.1 kg)  ?06/02/21 217 lb 14.4 oz (98.8 kg)  ? ? ?Assessment: Review of patient past medical history, allergies, medications, health status, including review of consultants reports, laboratory and other test data, was performed as part of comprehensive evaluation and provision of chronic care management services.  ? ?SDOH:  (Social Determinants of Health) assessments and interventions performed:  ? ? ? ?CCM Care Plan ? ?No Active Allergies ? ?Medications Reviewed Today   ? ? Reviewed  by Cherre Robins, RPH-CPP (Pharmacist) on 01/26/22 at 1143  Med List Status: <None>  ? ?Medication Order Taking? Sig Documenting Provider Last Dose Status Informant  ?anastrozole (ARIMIDEX) 1 MG tablet [id number] Yes Take 1 tablet (1 mg total) by mouth daily. Start 04/28/2021 Benay Pike, MD Taking Active   ?Biotin 10000 MCG TABS [id number] Yes Take 1 tablet by mouth daily. [provider] Taking Active   ?brimonidine (ALPHAGAN) 0.2 % ophthalmic solution [id number] Yes Place 1 drop into both eyes 2 (two) times daily. [name], PA-C Taking Active Pharmacy Records  ?Calcium Carb-Cholecalciferol (CALCIUM 600+D) 600-10 MG-MCG TABS [id number] Yes Take 1 tablet  by mouth daily. [provider] Taking Active   ?famotidine (PEPCID) 40 MG tablet [id number] Yes TAKE 1 TABLET BY MOUTH AT  BEDTIME AS NEEDED [name], MD Taking Active   ?levocetirizine (XYZAL) 5 MG tablet [id number] Yes Take 1 tablet (5 mg total) by mouth every evening. [name], MD Taking Active   ?         ?Med Note Antony Contras, Ofelia Podolski B   Fri Aug 22, 2021 10:29 AM) Only taking as needed  ?levothyroxine (SYNTHROID) 125 MCG tablet [id number] Yes TAKE 1 TABLET BY MOUTH  DAILY [name], MD Taking Active   ?losartan (COZAAR) 50 MG tablet [id number] Yes TAKE 1 TABLET BY MOUTH  DAILY [name], MD Taking Active   ?MAGNESIUM-POTASSIUM PO [id number] Yes Take 1 tablet by mouth daily. [provider] Taking Active   ?mirabegron ER (MYRBETRIQ) 50 MG TB24 tablet [id number] Yes Take 50 mg by mouth daily. [provider] Taking Active   ?Multiple Vitamins-Minerals (MULTIVITAMIN WITH MINERALS) tablet [id number] Yes Take 1 tablet by mouth daily. [provider] Taking Active   ?Multiple Vitamins-Minerals (PRESERVISION AREDS PO) [id number] Yes Take 1 tablet by mouth daily. [provider] Taking Active   ?omeprazole (PRILOSEC) 20 MG capsule [id number] Yes TAKE 1 CAPSULE BY MOUTH  DAILY [name],  MD Taking Active   ?polyethylene glycol (MIRALAX / GLYCOLAX) packet [id number] Yes Take 17 g by mouth daily.  ?Patient taking differently: Take 17 g by mouth daily as needed.  ? [name], PA-C Ta

## 2022-02-04 NOTE — Progress Notes (Unsigned)
Subjective:    Patient ID: Catherine Coleman, female    DOB: Oct 03, 1931, 88 y.o.   MRN: [id number]  No chief complaint on file.   HPI Patient is in today for her annual physical exam.  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   Constipation    Decreased hearing    Diverticulitis    Dry eyes    Fatigue    Floaters in visual field    Gall bladder disease    GERD (gastroesophageal reflux disease)    Glaucoma    Hiatal hernia with gastroesophageal reflux    History of blood transfusion    for Knee replacement   Hyperlipidemia, mild 04/02/2016   Hypertension    Hypothyroidism    Joint pain    Migraine aura without headache    Muscle pain    Muscle stiffness    Nasal congestion    Obesity 04/02/2016   Osteoarthritis    Parotiditis 04/21/2016   Vitamin D deficiency 04/02/2016    Past Surgical History:  Procedure Laterality Date   APPENDECTOMY     BREAST LUMPECTOMY WITH RADIOACTIVE SEED LOCALIZATION Left 01/21/2021   Procedure: LEFT BREAST LUMPECTOMY WITH RADIOACTIVE SEED LOCALIZATION;  Surgeon: [name], MD;  Location: [place];  Service: General;  Laterality: Left;   cataract surgery  2008   CHOLECYSTECTOMY  2010   JOINT REPLACEMENT Left    2012   MASTOIDECTOMY     POSTERIOR CERVICAL FUSION/FORAMINOTOMY N/A 04/27/2017   Procedure: CERVICAL FIVE-SIX  OPEN REDUCTION OF FRACTURE, CERVICAL THREE-SEVEN  DORSAL FIXATION AND FUSION;  Surgeon: Ditty, Kevan Ny, MD;  Location: [place];  Service: Neurosurgery;  Laterality: N/A;   SHOULDER ARTHROSCOPY Bilateral prior to 2010   [place] Right 03/22/2015   Procedure: RIGHT TOTAL HIP ARTHROPLASTY ANTERIOR APPROACH;  Surgeon: Dorna Leitz, MD;  Location: [place];  Service: Orthopedics;  Laterality: Right;   TOTAL KNEE ARTHROPLASTY Bilateral 2007   TUBAL LIGATION      Family History  Problem  Relation Age of Onset   Dementia Mother    Hypertension Mother    Obesity Mother    Heart disease Father    Hypertension Father    Stroke Father    Diabetes Father    Hyperlipidemia Father    Alcoholism Father    Obesity Father    Arthritis Daughter    Cancer Maternal Grandfather        colon cancer    Social History   Socioeconomic History   Marital status: Widowed    Spouse name: Not on file   Number of children: Not on file   Years of education: Not on file   Highest education level: Not on file  Occupational History   Occupation: Retired  Tobacco Use   Smoking status: Former   Smokeless tobacco: Never   Tobacco comments:    Stopped 50 years ago.   Vaping Use   Vaping Use: Never used  Substance and Sexual Activity   Alcohol use: Yes    Alcohol/week: 0.0 standard drinks    Comment: socially 3-4x/wk   Drug use: No   Sexual activity: Not Currently    Birth control/protection: Post-menopausal  Other Topics Concern   Not on file  Social History Narrative   Lives at [facility], widowed 7 years, no dietary restrictions. Retired from  neuropsychiatry work.    Social Determinants of Health   Financial Resource Strain: Low Risk    Difficulty of Paying Living Expenses: Not very hard  Food Insecurity: Not on file  Transportation Needs: Not on file  Physical Activity: Inactive   Days of Exercise per Week: 0 days   Minutes of Exercise per Session: 0 min  Stress: Not on file  Social Connections: Not on file  Intimate Partner Violence: Not on file    Outpatient Medications Prior to Visit  Medication Sig Dispense Refill   anastrozole (ARIMIDEX) 1 MG tablet Take 1 tablet (1 mg total) by mouth daily. Start 04/28/2021 90 tablet 4   Biotin 10000 MCG TABS Take 1 tablet by mouth daily.     brimonidine (ALPHAGAN) 0.2 % ophthalmic solution Place 1 drop into both eyes 2 (two) times daily. 5 mL 12   Calcium Carb-Cholecalciferol (CALCIUM 600+D) 600-10 MG-MCG TABS Take 1  tablet by mouth daily.     famotidine (PEPCID) 40 MG tablet TAKE 1 TABLET BY MOUTH AT  BEDTIME AS NEEDED 90 tablet 3   levocetirizine (XYZAL) 5 MG tablet Take 1 tablet (5 mg total) by mouth every evening. 30 tablet 3   levothyroxine (SYNTHROID) 125 MCG tablet TAKE 1 TABLET BY MOUTH  DAILY 90 tablet 3   losartan (COZAAR) 50 MG tablet TAKE 1 TABLET BY MOUTH  DAILY 90 tablet 3   MAGNESIUM-POTASSIUM PO Take 1 tablet by mouth daily.     mirabegron ER (MYRBETRIQ) 50 MG TB24 tablet Take 50 mg by mouth daily.     Multiple Vitamins-Minerals (MULTIVITAMIN WITH MINERALS) tablet Take 1 tablet by mouth daily.     Multiple Vitamins-Minerals (PRESERVISION AREDS PO) Take 1 tablet by mouth daily.     omeprazole (PRILOSEC) 20 MG capsule TAKE 1 CAPSULE BY MOUTH  DAILY 90 capsule 3   polyethylene glycol (MIRALAX / GLYCOLAX) packet Take 17 g by mouth daily. (Patient taking differently: Take 17 g by mouth daily as needed.) 14 each 0   Vitamin D, Cholecalciferol, 25 MCG (1000 UT) CAPS Take 1 capsule by mouth daily.     No facility-administered medications prior to visit.    No Active Allergies  ROS     Objective:    Physical Exam  There were no vitals taken for this visit. Wt Readings from Last 3 Encounters:  01/02/22 216 lb 9 oz (98.2 kg)  07/22/21 214 lb (97.1 kg)  06/02/21 217 lb 14.4 oz (98.8 kg)    Diabetic Foot Exam - Simple   No data filed    Lab Results  Component Value Date   WBC 4.7 01/02/2022   HGB 13.0 01/02/2022   HCT 38.6 01/02/2022   PLT 197 01/02/2022   GLUCOSE 131 (H) 01/02/2022   CHOL 189 07/22/2021   TRIG 183.0 (H) 07/22/2021   HDL 55.80 07/22/2021   LDLDIRECT 138.0 05/15/2019   LDLCALC 96 07/22/2021   ALT 19 01/02/2022   AST 20 01/02/2022   NA 139 01/02/2022   K 4.5 01/02/2022   CL 101 01/02/2022   CREATININE 0.76 01/02/2022   BUN 12 01/02/2022   CO2 30 01/02/2022   TSH 1.78 07/22/2021   INR 1.05 04/28/2017   HGBA1C 6.7 (H) 07/22/2021    Lab Results   Component Value Date   TSH 1.78 07/22/2021   Lab Results  Component Value Date   WBC 4.7 01/02/2022   HGB 13.0 01/02/2022   HCT 38.6 01/02/2022   MCV 96.0 01/02/2022  PLT 197 01/02/2022   Lab Results  Component Value Date   NA 139 01/02/2022   K 4.5 01/02/2022   CO2 30 01/02/2022   GLUCOSE 131 (H) 01/02/2022   BUN 12 01/02/2022   CREATININE 0.76 01/02/2022   BILITOT 0.3 01/02/2022   ALKPHOS 107 01/02/2022   AST 20 01/02/2022   ALT 19 01/02/2022   PROT 6.7 01/02/2022   ALBUMIN 4.1 01/02/2022   CALCIUM 9.0 01/02/2022   ANIONGAP 8 01/02/2022   GFR 67.25 07/22/2021   Lab Results  Component Value Date   CHOL 189 07/22/2021   Lab Results  Component Value Date   HDL 55.80 07/22/2021   Lab Results  Component Value Date   LDLCALC 96 07/22/2021   Lab Results  Component Value Date   TRIG 183.0 (H) 07/22/2021   Lab Results  Component Value Date   CHOLHDL 3 07/22/2021   Lab Results  Component Value Date   HGBA1C 6.7 (H) 07/22/2021       Assessment & Plan:   Problem List Items Addressed This Visit   None   I am having [name] maintain her multivitamin with minerals, brimonidine, polyethylene glycol, Biotin, mirabegron ER, levocetirizine, Vitamin D (Cholecalciferol), MAGNESIUM-POTASSIUM PO, levothyroxine, omeprazole, famotidine, losartan, anastrozole, Calcium Carb-Cholecalciferol, and Multiple Vitamins-Minerals (PRESERVISION AREDS PO).  No orders of the defined types were placed in this encounter.

## 2022-02-05 ENCOUNTER — Ambulatory Visit (INDEPENDENT_AMBULATORY_CARE_PROVIDER_SITE_OTHER): Payer: Medicare Other | Admitting: Family Medicine

## 2022-02-05 ENCOUNTER — Encounter: Payer: Self-pay | Admitting: Family Medicine

## 2022-02-05 VITALS — BP 108/68 | HR 64 | Resp 20 | Ht 65.0 in | Wt 217.0 lb

## 2022-02-05 DIAGNOSIS — I10 Essential (primary) hypertension: Secondary | ICD-10-CM

## 2022-02-05 DIAGNOSIS — R739 Hyperglycemia, unspecified: Secondary | ICD-10-CM | POA: Diagnosis not present

## 2022-02-05 DIAGNOSIS — E559 Vitamin D deficiency, unspecified: Secondary | ICD-10-CM | POA: Diagnosis not present

## 2022-02-05 DIAGNOSIS — E785 Hyperlipidemia, unspecified: Secondary | ICD-10-CM

## 2022-02-05 DIAGNOSIS — E038 Other specified hypothyroidism: Secondary | ICD-10-CM | POA: Diagnosis not present

## 2022-02-05 DIAGNOSIS — Z170 Estrogen receptor positive status [ER+]: Secondary | ICD-10-CM

## 2022-02-05 DIAGNOSIS — Z0000 Encounter for general adult medical examination without abnormal findings: Secondary | ICD-10-CM

## 2022-02-05 DIAGNOSIS — N3281 Overactive bladder: Secondary | ICD-10-CM

## 2022-02-05 DIAGNOSIS — E669 Obesity, unspecified: Secondary | ICD-10-CM

## 2022-02-05 DIAGNOSIS — C50312 Malignant neoplasm of lower-inner quadrant of left female breast: Secondary | ICD-10-CM

## 2022-02-05 LAB — COMPREHENSIVE METABOLIC PANEL
ALT: 16 U/L (ref 0–35)
AST: 17 U/L (ref 0–37)
Albumin: 4.2 g/dL (ref 3.5–5.2)
Alkaline Phosphatase: 101 U/L (ref 39–117)
BUN: 16 mg/dL (ref 6–23)
CO2: 29 mEq/L (ref 19–32)
Calcium: 9.1 mg/dL (ref 8.4–10.5)
Chloride: 101 mEq/L (ref 96–112)
Creatinine, Ser: 0.74 mg/dL (ref 0.40–1.20)
GFR: 72.46 mL/min (ref >60.00)
Glucose, Bld: 102 mg/dL — ABNORMAL HIGH (ref 70–99)
Potassium: 4.6 mEq/L (ref 3.5–5.1)
Sodium: 137 mEq/L (ref 135–145)
Total Bilirubin: 0.4 mg/dL (ref 0.2–1.2)
Total Protein: 6.3 g/dL (ref 6.0–8.3)

## 2022-02-05 LAB — LIPID PANEL
Cholesterol: 183 mg/dL (ref 0–200)
HDL: 59.6 mg/dL (ref >39.00)
LDL Cholesterol: 101 mg/dL — ABNORMAL HIGH (ref 0–99)
NonHDL: 122.91
Total CHOL/HDL Ratio: 3
Triglycerides: 108 mg/dL (ref 0.0–149.0)
VLDL: 21.6 mg/dL (ref 0.0–40.0)

## 2022-02-05 LAB — VITAMIN D 25 HYDROXY (VIT D DEFICIENCY, FRACTURES): VITD: 29.65 ng/mL — ABNORMAL LOW (ref 30.00–100.00)

## 2022-02-05 LAB — HEMOGLOBIN A1C: Hgb A1c MFr Bld: 6.4 % (ref 4.6–6.5)

## 2022-02-05 LAB — TSH: TSH: 3.76 u[IU]/mL (ref 0.35–5.50)

## 2022-02-05 MED ORDER — MIRABEGRON ER 50 MG PO TB24: 50.0000 mg | ORAL_TABLET | Freq: Every day | ORAL | 1 refills | Status: DC

## 2022-02-05 NOTE — Assessment & Plan Note (Signed)
Encourage heart healthy diet such as MIND or DASH diet, increase exercise, avoid trans fats, simple carbohydrates and processed foods, consider a krill or fish or flaxseed oil cap daily.  °

## 2022-02-05 NOTE — Assessment & Plan Note (Signed)
Patient encouraged to maintain heart healthy diet, regular exercise, adequate sleep. Consider daily probiotics. Take medications as prescribed, has aged out of colonoscopy, paps, continues with MGM last one in 11/2021 repeat in 1-2 years per patient preference. Last Dexa 2020 repeat in next 2 years.

## 2022-02-05 NOTE — Assessment & Plan Note (Signed)
She is asking [place] to take over the prescription for her Myrebetriq which is helpful. Refill given

## 2022-02-05 NOTE — Assessment & Plan Note (Signed)
On Levothyroxine, continue to monitor 

## 2022-02-05 NOTE — Assessment & Plan Note (Signed)
hgba1c acceptable, minimize simple carbs. Increase exercise as tolerated.  

## 2022-02-05 NOTE — Progress Notes (Signed)
Subjective:   By signing my name below, I, Zite Okoli, attest that this documentation has been prepared under the direction and in the presence of [name], MD. 02/05/2022     Patient ID: Catherine Coleman, female    DOB: 1932/06/03, 88 y.o.   MRN: [id number]  Chief Complaint  Patient presents with   Annual Exam     HPI Patient is in today for a comprehensive physical exam.  Catherine Coleman reports Catherine Coleman has been feeling fatigued over the past 6 months.Catherine Coleman notes Catherine Coleman had radiation last year.  Her blood pressure today is low compared to her baseline. Catherine Coleman does not feel weak or light headed today. BP Readings from Last 3 Encounters:  02/05/22 108/68  01/02/22 (!) 153/71  07/22/21 110/62    Catherine Coleman thinks Catherine Coleman exercised too much yesterday. Catherine Coleman exercises with a trainer 2x a week.  Does not think Catherine Coleman hydrates too much on exercise days.  Catherine Coleman complains of chronic hip pain and has been going to physical therapy and receiving injections. Catherine Coleman does not think they are too helpful.  Catherine Coleman is requesting for a refill on 50 mg myrbetriq to manage urinary incontinence.   Catherine Coleman has 4 Covid-19 vaccines at this time and is interested in the booster vaccine.   Catherine Coleman denies fever, congestion, eye pain, chest pain, palpitations, leg swelling, shortness of breath, nausea, abdominal pain, diarrhea and blood in stool. Also denies dysuria, frequency, back pain and headaches.   No changes in family medical history.  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   Constipation    Decreased hearing    Diverticulitis    Dry eyes    Fatigue    Floaters in visual field    Gall bladder disease    GERD (gastroesophageal reflux disease)    Glaucoma    Hiatal hernia with gastroesophageal reflux    History of blood transfusion    for Knee replacement   Hyperlipidemia, mild 04/02/2016   Hypertension    Hypothyroidism    Joint pain     Migraine aura without headache    Muscle pain    Muscle stiffness    Nasal congestion    Obesity 04/02/2016   Osteoarthritis    Parotiditis 04/21/2016   Vitamin D deficiency 04/02/2016    Past Surgical History:  Procedure Laterality Date   APPENDECTOMY     BREAST LUMPECTOMY WITH RADIOACTIVE SEED LOCALIZATION Left 01/21/2021   Procedure: LEFT BREAST LUMPECTOMY WITH RADIOACTIVE SEED LOCALIZATION;  Surgeon: [name], MD;  Location: [place];  Service: General;  Laterality: Left;   cataract surgery  2008   CHOLECYSTECTOMY  2010   JOINT REPLACEMENT Left    2012   MASTOIDECTOMY     POSTERIOR CERVICAL FUSION/FORAMINOTOMY N/A 04/27/2017   Procedure: CERVICAL FIVE-SIX  OPEN REDUCTION OF FRACTURE, CERVICAL THREE-SEVEN  DORSAL FIXATION AND FUSION;  Surgeon: Ditty, Kevan Ny, MD;  Location: [place];  Service: Neurosurgery;  Laterality: N/A;   SHOULDER ARTHROSCOPY Bilateral prior to 2010   [place] Right 03/22/2015   Procedure: RIGHT TOTAL HIP ARTHROPLASTY ANTERIOR APPROACH;  Surgeon: Dorna Leitz, MD;  Location: [place];  Service: Orthopedics;  Laterality: Right;   TOTAL KNEE ARTHROPLASTY Bilateral 2007   TUBAL LIGATION      Family History  Problem Relation Age of Onset   Dementia Mother  Hypertension Mother    Obesity Mother    Heart disease Father    Hypertension Father    Stroke Father    Diabetes Father    Hyperlipidemia Father    Alcoholism Father    Obesity Father    Arthritis Daughter    Cancer Maternal Grandfather        colon cancer    Social History   Socioeconomic History   Marital status: Widowed    Spouse name: Not on file   Number of children: Not on file   Years of education: Not on file   Highest education level: Not on file  Occupational History   Occupation: Retired  Tobacco Use   Smoking status: Former   Smokeless tobacco: Never   Tobacco comments:    Stopped 50 years ago.   Vaping Use   Vaping  Use: Never used  Substance and Sexual Activity   Alcohol use: Yes    Alcohol/week: 0.0 standard drinks    Comment: socially 3-4x/wk   Drug use: No   Sexual activity: Not Currently    Birth control/protection: Post-menopausal  Other Topics Concern   Not on file  Social History Narrative   Lives at [facility], widowed 7 years, no dietary restrictions. Retired from neuropsychiatry work.    Social Determinants of Health   Financial Resource Strain: Low Risk    Difficulty of Paying Living Expenses: Not very hard  Food Insecurity: Not on file  Transportation Needs: Not on file  Physical Activity: Inactive   Days of Exercise per Week: 0 days   Minutes of Exercise per Session: 0 min  Stress: Not on file  Social Connections: Not on file  Intimate Partner Violence: Not on file    Outpatient Medications Prior to Visit  Medication Sig Dispense Refill   anastrozole (ARIMIDEX) 1 MG tablet Take 1 tablet (1 mg total) by mouth daily. Start 04/28/2021 90 tablet 4   Biotin 10000 MCG TABS Take 1 tablet by mouth daily.     brimonidine (ALPHAGAN) 0.2 % ophthalmic solution Place 1 drop into both eyes 2 (two) times daily. 5 mL 12   Calcium Carb-Cholecalciferol 600-10 MG-MCG TABS Take 1 tablet by mouth daily.     famotidine (PEPCID) 40 MG tablet TAKE 1 TABLET BY MOUTH AT  BEDTIME AS NEEDED 90 tablet 3   levocetirizine (XYZAL) 5 MG tablet Take 1 tablet (5 mg total) by mouth every evening. 30 tablet 3   levothyroxine (SYNTHROID) 125 MCG tablet TAKE 1 TABLET BY MOUTH  DAILY 90 tablet 3   losartan (COZAAR) 50 MG tablet TAKE 1 TABLET BY MOUTH  DAILY 90 tablet 3   MAGNESIUM-POTASSIUM PO Take 1 tablet by mouth daily.     Multiple Vitamins-Minerals (MULTIVITAMIN WITH MINERALS) tablet Take 1 tablet by mouth daily.     Multiple Vitamins-Minerals (PRESERVISION AREDS PO) Take 1 tablet by mouth daily.     omeprazole (PRILOSEC) 20 MG capsule TAKE 1 CAPSULE BY MOUTH  DAILY 90 capsule 3   polyethylene glycol  (MIRALAX / GLYCOLAX) packet Take 17 g by mouth daily. (Patient taking differently: Take 17 g by mouth daily as needed.) 14 each 0   Vitamin D, Cholecalciferol, 25 MCG (1000 UT) CAPS Take 1 capsule by mouth daily.     mirabegron ER (MYRBETRIQ) 50 MG TB24 tablet Take 50 mg by mouth daily.     No facility-administered medications prior to visit.    No Known Allergies  Review of Systems  Constitutional:  Positive for malaise/fatigue. Negative for fever.  HENT:  Negative for congestion.   Eyes:  Negative for pain.  Respiratory:  Negative for shortness of breath.   Cardiovascular:  Negative for chest pain, palpitations and leg swelling.  Gastrointestinal:  Negative for abdominal pain, blood in stool, diarrhea and nausea.  Genitourinary:  Negative for dysuria and frequency.       (+) urinary incontinence    Musculoskeletal:  Positive for joint pain (hip). Negative for back pain.  Neurological:  Negative for headaches.      Objective:    Physical Exam Constitutional:      General: Catherine Coleman is not in acute distress.    Appearance: Catherine Coleman is well-developed.  HENT:     Head: Normocephalic and atraumatic.  Eyes:     Conjunctiva/sclera: Conjunctivae normal.  Neck:     Thyroid: No thyromegaly.  Cardiovascular:     Rate and Rhythm: Normal rate and regular rhythm.     Heart sounds: Normal heart sounds. No murmur heard. Pulmonary:     Effort: Pulmonary effort is normal. No respiratory distress.     Breath sounds: Normal breath sounds.  Abdominal:     General: Bowel sounds are normal. There is no distension.     Palpations: Abdomen is soft. There is no mass.     Tenderness: There is no abdominal tenderness.  Musculoskeletal:     Cervical back: Neck supple.  Lymphadenopathy:     Cervical: No cervical adenopathy.  Skin:    General: Skin is warm and dry.  Neurological:     Mental Status: Catherine Coleman is alert and oriented to person, place, and time.     Cranial Nerves: No facial asymmetry.     Deep  Tendon Reflexes:     Reflex Scores:      Patellar reflexes are 0 on the right side and 0 on the left side.    Comments: 5/5 strength in upper and lower extremities   Psychiatric:        Behavior: Behavior normal.    BP 108/68 (BP Location: Left Arm, Patient Position: Sitting, Cuff Size: Normal)   Pulse 64   Resp 20   Ht '5\' 5"'$  (1.651 m)   Wt 217 lb (98.4 kg)   SpO2 94%   BMI 36.11 kg/m  Wt Readings from Last 3 Encounters:  02/05/22 217 lb (98.4 kg)  01/02/22 216 lb 9 oz (98.2 kg)  07/22/21 214 lb (97.1 kg)    Diabetic Foot Exam - Simple   No data filed    Lab Results  Component Value Date   WBC 4.7 01/02/2022   HGB 13.0 01/02/2022   HCT 38.6 01/02/2022   PLT 197 01/02/2022   GLUCOSE 131 (H) 01/02/2022   CHOL 189 07/22/2021   TRIG 183.0 (H) 07/22/2021   HDL 55.80 07/22/2021   LDLDIRECT 138.0 05/15/2019   LDLCALC 96 07/22/2021   ALT 19 01/02/2022   AST 20 01/02/2022   NA 139 01/02/2022   K 4.5 01/02/2022   CL 101 01/02/2022   CREATININE 0.76 01/02/2022   BUN 12 01/02/2022   CO2 30 01/02/2022   TSH 1.78 07/22/2021   INR 1.05 04/28/2017   HGBA1C 6.7 (H) 07/22/2021    Lab Results  Component Value Date   TSH 1.78 07/22/2021   Lab Results  Component Value Date   WBC 4.7 01/02/2022   HGB 13.0 01/02/2022   HCT 38.6 01/02/2022   MCV 96.0 01/02/2022   PLT 197 01/02/2022  Lab Results  Component Value Date   NA 139 01/02/2022   K 4.5 01/02/2022   CO2 30 01/02/2022   GLUCOSE 131 (H) 01/02/2022   BUN 12 01/02/2022   CREATININE 0.76 01/02/2022   BILITOT 0.3 01/02/2022   ALKPHOS 107 01/02/2022   AST 20 01/02/2022   ALT 19 01/02/2022   PROT 6.7 01/02/2022   ALBUMIN 4.1 01/02/2022   CALCIUM 9.0 01/02/2022   ANIONGAP 8 01/02/2022   GFR 67.25 07/22/2021   Lab Results  Component Value Date   CHOL 189 07/22/2021   Lab Results  Component Value Date   HDL 55.80 07/22/2021   Lab Results  Component Value Date   LDLCALC 96 07/22/2021   Lab Results   Component Value Date   TRIG 183.0 (H) 07/22/2021   Lab Results  Component Value Date   CHOLHDL 3 07/22/2021   Lab Results  Component Value Date   HGBA1C 6.7 (H) 07/22/2021       Mammogram- Last checked on 11/14/2021. There was expected postsurgical changes in the left breast. Repeat in 1 year Colonoscopy- Last completed on 03/06/2013. There were signs of diverticulosis. No polyps.  Dexa- Last checked on 07/17/2019. Patient was osteopenic. Repeat in 2 years.  Assessment & Plan:   Problem List Items Addressed This Visit     Hypertension   Relevant Orders   TSH   Lipid panel   Comprehensive metabolic panel   Hypothyroidism    On Levothyroxine, continue to monitor       Relevant Orders   TSH   Obesity    Encouraged DASH or MIND diet, decrease po intake and increase exercise as tolerated. Needs 7-8 hours of sleep nightly. Avoid trans fats, eat small, frequent meals every 4-5 hours with lean proteins, complex carbs and healthy fats. Minimize simple carbs, high fat foods and processed foods       Vitamin D deficiency   Relevant Orders   VITAMIN D 25 Hydroxy (Vit-D Deficiency, Fractures)   Hyperlipidemia, mild    Encourage heart healthy diet such as MIND or DASH diet, increase exercise, avoid trans fats, simple carbohydrates and processed foods, consider a krill or fish or flaxseed oil cap daily.        Relevant Orders   Lipid panel   Preventative health care - Primary    Patient encouraged to maintain heart healthy diet, regular exercise, adequate sleep. Consider daily probiotics. Take medications as prescribed, has aged out of colonoscopy, paps, continues with MGM last one in 11/2021 repeat in 1-2 years per patient preference. Last Dexa 2020 repeat in next 2 years.       Overactive bladder    Catherine Coleman is asking [place] to take over the prescription for her Myrebetriq which is helpful. Refill given       Hyperglycemia    hgba1c acceptable, minimize simple carbs. Increase  exercise as tolerated.        Relevant Orders   Hemoglobin A1c   Malignant neoplasm of lower-inner quadrant of left breast in female, estrogen receptor positive ([place])    Follows with oncology and is doing well         Meds ordered this encounter  Medications   mirabegron ER (MYRBETRIQ) 50 MG TB24 tablet    Sig: Take 1 tablet (50 mg total) by mouth daily.    Dispense:  90 tablet    Refill:  1     I,Zite Okoli,acting as a scribe for Penni Homans, MD.,have documented all relevant documentation  on the behalf of Penni Homans, MD,as directed by  Penni Homans, MD while in the presence of Penni Homans, MD.   I, [name], MD., personally preformed the services described in this documentation.  All medical record entries made by the scribe were at my direction and in my presence.  I have reviewed the chart and discharge instructions (if applicable) and agree that the record reflects my personal performance and is accurate and complete. 02/05/2022

## 2022-02-05 NOTE — Patient Instructions (Addendum)
Gatorade on days you exercise 4-6 ounces Urogynecolosit: Catherine Coleman 67 Years and Older, Female Preventive care refers to lifestyle choices and visits with your health care provider that can promote health and wellness. Preventive care visits are also called wellness exams. What can I expect for my preventive care visit? Counseling Your health care provider may ask you questions about your: Medical history, including: Past medical problems. Family medical history. Pregnancy and menstrual history. History of falls. Current health, including: Memory and ability to understand (cognition). Emotional well-being. Home life and relationship well-being. Sexual activity and sexual health. Lifestyle, including: Alcohol, nicotine or tobacco, and drug use. Access to firearms. Diet, exercise, and sleep habits. Work and work Statistician. Sunscreen use. Safety issues such as seatbelt and bike helmet use. Physical exam Your health care provider will check your: Height and weight. These may be used to calculate your BMI (body mass index). BMI is a measurement that tells if you are at a healthy weight. Waist circumference. This measures the distance around your waistline. This measurement also tells if you are at a healthy weight and may help predict your risk of certain diseases, such as type 2 diabetes and high blood pressure. Heart rate and blood pressure. Body temperature. Skin for abnormal spots. What immunizations do I need?  Vaccines are usually given at various ages, according to a schedule. Your health care provider will recommend vaccines for you based on your age, medical history, and lifestyle or other factors, such as travel or where you work. What tests do I need? Screening Your health care provider may recommend screening tests for certain conditions. This may include: Lipid and cholesterol levels. Hepatitis C test. Hepatitis B test. HIV (human  immunodeficiency virus) test. STI (sexually transmitted infection) testing, if you are at risk. Lung cancer screening. Colorectal cancer screening. Diabetes screening. This is done by checking your blood sugar (glucose) after you have not eaten for a while (fasting). Mammogram. Talk with your health care provider about how often you should have regular mammograms. BRCA-related cancer screening. This may be done if you have a family history of breast, ovarian, tubal, or peritoneal cancers. Bone density scan. This is done to screen for osteoporosis. Talk with your health care provider about your test results, treatment options, and if necessary, the need for more tests. Follow these instructions at home: Eating and drinking  Eat a diet that includes fresh fruits and vegetables, whole grains, lean protein, and low-fat dairy products. Limit your intake of foods with high amounts of sugar, saturated fats, and salt. Take vitamin and mineral supplements as recommended by your health care provider. Do not drink alcohol if your health care provider tells you not to drink. If you drink alcohol: Limit how much you have to 0-1 drink a day. Know how much alcohol is in your drink. In the [place] one drink equals one 12 oz bottle of beer (355 mL), one 5 oz glass of wine (148 mL), or one 1 oz glass of hard liquor (44 mL). Lifestyle Brush your teeth every morning and night with fluoride toothpaste. Floss one time each day. Exercise for at least 30 minutes 5 or more days each week. Do not use any products that contain nicotine or tobacco. These products include cigarettes, chewing tobacco, and vaping devices, such as e-cigarettes. If you need help quitting, ask your health care provider. Do not use drugs. If you are sexually active, practice safe sex. Use a condom or other form of protection  in order to prevent STIs. Take aspirin only as told by your health care provider. Make sure that you understand how much  to take and what form to take. Work with your health care provider to find out whether it is safe and beneficial for you to take aspirin daily. Ask your health care provider if you need to take a cholesterol-lowering medicine (statin). Find healthy ways to manage stress, such as: Meditation, yoga, or listening to music. Journaling. Talking to a trusted person. Spending time with friends and family. Minimize exposure to UV radiation to reduce your risk of skin cancer. Safety Always wear your seat belt while driving or riding in a vehicle. Do not drive: If you have been drinking alcohol. Do not ride with someone who has been drinking. When you are tired or distracted. While texting. If you have been using any mind-altering substances or drugs. Wear a helmet and other protective equipment during sports activities. If you have firearms in your house, make sure you follow all gun safety procedures. What's next? Visit your health care provider once a year for an annual wellness visit. Ask your health care provider how often you should have your eyes and teeth checked. Stay up to date on all vaccines. This information is not intended to replace advice given to you by your health care provider. Make sure you discuss any questions you have with your health care provider. Document Revised: 02/26/2021 Document Reviewed: 02/26/2021 Elsevier Patient Education  [place].

## 2022-02-05 NOTE — Assessment & Plan Note (Signed)
Follows with oncology and is doing well

## 2022-02-05 NOTE — Assessment & Plan Note (Signed)
Encouraged DASH or MIND diet, decrease po intake and increase exercise as tolerated. Needs 7-8 hours of sleep nightly. Avoid trans fats, eat small, frequent meals every 4-5 hours with lean proteins, complex carbs and healthy fats. Minimize simple carbs, high fat foods and processed foods 

## 2022-02-06 ENCOUNTER — Other Ambulatory Visit (HOSPITAL_BASED_OUTPATIENT_CLINIC_OR_DEPARTMENT_OTHER): Payer: Self-pay

## 2022-02-06 ENCOUNTER — Ambulatory Visit: Payer: Medicare Other | Attending: Internal Medicine

## 2022-02-06 DIAGNOSIS — Z23 Encounter for immunization: Secondary | ICD-10-CM

## 2022-02-06 MED ORDER — MODERNA COVID-19 BIVAL BOOSTER 50 MCG/0.5ML IM SUSP
INTRAMUSCULAR | 0 refills | Status: DC
  Filled 2022-02-06: qty 0.5, 1d supply, fill #0

## 2022-02-06 NOTE — Progress Notes (Signed)
   Covid-19 Vaccination Clinic  Name:  Zareah Hunzeker    MRN: [id number] DOB: 19-Jan-1932  02/06/2022  Ms. Dobransky was observed post Covid-19 immunization for 15 minutes without incident. She was provided with Vaccine Information Sheet and instruction to access the V-Safe system.   Ms. Clemence was instructed to call 911 with any severe reactions post vaccine: Difficulty breathing  Swelling of face and throat  A fast heartbeat  A bad rash all over body  Dizziness and weakness   Immunizations Administered     Name Date Dose VIS Date Route   Moderna Covid-19 vaccine Bivalent Booster 02/06/2022  9:38 AM 0.5 mL 04/26/2021 Intramuscular   Manufacturer: Levan Hurst   Lot: [id number]   [place]: [id number]

## 2022-02-11 DIAGNOSIS — E039 Hypothyroidism, unspecified: Secondary | ICD-10-CM

## 2022-02-11 DIAGNOSIS — Z87891 Personal history of nicotine dependence: Secondary | ICD-10-CM

## 2022-02-11 DIAGNOSIS — I10 Essential (primary) hypertension: Secondary | ICD-10-CM

## 2022-02-11 DIAGNOSIS — M8580 Other specified disorders of bone density and structure, unspecified site: Secondary | ICD-10-CM

## 2022-02-11 DIAGNOSIS — E785 Hyperlipidemia, unspecified: Secondary | ICD-10-CM

## 2022-02-27 DIAGNOSIS — H16223 Keratoconjunctivitis sicca, not specified as Sjogren's, bilateral: Secondary | ICD-10-CM | POA: Diagnosis not present

## 2022-02-27 DIAGNOSIS — H401421 Capsular glaucoma with pseudoexfoliation of lens, left eye, mild stage: Secondary | ICD-10-CM | POA: Diagnosis not present

## 2022-02-27 DIAGNOSIS — H401412 Capsular glaucoma with pseudoexfoliation of lens, right eye, moderate stage: Secondary | ICD-10-CM | POA: Diagnosis not present

## 2022-03-02 ENCOUNTER — Other Ambulatory Visit (HOSPITAL_BASED_OUTPATIENT_CLINIC_OR_DEPARTMENT_OTHER): Payer: Self-pay

## 2022-03-02 ENCOUNTER — Other Ambulatory Visit: Payer: Self-pay

## 2022-03-02 ENCOUNTER — Emergency Department (HOSPITAL_BASED_OUTPATIENT_CLINIC_OR_DEPARTMENT_OTHER): Payer: Medicare Other

## 2022-03-02 ENCOUNTER — Emergency Department (HOSPITAL_BASED_OUTPATIENT_CLINIC_OR_DEPARTMENT_OTHER)
Admission: EM | Admit: 2022-03-02 | Discharge: 2022-03-02 | Disposition: A | Discharge status: Home / Self Care | Payer: Medicare Other | Attending: Emergency Medicine | Admitting: Emergency Medicine

## 2022-03-02 ENCOUNTER — Telehealth: Payer: Self-pay

## 2022-03-02 ENCOUNTER — Encounter (HOSPITAL_BASED_OUTPATIENT_CLINIC_OR_DEPARTMENT_OTHER): Payer: Self-pay | Admitting: Urology

## 2022-03-02 DIAGNOSIS — W010XXA Fall on same level from slipping, tripping and stumbling without subsequent striking against object, initial encounter: Secondary | ICD-10-CM | POA: Insufficient documentation

## 2022-03-02 DIAGNOSIS — J984 Other disorders of lung: Secondary | ICD-10-CM | POA: Diagnosis not present

## 2022-03-02 DIAGNOSIS — I2510 Atherosclerotic heart disease of native coronary artery without angina pectoris: Secondary | ICD-10-CM | POA: Diagnosis not present

## 2022-03-02 DIAGNOSIS — J9811 Atelectasis: Secondary | ICD-10-CM | POA: Diagnosis not present

## 2022-03-02 DIAGNOSIS — I2584 Coronary atherosclerosis due to calcified coronary lesion: Secondary | ICD-10-CM | POA: Diagnosis not present

## 2022-03-02 DIAGNOSIS — Z5329 Procedure and treatment not carried out because of patient's decision for other reasons: Secondary | ICD-10-CM | POA: Insufficient documentation

## 2022-03-02 DIAGNOSIS — Y93H2 Activity, gardening and landscaping: Secondary | ICD-10-CM | POA: Diagnosis not present

## 2022-03-02 DIAGNOSIS — S2242XA Multiple fractures of ribs, left side, initial encounter for closed fracture: Secondary | ICD-10-CM | POA: Insufficient documentation

## 2022-03-02 DIAGNOSIS — I712 Thoracic aortic aneurysm, without rupture, unspecified: Secondary | ICD-10-CM | POA: Diagnosis not present

## 2022-03-02 DIAGNOSIS — I700 Atherosclerosis of aorta: Secondary | ICD-10-CM | POA: Diagnosis not present

## 2022-03-02 LAB — CBC WITH DIFFERENTIAL/PLATELET
Abs Immature Granulocytes: 0.05 10*3/uL (ref 0.00–0.07)
Basophils Absolute: 0 10*3/uL (ref 0.0–0.1)
Basophils Relative: 0 %
Eosinophils Absolute: 0 10*3/uL (ref 0.0–0.5)
Eosinophils Relative: 0 %
HCT: 45.6 % (ref 36.0–46.0)
Hemoglobin: 15.8 g/dL — ABNORMAL HIGH (ref 12.0–15.0)
Immature Granulocytes: 1 %
Lymphocytes Relative: 9 %
Lymphs Abs: 0.8 10*3/uL (ref 0.7–4.0)
MCH: 32.3 pg (ref 26.0–34.0)
MCHC: 34.6 g/dL (ref 30.0–36.0)
MCV: 93.3 fL (ref 80.0–100.0)
Monocytes Absolute: 0.5 10*3/uL (ref 0.1–1.0)
Monocytes Relative: 6 %
Neutro Abs: 6.8 10*3/uL (ref 1.7–7.7)
Neutrophils Relative %: 84 %
Platelets: 238 10*3/uL (ref 150–400)
RBC: 4.89 MIL/uL (ref 3.87–5.11)
RDW: 12.4 % (ref 11.5–15.5)
WBC: 8.2 10*3/uL (ref 4.0–10.5)
nRBC: 0 % (ref 0.0–0.2)

## 2022-03-02 LAB — BASIC METABOLIC PANEL
Anion gap: 11 (ref 5–15)
BUN: 9 mg/dL (ref 8–23)
CO2: 25 mmol/L (ref 22–32)
Calcium: 9.1 mg/dL (ref 8.9–10.3)
Chloride: 97 mmol/L — ABNORMAL LOW (ref 98–111)
Creatinine, Ser: 0.71 mg/dL (ref 0.44–1.00)
GFR, Estimated: >60 mL/min (ref >60)
Glucose, Bld: 149 mg/dL — ABNORMAL HIGH (ref 70–99)
Potassium: 4.2 mmol/L (ref 3.5–5.1)
Sodium: 133 mmol/L — ABNORMAL LOW (ref 135–145)

## 2022-03-02 MED ORDER — METHOCARBAMOL 500 MG PO TABS
500.0000 mg | ORAL_TABLET | Freq: Three times a day (TID) | ORAL | 0 refills | Status: DC | PRN
  Filled 2022-03-02: qty 20, 7d supply, fill #0

## 2022-03-02 MED ORDER — METHOCARBAMOL 500 MG PO TABS
500.0000 mg | ORAL_TABLET | Freq: Once | ORAL | Status: AC
  Administered 2022-03-02: 500 mg via ORAL
  Filled 2022-03-02: qty 1

## 2022-03-02 MED ORDER — OXYCODONE-ACETAMINOPHEN 5-325 MG PO TABS
1.0000 | ORAL_TABLET | Freq: Four times a day (QID) | ORAL | 0 refills | Status: AC | PRN
  Filled 2022-03-02: qty 12, 3d supply, fill #0

## 2022-03-02 MED ORDER — OXYCODONE-ACETAMINOPHEN 5-325 MG PO TABS
1.0000 | ORAL_TABLET | Freq: Once | ORAL | Status: AC
  Administered 2022-03-02: 1 via ORAL
  Filled 2022-03-02: qty 1

## 2022-03-02 MED ORDER — FENTANYL CITRATE PF 50 MCG/ML IJ SOSY
25.0000 ug | PREFILLED_SYRINGE | Freq: Once | INTRAMUSCULAR | Status: AC
  Administered 2022-03-02: 25 ug via INTRAVENOUS
  Filled 2022-03-02: qty 1

## 2022-03-02 MED ORDER — LOSARTAN POTASSIUM 25 MG PO TABS
50.0000 mg | ORAL_TABLET | Freq: Once | ORAL | Status: AC
  Administered 2022-03-02: 50 mg via ORAL
  Filled 2022-03-02: qty 2

## 2022-03-02 NOTE — ED Notes (Signed)
RT note-Patient walked 25 ft, Sp02 remained 92%

## 2022-03-02 NOTE — ED Notes (Signed)
D/c paperwork reviewed with pt and family at bedside. All questions addressed prior to d/c, pt provided wheelchair assistance to lobby. NAD noted.

## 2022-03-02 NOTE — ED Notes (Signed)
ED Provider at bedside. 

## 2022-03-02 NOTE — Telephone Encounter (Signed)
RNCM spoke with [place] ([facility]) who advised their community has a skilled rehab facility as well as [place] therapy services.  Jenny Reichmann provided contact with rehab. This RNCM spoke with [facility] with [employer] [phone]) regarding [place] onsite therapy rehab. Larene Beach provided fax number [phone] and email: [email or web address] to send ED notes and signed order.   TOC will fax info to [facility].

## 2022-03-02 NOTE — ED Triage Notes (Signed)
Pt states fall yesterday, c/o left sided rib pain and chronic neck pain  States landed on left side on rock, no bruising noted at this time Pain worse with movement and deep breathing/cough  Took 2 x aleve this am   Hypertensive, did not take BP meds this am

## 2022-03-02 NOTE — ED Notes (Signed)
Patient transported to X-ray 

## 2022-03-02 NOTE — Discharge Instructions (Signed)
We recommended admission due to your multiple rib fractures and you are leaving Catherine Coleman.  Please follow-up with your primary care doctor and home health.  Take Tylenol, Motrin for pain control.  For breakthrough pain take the prescribed Percocet as needed.  Additionally recommend taking the muscle relaxer as needed.  Use incentive spirometer as discussed.  Come back to ER if you change your mind anytime or if you have any increased work of breathing, difficult to control pain or other new concerning symptom.  The radiologist also commented on some calcifications on your heart arteries as well as an aneurysm in your thoracic aorta.  The radiologist recommended annual imaging for your aneurysm.  Discussed this with your primary doctor who can arrange for the appropriate follow-up.

## 2022-03-02 NOTE — ED Provider Notes (Signed)
[facility] Provider Note   CSN: [id number] Arrival date & time: 03/02/22  1054     History  Chief Complaint  Patient presents with   Catherine Coleman    Catherine Coleman is a 88 y.o. female.  Presents for fall.  Patient reports that she was gardening yesterday when she fell and landed on her left side.  Landed on her left ribs.  Struck a rock.  Has having worsening pain today, pain worse with deep breath or cough.  Some shortness of breath due to the pain.  Has tried Aleve with minimal relief.  Pain currently moderate to severe.  Not on blood thinners.  She denies hitting her head, no LOC.  No neck or back pain.  No abdominal pain.  Has been able to walk since fall.  HPI     Home Medications Prior to Admission medications   Medication Sig Start Date End Date Taking? Authorizing Provider  methocarbamol (ROBAXIN) 500 MG tablet Take 1 tablet (500 mg total) by mouth every 8 (eight) hours as needed for muscle spasms. 03/02/22  Yes Lucrezia Starch, MD  oxyCODONE-acetaminophen (PERCOCET/ROXICET) 5-325 MG tablet Take 1 tablet by mouth every 6 (six) hours as needed for up to 3 days for severe pain. 03/02/22 03/05/22 Yes Zyree Traynham, Ellwood Dense, MD  anastrozole (ARIMIDEX) 1 MG tablet Take 1 tablet (1 mg total) by mouth daily. Start 04/28/2021 01/02/22   Benay Pike, MD  Biotin 10000 MCG TABS Take 1 tablet by mouth daily.    [provider]  brimonidine (ALPHAGAN) 0.2 % ophthalmic solution Place 1 drop into both eyes 2 (two) times daily. 05/05/17   Angiulli, Lavon Paganini, PA-C  Calcium Carb-Cholecalciferol 600-10 MG-MCG TABS Take 1 tablet by mouth daily.    [provider]  COVID-19 mRNA bivalent vaccine, Moderna, (MODERNA COVID-19 BIVAL BOOSTER) 50 MCG/0.5ML injection Inject into the muscle. 02/06/22   Carlyle Basques, MD  famotidine (PEPCID) 40 MG tablet TAKE 1 TABLET BY MOUTH AT  BEDTIME AS NEEDED 10/17/21   [name], MD  levocetirizine (XYZAL) 5 MG tablet  Take 1 tablet (5 mg total) by mouth every evening. 01/06/21   [name], MD  levothyroxine (SYNTHROID) 125 MCG tablet TAKE 1 TABLET BY MOUTH  DAILY 08/29/21   [name], MD  losartan (COZAAR) 50 MG tablet TAKE 1 TABLET BY MOUTH  DAILY 12/01/21   [name], MD  MAGNESIUM-POTASSIUM PO Take 1 tablet by mouth daily.    [provider]  mirabegron ER (MYRBETRIQ) 50 MG TB24 tablet Take 1 tablet (50 mg total) by mouth daily. 02/05/22   [name], MD  Multiple Vitamins-Minerals (MULTIVITAMIN WITH MINERALS) tablet Take 1 tablet by mouth daily.    [provider]  Multiple Vitamins-Minerals (PRESERVISION AREDS PO) Take 1 tablet by mouth daily.    [provider]  omeprazole (PRILOSEC) 20 MG capsule TAKE 1 CAPSULE BY MOUTH  DAILY 08/29/21   [name], MD  polyethylene glycol (MIRALAX / GLYCOLAX) packet Take 17 g by mouth daily. Patient taking differently: Take 17 g by mouth daily as needed. 05/05/17   Angiulli, Lavon Paganini, PA-C  Vitamin D, Cholecalciferol, 25 MCG (1000 UT) CAPS Take 1 capsule by mouth daily.    [provider]      Allergies    Patient has no known allergies.    Review of Systems   Review of Systems  Constitutional:  Negative for chills and fever.  HENT:  Negative for ear pain and sore throat.   Eyes:  Negative for pain and visual disturbance.  Respiratory:  Positive for shortness of breath. Negative for cough.   Cardiovascular:  Positive for chest pain. Negative for palpitations.  Gastrointestinal:  Negative for abdominal pain and vomiting.  Genitourinary:  Negative for dysuria and hematuria.  Musculoskeletal:  Negative for arthralgias and back pain.  Skin:  Negative for color change and rash.  Neurological:  Negative for seizures and syncope.  All other systems reviewed and are negative.   Physical Exam Updated Vital Signs BP 132/89   Pulse 92   Temp (!) 97.5 F (36.4 C)   Resp 16   Ht '5\' 5"'$  (1.651 m)   Wt 98.4  kg   SpO2 94%   BMI 36.10 kg/m  Physical Exam Vitals and nursing note reviewed.  Constitutional:      General: She is not in acute distress.    Appearance: She is well-developed.  HENT:     Head: Normocephalic and atraumatic.  Eyes:     Conjunctiva/sclera: Conjunctivae normal.  Cardiovascular:     Rate and Rhythm: Normal rate and regular rhythm.     Heart sounds: No murmur heard. Pulmonary:     Effort: Pulmonary effort is normal. No respiratory distress.     Breath sounds: Normal breath sounds.  Chest:     Comments: Tenderness to palpation to left lateral chest wall, no crepitus or deformity Abdominal:     Palpations: Abdomen is soft.     Tenderness: There is no abdominal tenderness.  Musculoskeletal:        General: No swelling.     Cervical back: Neck supple.  Skin:    General: Skin is warm and dry.     Capillary Refill: Capillary refill takes less than 2 seconds.  Neurological:     Mental Status: She is alert.  Psychiatric:        Mood and Affect: Mood normal.     ED Results / Procedures / Treatments   Labs (all labs ordered are listed, but only abnormal results are displayed) Labs Reviewed  CBC WITH DIFFERENTIAL/PLATELET - Abnormal; Notable for the following components:      Result Value   Hemoglobin 15.8 (*)    All other components within normal limits  BASIC METABOLIC PANEL - Abnormal; Notable for the following components:   Sodium 133 (*)    Chloride 97 (*)    Glucose, Bld 149 (*)    All other components within normal limits    EKG None  Radiology CT Chest Wo Contrast  Result Date: 03/02/2022 CLINICAL DATA:  Blunt chest trauma, suspected rib fractures EXAM: CT CHEST WITHOUT CONTRAST TECHNIQUE: Multidetector CT imaging of the chest was performed following the standard protocol without IV contrast. RADIATION DOSE REDUCTION: This exam was performed according to the departmental dose-optimization program which includes automated exposure control,  adjustment of the mA and/or kV according to patient size and/or use of iterative reconstruction technique. COMPARISON:  05/14/2017 FINDINGS: Cardiovascular: Atherosclerotic calcifications aorta, coronary arteries, proximal great vessels. Aneurysmal dilatation ascending thoracic aorta 4.3 cm transverse image 67 previously 4.0 cm. Minimal enlargement of cardiac chambers. No pericardial effusion. Mediastinum/Nodes: Small hiatal hernia. Small bowel air in fluid within mid to distal esophagus. No thoracic adenopathy. Base of cervical region normal appearance. Lungs/Pleura: Minimal bibasilar atelectasis. Lungs otherwise clear. Small focus of scarring the anterior RIGHT upper lobe. No infiltrate, pleural effusion, or pneumothorax. Upper Abdomen: Post cholecystectomy. Atrophic pancreas. Calcified granulomata  spleen. Peripelvic LEFT renal cyst 2.6 cm diameter image 165; no follow-up imaging recommended. Musculoskeletal: BILATERAL shoulder prostheses. Displaced fractures of the anterolateral LEFT sixth seventh eighth and ninth ribs. Degenerative disc disease changes thoracic spine. IMPRESSION: Mildly displaced fractures of the anterolateral LEFT sixth through ninth ribs. Aneurysmal dilatation ascending thoracic aorta, 4.3 cm transverse; Recommend annual imaging followup by CTA or MRA. This recommendation follows 2010 ACCF/AHA/AATS/ACR/ASA/SCA/SCAI/SIR/STS/SVM Guidelines for the Diagnosis and Management of Patients with Thoracic Aortic Disease. Circulation. 2010; 121: K160-F093. Aortic aneurysm NOS (ICD10-I71.9) Scattered atherosclerotic calcifications including coronary arteries. Aortic Atherosclerosis (ICD10-I70.0) and Aortic aneurysm NOS (ICD10-I71.9). Electronically Signed   By: Lavonia Dana M.D.   On: 03/02/2022 13:41   DG Ribs Unilateral W/Chest Left  Result Date: 03/02/2022 CLINICAL DATA:  Trauma, fall EXAM: LEFT RIBS AND CHEST - 3+ VIEW COMPARISON:  Chest radiographs done on 06/07/2018 FINDINGS: There is no evidence  of pneumothorax. There is minimal blunting of left lateral CP angle suggesting minimal effusion. There is no focal pulmonary consolidation. Transverse diameter of heart is slightly increased. There are no signs of pulmonary edema. Minimally displaced fractures are seen in the lateral aspect of left sixth and seventh ribs. There may be other undisplaced fractures in the adjacent ribs. There is previous bilateral shoulder arthroplasty. IMPRESSION: Minimally displaced fractures are seen in the left sixth and seventh ribs. There may be other undisplaced fractures in the adjacent ribs. There is pleural density in the lateral aspect of left lower lung fields, possibly small extrapleural hematoma and small left pleural effusion. There is no pneumothorax. Electronically Signed   By: Elmer Picker M.D.   On: 03/02/2022 12:32    Procedures Procedures    Medications Ordered in ED Medications  losartan (COZAAR) tablet 50 mg (50 mg Oral Given 03/02/22 1126)  oxyCODONE-acetaminophen (PERCOCET/ROXICET) 5-325 MG per tablet 1 tablet (1 tablet Oral Given 03/02/22 1152)  fentaNYL (SUBLIMAZE) injection 25 mcg (25 mcg Intravenous Given 03/02/22 1315)  methocarbamol (ROBAXIN) tablet 500 mg (500 mg Oral Given 03/02/22 1315)    ED Course/ Medical Decision Making/ A&P                           Medical Decision Making Amount and/or Complexity of Data Reviewed Labs: ordered. Radiology: ordered.  Risk Prescription drug management.   88 year old lady presenting for chest pain after fall.  Denies any other associated trauma and no LOC.  Here patient appears well in no distress, noted tenderness over the lateral chest wall.  Plain films concerning for fracture of ribs.  Check CT to evaluate for extent of injuries further.  I independently reviewed and interpreted CT and XR.  Left 6 through 9 rib fractures identified.  No pneumothorax or hemothorax.  Incidental findings included coronary artery calcifications and  thoracic aortic aneurysm.  I discussed all of the CT findings including incidental findings with patient.  Instructed that she needs to follow-up with primary doctor about the incidental findings and further annual monitoring.  I further discussed the multiple rib fractures and due to her age that patient ought to be admitted for pulmonary toilet, pain control and further observation.  After lengthy discussion with patient and her daughter, patient was able to demonstrate good understanding of my concerns but ultimately elected to be discharged home and leave against my medical advice.  She was instructed to closely follow-up with her primary care doctor.  I additionally consulted case management to request home health get set up.  Pain is currently well controlled and her vitals are stable.  Daughter states that she will help to monitor patient at home.  She was given further instructions on incentive spirometer use at home.  Stressed that she needs to return if she changes her mind at any time or if she has any worsening of her symptoms.        Final Clinical Impression(s) / ED Diagnoses Final diagnoses:  Closed fracture of multiple ribs of left side, initial encounter  Thoracic aortic aneurysm without rupture, unspecified part ([place])  Coronary artery calcification    Rx / DC Orders ED Discharge Orders          Ordered    oxyCODONE-acetaminophen (PERCOCET/ROXICET) 5-325 MG tablet  Every 6 hours PRN        03/02/22 1452    methocarbamol (ROBAXIN) 500 MG tablet  Every 8 hours PRN        03/02/22 1452              Lucrezia Starch, MD 03/02/22 1500

## 2022-03-02 NOTE — TOC Transition Note (Signed)
Transition of Care [facility]) - CM/SW Discharge Note   Patient Details  Name: Catherine Coleman MRN: [id number] Date of Birth: [birth year]  [facility] CM/SW Contact:  Roseanne Kaufman, RN Phone Number: 03/02/2022, 2:41 PM   Clinical Narrative:    Patient presented to [facility] ED with c/o left sided rib pain and chronic neck pain. RNCM received TOC consult for HHC: PT, SW. This RNCM spoke with EDP who advised patient lives in a house within a independent living [employer]. EDP reports patient declined SNF placement and needs HHC services set up. This RNCM spoke with patient and her daughter Ailene Ravel via phone. Patient does not wish to be placed at SNF. Patient's daughter Ailene Ravel states she can be there for her mother however she lives in [place].  The patient request this RNCM speak with Marijean Bravo at [facility] who can assist with therapy within their community. This RNCM left voicemail for [place] Mole. [phone]) and with Sallee Lange for call back.  TOC will continue to follow.  Patient Goals and CMS Choice  Patient states their goals for this ED visit and ongoing recovery are:: Patient wants to go home with home health services (PT & SW)   Discharge Placement  Expected Discharge Plan: Home/Self Care with home health services in place.               Discharge Plan and Services  Discharge Planning Services: CM Consult Living arrangements for the past 2 months: House in [place]                       DME Arranged: N/A DME Agency: NA [place]: Awaiting response from [place]: Awaiting response from [place] ([place]) Interventions     Readmission Risk Interventions     No data to display

## 2022-03-03 ENCOUNTER — Encounter: Payer: Self-pay | Admitting: Family Medicine

## 2022-03-03 NOTE — Telephone Encounter (Signed)
Error

## 2022-03-04 ENCOUNTER — Ambulatory Visit: Payer: Medicare Other | Admitting: Family

## 2022-03-04 DIAGNOSIS — R2689 Other abnormalities of gait and mobility: Secondary | ICD-10-CM | POA: Diagnosis not present

## 2022-03-04 DIAGNOSIS — I712 Thoracic aortic aneurysm, without rupture, unspecified: Secondary | ICD-10-CM | POA: Diagnosis not present

## 2022-03-04 DIAGNOSIS — S2242XD Multiple fractures of ribs, left side, subsequent encounter for fracture with routine healing: Secondary | ICD-10-CM | POA: Diagnosis not present

## 2022-03-04 DIAGNOSIS — M6281 Muscle weakness (generalized): Secondary | ICD-10-CM | POA: Diagnosis not present

## 2022-03-05 ENCOUNTER — Ambulatory Visit: Payer: Medicare Other | Admitting: Family Medicine

## 2022-03-05 DIAGNOSIS — E639 Nutritional deficiency, unspecified: Secondary | ICD-10-CM | POA: Diagnosis not present

## 2022-03-05 DIAGNOSIS — D519 Vitamin B12 deficiency anemia, unspecified: Secondary | ICD-10-CM | POA: Diagnosis not present

## 2022-03-05 DIAGNOSIS — I10 Essential (primary) hypertension: Secondary | ICD-10-CM | POA: Diagnosis not present

## 2022-03-05 DIAGNOSIS — M6281 Muscle weakness (generalized): Secondary | ICD-10-CM | POA: Diagnosis not present

## 2022-03-05 DIAGNOSIS — E039 Hypothyroidism, unspecified: Secondary | ICD-10-CM | POA: Diagnosis not present

## 2022-03-05 DIAGNOSIS — S2242XD Multiple fractures of ribs, left side, subsequent encounter for fracture with routine healing: Secondary | ICD-10-CM | POA: Diagnosis not present

## 2022-03-05 DIAGNOSIS — I712 Thoracic aortic aneurysm, without rupture, unspecified: Secondary | ICD-10-CM | POA: Diagnosis not present

## 2022-03-05 DIAGNOSIS — E785 Hyperlipidemia, unspecified: Secondary | ICD-10-CM | POA: Diagnosis not present

## 2022-03-05 DIAGNOSIS — K219 Gastro-esophageal reflux disease without esophagitis: Secondary | ICD-10-CM | POA: Diagnosis not present

## 2022-03-05 DIAGNOSIS — W19XXXD Unspecified fall, subsequent encounter: Secondary | ICD-10-CM | POA: Diagnosis not present

## 2022-03-05 DIAGNOSIS — R2689 Other abnormalities of gait and mobility: Secondary | ICD-10-CM | POA: Diagnosis not present

## 2022-03-09 DIAGNOSIS — I712 Thoracic aortic aneurysm, without rupture, unspecified: Secondary | ICD-10-CM | POA: Diagnosis not present

## 2022-03-09 DIAGNOSIS — R2689 Other abnormalities of gait and mobility: Secondary | ICD-10-CM | POA: Diagnosis not present

## 2022-03-09 DIAGNOSIS — M6281 Muscle weakness (generalized): Secondary | ICD-10-CM | POA: Diagnosis not present

## 2022-03-09 DIAGNOSIS — S2242XD Multiple fractures of ribs, left side, subsequent encounter for fracture with routine healing: Secondary | ICD-10-CM | POA: Diagnosis not present

## 2022-03-11 DIAGNOSIS — I712 Thoracic aortic aneurysm, without rupture, unspecified: Secondary | ICD-10-CM | POA: Diagnosis not present

## 2022-03-11 DIAGNOSIS — M6281 Muscle weakness (generalized): Secondary | ICD-10-CM | POA: Diagnosis not present

## 2022-03-11 DIAGNOSIS — R2689 Other abnormalities of gait and mobility: Secondary | ICD-10-CM | POA: Diagnosis not present

## 2022-03-11 DIAGNOSIS — S2242XD Multiple fractures of ribs, left side, subsequent encounter for fracture with routine healing: Secondary | ICD-10-CM | POA: Diagnosis not present

## 2022-03-16 ENCOUNTER — Other Ambulatory Visit: Payer: Self-pay | Admitting: *Deleted

## 2022-03-16 MED ORDER — ANASTROZOLE 1 MG PO TABS: 1.0000 mg | ORAL_TABLET | Freq: Every day | ORAL | 4 refills | Status: DC

## 2022-03-19 ENCOUNTER — Other Ambulatory Visit: Payer: Self-pay | Admitting: *Deleted

## 2022-03-19 ENCOUNTER — Telehealth: Payer: Self-pay | Admitting: Hematology and Oncology

## 2022-03-19 MED ORDER — ANASTROZOLE 1 MG PO TABS: 1.0000 mg | ORAL_TABLET | Freq: Every day | ORAL | 4 refills | Status: DC

## 2022-03-19 NOTE — Telephone Encounter (Signed)
Rescheduled appointment per providers. Patient aware.  ? ?

## 2022-03-20 ENCOUNTER — Encounter: Payer: Self-pay | Admitting: Medical

## 2022-03-20 ENCOUNTER — Ambulatory Visit (INDEPENDENT_AMBULATORY_CARE_PROVIDER_SITE_OTHER): Payer: Medicare Other | Admitting: Medical

## 2022-03-20 VITALS — BP 138/80 | HR 77 | Resp 18 | Ht 65.0 in | Wt 214.0 lb

## 2022-03-20 DIAGNOSIS — I712 Thoracic aortic aneurysm, without rupture, unspecified: Secondary | ICD-10-CM | POA: Diagnosis not present

## 2022-03-20 DIAGNOSIS — E785 Hyperlipidemia, unspecified: Secondary | ICD-10-CM

## 2022-03-20 DIAGNOSIS — I2510 Atherosclerotic heart disease of native coronary artery without angina pectoris: Secondary | ICD-10-CM | POA: Diagnosis not present

## 2022-03-20 DIAGNOSIS — S2249XA Multiple fractures of ribs, unspecified side, initial encounter for closed fracture: Secondary | ICD-10-CM | POA: Diagnosis not present

## 2022-03-20 NOTE — Progress Notes (Signed)
Subjective:    Patient ID: Catherine Coleman, female    DOB: 12-14-1931, 88 y.o.   MRN: [id number]  HPI  Pt in for follow up from fall.   She fractured 4 ribs about 3 weeks ago.  Pt states her pain is minimal now. Only twinge pain presently. States no longer needs narcotic.   She wants referral to cardiologist for incidental findings on ct chest.  Pt had ct which also   IMPRESSION: Mildly displaced fractures of the anterolateral LEFT sixth through ninth ribs.   Aneurysmal dilatation ascending thoracic aorta, 4.3 cm transverse; Recommend annual imaging followup by CTA or MRA. This recommendation follows 2010 ACCF/AHA/AATS/ACR/ASA/SCA/SCAI/SIR/STS/SVM Guidelines for the Diagnosis and Management of Patients with Thoracic Aortic Disease. Circulation. 2010; 121: X528-U132. Aortic aneurysm NOS (ICD10-I71.9)   Scattered atherosclerotic calcifications including coronary arteries.  Pt last lipid panel looked overall good with exception of elevated triglycerides.  Pt is not on any cholesterol med.  Pt last a1c was 6.4. Though 8 months ago ws 6.7.  Also htn and on losartan 50 mg daily.  Family hx of sroke and mi in dad.  On review pt never has chest pain or dypnea on exrtion. No associated cardiac sign or symptoms.  Review of Systems  Constitutional:  Negative for chills, fatigue and fever.  Respiratory:  Negative for cough, chest tightness, shortness of breath and wheezing.   Cardiovascular:  Negative for chest pain and palpitations.  Gastrointestinal:  Negative for abdominal pain.  Musculoskeletal:  Negative for back pain.       On mild rib pain left side on deep inspiration.  Neurological:  Negative for dizziness.  Hematological:  Negative for adenopathy. Does not bruise/bleed easily.  Psychiatric/Behavioral:  Negative for behavioral problems, decreased concentration and dysphoric mood.     Past Medical History:  Diagnosis Date   Arthritis    Atrophic rhinitis 04/21/2016    Breast cancer ([place]) 12/2020   left breast IDC   Constipation    Decreased hearing    Diverticulitis    Dry eyes    Fatigue    Floaters in visual field    Gall bladder disease    GERD (gastroesophageal reflux disease)    Glaucoma    Hiatal hernia with gastroesophageal reflux    History of blood transfusion    for Knee replacement   Hyperlipidemia, mild 04/02/2016   Hypertension    Hypothyroidism    Joint pain    Migraine aura without headache    Muscle pain    Muscle stiffness    Nasal congestion    Obesity 04/02/2016   Osteoarthritis    Parotiditis 04/21/2016   Vitamin D deficiency 04/02/2016     Social History   Socioeconomic History   Marital status: Widowed    Spouse name: Not on file   Number of children: Not on file   Years of education: Not on file   Highest education level: Not on file  Occupational History   Occupation: Retired  Tobacco Use   Smoking status: Never   Smokeless tobacco: Never   Tobacco comments:    Stopped 50 years ago.   Vaping Use   Vaping Use: Never used  Substance and Sexual Activity   Alcohol use: Yes    Alcohol/week: 0.0 standard drinks of alcohol    Comment: socially 3-4x/wk   Drug use: No   Sexual activity: Not Currently    Birth control/protection: Post-menopausal  Other Topics Concern   Not on file  Social  History Narrative   Lives at [facility], widowed 7 years, no dietary restrictions. Retired from neuropsychiatry work.    Social Determinants of Health   Financial Resource Strain: Low Risk  (08/22/2021)   Overall Financial Resource Strain (CARDIA)    Difficulty of Paying Living Expenses: Not very hard  Food Insecurity: No Food Insecurity (12/18/2020)   Hunger Vital Sign    Worried About Running Out of Food in the Last Year: Never true    Ran Out of Food in the Last Year: Never true  Transportation Needs: No Transportation Needs (12/18/2020)   PRAPARE - Hydrologist (Medical): No    Lack of  Transportation (Non-Medical): No  Physical Activity: Inactive (08/22/2021)   Exercise Vital Sign    Days of Exercise per Week: 0 days    Minutes of Exercise per Session: 0 min  Stress: Not on file  Social Connections: Not on file  Intimate Partner Violence: Not on file    Past Surgical History:  Procedure Laterality Date   APPENDECTOMY     BREAST LUMPECTOMY WITH RADIOACTIVE SEED LOCALIZATION Left 01/21/2021   Procedure: LEFT BREAST LUMPECTOMY WITH RADIOACTIVE SEED LOCALIZATION;  Surgeon: [name], MD;  Location: [facility];  Service: General;  Laterality: Left;   cataract surgery  2008   CHOLECYSTECTOMY  2010   JOINT REPLACEMENT Left    2012   MASTOIDECTOMY     POSTERIOR CERVICAL FUSION/FORAMINOTOMY N/A 04/27/2017   Procedure: CERVICAL FIVE-SIX  OPEN REDUCTION OF FRACTURE, CERVICAL THREE-SEVEN  DORSAL FIXATION AND FUSION;  Surgeon: Ditty, Kevan Ny, MD;  Location: [facility];  Service: Neurosurgery;  Laterality: N/A;   SHOULDER ARTHROSCOPY Bilateral prior to 2010   [place] Right 03/22/2015   Procedure: RIGHT TOTAL HIP ARTHROPLASTY ANTERIOR APPROACH;  Surgeon: Dorna Leitz, MD;  Location: [facility];  Service: Orthopedics;  Laterality: Right;   TOTAL KNEE ARTHROPLASTY Bilateral 2007   TUBAL LIGATION      Family History  Problem Relation Age of Onset   Dementia Mother    Hypertension Mother    Obesity Mother    Heart disease Father    Hypertension Father    Stroke Father    Diabetes Father    Hyperlipidemia Father    Alcoholism Father    Obesity Father    Arthritis Daughter    Cancer Maternal Grandfather        colon cancer    No Known Allergies  Current Outpatient Medications on File Prior to Visit  Medication Sig Dispense Refill   anastrozole (ARIMIDEX) 1 MG tablet Take 1 tablet (1 mg total) by mouth daily. Start 04/28/2021 90 tablet 4   Biotin 10000 MCG TABS Take 1 tablet by mouth daily.     brimonidine (ALPHAGAN) 0.2  % ophthalmic solution Place 1 drop into both eyes 2 (two) times daily. 5 mL 12   Calcium Carb-Cholecalciferol 600-10 MG-MCG TABS Take 1 tablet by mouth daily.     COVID-19 mRNA bivalent vaccine, Moderna, (MODERNA COVID-19 BIVAL BOOSTER) 50 MCG/0.5ML injection Inject into the muscle. 0.5 mL 0   famotidine (PEPCID) 40 MG tablet TAKE 1 TABLET BY MOUTH AT  BEDTIME AS NEEDED 90 tablet 3   levocetirizine (XYZAL) 5 MG tablet Take 1 tablet (5 mg total) by mouth every evening. 30 tablet 3   levothyroxine (SYNTHROID) 125 MCG tablet TAKE 1 TABLET BY MOUTH  DAILY 90 tablet 3   losartan (COZAAR) 50 MG  tablet TAKE 1 TABLET BY MOUTH  DAILY 90 tablet 3   MAGNESIUM-POTASSIUM PO Take 1 tablet by mouth daily.     methocarbamol (ROBAXIN) 500 MG tablet Take 1 tablet (500 mg total) by mouth every 8 (eight) hours as needed for muscle spasms. 20 tablet 0   mirabegron ER (MYRBETRIQ) 50 MG TB24 tablet Take 1 tablet (50 mg total) by mouth daily. 90 tablet 1   Multiple Vitamins-Minerals (MULTIVITAMIN WITH MINERALS) tablet Take 1 tablet by mouth daily.     Multiple Vitamins-Minerals (PRESERVISION AREDS PO) Take 1 tablet by mouth daily.     omeprazole (PRILOSEC) 20 MG capsule TAKE 1 CAPSULE BY MOUTH  DAILY 90 capsule 3   polyethylene glycol (MIRALAX / GLYCOLAX) packet Take 17 g by mouth daily. (Patient taking differently: Take 17 g by mouth daily as needed.) 14 each 0   Vitamin D, Cholecalciferol, 25 MCG (1000 UT) CAPS Take 1 capsule by mouth daily.     No current facility-administered medications on file prior to visit.    BP 138/80   Pulse 77   Resp 18   Ht '5\' 5"'$  (1.651 m)   Wt 214 lb (97.1 kg)   SpO2 91%   BMI 35.61 kg/m        Objective:   Physical Exam  General- No acute distress. Pleasant patient. Neck- Full range of motion, no jvd Lungs- Clear, even and unlabored. Heart- regular rate and rhythm. Neurologic- CNII- XII grossly intact.       Assessment & Plan:   Patient Instructions  Rib fractures  3 weeks ago. Now pain controlled and not requiring any medications.   Some CAD and aortic aneurysm by ct done in ED. Hx of high cholesterol, htn and prior a1c in diabetic range. Will go ahead and refer you to cardiologist for evaluation and tx. May benefit from low dose statin. If any cardiac type symptoms prior to cardiologist appt be seen in ED.  Htn- continue losartan.  Follow up as regularly scheduled with pcp or sooner if needed.    Mackie Pai, PA-C

## 2022-03-20 NOTE — Patient Instructions (Addendum)
Rib fractures 3 weeks ago. Now pain controlled and not requiring any medications.   Some CAD and aortic aneurysm(new finding) by ct done in ED. Hx of high cholesterol, htn and prior a1c in diabetic range. Will go ahead and refer you to cardiologist for evaluation and tx. May benefit from low dose statin. If any cardiac type symptoms prior to cardiologist appt be seen in ED.  Htn- continue losartan.  Follow up as regularly scheduled with pcp or sooner if needed.

## 2022-03-29 ENCOUNTER — Emergency Department (INDEPENDENT_AMBULATORY_CARE_PROVIDER_SITE_OTHER): Payer: Medicare Other

## 2022-03-29 ENCOUNTER — Other Ambulatory Visit: Payer: Self-pay

## 2022-03-29 ENCOUNTER — Emergency Department
Admission: RE | Admit: 2022-03-29 | Discharge: 2022-03-29 | Disposition: A | Discharge status: Home / Self Care | Payer: Medicare Other | Source: Ambulatory Visit | Attending: Family Medicine | Admitting: Family Medicine

## 2022-03-29 VITALS — BP 121/55 | HR 97 | Temp 97.9°F | Resp 24 | Ht 65.0 in | Wt 216.0 lb

## 2022-03-29 DIAGNOSIS — R918 Other nonspecific abnormal finding of lung field: Secondary | ICD-10-CM | POA: Diagnosis not present

## 2022-03-29 DIAGNOSIS — S2232XA Fracture of one rib, left side, initial encounter for closed fracture: Secondary | ICD-10-CM | POA: Diagnosis not present

## 2022-03-29 DIAGNOSIS — R0781 Pleurodynia: Secondary | ICD-10-CM | POA: Diagnosis not present

## 2022-03-29 DIAGNOSIS — R0602 Shortness of breath: Secondary | ICD-10-CM

## 2022-03-29 DIAGNOSIS — S2242XA Multiple fractures of ribs, left side, initial encounter for closed fracture: Secondary | ICD-10-CM | POA: Diagnosis not present

## 2022-03-29 DIAGNOSIS — W19XXXA Unspecified fall, initial encounter: Secondary | ICD-10-CM

## 2022-03-29 MED ORDER — DOXYCYCLINE HYCLATE 100 MG PO CAPS: 100.0000 mg | ORAL_CAPSULE | Freq: Two times a day (BID) | ORAL | 0 refills | Status: AC

## 2022-03-29 MED ORDER — HYDROCODONE-ACETAMINOPHEN 5-325 MG PO TABS: 1.0000 | ORAL_TABLET | Freq: Every day | ORAL | 0 refills | Status: DC | PRN

## 2022-03-29 MED ORDER — METHOCARBAMOL 500 MG PO TABS: 500.0000 mg | ORAL_TABLET | Freq: Three times a day (TID) | ORAL | 0 refills | Status: DC | PRN

## 2022-03-29 MED ORDER — ACETAMINOPHEN 325 MG PO TABS
650.0000 mg | ORAL_TABLET | Freq: Once | ORAL | Status: AC
Start: 2022-03-29 — End: 2022-03-29
  Administered 2022-03-29: 650 mg via ORAL

## 2022-03-29 NOTE — ED Triage Notes (Addendum)
Pt presents to Urgent Care with c/o pain to L rib cage area. Reports she fractured 4 ribs approx one month ago. States she developed painful muscle spasms in this area yesterday evening. Also states she has developed some shortness of breath this morning.

## 2022-03-29 NOTE — Discharge Instructions (Addendum)
Advised/informed patient of today's x-ray results with hard copy provided to patient.  Advised patient to take Doxycycline as directed with food to completion.  Advised may use Robaxin daily or as needed for accompanying muscle spasms.  Advised may use Vicodin daily as needed for breakthrough left-sided rib pain.  Encouraged patient to increase daily water intake while taking these medications.  Advised patient if symptoms worsen and/or unresolved please follow-up with PCP or here for further evaluation.

## 2022-03-29 NOTE — ED Provider Notes (Signed)
[facility]    CSN: [id number] Arrival date & time: 03/29/22  0956      History   Chief Complaint Chief Complaint  Patient presents with   Rib Injury   Shortness of Breath    HPI Catherine Coleman is a 88 y.o. female.   HPI 88 year old female presents with left rib cage pain.  Reports fractured 4 ribs approximately 1 month ago.  Chart reviews reveals on 619/23 patient sustained mildly displaced fractures of the anterior lateral left sixth through ninth ribs.  Reports has developed painful muscle spasms in this area yesterday evening and has developed some shortness of breath this morning.  PMH significant for breast cancer, obesity, muscle pain, fatigue, and HTN.  Past Medical History:  Diagnosis Date   Arthritis    Atrophic rhinitis 04/21/2016   Breast cancer ([place]) 12/2020   left breast IDC   Constipation    Decreased hearing    Diverticulitis    Dry eyes    Fatigue    Floaters in visual field    Gall bladder disease    GERD (gastroesophageal reflux disease)    Glaucoma    Hiatal hernia with gastroesophageal reflux    History of blood transfusion    for Knee replacement   Hyperlipidemia, mild 04/02/2016   Hypertension    Hypothyroidism    Joint pain    Migraine aura without headache    Muscle pain    Muscle stiffness    Nasal congestion    Obesity 04/02/2016   Osteoarthritis    Parotiditis 04/21/2016   Vitamin D deficiency 04/02/2016    Patient Active Problem List   Diagnosis Date Noted   Thoracic aortic aneurysm without rupture ([place]) 03/02/2022   Coronary artery calcification 03/02/2022   Malignant neoplasm of lower-inner quadrant of left breast in female, estrogen receptor positive ([place]) 12/13/2020   Recurrent UTI 10/08/2019   Sun-damaged skin 06/02/2019   Estrogen deficiency 11/13/2018   . 08/25/2018   Low back pain 06/12/2018   Hyperglycemia 01/28/2018   Recurrent sinusitis 10/24/2017   Overactive bladder 10/18/2017   Physical deconditioning  10/18/2017   History of cervical fracture 06/09/2017   Hypoxia 05/14/2017   Parotiditis 04/21/2016   Preventative health care 04/21/2016   Atrophic rhinitis 04/21/2016   Obesity 04/02/2016   Diverticulosis of colon without hemorrhage 04/02/2016   Vitamin D deficiency 04/02/2016   Hyperlipidemia, mild 04/02/2016   Hypertension    Arthritis    Hiatal hernia with gastroesophageal reflux    Glaucoma    Hypothyroidism    Primary osteoarthritis of right hip 03/22/2015    Past Surgical History:  Procedure Laterality Date   APPENDECTOMY     BREAST LUMPECTOMY WITH RADIOACTIVE SEED LOCALIZATION Left 01/21/2021   Procedure: LEFT BREAST LUMPECTOMY WITH RADIOACTIVE SEED LOCALIZATION;  Surgeon: [name], MD;  Location: [facility];  Service: General;  Laterality: Left;   cataract surgery  2008   CHOLECYSTECTOMY  2010   JOINT REPLACEMENT Left    2012   MASTOIDECTOMY     POSTERIOR CERVICAL FUSION/FORAMINOTOMY N/A 04/27/2017   Procedure: CERVICAL FIVE-SIX  OPEN REDUCTION OF FRACTURE, CERVICAL THREE-SEVEN  DORSAL FIXATION AND FUSION;  Surgeon: Ditty, Kevan Ny, MD;  Location: [facility];  Service: Neurosurgery;  Laterality: N/A;   SHOULDER ARTHROSCOPY Bilateral prior to 2010   [place] Right 03/22/2015   Procedure: RIGHT TOTAL HIP ARTHROPLASTY ANTERIOR APPROACH;  Surgeon: Dorna Leitz, MD;  Location: [facility];  Service: Orthopedics;  Laterality: Right;   TOTAL KNEE ARTHROPLASTY Bilateral 2007   TUBAL LIGATION      OB History     Gravida  4   Para  4   Term  4   Preterm      AB      Living  4      SAB      IAB      Ectopic      Multiple      Live Births               Home Medications    Prior to Admission medications   Medication Sig Start Date End Date Taking? Authorizing Provider  doxycycline (VIBRAMYCIN) 100 MG capsule Take 1 capsule (100 mg total) by mouth 2 (two) times daily for 10 days. 03/29/22 04/08/22 Yes  Eliezer Lofts, FNP  HYDROcodone-acetaminophen (NORCO/VICODIN) 5-325 MG tablet Take 1-2 tablets by mouth daily as needed. 03/29/22  Yes Eliezer Lofts, FNP  methocarbamol (ROBAXIN) 500 MG tablet Take 1 tablet (500 mg total) by mouth 3 (three) times daily as needed for muscle spasms. 03/29/22  Yes Eliezer Lofts, FNP  anastrozole (ARIMIDEX) 1 MG tablet Take 1 tablet (1 mg total) by mouth daily. Start 04/28/2021 03/19/22   Benay Pike, MD  Biotin 10000 MCG TABS Take 1 tablet by mouth daily.    [provider]  brimonidine (ALPHAGAN) 0.2 % ophthalmic solution Place 1 drop into both eyes 2 (two) times daily. 05/05/17   Angiulli, Lavon Paganini, PA-C  Calcium Carb-Cholecalciferol 600-10 MG-MCG TABS Take 1 tablet by mouth daily.    [provider]  COVID-19 mRNA bivalent vaccine, Moderna, (MODERNA COVID-19 BIVAL BOOSTER) 50 MCG/0.5ML injection Inject into the muscle. 02/06/22   Carlyle Basques, MD  famotidine (PEPCID) 40 MG tablet TAKE 1 TABLET BY MOUTH AT  BEDTIME AS NEEDED 10/17/21   [name], MD  levocetirizine (XYZAL) 5 MG tablet Take 1 tablet (5 mg total) by mouth every evening. 01/06/21   [name], MD  levothyroxine (SYNTHROID) 125 MCG tablet TAKE 1 TABLET BY MOUTH  DAILY 08/29/21   [name], MD  losartan (COZAAR) 50 MG tablet TAKE 1 TABLET BY MOUTH  DAILY 12/01/21   [name], MD  MAGNESIUM-POTASSIUM PO Take 1 tablet by mouth daily.    [provider]  mirabegron ER (MYRBETRIQ) 50 MG TB24 tablet Take 1 tablet (50 mg total) by mouth daily. 02/05/22   [name], MD  Multiple Vitamins-Minerals (MULTIVITAMIN WITH MINERALS) tablet Take 1 tablet by mouth daily.    [provider]  Multiple Vitamins-Minerals (PRESERVISION AREDS PO) Take 1 tablet by mouth daily.    [provider]  omeprazole (PRILOSEC) 20 MG capsule TAKE 1 CAPSULE BY MOUTH  DAILY 08/29/21   [name], MD  polyethylene glycol (MIRALAX / GLYCOLAX) packet Take 17 g by  mouth daily. Patient taking differently: Take 17 g by mouth daily as needed. 05/05/17   Angiulli, Lavon Paganini, PA-C  Vitamin D, Cholecalciferol, 25 MCG (1000 UT) CAPS Take 1 capsule by mouth daily.    [provider]    Family History Family History  Problem Relation Age of Onset   Dementia Mother    Hypertension Mother    Obesity Mother    Heart disease Father    Hypertension Father    Stroke Father    Diabetes Father    Hyperlipidemia Father    Alcoholism Father  Obesity Father    Arthritis Daughter    Cancer Maternal Grandfather        colon cancer    Social History Social History   Tobacco Use   Smoking status: Never   Smokeless tobacco: Never   Tobacco comments:    Stopped 50 years ago.   Vaping Use   Vaping Use: Never used  Substance Use Topics   Alcohol use: Yes    Alcohol/week: 0.0 standard drinks of alcohol    Comment: socially 3-4x/wk   Drug use: No     Allergies   Patient has no known allergies.   Review of Systems Review of Systems  Musculoskeletal:  Positive for myalgias.       Left sided rib cage pain     Physical Exam Triage Vital Signs ED Triage Vitals  Enc Vitals Group     BP 03/29/22 1019 (!) 121/55     Pulse Rate 03/29/22 1019 97     Resp 03/29/22 1019 (!) 24     Temp 03/29/22 1019 97.9 F (36.6 C)     Temp Source 03/29/22 1019 Oral     SpO2 03/29/22 1019 93 %     Weight 03/29/22 1011 216 lb (98 kg)     Height 03/29/22 1011 '5\' 5"'$  (1.651 m)     Head Circumference --      Peak Flow --      Pain Score 03/29/22 1011 0     Pain Loc --      Pain Edu? --      Excl. in [place]? --    No data found.  Updated Vital Signs BP (!) 121/55 (BP Location: Right Arm)   Pulse 97   Temp 97.9 F (36.6 C) (Oral)   Resp (!) 24   Ht '5\' 5"'$  (1.651 m)   Wt 216 lb (98 kg)   SpO2 93%   BMI 35.94 kg/m    Physical Exam Vitals and nursing note reviewed.  Constitutional:      General: She is not in acute distress.    Appearance: She is  normal weight. She is not ill-appearing.  HENT:     Head: Normocephalic and atraumatic.     Mouth/Throat:     Mouth: Mucous membranes are moist.     Pharynx: Oropharynx is clear.  Eyes:     Extraocular Movements: Extraocular movements intact.     Conjunctiva/sclera: Conjunctivae normal.     Pupils: Pupils are equal, round, and reactive to light.  Cardiovascular:     Rate and Rhythm: Normal rate and regular rhythm.     Pulses: Normal pulses.     Heart sounds: Normal heart sounds.  Pulmonary:     Effort: Pulmonary effort is normal.     Breath sounds: Normal breath sounds. No wheezing, rhonchi or rales.  Musculoskeletal:     Cervical back: Normal range of motion and neck supple.     Comments: Left rib cage: Mildly TTP over fifth-ninth ribs, no deformity noted  Skin:    General: Skin is warm and dry.  Neurological:     General: No focal deficit present.     Mental Status: She is alert and oriented to person, place, and time.      UC Treatments / Results  Labs (all labs ordered are listed, but only abnormal results are displayed) Labs Reviewed - No data to display  EKG   Radiology DG Ribs Unilateral W/Chest Left  Result Date: 03/29/2022 CLINICAL DATA:  Fall 1 month ago with left rib fractures EXAM: LEFT RIBS AND CHEST - 3+ VIEW COMPARISON:  CT 03/02/2022 FINDINGS: Redemonstration of mildly displaced fractures of the anterolateral left sixth through ninth ribs. No bridging callus formation is evident at this time. No new left-sided rib fractures seen. Increasing streaky left basilar airspace opacity. No pneumothorax. IMPRESSION: 1. Redemonstration of mildly displaced fractures of the left sixth through ninth ribs. No pneumothorax. 2. Increasing streaky left basilar airspace opacity which may represent atelectasis or developing infiltrate. Electronically Signed   By: Davina Poke D.O.   On: 03/29/2022 11:21    Procedures Procedures (including critical care time)  Medications  Ordered in UC Medications  acetaminophen (TYLENOL) tablet 650 mg (650 mg Oral Given 03/29/22 1020)    Initial Impression / Assessment and Plan / UC Course  I have reviewed the triage vital signs and the nursing notes.  Pertinent labs & imaging results that were available during my care of the patient were reviewed by me and considered in my medical decision making (see chart for details).     MDM: 1.  Shortness of breath, left-sided rib cage x-ray results above, streaky left basilar airspace opacity may represent developing infiltrate, Rx'd Doxycycline; 2.  Rib cage pain on left side-sided rib cage x-ray results above Rx'd Robaxin. Advised/informed patient of today's x-ray results with hard copy provided to patient.  Advised patient to take Doxycycline as directed with food to completion.  Advised may use Robaxin daily or as needed for accompanying muscle spasms.  Advised may use Vicodin daily as needed for breakthrough left-sided rib pain.  Encouraged patient to increase daily water intake while taking these medications.  Advised patient if symptoms worsen and/or unresolved please follow-up with PCP or here for further evaluation.  Discharged home, hemodynamically stable. Final Clinical Impressions(s) / UC Diagnoses   Final diagnoses:  SOB (shortness of breath)  Rib pain on left side     Discharge Instructions      Advised/informed patient of today's x-ray results with hard copy provided to patient.  Advised patient to take Doxycycline as directed with food to completion.  Advised may use Robaxin daily or as needed for accompanying muscle spasms.  Advised may use Vicodin daily as needed for breakthrough left-sided rib pain.  Encouraged patient to increase daily water intake while taking these medications.  Advised patient if symptoms worsen and/or unresolved please follow-up with PCP or here for further evaluation.     ED Prescriptions     Medication Sig Dispense Auth. Provider    HYDROcodone-acetaminophen (NORCO/VICODIN) 5-325 MG tablet Take 1-2 tablets by mouth daily as needed. 30 tablet Eliezer Lofts, FNP   doxycycline (VIBRAMYCIN) 100 MG capsule Take 1 capsule (100 mg total) by mouth 2 (two) times daily for 10 days. 20 capsule Eliezer Lofts, FNP   methocarbamol (ROBAXIN) 500 MG tablet Take 1 tablet (500 mg total) by mouth 3 (three) times daily as needed for muscle spasms. 30 tablet Eliezer Lofts, FNP      I have reviewed the PDMP during this encounter.   Eliezer Lofts, [place] 03/29/22 1146

## 2022-03-30 ENCOUNTER — Telehealth: Payer: Self-pay | Admitting: Family Medicine

## 2022-03-30 ENCOUNTER — Telehealth: Payer: Self-pay

## 2022-03-30 NOTE — Telephone Encounter (Signed)
Patient read her x-ray report.  Was worried that there was "something bad".  I explained to her that the fractures are still present, and that this is not unexpected.  I explained that the changes in her left lower lung could be a small decrease in airflow due to her injury or pneumonia.  The provider that saw her chose to treat her for pneumonia and the patient is taking her antibiotics

## 2022-03-30 NOTE — Telephone Encounter (Signed)
TCT pt. Pt verifiers identified. Pt states she had questions regarding xray results, this nurse states she would reach out to Francesca Oman to better explain the results to the pt. Pt states she was able to obtain rx's and denies any other needs.

## 2022-04-06 ENCOUNTER — Ambulatory Visit (INDEPENDENT_AMBULATORY_CARE_PROVIDER_SITE_OTHER): Payer: Medicare Other | Admitting: Family Medicine

## 2022-04-06 ENCOUNTER — Encounter: Payer: Self-pay | Admitting: Family Medicine

## 2022-04-06 VITALS — BP 135/93 | HR 79 | Ht 65.0 in | Wt 211.8 lb

## 2022-04-06 DIAGNOSIS — S2249XA Multiple fractures of ribs, unspecified side, initial encounter for closed fracture: Secondary | ICD-10-CM

## 2022-04-06 DIAGNOSIS — R531 Weakness: Secondary | ICD-10-CM

## 2022-04-06 NOTE — Patient Instructions (Signed)
BP is still slightly elevated, but I know you are uncomfortable with the rib pain. Try to monitor occasionally at home. Continue with pain management, you can try occasional ice to the area for relief as well. Brace the area when coughing/sneezing, etc.  Make sure you are using the incentive spirometer more frequently throughout the day to help keep your lungs open.  Physical therapy order placed for generalized weakness/deconditioning.   Please contact office for follow-up if symptoms do not improve or worsen. Seek emergency care if symptoms become severe.

## 2022-04-06 NOTE — Progress Notes (Signed)
   Established Patient Office Visit  Subjective   Patient ID: Catherine Coleman, female    DOB: [birth year]  Age: 88 y.o. MRN: [id number]  CC: rib fracture f/u    HPI Patient is here to follow-up on broken ribs.   On June 19th, 2023 patient sustained mildly displaced fractures to anterior lateral left 6th-9th ribs (fell while pulling weeds).  She ended up at Urgent Care on 03/29/22 for painful muscle spasms  in the area along with some shortness of breath. CXR revealed possible developing infiltrate and she was prescribed doxycycline along with robaxin and norco for the pain.  Patient reports she still has some mild swelling over the ribs, tenderness to palpation and certain position changes. She denies any dyspnea, bruising, fevers, cough, fatigue. She has only been using her incentive spirometer about once a day. She has been mostly sedentary, trying not to worsen the pain - she feels like this has caused some general weakness and deconditioning. She would like a referral for physical therapy at [facility] where she is currently in independent living.         ROS All review of systems negative except what is listed in the HPI    Objective:     BP (!) 135/93   Pulse 79   Ht '5\' 5"'$  (1.651 m)   Wt 211 lb 12.8 oz (96.1 kg)   BMI 35.25 kg/m    Physical Exam Vitals reviewed.  Constitutional:      Appearance: Normal appearance.  Cardiovascular:     Rate and Rhythm: Normal rate and regular rhythm.  Pulmonary:     Effort: Pulmonary effort is normal. No respiratory distress.     Breath sounds: Normal breath sounds. No stridor. No wheezing, rhonchi or rales.     Comments: Tenderness to palpation of left lateral ribs 6-9 Skin:    Findings: No bruising, erythema or rash.  Neurological:     General: No focal deficit present.     Mental Status: She is alert and oriented to person, place, and time. Mental status is at baseline.  Psychiatric:        Mood and Affect: Mood normal.         Behavior: Behavior normal.        Thought Content: Thought content normal.        Judgment: Judgment normal.         No results found for any visits on 04/06/22.    The ASCVD Risk score (Arnett DK, et al., 2019) failed to calculate for the following reasons:   The 2019 ASCVD risk score is only valid for ages 82 to 48    Assessment & Plan:   1. General weakness 2. Multiple rib fractures involving four or more ribs BP is still slightly elevated, but I know you are uncomfortable with the rib pain. Try to monitor occasionally at home. Continue with pain management, you can try occasional ice to the area for relief as well. Brace the area when coughing/sneezing, etc.  Make sure you are using the incentive spirometer more frequently throughout the day to help keep your lungs open.  Physical therapy order placed for generalized weakness/deconditioning.   Please contact office for follow-up if symptoms do not improve or worsen. Seek emergency care if symptoms become severe.    - Ambulatory referral to Physical Therapy  Return if symptoms worsen or fail to improve.    Terrilyn Saver, NP

## 2022-04-15 ENCOUNTER — Encounter: Payer: Self-pay | Admitting: Cardiovascular Disease

## 2022-04-15 ENCOUNTER — Ambulatory Visit: Payer: Medicare Other | Admitting: Cardiovascular Disease

## 2022-04-15 VITALS — BP 136/82 | HR 86 | Ht 65.0 in | Wt 212.0 lb

## 2022-04-15 DIAGNOSIS — R7303 Prediabetes: Secondary | ICD-10-CM | POA: Diagnosis not present

## 2022-04-15 DIAGNOSIS — E7800 Pure hypercholesterolemia, unspecified: Secondary | ICD-10-CM | POA: Diagnosis not present

## 2022-04-15 DIAGNOSIS — I7121 Aneurysm of the ascending aorta, without rupture: Secondary | ICD-10-CM | POA: Diagnosis not present

## 2022-04-15 DIAGNOSIS — E785 Hyperlipidemia, unspecified: Secondary | ICD-10-CM

## 2022-04-15 DIAGNOSIS — I700 Atherosclerosis of aorta: Secondary | ICD-10-CM | POA: Diagnosis not present

## 2022-04-15 DIAGNOSIS — I452 Bifascicular block: Secondary | ICD-10-CM

## 2022-04-15 MED ORDER — ROSUVASTATIN CALCIUM 10 MG PO TABS: 10.0000 mg | ORAL_TABLET | Freq: Every day | ORAL | 3 refills | Status: DC

## 2022-04-15 NOTE — Patient Instructions (Signed)
Medication Instructions:  START Rosuvastatin 10 mg once daily  *If you need a refill on your cardiac medications before your next appointment, please call your pharmacy*   Lab Work: Lipid at PCP in a few months  If you have labs (blood work) drawn today and your tests are completely normal, you will receive your results only by: [place] (if you have MyChart) OR A paper copy in the mail If you have any lab test that is abnormal or we need to change your treatment, we will call you to review the results.   Testing/Procedures: None ordered   Follow-Up: At [facility], you and your health needs are our priority.  As part of our continuing mission to provide you with exceptional heart care, we have created designated Provider Care Teams.  These Care Teams include your primary Cardiologist (physician) and Advanced Practice Providers (APPs -  Physician Assistants and Nurse Practitioners) who all work together to provide you with the care you need, when you need it.  We recommend signing up for the patient portal called "MyChart".  Sign up information is provided on this After Visit Summary.  MyChart is used to connect with patients for Virtual Visits (Telemedicine).  Patients are able to view lab/test results, encounter notes, upcoming appointments, etc.  Non-urgent messages can be sent to your provider as well.   To learn more about what you can do with MyChart, go to [email or web address].    Your next appointment:   12 month(s)  The format for your next appointment:   In Person  Provider:   Dr. Sallyanne Kuster  Important Information About Sugar

## 2022-04-15 NOTE — Progress Notes (Signed)
Cardiology Office Note:    Date:  04/19/2022   ID:  Catherine Coleman, DOB 09/18/1931, MRN [id number]  PCP:  [name], MD   [facility] Prov[facility] Cardiologist:  Sanda Klein, MD     Referring MD: Elise Benne   No chief complaint on file. Catherine Coleman is a 88 y.o. female who is being seen today for the evaluation of aortic atherosclerosis and aneurysmal dilation at the request of [place], Percell Miller, [place].   History of Present Illness:    Catherine Coleman is a 88 y.o. female with a hx of hypercholesterolemia, hypertension, prediabetes, who recently had a fall leading to left rib (6th-9th) fractures.  During a visit to the emergency room for ongoing chest discomfort, CT of the chest performed 03/02/2022 showed atherosclerotic calcifications involving the aorta, coronary arteries and proximal great vessels.  There was also enlargement of the previously described dilation of the ascending thoracic aorta at 4.3 cm (previously 4.0 cm in 2018).  Her chest pain from the rib fractures has improved.  She does not have exertional chest pain.  She denies shortness of breath at rest or with activity.  She has not had orthopnea, PND or lower extremity edema.  She has not had a history of stroke or TIA or other peripheral embolic events.  She denies claudication.  She denies other falls since the initial event in June, that happened when she was gardening and landed on a rock with her left chest.  There was no preceding dizziness or syncope.  Glycemic control is acceptable with a hemoglobin A1c of 6.4% without medications.  Most recent LDL cholesterol was 101.  Blood pressure was a little high when she initially checked in today at 146/92, but the recheck blood pressure was closer to desirable range.  She takes losartan as her only antihypertensive medication.  Past Medical History:  Diagnosis Date   Arthritis    Atrophic rhinitis 04/21/2016   Breast cancer ([place]) 12/2020   left  breast IDC   Constipation    Decreased hearing    Diverticulitis    Dry eyes    Fatigue    Floaters in visual field    Gall bladder disease    GERD (gastroesophageal reflux disease)    Glaucoma    Hiatal hernia with gastroesophageal reflux    History of blood transfusion    for Knee replacement   Hyperlipidemia, mild 04/02/2016   Hypertension    Hypothyroidism    Joint pain    Migraine aura without headache    Muscle pain    Muscle stiffness    Nasal congestion    Obesity 04/02/2016   Osteoarthritis    Parotiditis 04/21/2016   Vitamin D deficiency 04/02/2016    Past Surgical History:  Procedure Laterality Date   APPENDECTOMY     BREAST LUMPECTOMY WITH RADIOACTIVE SEED LOCALIZATION Left 01/21/2021   Procedure: LEFT BREAST LUMPECTOMY WITH RADIOACTIVE SEED LOCALIZATION;  Surgeon: [name], MD;  Location: [place];  Service: General;  Laterality: Left;   cataract surgery  2008   CHOLECYSTECTOMY  2010   JOINT REPLACEMENT Left    2012   MASTOIDECTOMY     POSTERIOR CERVICAL FUSION/FORAMINOTOMY N/A 04/27/2017   Procedure: CERVICAL FIVE-SIX  OPEN REDUCTION OF FRACTURE, CERVICAL THREE-SEVEN  DORSAL FIXATION AND FUSION;  Surgeon: Ditty, Kevan Ny, MD;  Location: [place];  Service: Neurosurgery;  Laterality: N/A;   SHOULDER ARTHROSCOPY Bilateral prior to 2010   TONSILLECTOMY  TOTAL HIP ARTHROPLASTY Right 03/22/2015   Procedure: RIGHT TOTAL HIP ARTHROPLASTY ANTERIOR APPROACH;  Surgeon: Dorna Leitz, MD;  Location: [place];  Service: Orthopedics;  Laterality: Right;   TOTAL KNEE ARTHROPLASTY Bilateral 2007   TUBAL LIGATION      Current Medications: Current Meds  Medication Sig   anastrozole (ARIMIDEX) 1 MG tablet Take 1 tablet (1 mg total) by mouth daily. Start 04/28/2021   Biotin 10000 MCG TABS Take 1 tablet by mouth daily.   brimonidine (ALPHAGAN) 0.2 % ophthalmic solution Place 1 drop into both eyes 2 (two) times daily.   Calcium Carb-Cholecalciferol  600-10 MG-MCG TABS Take 1 tablet by mouth daily.   COVID-19 mRNA bivalent vaccine, Moderna, (MODERNA COVID-19 BIVAL BOOSTER) 50 MCG/0.5ML injection Inject into the muscle.   famotidine (PEPCID) 40 MG tablet TAKE 1 TABLET BY MOUTH AT  BEDTIME AS NEEDED   levocetirizine (XYZAL) 5 MG tablet Take 1 tablet (5 mg total) by mouth every evening.   levothyroxine (SYNTHROID) 125 MCG tablet TAKE 1 TABLET BY MOUTH  DAILY   losartan (COZAAR) 50 MG tablet TAKE 1 TABLET BY MOUTH  DAILY   MAGNESIUM-POTASSIUM PO Take 1 tablet by mouth daily.   mirabegron ER (MYRBETRIQ) 50 MG TB24 tablet Take 1 tablet (50 mg total) by mouth daily.   Multiple Vitamins-Minerals (MULTIVITAMIN WITH MINERALS) tablet Take 1 tablet by mouth daily.   Multiple Vitamins-Minerals (PRESERVISION AREDS PO) Take 1 tablet by mouth daily.   omeprazole (PRILOSEC) 20 MG capsule TAKE 1 CAPSULE BY MOUTH  DAILY   polyethylene glycol (MIRALAX / GLYCOLAX) packet Take 17 g by mouth daily. (Patient taking differently: Take 17 g by mouth daily as needed.)   rosuvastatin (CRESTOR) 10 MG tablet Take 1 tablet (10 mg total) by mouth daily.   Vitamin D, Cholecalciferol, 25 MCG (1000 UT) CAPS Take 1 capsule by mouth daily.     Allergies:   Patient has no known allergies.   Social History   Socioeconomic History   Marital status: Widowed    Spouse name: Not on file   Number of children: Not on file   Years of education: Not on file   Highest education level: Not on file  Occupational History   Occupation: Retired  Tobacco Use   Smoking status: Never   Smokeless tobacco: Never   Tobacco comments:    Stopped 50 years ago.   Vaping Use   Vaping Use: Never used  Substance and Sexual Activity   Alcohol use: Yes    Alcohol/week: 0.0 standard drinks of alcohol    Comment: socially 3-4x/wk   Drug use: No   Sexual activity: Not on file  Other Topics Concern   Not on file  Social History Narrative   Lives at [facility], widowed 7 years, no dietary  restrictions. Retired from neuropsychiatry work.    Social Determinants of Health   Financial Resource Strain: Low Risk  (08/22/2021)   Overall Financial Resource Strain (CARDIA)    Difficulty of Paying Living Expenses: Not very hard  Food Insecurity: No Food Insecurity (12/18/2020)   Hunger Vital Sign    Worried About Running Out of Food in the Last Year: Never true    Ran Out of Food in the Last Year: Never true  Transportation Needs: No Transportation Needs (12/18/2020)   PRAPARE - Hydrologist (Medical): No    Lack of Transportation (Non-Medical): No  Physical Activity: Inactive (08/22/2021)   Exercise Vital Sign  Days of Exercise per Week: 0 days    Minutes of Exercise per Session: 0 min  Stress: Not on file  Social Connections: Not on file     Family History: The patient's family history includes Alcoholism in her father; Arthritis in her daughter; Cancer in her maternal grandfather; Dementia in her mother; Diabetes in her father; Heart disease in her father; Hyperlipidemia in her father; Hypertension in her father and mother; Obesity in her father and mother; Stroke in her father.  ROS:   Please see the history of present illness.     All other systems reviewed and are negative.  EKGs/Labs/Other Studies Reviewed:    The following studies were reviewed today: CT chest without contrast 03/02/2022 IMPRESSION: Mildly displaced fractures of the anterolateral LEFT sixth through ninth ribs.   Aneurysmal dilatation ascending thoracic aorta, 4.3 cm transverse; Recommend annual imaging followup by CTA or MRA. This recommendation follows 2010 ACCF/AHA/AATS/ACR/ASA/SCA/SCAI/SIR/STS/SVM Guidelines for the Diagnosis and Management of Patients with Thoracic Aortic Disease. Circulation. 2010; 121: R604-V409. Aortic aneurysm NOS (ICD10-I71.9)   Scattered atherosclerotic calcifications including coronary arteries.    EKG:  EKG is ordered today.  The ekg  ordered today demonstrates NSR, RBBB+LAFB (ECG 01/17/2021 showed RBBB, but not LAFB).QTc 507 ms  Recent Labs: 02/05/2022: ALT 16; TSH 3.76 03/02/2022: BUN 9; Creatinine, Ser 0.71; Hemoglobin 15.8; Platelets 238; Potassium 4.2; Sodium 133  Recent Lipid Panel    Component Value Date/Time   CHOL 183 02/05/2022 0948   CHOL 226 (H) 09/28/2018 1147   TRIG 108.0 02/05/2022 0948   HDL 59.60 02/05/2022 0948   HDL 60 09/28/2018 1147   CHOLHDL 3 02/05/2022 0948   VLDL 21.6 02/05/2022 0948   LDLCALC 101 (H) 02/05/2022 0948   LDLCALC 134 (H) 09/28/2018 1147   LDLDIRECT 138.0 05/15/2019 1022     Risk Assessment/Calculations:           Physical Exam:    VS:  BP 136/82   Pulse 86   Ht '5\' 5"'$  (1.651 m)   Wt 212 lb (96.2 kg)   SpO2 91%   BMI 35.28 kg/m     Wt Readings from Last 3 Encounters:  04/15/22 212 lb (96.2 kg)  04/06/22 211 lb 12.8 oz (96.1 kg)  03/29/22 216 lb (98 kg)     GEN: Moderate obesity, well nourished, well developed in no acute distress HEENT: Normal NECK: No JVD; No carotid bruits LYMPHATICS: No lymphadenopathy CARDIAC: Normal S1, widely split S2, RRR, no murmurs, rubs, gallops RESPIRATORY:  Clear to auscultation without rales, wheezing or rhonchi  ABDOMEN: Soft, non-tender, non-distended MUSCULOSKELETAL:  No edema; No deformity  SKIN: Warm and dry NEUROLOGIC:  Alert and oriented x 3 PSYCHIATRIC:  Normal affect   ASSESSMENT:    1. Aneurysm of ascending aorta without rupture ([place])   2. Atherosclerosis of aorta ([place])   3. Pure hypercholesterolemia   4. Bifascicular block   5. Prediabetes    PLAN:    In order of problems listed above:  Asc Ao Aneurysm: small, slow growth (40 mm in 2018, 43 mm in 2023). Consider repeat evaluation in 2-3 years. Unlikely to recommend preventive surgery at age near-50 years. BP a little high when first checked, lower and acceptable on recheck. Aortic atherosclerosis: also with coronary calcifications. No clinical evidence of  CAD or PAD. HLP: start rosuvastatin 10 mg daily, can recheck in 2 months at appt with Dr. [name].  Prefer target LDL less than 70. RBBB+LAFB: described possibility of second or third  degree AV block and how that would manifest clinically. Has a smart watch. We paired it with phone to allow ECG recordings and I instructed her how to send those tracings with a mychart message (for dizziness, syncope/presyncope, palpitations, etc). PreDM: Most recent hemoglobin A1c was 6.4%.  Try to lose weight and limit intake of simple carbs and starches with high glycemic index.           Medication Adjustments/Labs and Tests Ordered: Current medicines are reviewed at length with the patient today.  Concerns regarding medicines are outlined above.  Orders Placed This Encounter  Procedures   EKG 12-Lead   Meds ordered this encounter  Medications   rosuvastatin (CRESTOR) 10 MG tablet    Sig: Take 1 tablet (10 mg total) by mouth daily.    Dispense:  90 tablet    Refill:  3    Patient Instructions  Medication Instructions:  START Rosuvastatin 10 mg once daily  *If you need a refill on your cardiac medications before your next appointment, please call your pharmacy*   Lab Work: Lipid at PCP in a few months  If you have labs (blood work) drawn today and your tests are completely normal, you will receive your results only by: [place] (if you have MyChart) OR A paper copy in the mail If you have any lab test that is abnormal or we need to change your treatment, we will call you to review the results.   Testing/Procedures: None ordered   Follow-Up: At [facility], you and your health needs are our priority.  As part of our continuing mission to provide you with exceptional heart care, we have created designated Provider Care Teams.  These Care Teams include your primary Cardiologist (physician) and Advanced Practice Providers (APPs -  Physician Assistants and Nurse Practitioners) who all  work together to provide you with the care you need, when you need it.  We recommend signing up for the patient portal called "MyChart".  Sign up information is provided on this After Visit Summary.  MyChart is used to connect with patients for Virtual Visits (Telemedicine).  Patients are able to view lab/test results, encounter notes, upcoming appointments, etc.  Non-urgent messages can be sent to your provider as well.   To learn more about what you can do with MyChart, go to [email or web address].    Your next appointment:   12 month(s)  The format for your next appointment:   In Person  Provider:   Dr. Sallyanne Kuster  Important Information About Sugar         Signed, Sanda Klein, MD  04/19/2022 12:16 AM    [place]

## 2022-04-19 DIAGNOSIS — I700 Atherosclerosis of aorta: Secondary | ICD-10-CM | POA: Insufficient documentation

## 2022-04-19 DIAGNOSIS — I452 Bifascicular block: Secondary | ICD-10-CM | POA: Insufficient documentation

## 2022-04-19 DIAGNOSIS — I7121 Aneurysm of the ascending aorta, without rupture: Secondary | ICD-10-CM | POA: Insufficient documentation

## 2022-04-19 DIAGNOSIS — E7800 Pure hypercholesterolemia, unspecified: Secondary | ICD-10-CM | POA: Insufficient documentation

## 2022-04-22 ENCOUNTER — Encounter (INDEPENDENT_AMBULATORY_CARE_PROVIDER_SITE_OTHER): Payer: Self-pay

## 2022-04-30 DIAGNOSIS — R296 Repeated falls: Secondary | ICD-10-CM | POA: Diagnosis not present

## 2022-04-30 DIAGNOSIS — R2681 Unsteadiness on feet: Secondary | ICD-10-CM | POA: Diagnosis not present

## 2022-04-30 DIAGNOSIS — R2689 Other abnormalities of gait and mobility: Secondary | ICD-10-CM | POA: Diagnosis not present

## 2022-04-30 DIAGNOSIS — M6281 Muscle weakness (generalized): Secondary | ICD-10-CM | POA: Diagnosis not present

## 2022-04-30 DIAGNOSIS — M5459 Other low back pain: Secondary | ICD-10-CM | POA: Diagnosis not present

## 2022-05-06 DIAGNOSIS — M5459 Other low back pain: Secondary | ICD-10-CM | POA: Diagnosis not present

## 2022-05-06 DIAGNOSIS — M6281 Muscle weakness (generalized): Secondary | ICD-10-CM | POA: Diagnosis not present

## 2022-05-06 DIAGNOSIS — R2681 Unsteadiness on feet: Secondary | ICD-10-CM | POA: Diagnosis not present

## 2022-05-06 DIAGNOSIS — R296 Repeated falls: Secondary | ICD-10-CM | POA: Diagnosis not present

## 2022-05-06 DIAGNOSIS — R2689 Other abnormalities of gait and mobility: Secondary | ICD-10-CM | POA: Diagnosis not present

## 2022-05-08 DIAGNOSIS — M5459 Other low back pain: Secondary | ICD-10-CM | POA: Diagnosis not present

## 2022-05-08 DIAGNOSIS — M6281 Muscle weakness (generalized): Secondary | ICD-10-CM | POA: Diagnosis not present

## 2022-05-08 DIAGNOSIS — R296 Repeated falls: Secondary | ICD-10-CM | POA: Diagnosis not present

## 2022-05-08 DIAGNOSIS — R2681 Unsteadiness on feet: Secondary | ICD-10-CM | POA: Diagnosis not present

## 2022-05-08 DIAGNOSIS — R2689 Other abnormalities of gait and mobility: Secondary | ICD-10-CM | POA: Diagnosis not present

## 2022-05-11 DIAGNOSIS — R2689 Other abnormalities of gait and mobility: Secondary | ICD-10-CM | POA: Diagnosis not present

## 2022-05-11 DIAGNOSIS — M6281 Muscle weakness (generalized): Secondary | ICD-10-CM | POA: Diagnosis not present

## 2022-05-11 DIAGNOSIS — M5459 Other low back pain: Secondary | ICD-10-CM | POA: Diagnosis not present

## 2022-05-11 DIAGNOSIS — R2681 Unsteadiness on feet: Secondary | ICD-10-CM | POA: Diagnosis not present

## 2022-05-11 DIAGNOSIS — R296 Repeated falls: Secondary | ICD-10-CM | POA: Diagnosis not present

## 2022-05-13 DIAGNOSIS — M461 Sacroiliitis, not elsewhere classified: Secondary | ICD-10-CM | POA: Diagnosis not present

## 2022-05-14 DIAGNOSIS — R2681 Unsteadiness on feet: Secondary | ICD-10-CM | POA: Diagnosis not present

## 2022-05-14 DIAGNOSIS — M5459 Other low back pain: Secondary | ICD-10-CM | POA: Diagnosis not present

## 2022-05-14 DIAGNOSIS — R2689 Other abnormalities of gait and mobility: Secondary | ICD-10-CM | POA: Diagnosis not present

## 2022-05-14 DIAGNOSIS — M6281 Muscle weakness (generalized): Secondary | ICD-10-CM | POA: Diagnosis not present

## 2022-05-14 DIAGNOSIS — R296 Repeated falls: Secondary | ICD-10-CM | POA: Diagnosis not present

## 2022-05-20 DIAGNOSIS — R296 Repeated falls: Secondary | ICD-10-CM | POA: Diagnosis not present

## 2022-05-20 DIAGNOSIS — R2681 Unsteadiness on feet: Secondary | ICD-10-CM | POA: Diagnosis not present

## 2022-05-20 DIAGNOSIS — M5459 Other low back pain: Secondary | ICD-10-CM | POA: Diagnosis not present

## 2022-05-20 DIAGNOSIS — M6281 Muscle weakness (generalized): Secondary | ICD-10-CM | POA: Diagnosis not present

## 2022-05-20 DIAGNOSIS — R2689 Other abnormalities of gait and mobility: Secondary | ICD-10-CM | POA: Diagnosis not present

## 2022-05-21 ENCOUNTER — Other Ambulatory Visit: Payer: Self-pay | Admitting: Family Medicine

## 2022-05-21 DIAGNOSIS — R296 Repeated falls: Secondary | ICD-10-CM | POA: Diagnosis not present

## 2022-05-21 DIAGNOSIS — M5459 Other low back pain: Secondary | ICD-10-CM | POA: Diagnosis not present

## 2022-05-21 DIAGNOSIS — E038 Other specified hypothyroidism: Secondary | ICD-10-CM

## 2022-05-21 DIAGNOSIS — M6281 Muscle weakness (generalized): Secondary | ICD-10-CM | POA: Diagnosis not present

## 2022-05-21 DIAGNOSIS — R2689 Other abnormalities of gait and mobility: Secondary | ICD-10-CM | POA: Diagnosis not present

## 2022-05-21 DIAGNOSIS — R2681 Unsteadiness on feet: Secondary | ICD-10-CM | POA: Diagnosis not present

## 2022-05-25 ENCOUNTER — Other Ambulatory Visit: Payer: Self-pay | Admitting: Family Medicine

## 2022-05-28 ENCOUNTER — Ambulatory Visit: Payer: Medicare Other | Admitting: Family Medicine

## 2022-05-28 ENCOUNTER — Other Ambulatory Visit: Payer: Self-pay | Admitting: Family Medicine

## 2022-06-03 ENCOUNTER — Telehealth: Payer: Self-pay | Admitting: Family Medicine

## 2022-06-03 DIAGNOSIS — Z0279 Encounter for issue of other medical certificate: Secondary | ICD-10-CM

## 2022-06-03 NOTE — Assessment & Plan Note (Signed)
Supplement and monitor 

## 2022-06-03 NOTE — Assessment & Plan Note (Signed)
hgba1c acceptable, minimize simple carbs. Increase exercise as tolerated. Continue current meds, flu shot given today

## 2022-06-03 NOTE — Assessment & Plan Note (Signed)
Encourage heart healthy diet such as MIND or DASH diet, increase exercise, avoid trans fats, simple carbohydrates and processed foods, consider a krill or fish or flaxseed oil cap daily.  Tolerating Rosuvastatin 

## 2022-06-03 NOTE — Assessment & Plan Note (Signed)
On Levothyroxine, continue to monitor 

## 2022-06-03 NOTE — Telephone Encounter (Signed)
Got the physical therapy form and are in Dr. [name] bin  To fill out

## 2022-06-03 NOTE — Assessment & Plan Note (Signed)
Well controlled, no changes to meds. Encouraged heart healthy diet such as the DASH diet and exercise as tolerated.  °

## 2022-06-03 NOTE — Telephone Encounter (Signed)
[facility] employee dropped of document to be filled out by provider (Physical Therapy 5 pages form) Would like to have document to be faxed when document ready to [phone]. Document put at front office tray under providers name. Pt tel [phone]

## 2022-06-04 ENCOUNTER — Encounter: Payer: Self-pay | Admitting: Family Medicine

## 2022-06-04 ENCOUNTER — Ambulatory Visit (INDEPENDENT_AMBULATORY_CARE_PROVIDER_SITE_OTHER): Payer: Medicare Other | Admitting: Family Medicine

## 2022-06-04 VITALS — BP 128/78 | HR 84 | Temp 84.0°F | Resp 16 | Ht 66.0 in | Wt 213.6 lb

## 2022-06-04 DIAGNOSIS — R739 Hyperglycemia, unspecified: Secondary | ICD-10-CM

## 2022-06-04 DIAGNOSIS — E038 Other specified hypothyroidism: Secondary | ICD-10-CM | POA: Diagnosis not present

## 2022-06-04 DIAGNOSIS — E559 Vitamin D deficiency, unspecified: Secondary | ICD-10-CM | POA: Diagnosis not present

## 2022-06-04 DIAGNOSIS — R32 Unspecified urinary incontinence: Secondary | ICD-10-CM

## 2022-06-04 DIAGNOSIS — E785 Hyperlipidemia, unspecified: Secondary | ICD-10-CM | POA: Diagnosis not present

## 2022-06-04 DIAGNOSIS — Z23 Encounter for immunization: Secondary | ICD-10-CM

## 2022-06-04 DIAGNOSIS — M25551 Pain in right hip: Secondary | ICD-10-CM

## 2022-06-04 DIAGNOSIS — R5381 Other malaise: Secondary | ICD-10-CM | POA: Diagnosis not present

## 2022-06-04 DIAGNOSIS — I10 Essential (primary) hypertension: Secondary | ICD-10-CM

## 2022-06-04 NOTE — Progress Notes (Signed)
Subjective:   By signing my name below, I, Kellie Simmering, attest that this documentation has been prepared under the direction and in the presence of [name], MD 06/04/2022.     Patient ID: Catherine Coleman, female    DOB: March 10, 1932, 88 y.o.   MRN: [id number]  Chief Complaint  Patient presents with   Follow-up    Here for follow up   HPI Patient is in today for an office visit.  Cardiology: She saw her cardiologist Dr. Sallyanne Kuster, on 04/15/2022, who prescribed Rosuvastatin. However, she reports that she is no longer taking this medication and is doing well without it.   Fractured ribs: She reports that she is no longer experiencing pain after fracturing multiple ribs of the left side on 03/02/2022.   Immunizations: She has been informed about receiving COVID-19, high-dose Flu, and RSV immunizations. She is requesting the high-dose Flu immunization today.   Sacroiliac joint pain: She complains of right-sided sacroiliac joint pain. She has been seeing Dr. Maia Petties who has administered several injections.   Sinus infection: She suspects that she has a sinus infection due to constant congestion. She confirms having chills and a squeaking sound from her chest when laying down. She states that she currently takes Levocetirizine 125 mcg and uses several nasal sprays.   Urology She is requesting a referral for a new gynecologist. She reports that she experiences urinary incontinence and her urine constantly dribbles. She does wear a pad.  Weight: She is requesting a referral for weight control.  Wt Readings from Last 3 Encounters:  06/04/22 213 lb 9.6 oz (96.9 kg)  04/15/22 212 lb (96.2 kg)  04/06/22 211 lb 12.8 oz (96.1 kg)   Past Medical History:  Diagnosis Date   Arthritis    Atrophic rhinitis 04/21/2016   Breast cancer ([place]) 12/2020   left breast IDC   Constipation    Decreased hearing    Diverticulitis    Dry eyes    Fatigue    Floaters in visual field    Gall bladder  disease    GERD (gastroesophageal reflux disease)    Glaucoma    Hiatal hernia with gastroesophageal reflux    History of blood transfusion    for Knee replacement   Hyperlipidemia, mild 04/02/2016   Hypertension    Hypothyroidism    Joint pain    Migraine aura without headache    Muscle pain    Muscle stiffness    Nasal congestion    Obesity 04/02/2016   Osteoarthritis    Parotiditis 04/21/2016   Vitamin D deficiency 04/02/2016   Past Surgical History:  Procedure Laterality Date   APPENDECTOMY     BREAST LUMPECTOMY WITH RADIOACTIVE SEED LOCALIZATION Left 01/21/2021   Procedure: LEFT BREAST LUMPECTOMY WITH RADIOACTIVE SEED LOCALIZATION;  Surgeon: [name], MD;  Location: [facility];  Service: General;  Laterality: Left;   cataract surgery  2008   CHOLECYSTECTOMY  2010   JOINT REPLACEMENT Left    2012   MASTOIDECTOMY     POSTERIOR CERVICAL FUSION/FORAMINOTOMY N/A 04/27/2017   Procedure: CERVICAL FIVE-SIX  OPEN REDUCTION OF FRACTURE, CERVICAL THREE-SEVEN  DORSAL FIXATION AND FUSION;  Surgeon: Ditty, Kevan Ny, MD;  Location: [facility];  Service: Neurosurgery;  Laterality: N/A;   SHOULDER ARTHROSCOPY Bilateral prior to 2010   [place] Right 03/22/2015   Procedure: RIGHT TOTAL HIP ARTHROPLASTY ANTERIOR APPROACH;  Surgeon: Dorna Leitz, MD;  Location: [facility];  Service:  Orthopedics;  Laterality: Right;   TOTAL KNEE ARTHROPLASTY Bilateral 2007   TUBAL LIGATION     Family History  Problem Relation Age of Onset   Dementia Mother    Hypertension Mother    Obesity Mother    Heart disease Father    Hypertension Father    Stroke Father    Diabetes Father    Hyperlipidemia Father    Alcoholism Father    Obesity Father    Arthritis Daughter    Cancer Maternal Grandfather        colon cancer   Social History   Socioeconomic History   Marital status: Widowed    Spouse name: Not on file   Number of children: Not on file   Years  of education: Not on file   Highest education level: Not on file  Occupational History   Occupation: Retired  Tobacco Use   Smoking status: Never   Smokeless tobacco: Never   Tobacco comments:    Stopped 50 years ago.   Vaping Use   Vaping Use: Never used  Substance and Sexual Activity   Alcohol use: Yes    Alcohol/week: 0.0 standard drinks of alcohol    Comment: socially 3-4x/wk   Drug use: No   Sexual activity: Not on file  Other Topics Concern   Not on file  Social History Narrative   Lives at [facility], widowed 7 years, no dietary restrictions. Retired from neuropsychiatry work.    Social Determinants of Health   Financial Resource Strain: Low Risk  (08/22/2021)   Overall Financial Resource Strain (CARDIA)    Difficulty of Paying Living Expenses: Not very hard  Food Insecurity: No Food Insecurity (12/18/2020)   Hunger Vital Sign    Worried About Running Out of Food in the Last Year: Never true    Ran Out of Food in the Last Year: Never true  Transportation Needs: No Transportation Needs (12/18/2020)   PRAPARE - Hydrologist (Medical): No    Lack of Transportation (Non-Medical): No  Physical Activity: Inactive (08/22/2021)   Exercise Vital Sign    Days of Exercise per Week: 0 days    Minutes of Exercise per Session: 0 min  Stress: Not on file  Social Connections: Not on file  Intimate Partner Violence: Not on file   Outpatient Medications Prior to Visit  Medication Sig Dispense Refill   anastrozole (ARIMIDEX) 1 MG tablet Take 1 tablet (1 mg total) by mouth daily. Start 04/28/2021 90 tablet 4   Biotin 10000 MCG TABS Take 1 tablet by mouth daily.     brimonidine (ALPHAGAN) 0.2 % ophthalmic solution Place 1 drop into both eyes 2 (two) times daily. 5 mL 12   Calcium Carb-Cholecalciferol 600-10 MG-MCG TABS Take 1 tablet by mouth daily.     COVID-19 mRNA bivalent vaccine, Moderna, (MODERNA COVID-19 BIVAL BOOSTER) 50 MCG/0.5ML injection Inject  into the muscle. 0.5 mL 0   famotidine (PEPCID) 40 MG tablet TAKE 1 TABLET BY MOUTH AT  BEDTIME AS NEEDED 90 tablet 3   levocetirizine (XYZAL) 5 MG tablet Take 1 tablet (5 mg total) by mouth every evening. 30 tablet 3   levothyroxine (SYNTHROID) 125 MCG tablet Take 1 tablet (125 mcg total) by mouth daily before breakfast. 90 tablet 1   losartan (COZAAR) 50 MG tablet TAKE 1 TABLET BY MOUTH  DAILY 90 tablet 3   MAGNESIUM-POTASSIUM PO Take 1 tablet by mouth daily.     mirabegron ER (MYRBETRIQ)  50 MG TB24 tablet Take 1 tablet (50 mg total) by mouth daily. 90 tablet 1   Multiple Vitamins-Minerals (MULTIVITAMIN WITH MINERALS) tablet Take 1 tablet by mouth daily.     Multiple Vitamins-Minerals (PRESERVISION AREDS PO) Take 1 tablet by mouth daily.     omeprazole (PRILOSEC) 20 MG capsule Take 1 capsule (20 mg total) by mouth daily. 90 capsule 1   polyethylene glycol (MIRALAX / GLYCOLAX) packet Take 17 g by mouth daily. (Patient taking differently: Take 17 g by mouth daily as needed.) 14 each 0   rosuvastatin (CRESTOR) 10 MG tablet Take 1 tablet (10 mg total) by mouth daily. 90 tablet 3   Vitamin D, Cholecalciferol, 25 MCG (1000 UT) CAPS Take 1 capsule by mouth daily.     No facility-administered medications prior to visit.   No Known Allergies  Review of Systems  Constitutional:  Positive for chills.  HENT:  Positive for congestion.       Objective:    Physical Exam Constitutional:      General: She is not in acute distress.    Appearance: Normal appearance. She is not ill-appearing.  HENT:     Head: Normocephalic and atraumatic.     Right Ear: External ear normal.     Left Ear: External ear normal.     Mouth/Throat:     Mouth: Mucous membranes are moist.     Pharynx: Oropharynx is clear.  Eyes:     Extraocular Movements: Extraocular movements intact.     Pupils: Pupils are equal, round, and reactive to light.  Cardiovascular:     Rate and Rhythm: Normal rate and regular rhythm.      Pulses: Normal pulses.     Heart sounds: Normal heart sounds. No murmur heard.    No gallop.  Pulmonary:     Effort: Pulmonary effort is normal. No respiratory distress.     Breath sounds: Normal breath sounds. No wheezing or rales.  Abdominal:     General: Bowel sounds are normal.  Skin:    General: Skin is warm and dry.  Neurological:     Mental Status: She is alert and oriented to person, place, and time.  Psychiatric:        Mood and Affect: Mood normal.        Behavior: Behavior normal.        Judgment: Judgment normal.    BP 128/78 (BP Location: Right Arm, Patient Position: Sitting, Cuff Size: Normal)   Pulse 84   Temp (!) 84 F (28.9 C) (Oral)   Resp 16   Ht '5\' 6"'$  (1.676 m)   Wt 213 lb 9.6 oz (96.9 kg)   SpO2 93%   BMI 34.48 kg/m  Wt Readings from Last 3 Encounters:  06/04/22 213 lb 9.6 oz (96.9 kg)  04/15/22 212 lb (96.2 kg)  04/06/22 211 lb 12.8 oz (96.1 kg)   Diabetic Foot Exam - Simple   No data filed    Lab Results  Component Value Date   WBC 8.2 03/02/2022   HGB 15.8 (H) 03/02/2022   HCT 45.6 03/02/2022   PLT 238 03/02/2022   GLUCOSE 149 (H) 03/02/2022   CHOL 183 02/05/2022   TRIG 108.0 02/05/2022   HDL 59.60 02/05/2022   LDLDIRECT 138.0 05/15/2019   LDLCALC 101 (H) 02/05/2022   ALT 16 02/05/2022   AST 17 02/05/2022   NA 133 (L) 03/02/2022   K 4.2 03/02/2022   CL 97 (L) 03/02/2022   CREATININE 0.71  03/02/2022   BUN 9 03/02/2022   CO2 25 03/02/2022   TSH 3.76 02/05/2022   INR 1.05 04/28/2017   HGBA1C 6.4 02/05/2022   Lab Results  Component Value Date   TSH 3.76 02/05/2022   Lab Results  Component Value Date   WBC 8.2 03/02/2022   HGB 15.8 (H) 03/02/2022   HCT 45.6 03/02/2022   MCV 93.3 03/02/2022   PLT 238 03/02/2022   Lab Results  Component Value Date   NA 133 (L) 03/02/2022   K 4.2 03/02/2022   CO2 25 03/02/2022   GLUCOSE 149 (H) 03/02/2022   BUN 9 03/02/2022   CREATININE 0.71 03/02/2022   BILITOT 0.4 02/05/2022   ALKPHOS  101 02/05/2022   AST 17 02/05/2022   ALT 16 02/05/2022   PROT 6.3 02/05/2022   ALBUMIN 4.2 02/05/2022   CALCIUM 9.1 03/02/2022   ANIONGAP 11 03/02/2022   GFR 72.46 02/05/2022   Lab Results  Component Value Date   CHOL 183 02/05/2022   Lab Results  Component Value Date   HDL 59.60 02/05/2022   Lab Results  Component Value Date   LDLCALC 101 (H) 02/05/2022   Lab Results  Component Value Date   TRIG 108.0 02/05/2022   Lab Results  Component Value Date   CHOLHDL 3 02/05/2022   Lab Results  Component Value Date   HGBA1C 6.4 02/05/2022      Assessment & Plan:   Problem List Items Addressed This Visit     Hypertension    Well controlled, no changes to meds. Encouraged heart healthy diet such as the DASH diet and exercise as tolerated.       Relevant Orders   CBC with Differential/Platelet   Comprehensive metabolic panel   Lipid panel   TSH   Hypothyroidism    On Levothyroxine, continue to monitor      Vitamin D deficiency    Supplement and monitor      Relevant Orders   VITAMIN D 25 Hydroxy (Vit-D Deficiency, Fractures)   Hyperlipidemia, mild    Encourage heart healthy diet such as MIND or DASH diet, increase exercise, avoid trans fats, simple carbohydrates and processed foods, consider a krill or fish or flaxseed oil cap daily. Tolerating Rosuvastatin      Physical deconditioning    She suffered some rib fractures this year and she has less active leading to more debility but she is trying to increase her activity levels at this time      Hyperglycemia    hgba1c acceptable, minimize simple carbs. Increase exercise as tolerated. Continue current meds, flu shot given today      Relevant Orders   Hemoglobin A1c   Urinary incontinence - Primary   Relevant Orders   Ambulatory referral to Urogynecology   Right hip pain   Relevant Orders   Ambulatory referral to Orthopedic Surgery   Other Visit Diagnoses     Need for influenza vaccination        Relevant Orders   Flu Vaccine QUAD High Dose(Fluad) (Completed)      No orders of the defined types were placed in this encounter.  I, Penni Homans, MD, personally preformed the services described in this documentation.  All medical record entries made by the scribe were at my direction and in my presence.  I have reviewed the chart and discharge instructions (if applicable) and agree that the record reflects my personal performance and is accurate and complete. 06/04/2022  I,Mohammed Iqbal,acting as a scribe  for Penni Homans, MD.,have documented all relevant documentation on the behalf of Penni Homans, MD,as directed by  Penni Homans, MD while in the presence of Penni Homans, MD.  Penni Homans, MD

## 2022-06-04 NOTE — Patient Instructions (Addendum)
RSV (respiratory syncitial virus) vaccine at pharmacy, [place] booster when new version out late September At pharmacy High dose flu shot mid Sept to mid Oct   Tetanus due 2025 unless injured then take early Consider spacing shots by 2 weeks if not taken together  Plain Mucinex/Guaifenasin twice daily Flonase in am and nasal saline in pm Hydrate well 60-80 ounces of fluids  Urinary Incontinence Urinary incontinence refers to a condition in which a person is unable to control where and when to pass urine. A person with this condition will urinate involuntarily. This means that the person urinates when he or she does not mean to. What are the causes? This condition may be caused by: Medicines. Infections. Constipation. Overactive bladder muscles. Weak bladder muscles. Weak pelvic floor muscles. These muscles provide support for the bladder, intestine, and, in women, the uterus. Enlarged prostate in men. The prostate is a gland near the bladder. When it gets too big, it can pinch the urethra. With the urethra blocked, the bladder can weaken and lose the ability to empty properly. Surgery. Emotional factors, such as anxiety, stress, or post-traumatic stress disorder (PTSD). Spinal cord injury, nerve injury, or other neurological conditions. Pelvic organ prolapse. This happens in women when organs move out of place and into the vagina. This movement can prevent the bladder and urethra from working properly. What increases the risk? The following factors may make you more likely to develop this condition: Age. The older you are, the higher the risk. Obesity. Being physically inactive. Pregnancy and childbirth. Menopause. Diseases that affect the nerves or spinal cord. Long-term, or chronic, coughing. This can increase pressure on the bladder and pelvic floor muscles. What are the signs or symptoms? Symptoms may vary depending on the type of urinary incontinence you have. They  include: A sudden urge to urinate, and passing urine involuntarily before you can get to a bathroom (urge incontinence). Suddenly passing urine when doing activities that force urine to pass, such as coughing, laughing, exercising, or sneezing (stress incontinence). Needing to urinate often but urinating only a small amount, or constantly dribbling urine (overflow incontinence). Urinating because you cannot get to the bathroom in time due to a physical disability, such as arthritis or injury, or due to a communication or thinking problem, such as Alzheimer's disease (functional incontinence). How is this diagnosed? This condition may be diagnosed based on: Your medical history. A physical exam. Tests, such as: Urine tests. X-rays of your kidney and bladder. Ultrasound. CT scan. Cystoscopy. In this procedure, a health care provider inserts a tube with a light and camera (cystoscope) through the urethra and into the bladder to check for problems. Urodynamic testing. These tests assess how well the bladder, urethra, and sphincter can store and release urine. There are different types of urodynamic tests, and they vary depending on what the test is measuring. To help diagnose your condition, your health care provider may recommend that you keep a log of when you urinate and how much you urinate. How is this treated? Treatment for this condition depends on the type of incontinence that you have and its cause. Treatment may include: Lifestyle changes, such as: Quitting smoking. Maintaining a healthy weight. Staying active. Try to get 150 minutes of moderate-intensity exercise every week. Ask your health care provider which activities are safe for you. Eating a healthy diet. Avoid high-fat foods, like fried foods. Avoid refined carbohydrates like white bread and white rice. Limit how much alcohol and caffeine you drink. Increase  your fiber intake. Healthy sources of fiber include beans, whole  grains, and fresh fruits and vegetables. Behavioral changes, such as: Pelvic floor muscle exercises. Bladder training, such as lengthening the amount of time between bathroom breaks, or using the bathroom at regular intervals. Using techniques to suppress bladder urges. This can include distraction techniques or controlled breathing exercises. Medicines, such as: Medicines to relax the bladder muscles and prevent bladder spasms. Medicines to help slow or prevent the growth of a man's prostate. Botox injections. These can help relax the bladder muscles. Treatments, such as: Using pulses of electricity to help change bladder reflexes (electrical nerve stimulation). For women, using a medical device to prevent urine leaks. This is a small, tampon-like, disposable device that is inserted into the urethra. Injecting collagen or carbon beads (bulking agents) into the urinary sphincter. These can help thicken tissue and close the bladder opening. Surgery. Follow these instructions at home: Lifestyle Limit alcohol and caffeine. These can fill your bladder quickly and irritate it. Keep yourself clean to help prevent odors and skin damage. Ask your health care provider about special skin creams and cleansers that can protect the skin from urine. Consider wearing pads or adult diapers. Make sure to change them regularly, and always change them right after experiencing incontinence. General instructions Take over-the-counter and prescription medicines only as told by your health care provider. Use the bathroom about every 3-4 hours, even if you do not feel the need to urinate. Try to empty your bladder completely every time. After urinating, wait a minute. Then try to urinate again. Make sure you are in a relaxed position while urinating. If your incontinence is caused by nerve problems, keep a log of the medicines you take and the times you go to the bathroom. Keep all follow-up visits. This is  important. Where to find more information [employer] of Diabetes and Digestive and Kidney Diseases: [employer] American Urology Association: www.urologyhealth.org Contact a health care provider if: You have pain that gets worse. Your incontinence gets worse. Get help right away if: You have a fever or chills. You are unable to urinate. You have redness in your groin area or down your legs. Summary Urinary incontinence refers to a condition in which a person is unable to control where and when to pass urine. This condition may be caused by medicines, infection, weak bladder muscles, weak pelvic floor muscles, enlargement of the prostate (in men), or surgery. Factors such as older age, obesity, pregnancy and childbirth, menopause, neurological diseases, and chronic coughing may increase your risk for developing this condition. Types of urinary incontinence include urge incontinence, stress incontinence, overflow incontinence, and functional incontinence. This condition is usually treated first with lifestyle and behavioral changes, such as quitting smoking, eating a healthier diet, and doing regular pelvic floor exercises. Other treatment options include medicines, bulking agents, medical devices, electrical nerve stimulation, or surgery. This information is not intended to replace advice given to you by your health care provider. Make sure you discuss any questions you have with your health care provider. Document Revised: 04/05/2020 Document Reviewed: 04/05/2020 Elsevier Patient Education  [place].

## 2022-06-04 NOTE — Assessment & Plan Note (Signed)
She suffered some rib fractures this year and she has less active leading to more debility but she is trying to increase her activity levels at this time

## 2022-06-05 ENCOUNTER — Other Ambulatory Visit (INDEPENDENT_AMBULATORY_CARE_PROVIDER_SITE_OTHER): Payer: Medicare Other

## 2022-06-05 DIAGNOSIS — R739 Hyperglycemia, unspecified: Secondary | ICD-10-CM

## 2022-06-05 LAB — COMPREHENSIVE METABOLIC PANEL
ALT: 17 U/L (ref 0–35)
AST: 16 U/L (ref 0–37)
Albumin: 4 g/dL (ref 3.5–5.2)
Alkaline Phosphatase: 98 U/L (ref 39–117)
BUN: 17 mg/dL (ref 6–23)
CO2: 28 mEq/L (ref 19–32)
Calcium: 8.8 mg/dL (ref 8.4–10.5)
Chloride: 100 mEq/L (ref 96–112)
Creatinine, Ser: 0.82 mg/dL (ref 0.40–1.20)
GFR: 63.91 mL/min (ref >60.00)
Glucose, Bld: 115 mg/dL — ABNORMAL HIGH (ref 70–99)
Potassium: 4.5 mEq/L (ref 3.5–5.1)
Sodium: 136 mEq/L (ref 135–145)
Total Bilirubin: 0.4 mg/dL (ref 0.2–1.2)
Total Protein: 6.6 g/dL (ref 6.0–8.3)

## 2022-06-05 LAB — CBC WITH DIFFERENTIAL/PLATELET
Basophils Absolute: 0 10*3/uL (ref 0.0–0.1)
Basophils Relative: 0.6 % (ref 0.0–3.0)
Eosinophils Absolute: 0.1 10*3/uL (ref 0.0–0.7)
Eosinophils Relative: 2.2 % (ref 0.0–5.0)
HCT: 38.1 % (ref 36.0–46.0)
Hemoglobin: 13 g/dL (ref 12.0–15.0)
Lymphocytes Relative: 21.6 % (ref 12.0–46.0)
Lymphs Abs: 1.1 10*3/uL (ref 0.7–4.0)
MCHC: 34 g/dL (ref 30.0–36.0)
MCV: 95 fl (ref 78.0–100.0)
Monocytes Absolute: 0.6 10*3/uL (ref 0.1–1.0)
Monocytes Relative: 11.7 % (ref 3.0–12.0)
Neutro Abs: 3.1 10*3/uL (ref 1.4–7.7)
Neutrophils Relative %: 63.9 % (ref 43.0–77.0)
Platelets: 221 10*3/uL (ref 150.0–400.0)
RBC: 4.01 Mil/uL (ref 3.87–5.11)
RDW: 13.3 % (ref 11.5–15.5)
WBC: 4.9 10*3/uL (ref 4.0–10.5)

## 2022-06-05 LAB — VITAMIN D 25 HYDROXY (VIT D DEFICIENCY, FRACTURES): VITD: 34.14 ng/mL (ref 30.00–100.00)

## 2022-06-05 LAB — LIPID PANEL
Cholesterol: 152 mg/dL (ref 0–200)
HDL: 55.6 mg/dL (ref >39.00)
LDL Cholesterol: 63 mg/dL (ref 0–99)
NonHDL: 96.64
Total CHOL/HDL Ratio: 3
Triglycerides: 168 mg/dL — ABNORMAL HIGH (ref 0.0–149.0)
VLDL: 33.6 mg/dL (ref 0.0–40.0)

## 2022-06-05 LAB — TSH: TSH: 2.29 u[IU]/mL (ref 0.35–5.50)

## 2022-06-08 LAB — HEMOGLOBIN A1C: Hgb A1c MFr Bld: 6.4 % (ref 4.6–6.5)

## 2022-06-10 DIAGNOSIS — M461 Sacroiliitis, not elsewhere classified: Secondary | ICD-10-CM | POA: Diagnosis not present

## 2022-06-15 DIAGNOSIS — R2689 Other abnormalities of gait and mobility: Secondary | ICD-10-CM | POA: Diagnosis not present

## 2022-06-15 DIAGNOSIS — M5459 Other low back pain: Secondary | ICD-10-CM | POA: Diagnosis not present

## 2022-06-15 DIAGNOSIS — R2681 Unsteadiness on feet: Secondary | ICD-10-CM | POA: Diagnosis not present

## 2022-06-15 DIAGNOSIS — M6281 Muscle weakness (generalized): Secondary | ICD-10-CM | POA: Diagnosis not present

## 2022-06-15 DIAGNOSIS — R296 Repeated falls: Secondary | ICD-10-CM | POA: Diagnosis not present

## 2022-06-16 NOTE — Telephone Encounter (Signed)
Patient received a bill for $50 for the form on 06/03/22 to be filled out and patient is not aware of what type of form or why she is being billed $50 for it. Advised patient it was a physical therapy form, but patient is still unsure of what the fee is for. Please call her to advise.

## 2022-06-17 DIAGNOSIS — R296 Repeated falls: Secondary | ICD-10-CM | POA: Diagnosis not present

## 2022-06-17 DIAGNOSIS — R2681 Unsteadiness on feet: Secondary | ICD-10-CM | POA: Diagnosis not present

## 2022-06-17 DIAGNOSIS — M5459 Other low back pain: Secondary | ICD-10-CM | POA: Diagnosis not present

## 2022-06-17 DIAGNOSIS — M6281 Muscle weakness (generalized): Secondary | ICD-10-CM | POA: Diagnosis not present

## 2022-06-17 DIAGNOSIS — R2689 Other abnormalities of gait and mobility: Secondary | ICD-10-CM | POA: Diagnosis not present

## 2022-06-17 NOTE — Telephone Encounter (Signed)
Called pt back to see where the bill come from  To try help with her question. Regarding the bill.

## 2022-06-22 DIAGNOSIS — M461 Sacroiliitis, not elsewhere classified: Secondary | ICD-10-CM | POA: Diagnosis not present

## 2022-06-22 DIAGNOSIS — M25559 Pain in unspecified hip: Secondary | ICD-10-CM | POA: Diagnosis not present

## 2022-06-22 DIAGNOSIS — M7071 Other bursitis of hip, right hip: Secondary | ICD-10-CM | POA: Diagnosis not present

## 2022-06-24 ENCOUNTER — Other Ambulatory Visit (HOSPITAL_BASED_OUTPATIENT_CLINIC_OR_DEPARTMENT_OTHER): Payer: Self-pay

## 2022-06-24 MED ORDER — COVID-19 MRNA 2023-2024 VACCINE (COMIRNATY) 0.3 ML INJECTION
INTRAMUSCULAR | 0 refills | Status: DC
  Filled 2022-06-24: qty 0.3, 1d supply, fill #0

## 2022-06-24 MED ORDER — AREXVY 120 MCG/0.5ML IM SUSR
INTRAMUSCULAR | 0 refills | Status: DC
  Filled 2022-06-24: qty 1, 1d supply, fill #0

## 2022-06-30 DIAGNOSIS — M791 Myalgia, unspecified site: Secondary | ICD-10-CM | POA: Diagnosis not present

## 2022-07-06 ENCOUNTER — Ambulatory Visit: Payer: Medicare Other | Admitting: Hematology and Oncology

## 2022-07-10 DIAGNOSIS — H401412 Capsular glaucoma with pseudoexfoliation of lens, right eye, moderate stage: Secondary | ICD-10-CM | POA: Diagnosis not present

## 2022-07-10 DIAGNOSIS — H16223 Keratoconjunctivitis sicca, not specified as Sjogren's, bilateral: Secondary | ICD-10-CM | POA: Diagnosis not present

## 2022-07-10 DIAGNOSIS — H401421 Capsular glaucoma with pseudoexfoliation of lens, left eye, mild stage: Secondary | ICD-10-CM | POA: Diagnosis not present

## 2022-07-20 ENCOUNTER — Other Ambulatory Visit: Payer: Self-pay

## 2022-07-20 ENCOUNTER — Inpatient Hospital Stay: Payer: Medicare Other | Attending: Hematology and Oncology | Admitting: Hematology and Oncology

## 2022-07-20 VITALS — BP 126/81 | HR 76 | Temp 97.3°F | Resp 16 | Wt 213.1 lb

## 2022-07-20 DIAGNOSIS — Z170 Estrogen receptor positive status [ER+]: Secondary | ICD-10-CM | POA: Diagnosis not present

## 2022-07-20 DIAGNOSIS — Z79811 Long term (current) use of aromatase inhibitors: Secondary | ICD-10-CM | POA: Insufficient documentation

## 2022-07-20 DIAGNOSIS — C50312 Malignant neoplasm of lower-inner quadrant of left female breast: Secondary | ICD-10-CM | POA: Diagnosis not present

## 2022-07-20 DIAGNOSIS — M8580 Other specified disorders of bone density and structure, unspecified site: Secondary | ICD-10-CM | POA: Insufficient documentation

## 2022-07-20 NOTE — Progress Notes (Unsigned)
Catherine Coleman  [phone] [phone]     ID: Catherine Coleman DOB: Feb 13, 1932  MR#: [id number]  NGE#:[id number]  Patient Care Team: [name], MD as PCP - General (Family Medicine) Croitoru, Dani Gobble, MD as PCP - Cardiology (Cardiology) Mauro Kaufmann, RN as Oncology Nurse Navigator Rockwell Germany, RN as Oncology Nurse Navigator Magrinat, Virgie Dad, MD (Inactive) as Consulting Physician (Oncology) [name], MD as Consulting Physician (General Surgery) Kyung Rudd, MD as Consulting Physician (Radiation Oncology) Damian Leavell, MD as Consulting Physician (Obstetrics and Gynecology) Zonia Kief, MD as Consulting Physician (Rehabilitation) Ulla Gallo, MD as Consulting Physician (Dermatology) Cherre Robins, [place] (Pharmacist) Benay Pike, MD OTHER MD:  CHIEF COMPLAINT: Estrogen receptor positive breast cancer  CURRENT TREATMENT: anastrozole   INTERVAL HISTORY:  Catherine Coleman returns today for follow up of her estrogen receptor positive breast cancer.  Since her last visit, she completed adjuvant radiation therapy on 04/02/2021. She is tolerating anastrozole very well.   She has been doing some water exercise 3 times a week. Since last visit, she broke 4 ribs after a fall this summer. No hot flashes or vaginal dryness  Last mammogram March 2023 negative Rest of the pertinent 10 ROS reviewed and negative.    COVID 19 VACCINATION STATUS: Status post Moderna x2 with Moderna then [employer] boosters   HISTORY OF CURRENT ILLNESS: From the original intake note:  Waldine Zenz had routine screening mammography on 11/13/2020 showing a possible abnormality in the bilateral breasts. She underwent bilateral diagnostic mammography with tomography and left breast ultrasonography at [facility] on 12/02/2020 showing: breast density category B; 10 mm left breast mass at 7 o'clock; no suspicious left axillary lymphadenopathy; tow groups  of indeterminate calcifications involving upper-inner and upper-outer right breast, each group spanning up to 6 cm.  Accordingly on 12/11/2020 she proceeded to biopsy of the bilateral breast areas in question. The pathology from this procedure ([id number]) showed:  1. Left Breast  - invasive ductal carcinoma, grade 2.   - Prognostic indicators significant for: estrogen receptor, 95% positive and progesterone receptor, 60% positive, both with strong staining intensity. Proliferation marker Ki67 at 15%. HER2 equivocal by immunohistochemistry (2+), but negative by fluorescent in situ hybridization with a signals ratio 1.34 and number per cell 1.95. 2. Right Breast  - fat necrosis and dystrophic calcifications   Cancer Staging  Malignant neoplasm of lower-inner quadrant of left breast in female, estrogen receptor positive ([place]) Staging form: Breast, AJCC 8th Edition - Clinical stage from 12/18/2020: Stage IA (cT1b, cN0, cM0, G2, ER+, PR+, HER2-) - Signed by Chauncey Cruel, MD on 12/18/2020 Stage prefix: Initial diagnosis Histologic grading system: 3 grade system   The patient's subsequent history is as detailed below.   PAST MEDICAL HISTORY: Past Medical History:  Diagnosis Date   Arthritis    Atrophic rhinitis 04/21/2016   Breast cancer ([place]) 12/2020   left breast IDC   Constipation    Decreased hearing    Diverticulitis    Dry eyes    Fatigue    Floaters in visual field    Gall bladder disease    GERD (gastroesophageal reflux disease)    Glaucoma    Hiatal hernia with gastroesophageal reflux    History of blood transfusion    for Knee replacement   Hyperlipidemia, mild 04/02/2016   Hypertension    Hypothyroidism    Joint pain    Migraine aura without headache    Muscle pain    Muscle  stiffness    Nasal congestion    Obesity 04/02/2016   Osteoarthritis    Parotiditis 04/21/2016   Vitamin D deficiency 04/02/2016    PAST SURGICAL HISTORY: Past Surgical History:  Procedure  Laterality Date   APPENDECTOMY     BREAST LUMPECTOMY WITH RADIOACTIVE SEED LOCALIZATION Left 01/21/2021   Procedure: LEFT BREAST LUMPECTOMY WITH RADIOACTIVE SEED LOCALIZATION;  Surgeon: [name], MD;  Location: [facility];  Service: General;  Laterality: Left;   cataract surgery  2008   CHOLECYSTECTOMY  2010   JOINT REPLACEMENT Left    2012   MASTOIDECTOMY     POSTERIOR CERVICAL FUSION/FORAMINOTOMY N/A 04/27/2017   Procedure: CERVICAL FIVE-SIX  OPEN REDUCTION OF FRACTURE, CERVICAL THREE-SEVEN  DORSAL FIXATION AND FUSION;  Surgeon: Ditty, Kevan Ny, MD;  Location: [facility];  Service: Neurosurgery;  Laterality: N/A;   SHOULDER ARTHROSCOPY Bilateral prior to 2010   [place] Right 03/22/2015   Procedure: RIGHT TOTAL HIP ARTHROPLASTY ANTERIOR APPROACH;  Surgeon: Dorna Leitz, MD;  Location: [facility];  Service: Orthopedics;  Laterality: Right;   TOTAL KNEE ARTHROPLASTY Bilateral 2007   TUBAL LIGATION      FAMILY HISTORY: Family History  Problem Relation Age of Onset   Dementia Mother    Hypertension Mother    Obesity Mother    Heart disease Father    Hypertension Father    Stroke Father    Diabetes Father    Hyperlipidemia Father    Alcoholism Father    Obesity Father    Arthritis Daughter    Cancer Maternal Grandfather        colon cancer   Her father died at age 60 from MI. Her mother died at age 44 from dementia. Catherine Coleman has one brother (and no sisters). She reports colon cancer in her maternal grandfather. There is no family history of breast or ovarian cancer to her knowledge.   GYNECOLOGIC HISTORY:  No LMP recorded. Patient is postmenopausal. Menarche: 88 years old Age at first live birth: 88 years old [place] P 4 LMP date unsure, "50-90?" Contraceptive: used from [phone] HRT never used  Hysterectomy? no BSO? no   SOCIAL HISTORY: (updated 12/2020)  Catherine Coleman is currently retired from working as a Engineer, water (PhD,  retired in 2000). She is widowed. She lives independently by herself at [facility]. Daughter [name], age 76, works with computers in [place], [place]. Daughter [name], age 2, is retired but Musician and lives in [place], [place]. Daughter Denetria Luevanos, age 85, works in Scientist, research (life sciences) estate in [place], [place]. Daughter Harolyn Cocker, age 66, works with computers in [place], [place]. Magda Paganini and [place] graduated from [employer]. Nyra has 5 grandchildren, two of whom are MD's.    ADVANCED DIRECTIVES: in place   HEALTH MAINTENANCE: Social History   Tobacco Use   Smoking status: Never   Smokeless tobacco: Never   Tobacco comments:    Stopped 50 years ago.   Vaping Use   Vaping Use: Never used  Substance Use Topics   Alcohol use: Yes    Alcohol/week: 0.0 standard drinks of alcohol    Comment: socially 3-4x/wk   Drug use: No     Colonoscopy: 2014 (Dr. Stephens November), repeat not indicated (age)  PAP: date unknown  Bone density: 07/2019, -1.7   No Known Allergies   Current Outpatient Medications  Medication Sig Dispense Refill   anastrozole (ARIMIDEX) 1 MG tablet Take 1 tablet (1 mg total) by mouth daily. Start  04/28/2021 90 tablet 4   Biotin 10000 MCG TABS Take 1 tablet by mouth daily.     brimonidine (ALPHAGAN) 0.2 % ophthalmic solution Place 1 drop into both eyes 2 (two) times daily. 5 mL 12   Calcium Carb-Cholecalciferol 600-10 MG-MCG TABS Take 1 tablet by mouth daily.     COVID-19 mRNA bivalent vaccine, Moderna, (MODERNA COVID-19 BIVAL BOOSTER) 50 MCG/0.5ML injection Inject into the muscle. 0.5 mL 0   COVID-19 mRNA vaccine 2023-2024 (COMIRNATY) SUSP injection Inject into the muscle. 0.3 mL 0   famotidine (PEPCID) 40 MG tablet TAKE 1 TABLET BY MOUTH AT  BEDTIME AS NEEDED 90 tablet 3   levocetirizine (XYZAL) 5 MG tablet Take 1 tablet (5 mg total) by mouth every evening. 30 tablet 3   levothyroxine (SYNTHROID) 125 MCG tablet Take 1 tablet (125 mcg total) by mouth daily before  breakfast. 90 tablet 1   losartan (COZAAR) 50 MG tablet TAKE 1 TABLET BY MOUTH  DAILY 90 tablet 3   MAGNESIUM-POTASSIUM PO Take 1 tablet by mouth daily.     mirabegron ER (MYRBETRIQ) 50 MG TB24 tablet Take 1 tablet (50 mg total) by mouth daily. 90 tablet 1   Multiple Vitamins-Minerals (MULTIVITAMIN WITH MINERALS) tablet Take 1 tablet by mouth daily.     Multiple Vitamins-Minerals (PRESERVISION AREDS PO) Take 1 tablet by mouth daily.     omeprazole (PRILOSEC) 20 MG capsule Take 1 capsule (20 mg total) by mouth daily. 90 capsule 1   polyethylene glycol (MIRALAX / GLYCOLAX) packet Take 17 g by mouth daily. (Patient taking differently: Take 17 g by mouth daily as needed.) 14 each 0   rosuvastatin (CRESTOR) 10 MG tablet Take 1 tablet (10 mg total) by mouth daily. 90 tablet 3   RSV vaccine recomb adjuvanted (AREXVY) 120 MCG/0.5ML injection Inject into the muscle. 1 each 0   Vitamin D, Cholecalciferol, 25 MCG (1000 UT) CAPS Take 1 capsule by mouth daily.     No current facility-administered medications for this visit.    OBJECTIVE: White woman who appears stated age  88:   07/20/22 1252  BP: 126/81  Pulse: 76  Resp: 16  Temp: (!) 97.3 F (36.3 C)  SpO2: 93%       Body mass index is 34.39 kg/m.   Wt Readings from Last 3 Encounters:  07/20/22 213 lb 1 oz (96.6 kg)  06/04/22 213 lb 9.6 oz (96.9 kg)  04/15/22 212 lb (96.2 kg)      ECOG FS:1 - Symptomatic but completely ambulatory  Physical Exam Constitutional:      Appearance: Normal appearance.  Chest:     Comments: Bilateral breasts inspected.  No palpable masses or regional adenopathy  Musculoskeletal:     Cervical back: Normal range of motion and neck supple. No rigidity.  Lymphadenopathy:     Cervical: No cervical adenopathy.  Neurological:     Mental Status: She is alert.       LAB RESULTS:  CMP     Component Value Date/Time   NA 136 06/04/2022 1611   NA 137 09/28/2018 1147   K 4.5 06/04/2022 1611   CL 100  06/04/2022 1611   CO2 28 06/04/2022 1611   GLUCOSE 115 (H) 06/04/2022 1611   BUN 17 06/04/2022 1611   BUN 9 09/28/2018 1147   CREATININE 0.82 06/04/2022 1611   CREATININE 0.76 01/02/2022 1331   CALCIUM 8.8 06/04/2022 1611   PROT 6.6 06/04/2022 1611   PROT 6.7 09/28/2018 1147   ALBUMIN  4.0 06/04/2022 1611   ALBUMIN 4.6 09/28/2018 1147   AST 16 06/04/2022 1611   AST 20 01/02/2022 1331   ALT 17 06/04/2022 1611   ALT 19 01/02/2022 1331   ALKPHOS 98 06/04/2022 1611   BILITOT 0.4 06/04/2022 1611   BILITOT 0.3 01/02/2022 1331   GFRNONAA >60 03/02/2022 1307   GFRNONAA >60 01/02/2022 1331   GFRAA 94 09/28/2018 1147    No results found for: "TOTALPROTELP", "ALBUMINELP", "A1GS", "A2GS", "BETS", "BETA2SER", "GAMS", "MSPIKE", "SPEI"  Lab Results  Component Value Date   WBC 4.9 06/04/2022   NEUTROABS 3.1 06/04/2022   HGB 13.0 06/04/2022   HCT 38.1 06/04/2022   MCV 95.0 06/04/2022   PLT 221.0 06/04/2022    No results found for: "LABCA2"  No components found for: "ZGYFVC944"  No results for input(s): "INR" in the last 168 hours.  No results found for: "LABCA2"  No results found for: "HQP591"  No results found for: "CAN125"  No results found for: "CAN153"  No results found for: "CA2729"  No components found for: "HGQUANT"  No results found for: "CEA1", "CEA" / No results found for: "CEA1", "CEA"   No results found for: "AFPTUMOR"  No results found for: "CHROMOGRNA"  No results found for: "KPAFRELGTCHN", "LAMBDASER", "KAPLAMBRATIO" (kappa/lambda light chains)  No results found for: "HGBA", "HGBA2QUANT", "HGBFQUANT", "HGBSQUAN" (Hemoglobinopathy evaluation)   No results found for: "LDH"  No results found for: "IRON", "TIBC", "IRONPCTSAT" (Iron and TIBC)  No results found for: "FERRITIN"  Urinalysis    Component Value Date/Time   COLORURINE YELLOW 01/06/2021 1454   APPEARANCEUR Sl Cloudy (A) 01/06/2021 1454   LABSPEC 1.015 01/06/2021 1454   PHURINE 6.0  01/06/2021 1454   GLUCOSEU NEGATIVE 01/06/2021 1454   HGBUR NEGATIVE 01/06/2021 1454   BILIRUBINUR NEGATIVE 01/06/2021 1454   KETONESUR NEGATIVE 01/06/2021 1454   PROTEINUR NEGATIVE 05/14/2017 0757   UROBILINOGEN 0.2 01/06/2021 1454   NITRITE POSITIVE (A) 01/06/2021 1454   LEUKOCYTESUR SMALL (A) 01/06/2021 1454    STUDIES: No results found.   ELIGIBLE FOR AVAILABLE RESEARCH PROTOCOL: no  ASSESSMENT: 88 y.o. [place] woman status post left breast lower inner quadrant biopsy 12/11/2020 for a clinical T1b N0, stage IA invasive ductal carcinoma, grade 2, estrogen and progesterone receptor positive, HER-2 not amplified, with an MIB-1 of 15%  (1) status post left lumpectomy 01/21/2021 for a pT1c pNX, stage IA invasive ductal carcinoma, grade 2, with negative margins  (2) adjuvant radiation 03/05/2021 through 04/02/2021 Site Technique Total Dose (Gy) Dose per Fx (Gy) Completed Fx Beam Energies  Breast, Left: Breast_Lt 3D 42.56/42.56 2.66 16/16 10X  Breast, Left: Breast_Lt_Bst specialPort 8/8 2 4/4 9E, 12E   (3) to start anastrozole 04/28/2021  (A) bone density 07/17/2019 at the [facility] shows a T score of -1.7   PLAN: Ms. Catherine Coleman is tolerating anastrozole very well.  She denies any complaints.  No changes in the breast.  Physical examination today without any concerns for recurrence.  Most recent mammogram without any concerns. She should continue anastrozole and return to clinic in about 6 months.  She will continue anastrozole for total of 5 years.  She knows to call for any other issue that may develop before the next visit  Total encounter time 20 minutes.*   *Total Encounter Time as defined by the Centers for Medicare and Medicaid Services includes, in addition to the face-to-face time of a patient visit (documented in the note above) non-face-to-face time: obtaining and reviewing outside history, ordering and reviewing  medications, tests or procedures, care coordination  (communications with other health care professionals or caregivers) and documentation in the medical record.

## 2022-07-21 ENCOUNTER — Encounter: Payer: Self-pay | Admitting: Hematology and Oncology

## 2022-07-24 ENCOUNTER — Other Ambulatory Visit: Payer: Self-pay | Admitting: Family Medicine

## 2022-07-31 DIAGNOSIS — Z96649 Presence of unspecified artificial hip joint: Secondary | ICD-10-CM | POA: Diagnosis not present

## 2022-07-31 DIAGNOSIS — M791 Myalgia, unspecified site: Secondary | ICD-10-CM | POA: Diagnosis not present

## 2022-07-31 DIAGNOSIS — M47816 Spondylosis without myelopathy or radiculopathy, lumbar region: Secondary | ICD-10-CM | POA: Diagnosis not present

## 2022-08-21 ENCOUNTER — Other Ambulatory Visit: Payer: Self-pay | Admitting: Family Medicine

## 2022-08-21 DIAGNOSIS — E038 Other specified hypothyroidism: Secondary | ICD-10-CM

## 2022-09-02 ENCOUNTER — Other Ambulatory Visit: Payer: Self-pay | Admitting: Family Medicine

## 2022-09-11 ENCOUNTER — Other Ambulatory Visit: Payer: Self-pay | Admitting: Family Medicine

## 2022-09-11 DIAGNOSIS — M4180 Other forms of scoliosis, site unspecified: Secondary | ICD-10-CM | POA: Diagnosis not present

## 2022-09-11 DIAGNOSIS — M47816 Spondylosis without myelopathy or radiculopathy, lumbar region: Secondary | ICD-10-CM | POA: Diagnosis not present

## 2022-09-11 DIAGNOSIS — M25551 Pain in right hip: Secondary | ICD-10-CM | POA: Diagnosis not present

## 2022-10-11 NOTE — Assessment & Plan Note (Signed)
Well controlled, no changes to meds. Encouraged heart healthy diet such as the DASH diet and exercise as tolerated.  °

## 2022-10-11 NOTE — Assessment & Plan Note (Signed)
Encourage heart healthy diet such as MIND or DASH diet, increase exercise, avoid trans fats, simple carbohydrates and processed foods, consider a krill or fish or flaxseed oil cap daily.  Tolerating Rosuvastatin 

## 2022-10-11 NOTE — Assessment & Plan Note (Signed)
On Levothyroxine, continue to monitor 

## 2022-10-12 ENCOUNTER — Other Ambulatory Visit (HOSPITAL_BASED_OUTPATIENT_CLINIC_OR_DEPARTMENT_OTHER): Payer: Self-pay

## 2022-10-12 ENCOUNTER — Ambulatory Visit (INDEPENDENT_AMBULATORY_CARE_PROVIDER_SITE_OTHER): Payer: Medicare Other | Admitting: Family Medicine

## 2022-10-12 ENCOUNTER — Ambulatory Visit (HOSPITAL_BASED_OUTPATIENT_CLINIC_OR_DEPARTMENT_OTHER)
Admission: RE | Admit: 2022-10-12 | Discharge: 2022-10-12 | Disposition: A | Discharge status: Home / Self Care | Payer: Medicare Other | Source: Ambulatory Visit | Attending: Family Medicine | Admitting: Family Medicine

## 2022-10-12 VITALS — BP 128/80 | HR 72 | Temp 97.5°F | Resp 16 | Ht 65.0 in | Wt 214.8 lb

## 2022-10-12 DIAGNOSIS — R252 Cramp and spasm: Secondary | ICD-10-CM | POA: Diagnosis not present

## 2022-10-12 DIAGNOSIS — E785 Hyperlipidemia, unspecified: Secondary | ICD-10-CM | POA: Diagnosis not present

## 2022-10-12 DIAGNOSIS — E559 Vitamin D deficiency, unspecified: Secondary | ICD-10-CM | POA: Diagnosis not present

## 2022-10-12 DIAGNOSIS — M19042 Primary osteoarthritis, left hand: Secondary | ICD-10-CM | POA: Diagnosis not present

## 2022-10-12 DIAGNOSIS — E669 Obesity, unspecified: Secondary | ICD-10-CM

## 2022-10-12 DIAGNOSIS — R739 Hyperglycemia, unspecified: Secondary | ICD-10-CM | POA: Diagnosis not present

## 2022-10-12 DIAGNOSIS — E038 Other specified hypothyroidism: Secondary | ICD-10-CM | POA: Diagnosis not present

## 2022-10-12 DIAGNOSIS — I10 Essential (primary) hypertension: Secondary | ICD-10-CM

## 2022-10-12 DIAGNOSIS — S6992XA Unspecified injury of left wrist, hand and finger(s), initial encounter: Secondary | ICD-10-CM | POA: Diagnosis not present

## 2022-10-12 LAB — MAGNESIUM: Magnesium: 2.3 mg/dL (ref 1.5–2.5)

## 2022-10-12 LAB — LIPID PANEL
Cholesterol: 216 mg/dL — ABNORMAL HIGH (ref 0–200)
HDL: 85.4 mg/dL (ref >39.00)
LDL Cholesterol: 115 mg/dL — ABNORMAL HIGH (ref 0–99)
NonHDL: 130.38
Total CHOL/HDL Ratio: 3
Triglycerides: 77 mg/dL (ref 0.0–149.0)
VLDL: 15.4 mg/dL (ref 0.0–40.0)

## 2022-10-12 LAB — CBC WITH DIFFERENTIAL/PLATELET
Basophils Absolute: 0 10*3/uL (ref 0.0–0.1)
Basophils Relative: 0.5 % (ref 0.0–3.0)
Eosinophils Absolute: 0.1 10*3/uL (ref 0.0–0.7)
Eosinophils Relative: 1.4 % (ref 0.0–5.0)
HCT: 41 % (ref 36.0–46.0)
Hemoglobin: 14.1 g/dL (ref 12.0–15.0)
Lymphocytes Relative: 25.4 % (ref 12.0–46.0)
Lymphs Abs: 1.3 10*3/uL (ref 0.7–4.0)
MCHC: 34.4 g/dL (ref 30.0–36.0)
MCV: 96.3 fl (ref 78.0–100.0)
Monocytes Absolute: 0.6 10*3/uL (ref 0.1–1.0)
Monocytes Relative: 11.1 % (ref 3.0–12.0)
Neutro Abs: 3.1 10*3/uL (ref 1.4–7.7)
Neutrophils Relative %: 61.6 % (ref 43.0–77.0)
Platelets: 237 10*3/uL (ref 150.0–400.0)
RBC: 4.26 Mil/uL (ref 3.87–5.11)
RDW: 13.8 % (ref 11.5–15.5)
WBC: 5.1 10*3/uL (ref 4.0–10.5)

## 2022-10-12 LAB — COMPREHENSIVE METABOLIC PANEL
ALT: 16 U/L (ref 0–35)
AST: 18 U/L (ref 0–37)
Albumin: 4.6 g/dL (ref 3.5–5.2)
Alkaline Phosphatase: 95 U/L (ref 39–117)
BUN: 21 mg/dL (ref 6–23)
CO2: 28 mEq/L (ref 19–32)
Calcium: 9.5 mg/dL (ref 8.4–10.5)
Chloride: 98 mEq/L (ref 96–112)
Creatinine, Ser: 0.77 mg/dL (ref 0.40–1.20)
GFR: 68.76 mL/min (ref >60.00)
Glucose, Bld: 116 mg/dL — ABNORMAL HIGH (ref 70–99)
Potassium: 5 mEq/L (ref 3.5–5.1)
Sodium: 135 mEq/L (ref 135–145)
Total Bilirubin: 0.4 mg/dL (ref 0.2–1.2)
Total Protein: 7 g/dL (ref 6.0–8.3)

## 2022-10-12 LAB — TSH: TSH: 4.17 u[IU]/mL (ref 0.35–5.50)

## 2022-10-12 LAB — HEMOGLOBIN A1C: Hgb A1c MFr Bld: 6.5 % (ref 4.6–6.5)

## 2022-10-12 LAB — VITAMIN D 25 HYDROXY (VIT D DEFICIENCY, FRACTURES): VITD: 41.92 ng/mL (ref 30.00–100.00)

## 2022-10-12 MED ORDER — ROSUVASTATIN CALCIUM 5 MG PO TABS: 5.0000 mg | ORAL_TABLET | Freq: Every day | ORAL | 1 refills | Status: DC

## 2022-10-12 MED ORDER — CEPHALEXIN 500 MG PO CAPS
500.0000 mg | ORAL_CAPSULE | Freq: Four times a day (QID) | ORAL | 0 refills | Status: DC
  Filled 2022-10-12: qty 40, 10d supply, fill #0

## 2022-10-12 NOTE — Patient Instructions (Signed)

## 2022-10-13 ENCOUNTER — Other Ambulatory Visit: Payer: Self-pay

## 2022-10-13 DIAGNOSIS — R6889 Other general symptoms and signs: Secondary | ICD-10-CM

## 2022-10-14 ENCOUNTER — Other Ambulatory Visit (HOSPITAL_BASED_OUTPATIENT_CLINIC_OR_DEPARTMENT_OTHER): Payer: Self-pay

## 2022-10-18 DIAGNOSIS — R252 Cramp and spasm: Secondary | ICD-10-CM | POA: Insufficient documentation

## 2022-10-18 DIAGNOSIS — S6992XA Unspecified injury of left wrist, hand and finger(s), initial encounter: Secondary | ICD-10-CM | POA: Insufficient documentation

## 2022-10-18 NOTE — Assessment & Plan Note (Signed)
She injured the finger a few weeks ago but recently it has become increasingly red, swollen and painful. Still not severe but given the progressive nature she is started on Cephelexin and xray is obtained referral to hand surgeon is placed

## 2022-10-18 NOTE — Progress Notes (Signed)
Subjective:    Patient ID: Catherine Coleman, female    DOB: [birth year], 89 y.o.   MRN: [id number]  Chief Complaint  Patient presents with   Follow-up    Follow up    HPI Patient is in today for follow up on chronic medical concerns. No recent febrile illness or hospitalizations. Denies CP/palp/SOB/HA/congestion/fevers/GI or GU c/o. Taking meds as prescribed. She injured her left index finger a few weeks ago. She cleaned it well secondary to breaking the skin and thought it was fine but now it is increasingly red, swollen and slightly uncomfortable. No other signs of systemic illness.    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   Constipation    Decreased hearing    Diverticulitis    Dry eyes    Fatigue    Floaters in visual field    Gall bladder disease    GERD (gastroesophageal reflux disease)    Glaucoma    Hiatal hernia with gastroesophageal reflux    History of blood transfusion    for Knee replacement   Hyperlipidemia, mild 04/02/2016   Hypertension    Hypothyroidism    Joint pain    Migraine aura without headache    Muscle pain    Muscle stiffness    Nasal congestion    Obesity 04/02/2016   Osteoarthritis    Parotiditis 04/21/2016   Vitamin D deficiency 04/02/2016    Past Surgical History:  Procedure Laterality Date   APPENDECTOMY     BREAST LUMPECTOMY WITH RADIOACTIVE SEED LOCALIZATION Left 01/21/2021   Procedure: LEFT BREAST LUMPECTOMY WITH RADIOACTIVE SEED LOCALIZATION;  Surgeon: [name], MD;  Location: [facility];  Service: General;  Laterality: Left;   cataract surgery  2008   CHOLECYSTECTOMY  2010   JOINT REPLACEMENT Left    2012   MASTOIDECTOMY     POSTERIOR CERVICAL FUSION/FORAMINOTOMY N/A 04/27/2017   Procedure: CERVICAL FIVE-SIX  OPEN REDUCTION OF FRACTURE, CERVICAL THREE-SEVEN  DORSAL FIXATION AND FUSION;  Surgeon: Ditty, Kevan Ny, MD;   Location: [facility];  Service: Neurosurgery;  Laterality: N/A;   SHOULDER ARTHROSCOPY Bilateral prior to 2010   [place] Right 03/22/2015   Procedure: RIGHT TOTAL HIP ARTHROPLASTY ANTERIOR APPROACH;  Surgeon: Dorna Leitz, MD;  Location: [facility];  Service: Orthopedics;  Laterality: Right;   TOTAL KNEE ARTHROPLASTY Bilateral 2007   TUBAL LIGATION      Family History  Problem Relation Age of Onset   Dementia Mother    Hypertension Mother    Obesity Mother    Heart disease Father    Hypertension Father    Stroke Father    Diabetes Father    Hyperlipidemia Father    Alcoholism Father    Obesity Father    Arthritis Daughter    Cancer Maternal Grandfather        colon cancer    Social History   Socioeconomic History   Marital status: Widowed    Spouse name: Not on file   Number of children: Not on file   Years of education: Not on file   Highest education level: Not on file  Occupational History   Occupation: Retired  Tobacco Use   Smoking status: Never   Smokeless tobacco: Never   Tobacco comments:    Stopped 50 years ago.   Vaping Use   Vaping Use:  Never used  Substance and Sexual Activity   Alcohol use: Yes    Alcohol/week: 0.0 standard drinks of alcohol    Comment: socially 3-4x/wk   Drug use: No   Sexual activity: Not on file  Other Topics Concern   Not on file  Social History Narrative   Lives at [facility], widowed 7 years, no dietary restrictions. Retired from neuropsychiatry work.    Social Determinants of Health   Financial Resource Strain: Low Risk  (08/22/2021)   Overall Financial Resource Strain (CARDIA)    Difficulty of Paying Living Expenses: Not very hard  Food Insecurity: No Food Insecurity (12/18/2020)   Hunger Vital Sign    Worried About Running Out of Food in the Last Year: Never true    Ran Out of Food in the Last Year: Never true  Transportation Needs: No Transportation Needs (12/18/2020)   PRAPARE - Armed forces logistics/support/administrative officer (Medical): No    Lack of Transportation (Non-Medical): No  Physical Activity: Inactive (08/22/2021)   Exercise Vital Sign    Days of Exercise per Week: 0 days    Minutes of Exercise per Session: 0 min  Stress: Not on file  Social Connections: Not on file  Intimate Partner Violence: Not on file    Outpatient Medications Prior to Visit  Medication Sig Dispense Refill   anastrozole (ARIMIDEX) 1 MG tablet Take 1 tablet (1 mg total) by mouth daily. Start 04/28/2021 90 tablet 4   Biotin 10000 MCG TABS Take 1 tablet by mouth daily.     brimonidine (ALPHAGAN) 0.2 % ophthalmic solution Place 1 drop into both eyes 2 (two) times daily. 5 mL 12   Calcium Carb-Cholecalciferol 600-10 MG-MCG TABS Take 1 tablet by mouth daily.     COVID-19 mRNA bivalent vaccine, Moderna, (MODERNA COVID-19 BIVAL BOOSTER) 50 MCG/0.5ML injection Inject into the muscle. 0.5 mL 0   COVID-19 mRNA vaccine 2023-2024 (COMIRNATY) SUSP injection Inject into the muscle. 0.3 mL 0   famotidine (PEPCID) 40 MG tablet TAKE 1 TABLET BY MOUTH AT  BEDTIME AS NEEDED 100 tablet 2   levocetirizine (XYZAL) 5 MG tablet Take 1 tablet (5 mg total) by mouth every evening. 30 tablet 3   levothyroxine (SYNTHROID) 125 MCG tablet TAKE 1 TABLET BY MOUTH DAILY  BEFORE BREAKFAST 100 tablet 2   losartan (COZAAR) 50 MG tablet TAKE 1 TABLET BY MOUTH DAILY 100 tablet 2   MAGNESIUM-POTASSIUM PO Take 1 tablet by mouth daily.     Multiple Vitamins-Minerals (MULTIVITAMIN WITH MINERALS) tablet Take 1 tablet by mouth daily.     Multiple Vitamins-Minerals (PRESERVISION AREDS PO) Take 1 tablet by mouth daily.     MYRBETRIQ 50 MG TB24 tablet TAKE 1 TABLET BY MOUTH DAILY 100 tablet 2   omeprazole (PRILOSEC) 20 MG capsule TAKE 1 CAPSULE BY MOUTH DAILY 100 capsule 2   polyethylene glycol (MIRALAX / GLYCOLAX) packet Take 17 g by mouth daily. (Patient taking differently: Take 17 g by mouth daily as needed.) 14 each 0   RSV vaccine recomb  adjuvanted (AREXVY) 120 MCG/0.5ML injection Inject into the muscle. 1 each 0   Vitamin D, Cholecalciferol, 25 MCG (1000 UT) CAPS Take 1 capsule by mouth daily.     No facility-administered medications prior to visit.    No Known Allergies  Review of Systems  Constitutional:  Negative for fever and malaise/fatigue.  HENT:  Negative for congestion.   Eyes:  Negative for blurred vision.  Respiratory:  Negative for shortness  of breath.   Cardiovascular:  Negative for chest pain, palpitations and leg swelling.  Gastrointestinal:  Negative for abdominal pain, blood in stool and nausea.  Genitourinary:  Negative for dysuria and frequency.  Musculoskeletal:  Positive for joint pain. Negative for falls.  Skin:  Negative for rash.  Neurological:  Negative for dizziness, loss of consciousness and headaches.  Endo/Heme/Allergies:  Negative for environmental allergies.  Psychiatric/Behavioral:  Negative for depression. The patient is not nervous/anxious.        Objective:    Physical Exam Constitutional:      General: She is not in acute distress.    Appearance: Normal appearance. She is well-developed. She is obese. She is not ill-appearing or toxic-appearing.  HENT:     Head: Normocephalic and atraumatic.     Right Ear: External ear normal.     Left Ear: External ear normal.     Nose: Nose normal.  Eyes:     General:        Right eye: No discharge.        Left eye: No discharge.     Conjunctiva/sclera: Conjunctivae normal.  Neck:     Thyroid: No thyromegaly.  Cardiovascular:     Rate and Rhythm: Normal rate and regular rhythm.     Heart sounds: Normal heart sounds. No murmur heard. Pulmonary:     Effort: Pulmonary effort is normal. No respiratory distress.     Breath sounds: Normal breath sounds.  Abdominal:     General: Bowel sounds are normal.     Palpations: Abdomen is soft.     Tenderness: There is no abdominal tenderness. There is no guarding.  Musculoskeletal:         General: Normal range of motion.     Cervical back: Neck supple.  Lymphadenopathy:     Cervical: No cervical adenopathy.  Skin:    General: Skin is warm and dry.  Neurological:     Mental Status: She is alert and oriented to person, place, and time.  Psychiatric:        Mood and Affect: Mood normal.        Behavior: Behavior normal.        Thought Content: Thought content normal.        Judgment: Judgment normal.     BP 128/80 (BP Location: Right Arm, Patient Position: Sitting, Cuff Size: Normal)   Pulse 72   Temp (!) 97.5 F (36.4 C) (Oral)   Resp 16   Ht '5\' 5"'$  (1.651 m)   Wt 214 lb 12.8 oz (97.4 kg)   SpO2 95%   BMI 35.74 kg/m  Wt Readings from Last 3 Encounters:  10/12/22 214 lb 12.8 oz (97.4 kg)  07/20/22 213 lb 1 oz (96.6 kg)  06/04/22 213 lb 9.6 oz (96.9 kg)    Diabetic Foot Exam - Simple   No data filed    Lab Results  Component Value Date   WBC 5.1 10/12/2022   HGB 14.1 10/12/2022   HCT 41.0 10/12/2022   PLT 237.0 10/12/2022   GLUCOSE 116 (H) 10/12/2022   CHOL 216 (H) 10/12/2022   TRIG 77.0 10/12/2022   HDL 85.40 10/12/2022   LDLDIRECT 138.0 05/15/2019   LDLCALC 115 (H) 10/12/2022   ALT 16 10/12/2022   AST 18 10/12/2022   NA 135 10/12/2022   K 5.0 10/12/2022   CL 98 10/12/2022   CREATININE 0.77 10/12/2022   BUN 21 10/12/2022   CO2 28 10/12/2022   TSH 4.17  10/12/2022   INR 1.05 04/28/2017   HGBA1C 6.5 10/12/2022    Lab Results  Component Value Date   TSH 4.17 10/12/2022   Lab Results  Component Value Date   WBC 5.1 10/12/2022   HGB 14.1 10/12/2022   HCT 41.0 10/12/2022   MCV 96.3 10/12/2022   PLT 237.0 10/12/2022   Lab Results  Component Value Date   NA 135 10/12/2022   K 5.0 10/12/2022   CO2 28 10/12/2022   GLUCOSE 116 (H) 10/12/2022   BUN 21 10/12/2022   CREATININE 0.77 10/12/2022   BILITOT 0.4 10/12/2022   ALKPHOS 95 10/12/2022   AST 18 10/12/2022   ALT 16 10/12/2022   PROT 7.0 10/12/2022   ALBUMIN 4.6 10/12/2022    CALCIUM 9.5 10/12/2022   ANIONGAP 11 03/02/2022   GFR 68.76 10/12/2022   Lab Results  Component Value Date   CHOL 216 (H) 10/12/2022   Lab Results  Component Value Date   HDL 85.40 10/12/2022   Lab Results  Component Value Date   LDLCALC 115 (H) 10/12/2022   Lab Results  Component Value Date   TRIG 77.0 10/12/2022   Lab Results  Component Value Date   CHOLHDL 3 10/12/2022   Lab Results  Component Value Date   HGBA1C 6.5 10/12/2022       Assessment & Plan:  Other specified hypothyroidism Assessment & Plan: On Levothyroxine, continue to monitor  Orders: -     TSH  Primary hypertension Assessment & Plan: Well controlled, no changes to meds. Encouraged heart healthy diet such as the DASH diet and exercise as tolerated.   Orders: -     CBC with Differential/Platelet -     Comprehensive metabolic panel  Hyperlipidemia, mild Assessment & Plan: Encourage heart healthy diet such as MIND or DASH diet, increase exercise, avoid trans fats, simple carbohydrates and processed foods, consider a krill or fish or flaxseed oil cap daily. Tolerating Rosuvastatin  Orders: -     Lipid panel  Hyperglycemia Assessment & Plan: hgba1c acceptable, minimize simple carbs. Increase exercise as tolerated.   Orders: -     Hemoglobin A1c  Vitamin D deficiency Assessment & Plan: Supplement and monitor   Orders: -     VITAMIN D 25 Hydroxy (Vit-D Deficiency, Fractures)  Muscle cramps Assessment & Plan: Hydrate and monitor   Orders: -     Magnesium  Obesity, unspecified classification, unspecified obesity type, unspecified whether serious comorbidity present Assessment & Plan: Referred to bariatric medicine. Encouraged DASH or MIND diet, decrease po intake and increase exercise as tolerated. Needs 7-8 hours of sleep nightly. Avoid trans fats, eat small, frequent meals every 4-5 hours with lean proteins, complex carbs and healthy fats. Minimize simple carbs, high fat foods and  processed foods   Orders: -     Amb Ref to Medical Weight Management  Injury of index finger, left, initial encounter Assessment & Plan: She injured the finger a few weeks ago but recently it has become increasingly red, swollen and painful. Still not severe but given the progressive nature she is started on Cephelexin and xray is obtained referral to hand surgeon is placed  Orders: -     DG Finger Index Left; Future -     Ambulatory referral to Hand Surgery  Other orders -     Rosuvastatin Calcium; Take 1 tablet (5 mg total) by mouth daily.  Dispense: 90 tablet; Refill: 1 -     Cephalexin; Take 1 capsule (500 mg  total) by mouth 4 (four) times daily.  Dispense: 40 capsule; Refill: 0    Penni Homans, MD

## 2022-10-18 NOTE — Assessment & Plan Note (Addendum)
hgba1c acceptable, minimize simple carbs. Increase exercise as tolerated.  

## 2022-10-18 NOTE — Assessment & Plan Note (Signed)
Supplement and monitor 

## 2022-10-18 NOTE — Assessment & Plan Note (Signed)
Hydrate and monitor 

## 2022-10-18 NOTE — Assessment & Plan Note (Signed)
Referred to bariatric medicine. Encouraged DASH or MIND diet, decrease po intake and increase exercise as tolerated. Needs 7-8 hours of sleep nightly. Avoid trans fats, eat small, frequent meals every 4-5 hours with lean proteins, complex carbs and healthy fats. Minimize simple carbs, high fat foods and processed foods

## 2022-10-19 ENCOUNTER — Encounter: Payer: Self-pay | Admitting: Nurse Practitioner

## 2022-10-19 ENCOUNTER — Ambulatory Visit (INDEPENDENT_AMBULATORY_CARE_PROVIDER_SITE_OTHER): Payer: Medicare Other | Admitting: Nurse Practitioner

## 2022-10-19 VITALS — BP 152/84 | HR 72 | Temp 97.7°F | Ht 65.0 in | Wt 212.0 lb

## 2022-10-19 DIAGNOSIS — E038 Other specified hypothyroidism: Secondary | ICD-10-CM | POA: Diagnosis not present

## 2022-10-19 DIAGNOSIS — E785 Hyperlipidemia, unspecified: Secondary | ICD-10-CM | POA: Diagnosis not present

## 2022-10-19 DIAGNOSIS — I10 Essential (primary) hypertension: Secondary | ICD-10-CM

## 2022-10-19 DIAGNOSIS — E119 Type 2 diabetes mellitus without complications: Secondary | ICD-10-CM

## 2022-10-19 DIAGNOSIS — Z6835 Body mass index (BMI) 35.0-35.9, adult: Secondary | ICD-10-CM

## 2022-10-19 DIAGNOSIS — Z0289 Encounter for other administrative examinations: Secondary | ICD-10-CM

## 2022-10-19 NOTE — Progress Notes (Signed)
Office: [phone]  /  Fax: [phone]   Initial Visit  Catherine Coleman was seen in clinic today to evaluate for obesity. She is interested in losing weight to improve overall health and reduce the risk of weight related complications. She presents today to review program treatment options, initial physical assessment, and evaluation.     She was referred by: PCP  She was seen here last on 07/10/2019.  She lives at [facility].   When asked what else they would like to accomplish? She states: Adopt healthier eating patterns, Improve energy levels and physical activity, Improve existing medical conditions, and Improve quality of life Goal weight 185 lbs  When asked how has your weight affected you? She states: Contributed to orthopedic problems or mobility issues and Having poor endurance  Some associated conditions: Hypertension, Arthritis:hips, back, Hyperlipidemia, Diabetes, GERD, Overactive bladder, and Vitamin D Deficiency  Contributing factors: Family history  Weight promoting medications identified: None  Current nutrition plan: None  Current level of physical activity: None and Other: walking in the pool 3 days per week, planning to start a core/balance class  Current or previous pharmacotherapy: None  Response to medication: Never tried medications   Past medical history includes:   Past Medical History:  Diagnosis Date   Arthritis    Atrophic rhinitis 04/21/2016   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   Constipation    Decreased hearing    Diverticulitis    Dry eyes    Fatigue    Floaters in visual field    Gall bladder disease    GERD (gastroesophageal reflux disease)    Glaucoma    Hiatal hernia with gastroesophageal reflux    History of blood transfusion    for Knee replacement   Hyperlipidemia, mild 04/02/2016   Hypertension    Hypothyroidism    Joint pain    Migraine aura without headache    Muscle pain    Muscle stiffness    Nasal  congestion    Obesity 04/02/2016   Osteoarthritis    Parotiditis 04/21/2016   Vitamin D deficiency 04/02/2016     Objective:   BP (!) 152/84   Pulse 72   Temp 97.7 F (36.5 C)   Ht '5\' 5"'$  (1.651 m)   Wt 212 lb (96.2 kg)   SpO2 95%   BMI 35.28 kg/m  She was weighed on the bioimpedance scale: Body mass index is 35.28 kg/m.  Peak Weight:224lbs  , Body Fat%:48, Visceral Fat Rating:17, Weight trend over the last 12 months: Unchanged  General:  Alert, oriented and cooperative. Patient is in no acute distress.  Respiratory: Normal respiratory effort, no problems with respiration noted  Extremities: Normal range of motion.    Mental Status: Normal mood and affect. Normal behavior. Normal judgment and thought content.   DIAGNOSTIC DATA REVIEWED:  BMET    Component Value Date/Time   NA 135 10/12/2022 1245   NA 137 09/28/2018 1147   K 5.0 10/12/2022 1245   CL 98 10/12/2022 1245   CO2 28 10/12/2022 1245   GLUCOSE 116 (H) 10/12/2022 1245   BUN 21 10/12/2022 1245   BUN 9 09/28/2018 1147   CREATININE 0.77 10/12/2022 1245   CREATININE 0.76 01/02/2022 1331   CALCIUM 9.5 10/12/2022 1245   GFRNONAA >60 03/02/2022 1307   GFRNONAA >60 01/02/2022 1331   GFRAA 94 09/28/2018 1147   Lab Results  Component Value Date   HGBA1C 6.5 10/12/2022   HGBA1C 6.4 10/18/2017  Lab Results  Component Value Date   INSULIN 11.5 06/12/2019   INSULIN 15.5 05/30/2018   CBC    Component Value Date/Time   WBC 5.1 10/12/2022 1245   RBC 4.26 10/12/2022 1245   HGB 14.1 10/12/2022 1245   HGB 13.0 01/02/2022 1331   HCT 41.0 10/12/2022 1245   PLT 237.0 10/12/2022 1245   PLT 197 01/02/2022 1331   MCV 96.3 10/12/2022 1245   MCH 32.3 03/02/2022 1307   MCHC 34.4 10/12/2022 1245   RDW 13.8 10/12/2022 1245   Iron/TIBC/Ferritin/ %Sat No results found for: "IRON", "TIBC", "FERRITIN", "IRONPCTSAT" Lipid Panel     Component Value Date/Time   CHOL 216 (H) 10/12/2022 1245   CHOL 226 (H) 09/28/2018 1147    TRIG 77.0 10/12/2022 1245   HDL 85.40 10/12/2022 1245   HDL 60 09/28/2018 1147   CHOLHDL 3 10/12/2022 1245   VLDL 15.4 10/12/2022 1245   LDLCALC 115 (H) 10/12/2022 1245   LDLCALC 134 (H) 09/28/2018 1147   LDLDIRECT 138.0 05/15/2019 1022   Hepatic Function Panel     Component Value Date/Time   PROT 7.0 10/12/2022 1245   PROT 6.7 09/28/2018 1147   ALBUMIN 4.6 10/12/2022 1245   ALBUMIN 4.6 09/28/2018 1147   AST 18 10/12/2022 1245   AST 20 01/02/2022 1331   ALT 16 10/12/2022 1245   ALT 19 01/02/2022 1331   ALKPHOS 95 10/12/2022 1245   BILITOT 0.4 10/12/2022 1245   BILITOT 0.3 01/02/2022 1331      Component Value Date/Time   TSH 4.17 10/12/2022 1245     Assessment and Plan:  Hyperlipidemia, mild Patient is taking Crestor 5 mg.  Denies side effects.  Continue to follow-up with PCP.  Continue medications as directed.  Last lipids 10/12/2022.  Diabetes mellitus without complication (HCC) Last K2I was 6.5.  Her A1c's over the last year have been between 6.4 and 6.7.  She is not currently on any medications and has not taken any medications in the past.  She is inquiring today about starting a medication.  I told her that we will discuss this in the next couple of visits since she is here for an information session today.  Patient is eager and excited to restart the program and is requesting to be seen as soon as possible.  Primary hypertension BP is elevated today.  She is taking Cozaar 50 mg.  Denies side effects.  Continue follow-up with PCP.  Continue medications as directed.  Other specified hypothyroidism Patient is taking Synthroid 125 mcg.  Denies side effects.  Continue to follow-up with PCP.  Continue medications as directed.  Morbid obesity ([place])  BMI 35.0-35.9,adult    Obesity Treatment / Action Plan:  Patient will work on garnering support from family and friends to begin weight loss journey. Will work on eliminating or reducing the presence of highly palatable,  calorie dense foods in the home. Will complete provided nutritional and psychosocial assessment questionnaire before the next appointment. Will be scheduled for indirect calorimetry to determine resting energy expenditure in a fasting state.  This will allow [place] to create a reduced calorie, high-protein meal plan to promote loss of fat mass while preserving muscle mass.  Obesity Education Performed Today:  She was weighed on the bioimpedance scale and results were discussed and documented in the synopsis.  We discussed obesity as a disease and the importance of a more detailed evaluation of all the factors contributing to the disease.  We discussed the importance of  long term lifestyle changes which include nutrition, exercise and behavioral modifications as well as the importance of customizing this to her specific health and social needs.  We discussed the benefits of reaching a healthier weight to alleviate the symptoms of existing conditions and reduce the risks of the biomechanical, metabolic and psychological effects of obesity.  Catherine Coleman appears to be in the action stage of change and states they are ready to start intensive lifestyle modifications and behavioral modifications.  30 minutes was spent today on this visit including the above counseling, pre-visit chart review, and post-visit documentation.  Reviewed by clinician on day of visit: allergies, medications, problem list, medical history, surgical history, family history, social history, and previous encounter notes.    I have reviewed the above documentation for accuracy and completeness, and I agree with the above. Everardo Pacific, FNP

## 2022-10-21 ENCOUNTER — Ambulatory Visit (INDEPENDENT_AMBULATORY_CARE_PROVIDER_SITE_OTHER): Payer: Medicare Other

## 2022-10-21 DIAGNOSIS — Z0000 Encounter for general adult medical examination without abnormal findings: Secondary | ICD-10-CM | POA: Diagnosis not present

## 2022-10-21 NOTE — Patient Instructions (Signed)
Catherine Coleman , Thank you for taking time to come for your Medicare Wellness Visit. I appreciate your ongoing commitment to your health goals. Please review the following plan we discussed and let me know if I can assist you in the future.   These are the goals we discussed:  Goals       Patient Stated (pt-stated)      Eat healthier and continue with 3 days/week of exercise.      Patient Stated      Lose some weight and lower A1c and blood pressure         This is a list of the screening recommended for you and due dates:  Health Maintenance  Topic Date Due   DTaP/Tdap/Td vaccine (1 - Tdap) 10/18/2013   COVID-19 Vaccine (8 - 2023-24 season) 08/19/2022   Medicare Annual Wellness Visit  10/22/2023   Pneumonia Vaccine  Completed   Flu Shot  Completed   DEXA scan (bone density measurement)  Completed   Zoster (Shingles) Vaccine  Completed   HPV Vaccine  Aged Out    Advanced directives: copies in chart  Conditions/risks identified: Lose some weight and lower A1c and blood pressure   Next appointment: Follow up in one year for your annual wellness visit    Preventive Care 65 Years and Older, Female Preventive care refers to lifestyle choices and visits with your health care provider that can promote health and wellness. What does preventive care include? A yearly physical exam. This is also called an annual well check. Dental exams once or twice a year. Routine eye exams. Ask your health care provider how often you should have your eyes checked. Personal lifestyle choices, including: Daily care of your teeth and gums. Regular physical activity. Eating a healthy diet. Avoiding tobacco and drug use. Limiting alcohol use. Practicing safe sex. Taking low-dose aspirin every day. Taking vitamin and mineral supplements as recommended by your health care provider. What happens during an annual well check? The services and screenings done by your health care provider during your  annual well check will depend on your age, overall health, lifestyle risk factors, and family history of disease. Counseling  Your health care provider may ask you questions about your: Alcohol use. Tobacco use. Drug use. Emotional well-being. Home and relationship well-being. Sexual activity. Eating habits. History of falls. Memory and ability to understand (cognition). Work and work Statistician. Reproductive health. Screening  You may have the following tests or measurements: Height, weight, and BMI. Blood pressure. Lipid and cholesterol levels. These may be checked every 5 years, or more frequently if you are over 37 years old. Skin check. Lung cancer screening. You may have this screening every year starting at age 10 if you have a 30-pack-year history of smoking and currently smoke or have quit within the past 15 years. Fecal occult blood test (FOBT) of the stool. You may have this test every year starting at age 75. Flexible sigmoidoscopy or colonoscopy. You may have a sigmoidoscopy every 5 years or a colonoscopy every 10 years starting at age 55. Hepatitis C blood test. Hepatitis B blood test. Sexually transmitted disease (STD) testing. Diabetes screening. This is done by checking your blood sugar (glucose) after you have not eaten for a while (fasting). You may have this done every 1-3 years. Bone density scan. This is done to screen for osteoporosis. You may have this done starting at age 25. Mammogram. This may be done every 1-2 years. Talk to your health  care provider about how often you should have regular mammograms. Talk with your health care provider about your test results, treatment options, and if necessary, the need for more tests. Vaccines  Your health care provider may recommend certain vaccines, such as: Influenza vaccine. This is recommended every year. Tetanus, diphtheria, and acellular pertussis (Tdap, Td) vaccine. You may need a Td booster every 10  years. Zoster vaccine. You may need this after age 51. Pneumococcal 13-valent conjugate (PCV13) vaccine. One dose is recommended after age 67. Pneumococcal polysaccharide (PPSV23) vaccine. One dose is recommended after age 56. Talk to your health care provider about which screenings and vaccines you need and how often you need them. This information is not intended to replace advice given to you by your health care provider. Make sure you discuss any questions you have with your health care provider. Document Released: 09/27/2015 Document Revised: 05/20/2016 Document Reviewed: 07/02/2015 Elsevier Interactive Patient Education  2017 [place] Prevention in the Home Falls can cause injuries. They can happen to people of all ages. There are many things you can do to make your home safe and to help prevent falls. What can I do on the outside of my home? Regularly fix the edges of walkways and driveways and fix any cracks. Remove anything that might make you trip as you walk through a door, such as a raised step or threshold. Trim any bushes or trees on the path to your home. Use bright outdoor lighting. Clear any walking paths of anything that might make someone trip, such as rocks or tools. Regularly check to see if handrails are loose or broken. Make sure that both sides of any steps have handrails. Any raised decks and porches should have guardrails on the edges. Have any leaves, snow, or ice cleared regularly. Use sand or salt on walking paths during winter. Clean up any spills in your garage right away. This includes oil or grease spills. What can I do in the bathroom? Use night lights. Install grab bars by the toilet and in the tub and shower. Do not use towel bars as grab bars. Use non-skid mats or decals in the tub or shower. If you need to sit down in the shower, use a plastic, non-slip stool. Keep the floor dry. Clean up any water that spills on the floor as soon as it  happens. Remove soap buildup in the tub or shower regularly. Attach bath mats securely with double-sided non-slip rug tape. Do not have throw rugs and other things on the floor that can make you trip. What can I do in the bedroom? Use night lights. Make sure that you have a light by your bed that is easy to reach. Do not use any sheets or blankets that are too big for your bed. They should not hang down onto the floor. Have a firm chair that has side arms. You can use this for support while you get dressed. Do not have throw rugs and other things on the floor that can make you trip. What can I do in the kitchen? Clean up any spills right away. Avoid walking on wet floors. Keep items that you use a lot in easy-to-reach places. If you need to reach something above you, use a strong step stool that has a grab bar. Keep electrical cords out of the way. Do not use floor polish or wax that makes floors slippery. If you must use wax, use non-skid floor wax. Do not have throw  rugs and other things on the floor that can make you trip. What can I do with my stairs? Do not leave any items on the stairs. Make sure that there are handrails on both sides of the stairs and use them. Fix handrails that are broken or loose. Make sure that handrails are as long as the stairways. Check any carpeting to make sure that it is firmly attached to the stairs. Fix any carpet that is loose or worn. Avoid having throw rugs at the top or bottom of the stairs. If you do have throw rugs, attach them to the floor with carpet tape. Make sure that you have a light switch at the top of the stairs and the bottom of the stairs. If you do not have them, ask someone to add them for you. What else can I do to help prevent falls? Wear shoes that: Do not have high heels. Have rubber bottoms. Are comfortable and fit you well. Are closed at the toe. Do not wear sandals. If you use a stepladder: Make sure that it is fully opened.  Do not climb a closed stepladder. Make sure that both sides of the stepladder are locked into place. Ask someone to hold it for you, if possible. Clearly mark and make sure that you can see: Any grab bars or handrails. First and last steps. Where the edge of each step is. Use tools that help you move around (mobility aids) if they are needed. These include: Canes. Walkers. Scooters. Crutches. Turn on the lights when you go into a dark area. Replace any light bulbs as soon as they burn out. Set up your furniture so you have a clear path. Avoid moving your furniture around. If any of your floors are uneven, fix them. If there are any pets around you, be aware of where they are. Review your medicines with your doctor. Some medicines can make you feel dizzy. This can increase your chance of falling. Ask your doctor what other things that you can do to help prevent falls. This information is not intended to replace advice given to you by your health care provider. Make sure you discuss any questions you have with your health care provider. Document Released: 06/27/2009 Document Revised: 02/06/2016 Document Reviewed: 10/05/2014 Elsevier Interactive Patient Education  2017 [employer].

## 2022-10-21 NOTE — Progress Notes (Signed)
I connected with  [name] on 10/21/22 by a audio enabled telemedicine application and verified that I am speaking with the correct person using two identifiers.  Patient Location: Home  Provider Location: Home Office  I discussed the limitations of evaluation and management by telemedicine. The patient expressed understanding and agreed to proceed.   Subjective:   Catherine Coleman is a 89 y.o. female who presents for Medicare Annual (Subsequent) preventive examination.  Review of Systems     Cardiac Risk Factors include: advanced age (>12mn, >>51women);obesity (BMI >30kg/m2);hypertension;dyslipidemia     Objective:    There were no vitals filed for this visit. There is no height or weight on file to calculate BMI.     10/21/2022   11:30 AM 03/02/2022   11:07 AM 02/20/2021    8:10 AM 01/21/2021    7:49 AM 01/14/2021    1:20 PM 11/11/2018    9:09 AM 06/20/2018    2:47 PM  Advanced Directives  Does Patient Have a Medical Advance Directive? Yes No Yes Yes Yes Yes Yes  Type of AParamedicof [place]Living will  Living will HV[place]yardLiving will [place]Living will [place]Living will [place] LakeLiving will  Does patient want to make changes to medical advance directive? No - Patient declined   No - Patient declined No - Patient declined No - Patient declined No - Patient declined  Copy of HS[place]in Chart? Yes - validated most recent copy scanned in chart (See row information)  No - copy requested No - copy requested  Yes - validated most recent copy scanned in chart (See row information) No - copy requested  Would patient like information on creating a medical advance directive?   No - Patient declined        Current Medications (verified) Outpatient Encounter Medications as of 10/21/2022  Medication Sig   anastrozole (ARIMIDEX) 1 MG tablet Take 1 tablet (1 mg  total) by mouth daily. Start 04/28/2021   Biotin 10000 MCG TABS Take 1 tablet by mouth daily.   brimonidine (ALPHAGAN) 0.2 % ophthalmic solution Place 1 drop into both eyes 2 (two) times daily.   famotidine (PEPCID) 40 MG tablet TAKE 1 TABLET BY MOUTH AT  BEDTIME AS NEEDED   levothyroxine (SYNTHROID) 125 MCG tablet TAKE 1 TABLET BY MOUTH DAILY  BEFORE BREAKFAST   losartan (COZAAR) 50 MG tablet TAKE 1 TABLET BY MOUTH DAILY   MAGNESIUM-POTASSIUM PO Take 1 tablet by mouth daily.   Multiple Vitamins-Minerals (MULTIVITAMIN WITH MINERALS) tablet Take 1 tablet by mouth daily.   Multiple Vitamins-Minerals (PRESERVISION AREDS PO) Take 1 tablet by mouth daily.   MYRBETRIQ 50 MG TB24 tablet TAKE 1 TABLET BY MOUTH DAILY   omeprazole (PRILOSEC) 20 MG capsule TAKE 1 CAPSULE BY MOUTH DAILY   polyethylene glycol (MIRALAX / GLYCOLAX) packet Take 17 g by mouth daily. (Patient taking differently: Take 17 g by mouth daily as needed.)   rosuvastatin (CRESTOR) 5 MG tablet Take 1 tablet (5 mg total) by mouth daily.   Vitamin D, Cholecalciferol, 25 MCG (1000 UT) CAPS Take 1 capsule by mouth daily.   Calcium Carb-Cholecalciferol 600-10 MG-MCG TABS Take 1 tablet by mouth daily. (Patient not taking: Reported on 10/21/2022)   cephALEXin (KEFLEX) 500 MG capsule Take 1 capsule (500 mg total) by mouth 4 (four) times daily. (Patient not taking: Reported on 10/21/2022)   COVID-19 mRNA bivalent vaccine, Moderna, (MPine Knoll ShoresCOVID-19  BIVAL BOOSTER) 50 MCG/0.5ML injection Inject into the muscle.   COVID-19 mRNA vaccine 2023-2024 (COMIRNATY) SUSP injection Inject into the muscle.   RSV vaccine recomb adjuvanted (AREXVY) 120 MCG/0.5ML injection Inject into the muscle.   [DISCONTINUED] levocetirizine (XYZAL) 5 MG tablet Take 1 tablet (5 mg total) by mouth every evening.   No facility-administered encounter medications on file as of 10/21/2022.    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   Constipation    Decreased hearing    Diverticulitis    Dry eyes    Fatigue    Floaters in visual field    Gall bladder disease    GERD (gastroesophageal reflux disease)    Glaucoma    Hiatal hernia with gastroesophageal reflux    History of blood transfusion    for Knee replacement   Hyperlipidemia, mild 04/02/2016   Hypertension    Hypothyroidism    Joint pain    Migraine aura without headache    Muscle pain    Muscle stiffness    Nasal congestion    Obesity 04/02/2016   Osteoarthritis    Parotiditis 04/21/2016   Vitamin D deficiency 04/02/2016   Past Surgical History:  Procedure Laterality Date   APPENDECTOMY     BREAST LUMPECTOMY WITH RADIOACTIVE SEED LOCALIZATION Left 01/21/2021   Procedure: LEFT BREAST LUMPECTOMY WITH RADIOACTIVE SEED LOCALIZATION;  Surgeon: [name], MD;  Location: [place];  Service: General;  Laterality: Left;   cataract surgery  2008   CHOLECYSTECTOMY  2010   JOINT REPLACEMENT Left    2012   MASTOIDECTOMY     POSTERIOR CERVICAL FUSION/FORAMINOTOMY N/A 04/27/2017   Procedure: CERVICAL FIVE-SIX  OPEN REDUCTION OF FRACTURE, CERVICAL THREE-SEVEN  DORSAL FIXATION AND FUSION;  Surgeon: Ditty, Kevan Ny, MD;  Location: [place];  Service: Neurosurgery;  Laterality: N/A;   SHOULDER ARTHROSCOPY Bilateral prior to 2010   [place] Right 03/22/2015   Procedure: RIGHT TOTAL HIP ARTHROPLASTY ANTERIOR APPROACH;  Surgeon: Dorna Leitz, MD;  Location: [place];  Service: Orthopedics;  Laterality: Right;   TOTAL KNEE ARTHROPLASTY Bilateral 2007   TUBAL LIGATION     Family History  Problem Relation Age of Onset   Dementia Mother    Hypertension Mother    Obesity Mother    Heart disease Father    Hypertension Father    Stroke Father    Diabetes Father    Hyperlipidemia Father    Alcoholism Father     Obesity Father    Arthritis Daughter    Cancer Maternal Grandfather        colon cancer   Social History   Socioeconomic History   Marital status: Widowed    Spouse name: Not on file   Number of children: Not on file   Years of education: Not on file   Highest education level: Not on file  Occupational History   Occupation: Retired  Tobacco Use   Smoking status: Never   Smokeless tobacco: Never   Tobacco comments:    Stopped 50 years ago.   Vaping Use   Vaping Use: Never used  Substance and Sexual Activity   Alcohol use: Yes    Alcohol/week: 0.0 standard drinks of alcohol    Comment: socially 3-4x/wk   Drug use: No   Sexual activity:  Not on file  Other Topics Concern   Not on file  Social History Narrative   [facility], widowed 7 years, no dietary restrictions. Retired from neuropsychiatry work.    Social Determinants of Health   Financial Resource Strain: Low Risk  (10/21/2022)   Overall Financial Resource Strain (CARDIA)    Difficulty of Paying Living Expenses: Not hard at all  Food Insecurity: No Food Insecurity (10/21/2022)   Hunger Vital Sign    Worried About Running Out of Food in the Last Year: Never true    Ran Out of Food in the Last Year: Never true  Transportation Needs: No Transportation Needs (10/21/2022)   PRAPARE - Hydrologist (Medical): No    Lack of Transportation (Non-Medical): No  Physical Activity: Sufficiently Active (10/21/2022)   Exercise Vital Sign    Days of Exercise per Week: 3 days    Minutes of Exercise per Session: 60 min  Stress: No Stress Concern Present (10/21/2022)   Bend    Feeling of Stress : Not at all  Social Connections: Moderately Isolated (10/21/2022)   Social Connection and Isolation Panel [NHANES]    Frequency of Communication with Friends and Family: More than three times a week    Frequency of Social Gatherings with  Friends and Family: More than three times a week    Attends Religious Services: Never    Marine scientist or Organizations: Yes    Attends Archivist Meetings: 1 to 4 times per year    Marital Status: Widowed    Tobacco Counseling Counseling given: Not Answered Tobacco comments: Stopped 50 years ago.    Clinical Intake:  Pre-visit preparation completed: Yes  Pain : No/denies pain     BMI - recorded: 35.28 Nutritional Status: BMI > 30  Obese Nutritional Risks: None Diabetes: No  How often do you need to have someone help you when you read instructions, pamphlets, or other written materials from your doctor or pharmacy?: 1 - Never  Diabetic?no  Interpreter Needed?: No  Information entered by :: Charlott Rakes, LPN   Activities of Daily Living    10/21/2022   11:31 AM  In your present state of health, do you have any difficulty performing the following activities:  Hearing? 1  Comment wears hearing aids  Vision? 0  Difficulty concentrating or making decisions? 0  Walking or climbing stairs? 0  Dressing or bathing? 0  Doing errands, shopping? 0  Preparing Food and eating ? N  Using the Toilet? N  In the past six months, have you accidently leaked urine? N  Do you have problems with loss of bowel control? N  Managing your Medications? N  Managing your Finances? N  Housekeeping or managing your Housekeeping? N    Patient Care Team: [name], MD as PCP - General (Family Medicine) Croitoru, Dani Gobble, MD as PCP - Cardiology (Cardiology) Mauro Kaufmann, RN as Oncology Nurse Navigator Rockwell Germany, RN as Oncology Nurse Navigator Magrinat, Virgie Dad, MD (Inactive) as Consulting Physician (Oncology) [name], MD as Consulting Physician (General Surgery) Kyung Rudd, MD as Consulting Physician (Radiation Oncology) Damian Leavell, MD as Consulting Physician (Obstetrics and Gynecology) Zonia Kief, MD as Consulting Physician  (Rehabilitation) Ulla Gallo, MD as Consulting Physician (Dermatology) Cherre Robins, [place] (Pharmacist)  Indicate any recent Medical Services you may have received from other than [facility] providers in the past year (  date may be approximate).     Assessment:   This is a routine wellness examination for [place].  Hearing/Vision screen Hearing Screening - Comments:: Pt wears hearing aids  Vision Screening - Comments:: Pt follows up with [facility] for annual eye exams   Dietary issues and exercise activities discussed: Current Exercise Habits: Home exercise routine, Type of exercise: Other - see comments (water aerobics), Time (Minutes): 60, Frequency (Times/Week): 3, Weekly Exercise (Minutes/Week): 180   Goals Addressed             This Visit's Progress    Patient Stated       Lose some weight and lower A1c and blood pressure        Depression Screen    10/21/2022   11:28 AM 06/04/2022    3:49 PM 02/05/2022    9:09 AM 07/22/2021    3:49 PM 07/16/2020   10:11 AM 11/11/2018    9:13 AM 05/30/2018    8:30 AM  PHQ 2/9 Scores  PHQ - 2 Score 0 0 0 0 0 0 4  PHQ- 9 Score  0 0 0   6    Fall Risk    10/21/2022   11:31 AM 06/04/2022    3:46 PM 02/05/2022    9:08 AM 07/22/2021    3:44 PM 07/16/2020   10:10 AM  Fall Risk   Falls in the past year? 1 1 1 $ 0 1  Number falls in past yr: 1 0 0 0 0  Injury with Fall? 1 1 1 $ 0 0  Comment broke 4 ribs      Risk for fall due to : History of fall(s)  History of fall(s) No Fall Risks   Follow up Falls prevention discussed Falls evaluation completed Falls evaluation completed Falls evaluation completed     [place]:  Any stairs in or around the home? No  If so, are there any without handrails? No  Home free of loose throw rugs in walkways, pet beds, electrical cords, etc? Yes  Adequate lighting in your home to reduce risk of falls? Yes   ASSISTIVE DEVICES UTILIZED TO PREVENT FALLS:  Life alert? Yes   Use of a cane, walker or w/c? Yes  Grab bars in the bathroom? Yes  Shower chair or bench in shower? Yes  Elevated toilet seat or a handicapped toilet? Yes   TIMED UP AND GO:  Was the test performed? No .   Cognitive Function:    10/18/2017   11:18 AM  MMSE - Mini Mental State Exam  Orientation to time 5  Orientation to Place 5  Registration 3  Attention/ Calculation 5  Recall 3  Language- name 2 objects 2  Language- repeat 1  Language- follow 3 step command 3  Language- read & follow direction 1  Write a sentence 1  Copy design 1  Total score 30        10/21/2022   11:33 AM  6CIT Screen  What Year? 0 points  What month? 0 points  What time? 0 points  Count back from 20 0 points  Months in reverse 0 points  Repeat phrase 0 points  Total Score 0 points    Immunizations Immunization History  Administered Date(s) Administered   COVID-19, mRNA, vaccine(Comirnaty)12 years and older 06/24/2022   Fluad Quad(high Dose 65+) 05/30/2019, 06/07/2020, 07/01/2021, 06/04/2022   Hepatitis A, Adult 02/17/2018   Influenza, High Dose Seasonal PF 06/14/2017, 05/27/2018   Influenza-Unspecified  06/12/2014, 06/24/2015   Moderna Covid-19 Vaccine Bivalent Booster 77yr & up 06/09/2021, 06/26/2021, 02/06/2022   Moderna Sars-Covid-2 Vaccination 09/26/2019, 10/24/2019, 07/11/2020   PFIZER Comirnaty(Gray Top)Covid-19 Tri-Sucrose Vaccine 01/06/2021   Pneumococcal Conjugate-13 10/17/2013, 04/30/2015   Pneumococcal Polysaccharide-23 09/14/1998, 09/14/2008, 01/28/2018   Respiratory Syncytial Virus Vaccine,Recomb Aduvanted(Arexvy) 06/24/2022   Tetanus 10/17/2013   Zoster Recombinat (Shingrix) 01/26/2017, 06/28/2017    TDAP status: Up to date  Flu Vaccine status: Up to date  Pneumococcal vaccine status: Up to date  Covid-19 vaccine status: Completed vaccines  Qualifies for Shingles Vaccine? Yes   Zostavax completed Yes   Shingrix Completed?: Yes  Screening Tests Health Maintenance   Topic Date Due   DTaP/Tdap/Td (1 - Tdap) 10/18/2013   COVID-19 Vaccine (8 - 2023-24 season) 08/19/2022   Medicare Annual Wellness (AWV)  10/22/2023   Pneumonia Vaccine 89 Years old  Completed   INFLUENZA VACCINE  Completed   DEXA SCAN  Completed   Zoster Vaccines- Shingrix  Completed   HPV VACCINES  Aged Out    Health Maintenance  Health Maintenance Due  Topic Date Due   DTaP/Tdap/Td (1 - Tdap) 10/18/2013   COVID-19 Vaccine (8 - 2023-24 season) 08/19/2022    Colorectal cancer screening: No longer required.   Mammogram status: Completed scheduled 11/16/19. Repeat every year  Bone Density status: Completed 07/17/19. Results reflect: Bone density results: OSTEOPENIA. Repeat every pt stated scheduled next month  years.  Additional Screening:   Vision Screening: Recommended annual ophthalmology exams for early detection of glaucoma and other disorders of the eye. Is the patient up to date with their annual eye exam?  Yes  Who is the provider or what is the name of the office in which the patient attends annual eye exams? [facility] If pt is not established with a provider, would they like to be referred to a provider to establish care? No .   Dental Screening: Recommended annual dental exams for proper oral hygiene  Community Resource Referral / Chronic Care Management: CRR required this visit?  No   CCM required this visit?  No      Plan:     I have personally reviewed and noted the following in the patient's chart:   Medical and social history Use of alcohol, tobacco or illicit drugs  Current medications and supplements including opioid prescriptions. Patient is not currently taking opioid prescriptions. Functional ability and status Nutritional status Physical activity Advanced directives List of other physicians Hospitalizations, surgeries, and ER visits in previous 12 months Vitals Screenings to include cognitive, depression, and falls Referrals and  appointments  In addition, I have reviewed and discussed with patient certain preventive protocols, quality metrics, and best practice recommendations. A written personalized care plan for preventive services as well as general preventive health recommendations were provided to patient.     TWillette Brace LPN   [id number]  Nurse Notes: none

## 2022-10-23 NOTE — Progress Notes (Signed)
I have reviewed and agree with [facility] documentation.  Kathlene November, MD

## 2022-10-26 ENCOUNTER — Encounter: Payer: Self-pay | Admitting: Bariatrics

## 2022-10-26 ENCOUNTER — Ambulatory Visit (INDEPENDENT_AMBULATORY_CARE_PROVIDER_SITE_OTHER): Payer: Medicare Other | Admitting: Bariatrics

## 2022-10-26 VITALS — BP 166/89 | HR 74 | Temp 97.7°F | Ht 65.0 in | Wt 211.0 lb

## 2022-10-26 DIAGNOSIS — E538 Deficiency of other specified B group vitamins: Secondary | ICD-10-CM

## 2022-10-26 DIAGNOSIS — R0602 Shortness of breath: Secondary | ICD-10-CM

## 2022-10-26 DIAGNOSIS — Z1331 Encounter for screening for depression: Secondary | ICD-10-CM | POA: Diagnosis not present

## 2022-10-26 DIAGNOSIS — E785 Hyperlipidemia, unspecified: Secondary | ICD-10-CM

## 2022-10-26 DIAGNOSIS — E1169 Type 2 diabetes mellitus with other specified complication: Secondary | ICD-10-CM | POA: Diagnosis not present

## 2022-10-26 DIAGNOSIS — E038 Other specified hypothyroidism: Secondary | ICD-10-CM

## 2022-10-26 DIAGNOSIS — I10 Essential (primary) hypertension: Secondary | ICD-10-CM

## 2022-10-26 DIAGNOSIS — R5383 Other fatigue: Secondary | ICD-10-CM | POA: Diagnosis not present

## 2022-10-26 DIAGNOSIS — E669 Obesity, unspecified: Secondary | ICD-10-CM

## 2022-10-26 DIAGNOSIS — Z6834 Body mass index (BMI) 34.0-34.9, adult: Secondary | ICD-10-CM | POA: Insufficient documentation

## 2022-10-26 DIAGNOSIS — E559 Vitamin D deficiency, unspecified: Secondary | ICD-10-CM

## 2022-10-26 DIAGNOSIS — Z6835 Body mass index (BMI) 35.0-35.9, adult: Secondary | ICD-10-CM

## 2022-10-26 MED ORDER — METFORMIN HCL 500 MG PO TABS: 500.0000 mg | ORAL_TABLET | Freq: Every day | ORAL | 0 refills | Status: DC

## 2022-10-28 ENCOUNTER — Telehealth: Payer: Self-pay

## 2022-10-28 LAB — INSULIN, RANDOM: INSULIN: 7.9 u[IU]/mL (ref 2.6–24.9)

## 2022-10-28 LAB — VITAMIN B12: Vitamin B-12: 327 pg/mL (ref 232–1245)

## 2022-10-28 NOTE — Telephone Encounter (Signed)
-----   Message from Georgia Lopes, DO sent at 10/28/2022  9:12 AM EST ----- Regarding: Labs Call pt.   The labs ( B12 and insulin ) is normal.  ----- Message ----- From: Interface, [facility] Lab Results In Sent: 10/28/2022   5:38 AM EST To: Georgia Lopes, DO

## 2022-10-28 NOTE — Telephone Encounter (Signed)
Notified patient of lab results per Dr. Owens Shark. Patient verbalized understanding.

## 2022-11-03 ENCOUNTER — Encounter: Payer: Self-pay | Admitting: Obstetrics and Gynecology

## 2022-11-03 ENCOUNTER — Other Ambulatory Visit (HOSPITAL_COMMUNITY)
Admission: RE | Admit: 2022-11-03 | Discharge: 2022-11-03 | Disposition: A | Discharge status: Home / Self Care | Payer: Medicare Other | Attending: Obstetrics and Gynecology | Admitting: Obstetrics and Gynecology

## 2022-11-03 ENCOUNTER — Ambulatory Visit: Payer: Medicare Other | Admitting: Obstetrics and Gynecology

## 2022-11-03 VITALS — BP 125/74 | HR 70 | Ht 63.5 in | Wt 203.0 lb

## 2022-11-03 DIAGNOSIS — R350 Frequency of micturition: Secondary | ICD-10-CM | POA: Insufficient documentation

## 2022-11-03 DIAGNOSIS — R82998 Other abnormal findings in urine: Secondary | ICD-10-CM | POA: Diagnosis not present

## 2022-11-03 DIAGNOSIS — N3281 Overactive bladder: Secondary | ICD-10-CM

## 2022-11-03 LAB — POCT URINALYSIS DIPSTICK
Bilirubin, UA: NEGATIVE
Blood, UA: NEGATIVE
Glucose, UA: NEGATIVE
Ketones, UA: NEGATIVE
Nitrite, UA: NEGATIVE
Protein, UA: NEGATIVE
Spec Grav, UA: 1.025 (ref 1.010–1.025)
Urobilinogen, UA: 1 E.U./dL
pH, UA: 6 (ref 5.0–8.0)

## 2022-11-03 NOTE — Progress Notes (Signed)
[facility] New Patient Evaluation and Consultation  Referring Provider: [name], MD PCP: [name], MD Date of Service: 11/03/2022  SUBJECTIVE Chief Complaint: New Patient (Initial Visit) Catherine Coleman is a 89 y.o. female here for a consult for incontinence./)  History of Present Illness: Glenis Gollihue is a 89 y.o. White or Caucasian female seen in consultation at the request of Dr. [name] for evaluation of Urinary incontinence.    Review of records significant for: Has constant dribbling of urine.   Seen by Marjean Donna 2022- 2023, started on Myrbetriq. Positive cough stress test seen at initial visit.   Urinary Symptoms: Leaks urine with while asleep Leaks 2 time(s) per days.  Pad use: 2 liners/ mini-pads per day.   She is bothered by her UI symptoms. Reports she has been on Myrbetriq 16m daily  for 2-3 years which has helped her daytime leaking but has not helped her nighttime leaking Has done pelvic PT x2 and felt it was not very beneficial  Day time voids 4-5.  Nocturia: 1-2 times per night to void. Voiding dysfunction: she does not empty her bladder well.  does not use a catheter to empty bladder.  When urinating, she feels difficulty starting urine stream, dribbling after finishing, the need to urinate multiple times in a row, and to push on her belly or vagina to empty bladder Drinks: 20oz coffee in the morning and 10oz of decaf at night, 4 glasses of water per day, 1 glass of tea daily and 1 cup of crystal light daily  UTIs: 0 UTI's in the last year.   Denies history of blood in urine, kidney or bladder stones, pyelonephritis, bladder cancer, and kidney cancer  Pelvic Organ Prolapse Symptoms:                  She Denies a feeling of a bulge the vaginal area.   Bowel Symptom: Bowel movements: 1 time(s) per day Stool consistency: solid or soft  Straining: no.  Splinting: no.  Incomplete evacuation: yes.  She Denies accidental bowel  leakage / fecal incontinence Bowel regimen: miralax Last colonoscopy: Date 8-9 years ago, Results normal  Sexual Function Sexually active: no.  Sexual orientation: Straight Pain with sex: No  Pelvic Pain Denies pelvic pain   Past Medical History:  Past Medical History:  Diagnosis Date   Arthritis    Atrophic rhinitis 04/21/2016   Back pain    Breast cancer ([place] 12/2020   left breast IDC   Constipation    Decreased hearing    Diverticulitis    Dry eyes    Fatigue    Floaters in visual field    Gall bladder disease    GERD (gastroesophageal reflux disease)    Glaucoma    Hiatal hernia with gastroesophageal reflux    History of blood transfusion    for Knee replacement   Hyperlipidemia, mild 04/02/2016   Hypertension    Hypothyroidism    Joint pain    Migraine aura without headache    Muscle pain    Muscle stiffness    Nasal congestion    Obesity 04/02/2016   Osteoarthritis    Parotiditis 04/21/2016   Pre-diabetes    Vitamin D deficiency 04/02/2016     Past Surgical History:   Past Surgical History:  Procedure Laterality Date   APPENDECTOMY     BREAST LUMPECTOMY WITH RADIOACTIVE SEED LOCALIZATION Left 01/21/2021   Procedure: LEFT BREAST LUMPECTOMY WITH RADIOACTIVE SEED LOCALIZATION;  Surgeon: BNinfa Linden  Nathaneil Canary, MD;  Location: [facility];  Service: General;  Laterality: Left;   cataract surgery  2008   CHOLECYSTECTOMY  2010   JOINT REPLACEMENT Left    2012   MASTOIDECTOMY     POSTERIOR CERVICAL FUSION/FORAMINOTOMY N/A 04/27/2017   Procedure: CERVICAL FIVE-SIX  OPEN REDUCTION OF FRACTURE, CERVICAL THREE-SEVEN  DORSAL FIXATION AND FUSION;  Surgeon: Ditty, Kevan Ny, MD;  Location: [facility];  Service: Neurosurgery;  Laterality: N/A;   SHOULDER ARTHROSCOPY Bilateral prior to 2010   [place] Right 03/22/2015   Procedure: RIGHT TOTAL HIP ARTHROPLASTY ANTERIOR APPROACH;  Surgeon: Dorna Leitz, MD;  Location: [facility];   Service: Orthopedics;  Laterality: Right;   TOTAL KNEE ARTHROPLASTY Bilateral 2007   TUBAL LIGATION       Past OB/GYN History: G4 P4 Vaginal deliveries: 4,  Forceps/ Vacuum deliveries: One birth used forceps, Cesarean section: 0 Menopausal: Yes Last pap smear was unknown.  Any history of abnormal pap smears: no.   Medications: She has a current medication list which includes the following prescription(s): anastrozole, biotin, brimonidine, calcium carb-cholecalciferol, cephalexin, moderna covid-19 bival booster, covid-19 mrna vaccine 2023-2024, famotidine, levothyroxine, losartan, magnesium-potassium, metformin, multivitamin with minerals, multiple vitamins-minerals, myrbetriq, omeprazole, polyethylene glycol, rosuvastatin, arexvy, and vitamin d (cholecalciferol).   Allergies: Patient is allergic to levonorgestrel-ethinyl estrad.   Social History:  Social History   Tobacco Use   Smoking status: Never   Smokeless tobacco: Never   Tobacco comments:    Stopped 50 years ago.   Vaping Use   Vaping Use: Never used  Substance Use Topics   Alcohol use: Yes    Alcohol/week: 0.0 standard drinks of alcohol    Comment: socially 3-4x/wk   Drug use: No    Relationship status: widowed She lives at [facility].   She is not employed . Regular exercise: Yes:   History of abuse: No  Family History:   Family History  Problem Relation Age of Onset   Dementia Mother    Hypertension Mother    Obesity Mother    Diabetes Mother    Heart disease Father    Hypertension Father    Stroke Father    Diabetes Father    Hyperlipidemia Father    Alcoholism Father    Obesity Father    Cancer Maternal Grandfather        colon cancer   Arthritis Daughter      Review of Systems: Review of Systems  Constitutional:  Negative for chills, fever and weight loss.  Cardiovascular:  Negative for chest pain, palpitations and leg swelling.  Gastrointestinal:  Negative for abdominal pain, nausea and  vomiting.  Genitourinary:  Positive for frequency and urgency.  Neurological:  Negative for dizziness and headaches.  Psychiatric/Behavioral:  Negative for depression and suicidal ideas. The patient is not nervous/anxious.      OBJECTIVE Physical Exam: Vitals:   11/03/22 0944  BP: 125/74  Pulse: 70  Weight: 203 lb (92.1 kg)  Height: 5' 3.5" (1.613 m)    Physical Exam Exam conducted with a chaperone present.  Constitutional:      Appearance: Normal appearance.  Genitourinary:    General: Normal vulva.     Urethra: No prolapse.  Neurological:     Mental Status: She is alert.      GU / Detailed Urogynecologic Evaluation:  Pelvic Exam: Normal external female genitalia; Bartholin's and Skene's glands normal in appearance; urethral meatus normal in appearance, no urethral masses  or discharge.   CST: negative   Speculum exam reveals normal vaginal mucosa with atrophy. Cervix normal appearance. Uterus normal single, nontender. Adnexa normal adnexa.     Pelvic floor strength I/V, puborectalis   Pelvic floor musculature: Right levator tender, Right obturator tender, Left levator tender, Left obturator tender  POP-Q:   POP-Q  -2                                            Aa   -2                                           Ba  -5                                              C   3                                            Gh  -6.5                                            Pb  8.5                                            tvl   -1.5                                            Ap  -1.5                                            Bp  -6.5                                              D      Rectal Exam:  Normal external rectum  Post-Void Residual (PVR) by Bladder Scan: In order to evaluate bladder emptying, we discussed obtaining a postvoid residual and she agreed to this procedure.  Procedure: The ultrasound unit was placed on the patient's abdomen in the  suprapubic region after the patient had voided. A PVR of 8 ml was obtained by bladder scan.  Laboratory Results: POC urine: small leukocytes   ASSESSMENT AND PLAN Ms. Kiang is a 89 y.o. with:  1. Overactive bladder   2. Urinary frequency   3. Leukocytes in urine    OAB - We discussed the symptoms of overactive bladder (OAB), which include urinary urgency, urinary frequency, nocturia, with or  without urge incontinence.  While we do not know the exact etiology of OAB, several treatment options exist. We discussed management including behavioral therapy (decreasing bladder irritants, urge suppression strategies, timed voids, bladder retraining), physical therapy, medication; for refractory cases posterior tibial nerve stimulation, sacral neuromodulation, and intravesical botulinum toxin injection.  - She would be a good candidate for PTNS but she prefers not to pursue at this time. Handout provided for her to review more.  - Discussed avoiding drinking 2-3 hours prior to bedtime. She should especially avoid coffee, tea and wine in the afternoon and evening.  - Would not recommend anticholinergics due to glaucoma. Can consider gemtesa but she is happy on the myrbetriq 10m.   2. Leukocytes in urine - will send for culture.   Return as needed  MJaquita Folds MD

## 2022-11-03 NOTE — Patient Instructions (Signed)
Today we talked about ways to manage bladder urgency such as altering your diet to avoid irritative beverages and foods (bladder diet) as well as attempting to decrease stress and other exacerbating factors.  Stop drinking 2-3 hours prior to bedtime. Especially avoid coffee, tea and wine in the afternoon or evening.   The Most Bothersome Foods* The Least Bothersome Foods*  Coffee - Regular & Decaf Tea - caffeinated Carbonated beverages - cola, non-colas, diet & caffeine-free Alcohols - Beer, Red Wine, White Wine, Champagne Fruits - Grapefruit, [place], Orange, [employer] - Cranberry, Grapefruit, Orange, Pineapple Vegetables - Tomato & Tomato Products Flavor Enhancers - Hot peppers, Spicy foods, Chili, Horseradish, Vinegar, Monosodium glutamate (MSG) Artificial Sweeteners - NutraSweet, Sweet 'N Low, Equal (sweetener), Saccharin Ethnic foods - [place], [place], [place] food [employer]s - low-fat & whole Fruits - Bananas, Blueberries, Honeydew melon, Pears, Raisins, Watermelon Vegetables - Broccoli, Brussels Sprouts, [place], Carrots, Cauliflower, Mucarabones, Cucumber, Mushrooms, Peas, Radishes, Squash, Zucchini, White potatoes, Sweet potatoes & yams Poultry - Chicken, Eggs, [place], [employer] - Beef, Programmer, multimedia, Lamb Seafood - Shrimp, [place] fish, Salmon Grains - Oat, Rice Snacks - Pretzels, Popcorn  *Lissa Morales et al. Diet and its role in interstitial cystitis/bladder pain syndrome (IC/BPS) and comorbid conditions. Katie 2012 Jan 11.

## 2022-11-04 ENCOUNTER — Ambulatory Visit: Payer: Medicare Other | Admitting: Orthopaedic Surgery

## 2022-11-04 LAB — URINE CULTURE

## 2022-11-09 ENCOUNTER — Encounter: Payer: Self-pay | Admitting: Bariatrics

## 2022-11-09 ENCOUNTER — Ambulatory Visit (INDEPENDENT_AMBULATORY_CARE_PROVIDER_SITE_OTHER): Payer: Medicare Other | Admitting: Bariatrics

## 2022-11-09 VITALS — BP 147/76 | HR 62 | Temp 97.8°F | Ht 65.0 in | Wt 207.0 lb

## 2022-11-09 DIAGNOSIS — E669 Obesity, unspecified: Secondary | ICD-10-CM | POA: Diagnosis not present

## 2022-11-09 DIAGNOSIS — E1169 Type 2 diabetes mellitus with other specified complication: Secondary | ICD-10-CM

## 2022-11-09 DIAGNOSIS — Z7984 Long term (current) use of oral hypoglycemic drugs: Secondary | ICD-10-CM | POA: Diagnosis not present

## 2022-11-09 DIAGNOSIS — E119 Type 2 diabetes mellitus without complications: Secondary | ICD-10-CM

## 2022-11-09 DIAGNOSIS — I10 Essential (primary) hypertension: Secondary | ICD-10-CM

## 2022-11-09 DIAGNOSIS — E785 Hyperlipidemia, unspecified: Secondary | ICD-10-CM

## 2022-11-09 DIAGNOSIS — Z6834 Body mass index (BMI) 34.0-34.9, adult: Secondary | ICD-10-CM

## 2022-11-09 MED ORDER — OZEMPIC (0.25 OR 0.5 MG/DOSE) 2 MG/3ML ~~LOC~~ SOPN: PEN_INJECTOR | SUBCUTANEOUS | 0 refills | Status: DC

## 2022-11-09 NOTE — Progress Notes (Unsigned)
Chief Complaint:   OBESITY Catherine Coleman (MR# [id number]) is a 89 y.o. female who presents for evaluation and treatment of obesity and related comorbidities. Current BMI is Body mass index is 35.11 kg/m. Catherine Coleman has been struggling with her weight for many years and has been unsuccessful in either losing weight, maintaining weight loss, or reaching her healthy weight goal.  Catherine Coleman was seen for an information session by Catherine Pacific, FNP-C on 10/19/2022. She like to cook.   Catherine Coleman is currently in the action stage of change and ready to dedicate time achieving and maintaining a healthier weight. [name] is interested in becoming our patient and working on intensive lifestyle modifications including (but not limited to) diet and exercise for weight loss.  Catherine Coleman's habits were reviewed today and are as follows: her desired weight loss is 26-31 lbs, she has been heavy most of her life, she started gaining weight while working, her heaviest weight ever was 224 pounds, she has significant food cravings issues, she snacks frequently in the evenings, she skips meals frequently, she is frequently drinking liquids with calories, she frequently eats larger portions than normal, and she struggles with emotional eating.  Depression Screen [name]'s Food and Mood (modified PHQ-9) score was 4.  Subjective:   1. Other fatigue [name] admits to daytime somnolence and admits to waking up still tired. Patient has a history of symptoms of daytime fatigue and morning fatigue. Catherine Coleman generally gets 6 or 8 hours of sleep per night, and states that she has nightime awakenings. Snoring is present. Apneic episodes are not present. Epworth Sleepiness Score is 5.   2. SOB (shortness of breath) on exertion Catherine Coleman notes increasing shortness of breath with exercising and seems to be worsening over time with weight gain. She notes getting out of breath sooner with activity than she used to. This has not  gotten worse recently. Catherine Coleman denies shortness of breath at rest or orthopnea.  3. Essential hypertension Catherine Coleman is taking Cozaar. Her blood pressure is elevated today at 166/89. She did not take her medication today. Usually controlled per the patient.   4. Other specified hypothyroidism Catherine Coleman is taking Synthroid. Usually controlled per the patient.   5. Vitamin D deficiency Catherine Coleman is taking Vitamin D.   6. Hyperlipidemia, mild Catherine Coleman is taking Crestor.   7. Diabetes mellitus type 2 in obese Catherine Coleman) Catherine Coleman is not taking medications. Last A1c was 6.5. She has never been pn medications in the past.   8. Vitamin B 12 deficiency Catherine Coleman is taking multivitamins.   Assessment/Plan:   1. Other fatigue Catherine Coleman does feel that her weight is causing her energy to be lower than it should be. Fatigue may be related to obesity, depression or many other causes. Labs will be ordered, and in the meanwhile, Catherine Coleman will focus on self care including making healthy food choices, increasing physical activity and focusing on stress reduction.  2. SOB (shortness of breath) on exertion Catherine Coleman does feel that she gets out of breath more easily that she used to when she exercises. Catherine Coleman's shortness of breath appears to be obesity related and exercise induced. She has agreed to work on weight loss and gradually increase exercise to treat her exercise induced shortness of breath. Will continue to monitor closely.  3. Essential hypertension Catherine Coleman will continue her medications. She will work on eliminating added salt.   4. Other specified hypothyroidism Catherine Coleman will continue her medications. She will continue to follow-up with her PCP.   5. Vitamin  D deficiency Catherine Coleman will continue her Vitamin D.   6. Hyperlipidemia, mild Catherine Coleman will continue her medications, and she will work on eliminating trans fats.   7. Diabetes mellitus type 2 in obese [facility]) We will check labs today. Handouts on  metformin and GLP-1 were given to the patient today. Catherine Coleman agreed to start metformin 500 mg once daily with no refills.   - metFORMIN (GLUCOPHAGE) 500 MG tablet; Take 1 tablet (500 mg total) by mouth daily with breakfast.  Dispense: 30 tablet; Refill: 0 - Insulin, random  8. Vitamin B 12 deficiency We will check labs today, and we will follow-up at [facility] next visit.   - Vitamin B12  9. Depression screening Catherine Coleman had a negative depression screening.   10. Generalized obesity  11. BMI 35.0-35.9,adult Catherine Coleman is currently in the action stage of change and her goal is to continue with weight loss efforts. I recommend Aleeyah begin the structured treatment plan as follows:  She has agreed to the Category 2 Plan.  Meal planning was discussed. Reviewed labs from 10/12/2022, CMP, lipid, Vit D, CBC, A1c, and TSH.   Exercise goals: No exercise has been prescribed at this time.   Behavioral modification strategies: increasing lean protein intake, decreasing simple carbohydrates, increasing vegetables, increasing water intake, decreasing eating out, no skipping meals, meal planning and cooking strategies, keeping healthy foods in the home, and planning for success.  She was informed of the importance of frequent follow-up visits to maximize her success with intensive lifestyle modifications for her multiple health conditions. She was informed we would discuss her lab results at her next visit unless there is a critical issue that needs to be addressed sooner. Catherine Coleman agreed to keep her next visit at the agreed upon time to discuss these results.  Objective:   Blood pressure (!) 166/89, pulse 74, temperature 97.7 F (36.5 C), height '5\' 5"'$  (1.651 m), weight 211 lb (95.7 kg), SpO2 94 %. Body mass index is 35.11 kg/m.  EKG: Normal sinus rhythm, rate 86 BPM.  Indirect Calorimeter completed today shows a VO2 of 244 and a REE of 1685.  Her calculated basal metabolic rate is [id number] thus her  basal metabolic rate is better than expected.  General: Cooperative, alert, well developed, in no acute distress. HEENT: Conjunctivae and lids unremarkable. Cardiovascular: Regular rhythm.  Lungs: Normal work of breathing. Neurologic: No focal deficits.   Lab Results  Component Value Date   CREATININE 0.77 10/12/2022   BUN 21 10/12/2022   NA 135 10/12/2022   K 5.0 10/12/2022   CL 98 10/12/2022   CO2 28 10/12/2022   Lab Results  Component Value Date   ALT 16 10/12/2022   AST 18 10/12/2022   ALKPHOS 95 10/12/2022   BILITOT 0.4 10/12/2022   Lab Results  Component Value Date   HGBA1C 6.5 10/12/2022   HGBA1C 6.4 06/05/2022   HGBA1C 6.4 02/05/2022   HGBA1C 6.7 (H) 07/22/2021   HGBA1C 6.2 10/09/2020   Lab Results  Component Value Date   INSULIN 7.9 10/26/2022   INSULIN 11.5 06/12/2019   INSULIN 9.8 09/28/2018   INSULIN 15.5 05/30/2018   Lab Results  Component Value Date   TSH 4.17 10/12/2022   Lab Results  Component Value Date   CHOL 216 (H) 10/12/2022   HDL 85.40 10/12/2022   LDLCALC 115 (H) 10/12/2022   LDLDIRECT 138.0 05/15/2019   TRIG 77.0 10/12/2022   CHOLHDL 3 10/12/2022   Lab Results  Component Value Date  WBC 5.1 10/12/2022   HGB 14.1 10/12/2022   HCT 41.0 10/12/2022   MCV 96.3 10/12/2022   PLT 237.0 10/12/2022   No results found for: "IRON", "TIBC", "FERRITIN"  Attestation Statements:   Reviewed by clinician on day of visit: allergies, medications, problem list, medical history, surgical history, family history, social history, and previous encounter notes.   Wilhemena Durie, am acting as Location manager for [employer], DO.  I have reviewed the above documentation for accuracy and completeness, and I agree with the above. - ***

## 2022-11-13 DIAGNOSIS — H401412 Capsular glaucoma with pseudoexfoliation of lens, right eye, moderate stage: Secondary | ICD-10-CM | POA: Diagnosis not present

## 2022-11-13 DIAGNOSIS — H401421 Capsular glaucoma with pseudoexfoliation of lens, left eye, mild stage: Secondary | ICD-10-CM | POA: Diagnosis not present

## 2022-11-13 DIAGNOSIS — H35362 Drusen (degenerative) of macula, left eye: Secondary | ICD-10-CM | POA: Diagnosis not present

## 2022-11-13 DIAGNOSIS — H16223 Keratoconjunctivitis sicca, not specified as Sjogren's, bilateral: Secondary | ICD-10-CM | POA: Diagnosis not present

## 2022-11-23 ENCOUNTER — Ambulatory Visit (INDEPENDENT_AMBULATORY_CARE_PROVIDER_SITE_OTHER): Payer: Medicare Other | Admitting: Nurse Practitioner

## 2022-11-23 VITALS — BP 135/80 | HR 72 | Temp 97.7°F | Ht 65.0 in | Wt 205.0 lb

## 2022-11-23 DIAGNOSIS — Z7985 Long-term (current) use of injectable non-insulin antidiabetic drugs: Secondary | ICD-10-CM

## 2022-11-23 DIAGNOSIS — E1169 Type 2 diabetes mellitus with other specified complication: Secondary | ICD-10-CM

## 2022-11-23 DIAGNOSIS — Z6834 Body mass index (BMI) 34.0-34.9, adult: Secondary | ICD-10-CM | POA: Diagnosis not present

## 2022-11-23 DIAGNOSIS — E669 Obesity, unspecified: Secondary | ICD-10-CM

## 2022-11-23 NOTE — Progress Notes (Signed)
Office: [phone]  /  Fax: [phone]  WEIGHT SUMMARY AND BIOMETRICS  Weight Lost Since Last Visit: 2lb  No data recorded  Vitals Temp: 97.7 F (36.5 C) BP: 135/80 Pulse Rate: 72 SpO2: 93 %   Anthropometric Measurements Height: '5\' 5"'$  (1.651 m) Weight: 205 lb (93 kg) BMI (Calculated): 34.11 Weight at Last Visit: 207lb Weight Lost Since Last Visit: 2lb Starting Weight: 211lb Total Weight Loss (lbs): 6 lb (2.722 kg)   Body Composition  Body Fat %: 48.1 % Fat Mass (lbs): 98.8 lbs Muscle Mass (lbs): 101.1 lbs Total Body Water (lbs): 73.8 lbs Visceral Fat Rating : 17   Other Clinical Data Today's Visit #: 3 Starting Date: 10/26/22     HPI  Chief Complaint: OBESITY  Catherine Coleman is here to discuss her progress with her obesity treatment plan. She is on the the Category 2 Plan and states she is following her eating plan approximately 50 % of the time. She states she is exercising 60 minutes 2-3 days per week.   Interval History:  Since last office visit she has lost 2 pounds.  She is struggling with hunger and cravings. She is eating BF:  yogurt with cheese and deli meat, snack: none, lunch:  sandwich with cheese and deli meat with fruit, snack: none and dinner:  at [employer].  She is drinking wine (2 glasses daily) and water.     Pharmacotherapy for weight loss: She is not currently taking medications  for medical weight loss.      Previous pharmacotherapy for medical weight loss:  She reports trying a medication years ago for weight loss and did well.    Bariatric surgery:  Never had bariatric surgery   Pharmacotherapy for DMT2:  She was prescribed Ozempic after her last visit but hasn't started taking it yet. Requesting [place] to show her how to use the Ozempic pen.     Last A1c was 6.5 She is not checking BS at home.   She has not tried any medications in the past.    Lab Results  Component Value Date   HGBA1C 6.5 10/12/2022   HGBA1C 6.4 06/05/2022    HGBA1C 6.4 02/05/2022   Lab Results  Component Value Date   LDLCALC 115 (H) 10/12/2022   CREATININE 0.77 10/12/2022    PHYSICAL EXAM:  Blood pressure 135/80, pulse 72, temperature 97.7 F (36.5 C), height '5\' 5"'$  (1.651 m), weight 205 lb (93 kg), SpO2 93 %. Body mass index is 34.11 kg/m.  General: She is overweight, cooperative, alert, well developed, and in no acute distress. PSYCH: Has normal mood, affect and thought process.   Extremities: No edema.  Neurologic: No gross sensory or motor deficits. No tremors or fasciculations noted.    DIAGNOSTIC DATA REVIEWED:  BMET    Component Value Date/Time   NA 135 10/12/2022 1245   NA 137 09/28/2018 1147   K 5.0 10/12/2022 1245   CL 98 10/12/2022 1245   CO2 28 10/12/2022 1245   GLUCOSE 116 (H) 10/12/2022 1245   BUN 21 10/12/2022 1245   BUN 9 09/28/2018 1147   CREATININE 0.77 10/12/2022 1245   CREATININE 0.76 01/02/2022 1331   CALCIUM 9.5 10/12/2022 1245   GFRNONAA >60 03/02/2022 1307   GFRNONAA >60 01/02/2022 1331   GFRAA 94 09/28/2018 1147   Lab Results  Component Value Date   HGBA1C 6.5 10/12/2022   HGBA1C 6.4 10/18/2017   Lab Results  Component Value Date   INSULIN 7.9 10/26/2022  INSULIN 15.5 05/30/2018   Lab Results  Component Value Date   TSH 4.17 10/12/2022   CBC    Component Value Date/Time   WBC 5.1 10/12/2022 1245   RBC 4.26 10/12/2022 1245   HGB 14.1 10/12/2022 1245   HGB 13.0 01/02/2022 1331   HCT 41.0 10/12/2022 1245   PLT 237.0 10/12/2022 1245   PLT 197 01/02/2022 1331   MCV 96.3 10/12/2022 1245   MCH 32.3 03/02/2022 1307   MCHC 34.4 10/12/2022 1245   RDW 13.8 10/12/2022 1245   Iron Studies No results found for: "IRON", "TIBC", "FERRITIN", "IRONPCTSAT" Lipid Panel     Component Value Date/Time   CHOL 216 (H) 10/12/2022 1245   CHOL 226 (H) 09/28/2018 1147   TRIG 77.0 10/12/2022 1245   HDL 85.40 10/12/2022 1245   HDL 60 09/28/2018 1147   CHOLHDL 3 10/12/2022 1245   VLDL 15.4  10/12/2022 1245   LDLCALC 115 (H) 10/12/2022 1245   LDLCALC 134 (H) 09/28/2018 1147   LDLDIRECT 138.0 05/15/2019 1022   Hepatic Function Panel     Component Value Date/Time   PROT 7.0 10/12/2022 1245   PROT 6.7 09/28/2018 1147   ALBUMIN 4.6 10/12/2022 1245   ALBUMIN 4.6 09/28/2018 1147   AST 18 10/12/2022 1245   AST 20 01/02/2022 1331   ALT 16 10/12/2022 1245   ALT 19 01/02/2022 1331   ALKPHOS 95 10/12/2022 1245   BILITOT 0.4 10/12/2022 1245   BILITOT 0.3 01/02/2022 1331      Component Value Date/Time   TSH 4.17 10/12/2022 1245   Nutritional Lab Results  Component Value Date   VD25OH 41.92 10/12/2022   VD25OH 34.14 06/04/2022   VD25OH 29.65 (L) 02/05/2022     ASSESSMENT AND PLAN  TREATMENT PLAN FOR OBESITY:  Recommended Dietary Goals  Catherine Coleman is currently in the action stage of change. As such, her goal is to continue weight management plan. She has agreed to the Category 2 Plan.  Behavioral Intervention  We discussed the following Behavioral Modification Strategies today: increasing lean protein intake, decreasing simple carbohydrates , increasing vegetables, avoiding skipping meals, increasing water intake, and work on meal planning and easy cooking plans.  Additional resources provided today: NA  Recommended Physical Activity Goals  Catherine Coleman has been advised to work up to 150 minutes of moderate intensity aerobic activity a week and strengthening exercises 2-3 times per week for cardiovascular health, weight loss maintenance and preservation of muscle mass.   She has agreed to increase physical activity in their day and reduce sedentary time (increase NEAT).  and continue physical activity as is.    Pharmacotherapy We discussed various medication options to help Catherine Coleman with her weight loss efforts and we both agreed to start Ozempic 0.'25mg'$  for DMT2.  ASSOCIATED CONDITIONS ADDRESSED TODAY  Action/Plan  Diabetes mellitus type 2 in obese (Catherine Coleman) Ozempic  0.'25mg'$  given in office today.  All questions answered.    Side effects discussed.   Good blood sugar control is important to decrease the likelihood of diabetic complications such as nephropathy, neuropathy, limb loss, blindness, coronary artery disease, and death. Intensive lifestyle modification including diet, exercise and weight loss are the first line of treatment for diabetes.    Generalized obesity  BMI 34.0-34.9,adult         Return in about 3 weeks (around 12/14/2022).Marland Kitchen She was informed of the importance of frequent follow up visits to maximize her success with intensive lifestyle modifications for her multiple health conditions.   ATTESTASTION  STATEMENTS:  Reviewed by clinician on day of visit: allergies, medications, problem list, medical history, surgical history, family history, social history, and previous encounter notes.   Time spent on visit including pre-visit chart review and post-visit care and charting was 30 minutes.    Ailene Rud. Hadlee Burback FNP-C

## 2022-11-24 NOTE — Progress Notes (Signed)
Chief Complaint:   OBESITY Clareen is here to discuss her progress with her obesity treatment plan along with follow-up of her obesity related diagnoses. Jacquelynn is on the Category 2 plan and states she is following her eating plan approximately 60% of the time. Jacoria states she is walking in water for 60-120 minutes 3 times per week.  Today's visit was #: 2 Starting weight: 211 lbs Starting date: 10/26/22 Today's weight: 207 lbs Today's date: 11/09/22 Total lbs lost to date: 4 Total lbs lost since last in-office visit: 4  Interim History: She is here for her first follow-up and is down 4 pounds.  Subjective:   1. Diabetes mellitus type 2 in obese (H[facility]) Taking metformin in the a.m. at breakfast.  2. Primary hypertension Taking Cozaar.  Reasonably well-controlled.  3. Hyperlipidemia associated with type 2 diabetes mellitus ([place]) Taking Crestor.  Assessment/Plan:   1. Diabetes mellitus type 2 in obese ([place]) 1.  Continue medications. 2.  New prescription- - Semaglutide,0.25 or 0.'5MG'$ /DOS, (OZEMPIC, 0.25 OR 0.5 MG/DOSE,) 2 MG/3ML SOPN; Will inject 0.25 mg into the skin weekly. (Patient not taking: Reported on 11/23/2022)  Dispense: 3 mL; Refill: 0 3.  Hypoglycemia handout.  2. Primary hypertension 1.  Will continue medications.  3. Hyperlipidemia associated with type 2 diabetes mellitus ([place]) Continue Crestor.  4. Generalized obesity BMI 34.0-34.9,adult 1.  Meal planning. 2.  Intentional eating. 3.  Reviewed labs from 10/07/2022: CMP, lipids, vitamin D, CBC, hemoglobin A1c, glucose, TSH. 4.  Increase protein and water.  Schwanda is currently in the action stage of change. As such, her goal is to continue with weight loss efforts. She has agreed to the Category 2 plan.  Exercise goals:  as is.  Add exercises for balance and core.  Behavioral modification strategies: increasing lean protein intake, decreasing simple carbohydrates, increasing vegetables, increasing  water intake, decreasing eating out, no skipping meals, meal planning and cooking strategies, keeping healthy foods in the home, and planning for success.  Carolene has agreed to follow-up with our clinic in 2 weeks with [facility]. She was informed of the importance of frequent follow-up visits to maximize her success with intensive lifestyle modifications for her multiple health conditions.   Objective:   Blood pressure (!) 147/76, pulse 62, temperature 97.8 F (36.6 C), height '5\' 5"'$  (1.651 m), weight 207 lb (93.9 kg), SpO2 93 %. Body mass index is 34.45 kg/m.  General: Cooperative, alert, well developed, in no acute distress. HEENT: Conjunctivae and lids unremarkable. Cardiovascular: Regular rhythm.  Lungs: Normal work of breathing. Neurologic: No focal deficits.   Lab Results  Component Value Date   CREATININE 0.77 10/12/2022   BUN 21 10/12/2022   NA 135 10/12/2022   K 5.0 10/12/2022   CL 98 10/12/2022   CO2 28 10/12/2022   Lab Results  Component Value Date   ALT 16 10/12/2022   AST 18 10/12/2022   ALKPHOS 95 10/12/2022   BILITOT 0.4 10/12/2022   Lab Results  Component Value Date   HGBA1C 6.5 10/12/2022   HGBA1C 6.4 06/05/2022   HGBA1C 6.4 02/05/2022   HGBA1C 6.7 (H) 07/22/2021   HGBA1C 6.2 10/09/2020   Lab Results  Component Value Date   INSULIN 7.9 10/26/2022   INSULIN 11.5 06/12/2019   INSULIN 9.8 09/28/2018   INSULIN 15.5 05/30/2018   Lab Results  Component Value Date   TSH 4.17 10/12/2022   Lab Results  Component Value Date   CHOL 216 (H) 10/12/2022  HDL 85.40 10/12/2022   LDLCALC 115 (H) 10/12/2022   LDLDIRECT 138.0 05/15/2019   TRIG 77.0 10/12/2022   CHOLHDL 3 10/12/2022   Lab Results  Component Value Date   VD25OH 41.92 10/12/2022   VD25OH 34.14 06/04/2022   VD25OH 29.65 (L) 02/05/2022   Lab Results  Component Value Date   WBC 5.1 10/12/2022   HGB 14.1 10/12/2022   HCT 41.0 10/12/2022   MCV 96.3 10/12/2022   PLT 237.0 10/12/2022    No results found for: "IRON", "TIBC", "FERRITIN"  Attestation Statements:   Reviewed by clinician on day of visit: allergies, medications, problem list, medical history, surgical history, family history, social history, and previous encounter notes.  I, Dawn Whitmire, FNP-C, am acting as transcriptionist for Dr. Jearld Lesch.  I have reviewed the above documentation for accuracy and completeness, and I agree with the above. Jearld Lesch, DO

## 2022-11-28 ENCOUNTER — Ambulatory Visit
Admission: RE | Admit: 2022-11-28 | Discharge: 2022-11-28 | Disposition: A | Discharge status: Home / Self Care | Payer: Medicare Other | Source: Ambulatory Visit | Attending: Hematology and Oncology | Admitting: Hematology and Oncology

## 2022-11-28 DIAGNOSIS — R928 Other abnormal and inconclusive findings on diagnostic imaging of breast: Secondary | ICD-10-CM | POA: Diagnosis not present

## 2022-11-28 DIAGNOSIS — Z853 Personal history of malignant neoplasm of breast: Secondary | ICD-10-CM | POA: Diagnosis not present

## 2022-11-28 DIAGNOSIS — C50312 Malignant neoplasm of lower-inner quadrant of left female breast: Secondary | ICD-10-CM

## 2022-12-07 ENCOUNTER — Other Ambulatory Visit: Payer: Self-pay | Admitting: Bariatrics

## 2022-12-07 DIAGNOSIS — E1169 Type 2 diabetes mellitus with other specified complication: Secondary | ICD-10-CM

## 2022-12-10 ENCOUNTER — Encounter: Payer: Self-pay | Admitting: Nurse Practitioner

## 2022-12-10 ENCOUNTER — Ambulatory Visit (INDEPENDENT_AMBULATORY_CARE_PROVIDER_SITE_OTHER): Payer: Medicare Other | Admitting: Nurse Practitioner

## 2022-12-10 ENCOUNTER — Ambulatory Visit: Payer: Medicare Other | Admitting: Nurse Practitioner

## 2022-12-10 VITALS — BP 124/79 | HR 62 | Temp 98.5°F | Ht 65.0 in | Wt 199.0 lb

## 2022-12-10 DIAGNOSIS — Z7985 Long-term (current) use of injectable non-insulin antidiabetic drugs: Secondary | ICD-10-CM | POA: Diagnosis not present

## 2022-12-10 DIAGNOSIS — E1169 Type 2 diabetes mellitus with other specified complication: Secondary | ICD-10-CM | POA: Diagnosis not present

## 2022-12-10 DIAGNOSIS — I10 Essential (primary) hypertension: Secondary | ICD-10-CM

## 2022-12-10 DIAGNOSIS — E669 Obesity, unspecified: Secondary | ICD-10-CM | POA: Diagnosis not present

## 2022-12-10 DIAGNOSIS — Z6833 Body mass index (BMI) 33.0-33.9, adult: Secondary | ICD-10-CM

## 2022-12-10 MED ORDER — NOVOFINE PLUS PEN NEEDLE 32G X 4 MM MISC: 0 refills | Status: DC

## 2022-12-10 MED ORDER — OZEMPIC (0.25 OR 0.5 MG/DOSE) 2 MG/3ML ~~LOC~~ SOPN: PEN_INJECTOR | SUBCUTANEOUS | 0 refills | Status: DC

## 2022-12-10 NOTE — Progress Notes (Signed)
Office: [phone]  /  Fax: [phone]  WEIGHT SUMMARY AND BIOMETRICS  Weight Lost Since Last Visit: 6lb  No data recorded  Vitals Temp: 98.5 F (36.9 C) BP: 124/79 Pulse Rate: 62 SpO2: 94 %   Anthropometric Measurements Height: 5\' 5"  (1.651 m) Weight: 199 lb (90.3 kg) BMI (Calculated): 33.12 Weight at Last Visit: 205lb Weight Lost Since Last Visit: 6lb Starting Weight: 211lb Total Weight Loss (lbs): 12 lb (5.443 kg)   Body Composition  Body Fat %: 46.8 % Fat Mass (lbs): 93.6 lbs Muscle Mass (lbs): 100.8 lbs Total Body Water (lbs): 71.8 lbs Visceral Fat Rating : 16   Other Clinical Data Today's Visit #: 4 Starting Date: 10/26/22     HPI  Chief Complaint: OBESITY  Catherine Coleman is here to discuss her progress with her obesity treatment plan. She is on the the Category 2 Plan and states she is following her eating plan approximately 70 % of the time. She states she is exercising 60 minutes 3 days per week.   Interval History:  Since last office visit she has lost 6 pounds.  Reports polyphagia and cravings.  She is not skipping meals. Aiming to eat more protein.  She is working on Designer, fashion/clothing intake. She is overall pleased because her clothes are fitting better. Her highest weight was 224 lbs.     Hypertension Hypertension:  last 2 visits BP has looked better.  Medication(s): Cozaar 50mg .  Her goal is to stop her BP meds.   Denies chest pain, palpitations and SOB.  BP Readings from Last 3 Encounters:  12/10/22 124/79  11/23/22 135/80  11/09/22 (!) 147/76   Lab Results  Component Value Date   CREATININE 0.77 10/12/2022   CREATININE 0.82 06/04/2022   CREATININE 0.71 03/02/2022    Pharmacotherapy for DMT2:  She is currently taking Ozempic 0.25mg  (has taken 3 doses).  Reports some side effects of fatigue and her reflux has gotten worse since starting Ozempic.  She has had 2 episodes of vomiting. She was taking Metformin and stopped when she started  Ozempic.  Last A1c was 6.5 She is not checking BS at home.   Episodes of hypoglycemia: yes - reports one episode of possible low sugar and took a glucose tablet.  Felt better afterwards.   On ARB, ASA 81mg  and statin.  Last eye exam:  2024   Lab Results  Component Value Date   HGBA1C 6.5 10/12/2022   HGBA1C 6.4 06/05/2022   HGBA1C 6.4 02/05/2022   Lab Results  Component Value Date   LDLCALC 115 (H) 10/12/2022   CREATININE 0.77 10/12/2022    PHYSICAL EXAM:  Blood pressure 124/79, pulse 62, temperature 98.5 F (36.9 C), height 5\' 5"  (1.651 m), weight 199 lb (90.3 kg), SpO2 94 %. Body mass index is 33.12 kg/m.  General: She is overweight, cooperative, alert, well developed, and in no acute distress. PSYCH: Has normal mood, affect and thought process.   Extremities: No edema.  Neurologic: No gross sensory or motor deficits. No tremors or fasciculations noted.    DIAGNOSTIC DATA REVIEWED:  BMET    Component Value Date/Time   NA 135 10/12/2022 1245   NA 137 09/28/2018 1147   K 5.0 10/12/2022 1245   CL 98 10/12/2022 1245   CO2 28 10/12/2022 1245   GLUCOSE 116 (H) 10/12/2022 1245   BUN 21 10/12/2022 1245   BUN 9 09/28/2018 1147   CREATININE 0.77 10/12/2022 1245   CREATININE 0.76 01/02/2022 1331  CALCIUM 9.5 10/12/2022 1245   GFRNONAA >60 03/02/2022 1307   GFRNONAA >60 01/02/2022 1331   GFRAA 94 09/28/2018 1147   Lab Results  Component Value Date   HGBA1C 6.5 10/12/2022   HGBA1C 6.4 10/18/2017   Lab Results  Component Value Date   INSULIN 7.9 10/26/2022   INSULIN 15.5 05/30/2018   Lab Results  Component Value Date   TSH 4.17 10/12/2022   CBC    Component Value Date/Time   WBC 5.1 10/12/2022 1245   RBC 4.26 10/12/2022 1245   HGB 14.1 10/12/2022 1245   HGB 13.0 01/02/2022 1331   HCT 41.0 10/12/2022 1245   PLT 237.0 10/12/2022 1245   PLT 197 01/02/2022 1331   MCV 96.3 10/12/2022 1245   MCH 32.3 03/02/2022 1307   MCHC 34.4 10/12/2022 1245   RDW 13.8  10/12/2022 1245   Iron Studies No results found for: "IRON", "TIBC", "FERRITIN", "IRONPCTSAT" Lipid Panel     Component Value Date/Time   CHOL 216 (H) 10/12/2022 1245   CHOL 226 (H) 09/28/2018 1147   TRIG 77.0 10/12/2022 1245   HDL 85.40 10/12/2022 1245   HDL 60 09/28/2018 1147   CHOLHDL 3 10/12/2022 1245   VLDL 15.4 10/12/2022 1245   LDLCALC 115 (H) 10/12/2022 1245   LDLCALC 134 (H) 09/28/2018 1147   LDLDIRECT 138.0 05/15/2019 1022   Hepatic Function Panel     Component Value Date/Time   PROT 7.0 10/12/2022 1245   PROT 6.7 09/28/2018 1147   ALBUMIN 4.6 10/12/2022 1245   ALBUMIN 4.6 09/28/2018 1147   AST 18 10/12/2022 1245   AST 20 01/02/2022 1331   ALT 16 10/12/2022 1245   ALT 19 01/02/2022 1331   ALKPHOS 95 10/12/2022 1245   BILITOT 0.4 10/12/2022 1245   BILITOT 0.3 01/02/2022 1331      Component Value Date/Time   TSH 4.17 10/12/2022 1245   Nutritional Lab Results  Component Value Date   VD25OH 41.92 10/12/2022   VD25OH 34.14 06/04/2022   VD25OH 29.65 (L) 02/05/2022     ASSESSMENT AND PLAN  TREATMENT PLAN FOR OBESITY:  Recommended Dietary Goals  Catherine Coleman is currently in the action stage of change. As such, her goal is to continue weight management plan. She has agreed to the Category 2 Plan.  Behavioral Intervention  We discussed the following Behavioral Modification Strategies today: increasing lean protein intake, decreasing simple carbohydrates , increasing vegetables, avoiding skipping meals, increasing water intake, work on meal planning and easy cooking plans, planning for success, and keeping healthy foods at home.  Additional resources provided today: NA  Recommended Physical Activity Goals  Catherine Coleman has been advised to work up to 150 minutes of moderate intensity aerobic activity a week and strengthening exercises 2-3 times per week for cardiovascular health, weight loss maintenance and preservation of muscle mass.   She has agreed to Continue  current level of physical activity    ASSOCIATED CONDITIONS ADDRESSED TODAY  Action/Plan  Diabetes mellitus type 2 in obese (HCC) -    Continue Ozempic (0.25 or 0.5 MG/DOSE); Will inject 0.25 mg into the skin weekly.  Dispense: 3 mL; Refill: 0.  Will not increase dose at this time.   -     NovoFine Plus Pen Needle; Use as directed with Ozempic  Dispense: 30 each; Refill: 0  Essential hypertension Continue to follow up with PCP.  Continue meds as directed    Generalized obesity  BMI 33.0-33.9,adult         Return  in about 2 weeks (around 12/24/2022).Marland Kitchen She was informed of the importance of frequent follow up visits to maximize her success with intensive lifestyle modifications for her multiple health conditions.   ATTESTASTION STATEMENTS:  Reviewed by clinician on day of visit: allergies, medications, problem list, medical history, surgical history, family history, social history, and previous encounter notes.    Ailene Rud. Kaleb Sek FNP-C

## 2022-12-15 DIAGNOSIS — D485 Neoplasm of uncertain behavior of skin: Secondary | ICD-10-CM | POA: Diagnosis not present

## 2022-12-15 DIAGNOSIS — L820 Inflamed seborrheic keratosis: Secondary | ICD-10-CM | POA: Diagnosis not present

## 2022-12-15 DIAGNOSIS — L814 Other melanin hyperpigmentation: Secondary | ICD-10-CM | POA: Diagnosis not present

## 2022-12-16 ENCOUNTER — Ambulatory Visit: Payer: Medicare Other | Admitting: Nurse Practitioner

## 2022-12-17 ENCOUNTER — Telehealth: Payer: Self-pay | Admitting: Family Medicine

## 2022-12-17 ENCOUNTER — Other Ambulatory Visit: Payer: Self-pay

## 2022-12-17 DIAGNOSIS — E039 Hypothyroidism, unspecified: Secondary | ICD-10-CM

## 2022-12-17 DIAGNOSIS — E559 Vitamin D deficiency, unspecified: Secondary | ICD-10-CM

## 2022-12-17 DIAGNOSIS — I10 Essential (primary) hypertension: Secondary | ICD-10-CM

## 2022-12-17 DIAGNOSIS — E1169 Type 2 diabetes mellitus with other specified complication: Secondary | ICD-10-CM

## 2022-12-17 DIAGNOSIS — R739 Hyperglycemia, unspecified: Secondary | ICD-10-CM

## 2022-12-17 NOTE — Telephone Encounter (Signed)
Pt called to request lab order to be put in for a lab visit prior to her scheduled VV for her 3 month F/U

## 2022-12-17 NOTE — Telephone Encounter (Signed)
Called pt lab orders are placed  And lab appt was made.

## 2022-12-20 ENCOUNTER — Other Ambulatory Visit: Payer: Self-pay | Admitting: Family Medicine

## 2022-12-21 DIAGNOSIS — M47896 Other spondylosis, lumbar region: Secondary | ICD-10-CM | POA: Diagnosis not present

## 2022-12-21 DIAGNOSIS — M545 Low back pain, unspecified: Secondary | ICD-10-CM | POA: Diagnosis not present

## 2022-12-30 ENCOUNTER — Encounter: Payer: Self-pay | Admitting: Nurse Practitioner

## 2022-12-30 ENCOUNTER — Ambulatory Visit (INDEPENDENT_AMBULATORY_CARE_PROVIDER_SITE_OTHER): Payer: Medicare Other | Admitting: Nurse Practitioner

## 2022-12-30 VITALS — BP 123/82 | HR 68 | Temp 97.5°F | Ht 65.0 in | Wt 196.0 lb

## 2022-12-30 DIAGNOSIS — E669 Obesity, unspecified: Secondary | ICD-10-CM | POA: Diagnosis not present

## 2022-12-30 DIAGNOSIS — Z7985 Long-term (current) use of injectable non-insulin antidiabetic drugs: Secondary | ICD-10-CM

## 2022-12-30 DIAGNOSIS — E119 Type 2 diabetes mellitus without complications: Secondary | ICD-10-CM | POA: Diagnosis not present

## 2022-12-30 DIAGNOSIS — Z6832 Body mass index (BMI) 32.0-32.9, adult: Secondary | ICD-10-CM

## 2022-12-30 NOTE — Progress Notes (Signed)
Office: [phone]  /  Fax: [phone]  WEIGHT SUMMARY AND BIOMETRICS  Weight Lost Since Last Visit: 3lb  No data recorded  Vitals Temp: (!) 97.5 F (36.4 C) BP: 123/82 Pulse Rate: 68 SpO2: 96 %   Anthropometric Measurements Height: 5\' 5"  (1.651 m) Weight: 196 lb (88.9 kg) BMI (Calculated): 32.62 Weight at Last Visit: 199lb Weight Lost Since Last Visit: 3lb Starting Weight: 211lb Total Weight Loss (lbs): 15 lb (6.804 kg)   Body Composition  Body Fat %: 47.9 % Fat Mass (lbs): 93.8 lbs Muscle Mass (lbs): 97 lbs Total Body Water (lbs): 74.4 lbs Visceral Fat Rating : 16   Other Clinical Data Today's Visit #: 5 Starting Date: 10/26/22     HPI  Chief Complaint: OBESITY  Catherine Coleman is here to discuss her progress with her obesity treatment plan. She is on the the Category 2 Plan and states she is following her eating plan approximately 50 % of the time. She states she is exercising 60 minutes 3 days per week.   Interval History:  Since last office visit she has lost 3 pounds.  She struggles with dinner due to eating at the facility she lives at.  Is concerned about what they cook with. She is drinking water daily but not enough.  She stopped drinking wine since her last visit.   Her highest weight was 214 lbs.    Pharmacotherapy for DMT2:  She is currently taking Ozempic 0.25mg .  Denies side effects.  She is concerned about being able to continue taking Ozempic due to cost.   Last A1c was 6.5 She is not checking BS at home.   Episodes of hypoglycemia: no On ARB, ASA 81mg  and statin.  Last eye exam:  2004  Lab Results  Component Value Date   HGBA1C 6.5 10/12/2022   HGBA1C 6.4 06/05/2022   HGBA1C 6.4 02/05/2022   Lab Results  Component Value Date   LDLCALC 115 (H) 10/12/2022   CREATININE 0.77 10/12/2022     PHYSICAL EXAM:  Blood pressure 123/82, pulse 68, temperature (!) 97.5 F (36.4 C), height 5\' 5"  (1.651 m), weight 196 lb (88.9 kg), SpO2 96  %. Body mass index is 32.62 kg/m.  General: She is overweight, cooperative, alert, well developed, and in no acute distress. PSYCH: Has normal mood, affect and thought process.   Extremities: No edema.  Neurologic: No gross sensory or motor deficits. No tremors or fasciculations noted.    DIAGNOSTIC DATA REVIEWED:  BMET    Component Value Date/Time   NA 135 10/12/2022 1245   NA 137 09/28/2018 1147   K 5.0 10/12/2022 1245   CL 98 10/12/2022 1245   CO2 28 10/12/2022 1245   GLUCOSE 116 (H) 10/12/2022 1245   BUN 21 10/12/2022 1245   BUN 9 09/28/2018 1147   CREATININE 0.77 10/12/2022 1245   CREATININE 0.76 01/02/2022 1331   CALCIUM 9.5 10/12/2022 1245   GFRNONAA >60 03/02/2022 1307   GFRNONAA >60 01/02/2022 1331   GFRAA 94 09/28/2018 1147   Lab Results  Component Value Date   HGBA1C 6.5 10/12/2022   HGBA1C 6.4 10/18/2017   Lab Results  Component Value Date   INSULIN 7.9 10/26/2022   INSULIN 15.5 05/30/2018   Lab Results  Component Value Date   TSH 4.17 10/12/2022   CBC    Component Value Date/Time   WBC 5.1 10/12/2022 1245   RBC 4.26 10/12/2022 1245   HGB 14.1 10/12/2022 1245   HGB 13.0 01/02/2022 1331  HCT 41.0 10/12/2022 1245   PLT 237.0 10/12/2022 1245   PLT 197 01/02/2022 1331   MCV 96.3 10/12/2022 1245   MCH 32.3 03/02/2022 1307   MCHC 34.4 10/12/2022 1245   RDW 13.8 10/12/2022 1245   Iron Studies No results found for: "IRON", "TIBC", "FERRITIN", "IRONPCTSAT" Lipid Panel     Component Value Date/Time   CHOL 216 (H) 10/12/2022 1245   CHOL 226 (H) 09/28/2018 1147   TRIG 77.0 10/12/2022 1245   HDL 85.40 10/12/2022 1245   HDL 60 09/28/2018 1147   CHOLHDL 3 10/12/2022 1245   VLDL 15.4 10/12/2022 1245   LDLCALC 115 (H) 10/12/2022 1245   LDLCALC 134 (H) 09/28/2018 1147   LDLDIRECT 138.0 05/15/2019 1022   Hepatic Function Panel     Component Value Date/Time   PROT 7.0 10/12/2022 1245   PROT 6.7 09/28/2018 1147   ALBUMIN 4.6 10/12/2022 1245    ALBUMIN 4.6 09/28/2018 1147   AST 18 10/12/2022 1245   AST 20 01/02/2022 1331   ALT 16 10/12/2022 1245   ALT 19 01/02/2022 1331   ALKPHOS 95 10/12/2022 1245   BILITOT 0.4 10/12/2022 1245   BILITOT 0.3 01/02/2022 1331      Component Value Date/Time   TSH 4.17 10/12/2022 1245   Nutritional Lab Results  Component Value Date   VD25OH 41.92 10/12/2022   VD25OH 34.14 06/04/2022   VD25OH 29.65 (L) 02/05/2022     ASSESSMENT AND PLAN  TREATMENT PLAN FOR OBESITY:  Recommended Dietary Goals  Catherine Coleman is currently in the action stage of change. As such, her goal is to continue weight management plan. She has agreed to the Category 2 Plan.  Behavioral Intervention  We discussed the following Behavioral Modification Strategies today: increasing lean protein intake, decreasing simple carbohydrates , increasing vegetables, increasing lower glycemic fruits, increasing water intake, continue to practice mindfulness when eating, and planning for success.  Additional resources provided today: NA  Recommended Physical Activity Goals  Catherine Coleman has been advised to work up to 150 minutes of moderate intensity aerobic activity a week and strengthening exercises 2-3 times per week for cardiovascular health, weight loss maintenance and preservation of muscle mass.   She has agreed to Continue current level of physical activity    Pharmacotherapy We discussed various medication options to help Catherine Coleman with her weight loss efforts and we both agreed to continue Ozempic 0.25mg  for DMT2.  ASSOCIATED CONDITIONS ADDRESSED TODAY  Action/Plan  Diabetes mellitus without complication Continue Ozempic 0.25mg .  side effects discussed.   Good blood sugar control is important to decrease the likelihood of diabetic complications such as nephropathy, neuropathy, limb loss, blindness, coronary artery disease, and death. Intensive lifestyle modification including diet, exercise and weight loss are the first  line of treatment for diabetes.    Generalized obesity  BMI 32.0-32.9,adult       She is scheduled to see her PCP in June for follow up and labs.    Return in about 3 weeks (around 01/20/2023).Marland Kitchen She was informed of the importance of frequent follow up visits to maximize her success with intensive lifestyle modifications for her multiple health conditions.   ATTESTASTION STATEMENTS:  Reviewed by clinician on day of visit: allergies, medications, problem list, medical history, surgical history, family history, social history, and previous encounter notes.   Time spent on visit including pre-visit chart review and post-visit care and charting was 30 minutes.    Theodis Sato. Shalev Helminiak FNP-C

## 2023-01-06 DIAGNOSIS — M545 Low back pain, unspecified: Secondary | ICD-10-CM | POA: Diagnosis not present

## 2023-01-15 ENCOUNTER — Other Ambulatory Visit (INDEPENDENT_AMBULATORY_CARE_PROVIDER_SITE_OTHER): Payer: Medicare Other

## 2023-01-15 ENCOUNTER — Other Ambulatory Visit (HOSPITAL_BASED_OUTPATIENT_CLINIC_OR_DEPARTMENT_OTHER): Payer: Self-pay

## 2023-01-15 DIAGNOSIS — E785 Hyperlipidemia, unspecified: Secondary | ICD-10-CM

## 2023-01-15 DIAGNOSIS — E039 Hypothyroidism, unspecified: Secondary | ICD-10-CM | POA: Diagnosis not present

## 2023-01-15 DIAGNOSIS — E1169 Type 2 diabetes mellitus with other specified complication: Secondary | ICD-10-CM

## 2023-01-15 DIAGNOSIS — I10 Essential (primary) hypertension: Secondary | ICD-10-CM

## 2023-01-15 DIAGNOSIS — R739 Hyperglycemia, unspecified: Secondary | ICD-10-CM | POA: Diagnosis not present

## 2023-01-15 DIAGNOSIS — E559 Vitamin D deficiency, unspecified: Secondary | ICD-10-CM

## 2023-01-15 LAB — CBC WITH DIFFERENTIAL/PLATELET
Basophils Absolute: 0 10*3/uL (ref 0.0–0.1)
Basophils Relative: 0.4 % (ref 0.0–3.0)
Eosinophils Absolute: 0.1 10*3/uL (ref 0.0–0.7)
Eosinophils Relative: 1.4 % (ref 0.0–5.0)
HCT: 40.1 % (ref 36.0–46.0)
Hemoglobin: 13.6 g/dL (ref 12.0–15.0)
Lymphocytes Relative: 20.1 % (ref 12.0–46.0)
Lymphs Abs: 1.1 10*3/uL (ref 0.7–4.0)
MCHC: 34 g/dL (ref 30.0–36.0)
MCV: 95.2 fl (ref 78.0–100.0)
Monocytes Absolute: 0.6 10*3/uL (ref 0.1–1.0)
Monocytes Relative: 10.8 % (ref 3.0–12.0)
Neutro Abs: 3.7 10*3/uL (ref 1.4–7.7)
Neutrophils Relative %: 67.3 % (ref 43.0–77.0)
Platelets: 236 10*3/uL (ref 150.0–400.0)
RBC: 4.21 Mil/uL (ref 3.87–5.11)
RDW: 12.8 % (ref 11.5–15.5)
WBC: 5.5 10*3/uL (ref 4.0–10.5)

## 2023-01-15 LAB — LIPID PANEL
Cholesterol: 119 mg/dL (ref 0–200)
HDL: 53.1 mg/dL (ref >39.00)
LDL Cholesterol: 51 mg/dL (ref 0–99)
NonHDL: 66.17
Total CHOL/HDL Ratio: 2
Triglycerides: 78 mg/dL (ref 0.0–149.0)
VLDL: 15.6 mg/dL (ref 0.0–40.0)

## 2023-01-15 LAB — COMPREHENSIVE METABOLIC PANEL
ALT: 13 U/L (ref 0–35)
AST: 16 U/L (ref 0–37)
Albumin: 4.1 g/dL (ref 3.5–5.2)
Alkaline Phosphatase: 91 U/L (ref 39–117)
BUN: 11 mg/dL (ref 6–23)
CO2: 28 mEq/L (ref 19–32)
Calcium: 9 mg/dL (ref 8.4–10.5)
Chloride: 99 mEq/L (ref 96–112)
Creatinine, Ser: 0.73 mg/dL (ref 0.40–1.20)
GFR: 73.17 mL/min (ref >60.00)
Glucose, Bld: 98 mg/dL (ref 70–99)
Potassium: 4.5 mEq/L (ref 3.5–5.1)
Sodium: 136 mEq/L (ref 135–145)
Total Bilirubin: 0.5 mg/dL (ref 0.2–1.2)
Total Protein: 6.5 g/dL (ref 6.0–8.3)

## 2023-01-15 LAB — HEMOGLOBIN A1C: Hgb A1c MFr Bld: 6 % (ref 4.6–6.5)

## 2023-01-15 LAB — TSH: TSH: 0.79 u[IU]/mL (ref 0.35–5.50)

## 2023-01-15 LAB — VITAMIN D 25 HYDROXY (VIT D DEFICIENCY, FRACTURES): VITD: 62.5 ng/mL (ref 30.00–100.00)

## 2023-01-15 MED ORDER — COMIRNATY 30 MCG/0.3ML IM SUSY
PREFILLED_SYRINGE | INTRAMUSCULAR | 0 refills | Status: DC
  Filled 2023-01-15: qty 0.3, 1d supply, fill #0

## 2023-01-18 ENCOUNTER — Inpatient Hospital Stay: Payer: Medicare Other | Attending: Hematology and Oncology | Admitting: Hematology and Oncology

## 2023-01-18 ENCOUNTER — Other Ambulatory Visit: Payer: Self-pay

## 2023-01-18 VITALS — BP 116/65 | HR 72 | Temp 97.4°F | Resp 18 | Ht 65.0 in | Wt 196.9 lb

## 2023-01-18 DIAGNOSIS — Z87891 Personal history of nicotine dependence: Secondary | ICD-10-CM | POA: Insufficient documentation

## 2023-01-18 DIAGNOSIS — Z800 Family history of malignant neoplasm of digestive organs: Secondary | ICD-10-CM | POA: Diagnosis not present

## 2023-01-18 DIAGNOSIS — Z170 Estrogen receptor positive status [ER+]: Secondary | ICD-10-CM

## 2023-01-18 DIAGNOSIS — Z79811 Long term (current) use of aromatase inhibitors: Secondary | ICD-10-CM | POA: Insufficient documentation

## 2023-01-18 DIAGNOSIS — F1090 Alcohol use, unspecified, uncomplicated: Secondary | ICD-10-CM | POA: Diagnosis not present

## 2023-01-18 DIAGNOSIS — C50312 Malignant neoplasm of lower-inner quadrant of left female breast: Secondary | ICD-10-CM

## 2023-01-18 NOTE — Progress Notes (Signed)
[facility]  Telephone:([phone]) [phone] Fax:(336) [phone]     ID: Catherine Coleman DOB: [birth year]  MR#: [id number]  ONG#:[id number]  Patient Care Team: Bradd Canary, MD as PCP - General (Family Medicine) Croitoru, Rachelle Hora, MD as PCP - Cardiology (Cardiology) Pershing Proud, RN as Oncology Nurse Navigator Donnelly Angelica, RN as Oncology Nurse Navigator Magrinat, Valentino Hue, MD (Inactive) as Consulting Physician (Oncology) Abigail Miyamoto, MD as Consulting Physician (General Surgery) Dorothy Puffer, MD as Consulting Physician (Radiation Oncology) Sherre Scarlet, MD as Consulting Physician (Obstetrics and Gynecology) Letta Kocher, MD as Consulting Physician (Rehabilitation) Bettey Costa, MD as Consulting Physician (Dermatology) Henrene Pastor, RPH-CPP (Pharmacist) Rachel Moulds, MD  CHIEF COMPLAINT: Estrogen receptor positive breast cancer  CURRENT TREATMENT: anastrozole  INTERVAL HISTORY:  Catherine Coleman returns today for follow up of her estrogen receptor positive breast cancer.  She is tolerating anastrozole very well.   Most recent mammogram done on November 28, 2022, BI-RADS Category 2 benign.   She continues to do water aerobics, has not been walking much.  She is not really interested in repeating the bone density.  She tells me that it may not be very useful especially because they can only measure it on her forearm.  She is up-to-date with all her vaccinations. Rest of the pertinent 10 ROS reviewed and negative.    COVID 19 VACCINATION STATUS: Status post Moderna x2 with Moderna then [employer] boosters   HISTORY OF CURRENT ILLNESS: From the original intake note:  Catherine Coleman had routine screening mammography on 11/13/2020 showing a possible abnormality in the bilateral breasts. She underwent bilateral diagnostic mammography with tomography and left breast ultrasonography at [facility] on 12/02/2020 showing: breast density category B; 10 mm left breast mass  at 7 o'clock; no suspicious left axillary lymphadenopathy; tow groups of indeterminate calcifications involving upper-inner and upper-outer right breast, each group spanning up to 6 cm.  Accordingly on 12/11/2020 she proceeded to biopsy of the bilateral breast areas in question. The pathology from this procedure ([id number]) showed:  1. Left Breast  - invasive ductal carcinoma, grade 2.   - Prognostic indicators significant for: estrogen receptor, 95% positive and progesterone receptor, 60% positive, both with strong staining intensity. Proliferation marker Ki67 at 15%. HER2 equivocal by immunohistochemistry (2+), but negative by fluorescent in situ hybridization with a signals ratio 1.34 and number per cell 1.95. 2. Right Breast  - fat necrosis and dystrophic calcifications   Cancer Staging  Malignant neoplasm of lower-inner quadrant of left breast in female, estrogen receptor positive (HCC) Staging form: Breast, AJCC 8th Edition - Clinical stage from 12/18/2020: Stage IA (cT1b, cN0, cM0, G2, ER+, PR+, HER2-) - Signed by Lowella Dell, MD on 12/18/2020 Stage prefix: Initial diagnosis Histologic grading system: 3 grade system   The patient's subsequent history is as detailed below.   PAST MEDICAL HISTORY: 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   Constipation    Decreased hearing    Diverticulitis    Dry eyes    Fatigue    Floaters in visual field    Gall bladder disease    GERD (gastroesophageal reflux disease)    Glaucoma    Hiatal hernia with gastroesophageal reflux    History of blood transfusion    for Knee replacement   Hyperlipidemia, mild 04/02/2016   Hypertension    Hypothyroidism  Joint pain    Migraine aura without headache    Muscle pain    Muscle stiffness    Nasal congestion    Obesity 04/02/2016   Osteoarthritis    Parotiditis 04/21/2016    Pre-diabetes    Vitamin D deficiency 04/02/2016    PAST SURGICAL HISTORY: Past Surgical History:  Procedure Laterality Date   APPENDECTOMY     BREAST LUMPECTOMY WITH RADIOACTIVE SEED LOCALIZATION Left 01/21/2021   Procedure: LEFT BREAST LUMPECTOMY WITH RADIOACTIVE SEED LOCALIZATION;  Surgeon: Abigail Miyamoto, MD;  Location: [facility];  Service: General;  Laterality: Left;   cataract surgery  2008   CHOLECYSTECTOMY  2010   JOINT REPLACEMENT Left    2012   MASTOIDECTOMY     POSTERIOR CERVICAL FUSION/FORAMINOTOMY N/A 04/27/2017   Procedure: CERVICAL FIVE-SIX  OPEN REDUCTION OF FRACTURE, CERVICAL THREE-SEVEN  DORSAL FIXATION AND FUSION;  Surgeon: Ditty, Loura Halt, MD;  Location: [facility];  Service: Neurosurgery;  Laterality: N/A;   SHOULDER ARTHROSCOPY Bilateral prior to 2010   TONSILLECTOMY     TOTAL HIP ARTHROPLASTY Right 03/22/2015   Procedure: RIGHT TOTAL HIP ARTHROPLASTY ANTERIOR APPROACH;  Surgeon: Jodi Geralds, MD;  Location: [facility] OR;  Service: Orthopedics;  Laterality: Right;   TOTAL KNEE ARTHROPLASTY Bilateral 2007   TUBAL LIGATION      FAMILY HISTORY: Family History  Problem Relation Age of Onset   Dementia Mother    Hypertension Mother    Obesity Mother    Diabetes Mother    Heart disease Father    Hypertension Father    Stroke Father    Diabetes Father    Hyperlipidemia Father    Alcoholism Father    Obesity Father    Cancer Maternal Grandfather        colon cancer   Arthritis Daughter    Her father died at age 87 from MI. Her mother died at age 86 from dementia. Catherine Coleman has one brother (and no sisters). She reports colon cancer in her maternal grandfather. There is no family history of breast or ovarian cancer to her knowledge.   GYNECOLOGIC HISTORY:  No LMP recorded. Patient is postmenopausal. Menarche: 89 years old Age at first live birth: 89 years old GX P 4 LMP date unsure, "75-90?" Contraceptive: used from [phone] HRT never used   Hysterectomy? no BSO? 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 She is widowed. She lives independently by herself at [facility]. Daughter Abram Sander, age 30, works with computers in [place], [place]. Daughter Valentina Gu, age 72, is retired but Regulatory affairs officer and lives in [place], [place]. Daughter Noon Sipp, age 73, works in Audiological scientist estate in [place], [place]. Daughter Kaylee Prevatt, age 53, works with computers in [place], [place]. Verlon Au and [place] graduated from [employer]. Shakeema has 5 grandchildren, two of whom are MD's.    ADVANCED DIRECTIVES: in place   HEALTH MAINTENANCE: Social History   Tobacco Use   Smoking status: Never   Smokeless tobacco: Never   Tobacco comments:    Stopped 50 years ago.   Vaping Use   Vaping Use: Never used  Substance Use Topics   Alcohol use: Yes    Alcohol/week: 0.0 standard drinks of alcohol    Comment: socially 3-4x/wk   Drug use: No     Colonoscopy: 2014 (Dr. Lillia Mountain), repeat not indicated (age)  PAP: date unknown  Bone density: 07/2019, -1.7   No Known Allergies  Current Outpatient Medications  Medication Sig Dispense Refill   anastrozole (ARIMIDEX) 1 MG tablet Take 1 tablet (1 mg total) by mouth daily. Start 04/28/2021 90 tablet 4   Biotin 16109 MCG TABS Take 1 tablet by mouth daily.     brimonidine (ALPHAGAN) 0.2 % ophthalmic solution Place 1 drop into both eyes 2 (two) times daily. 5 mL 12   Calcium Carb-Cholecalciferol 600-10 MG-MCG TABS Take 1 tablet by mouth daily.     cephALEXin (KEFLEX) 500 MG capsule Take 1 capsule (500 mg total) by mouth 4 (four) times daily. 40 capsule 0   COVID-19 mRNA bivalent vaccine, Moderna, (MODERNA COVID-19 BIVAL BOOSTER) 50 MCG/0.5ML injection Inject into the muscle. 0.5 mL 0   COVID-19 mRNA vaccine 2023-2024 (COMIRNATY) SUSP injection Inject into the muscle. 0.3 mL 0   COVID-19 mRNA vaccine 2023-2024  (COMIRNATY) syringe Inject into the muscle. 0.3 mL 0   famotidine (PEPCID) 40 MG tablet TAKE 1 TABLET BY MOUTH AT  BEDTIME AS NEEDED 100 tablet 2   Insulin Pen Needle (NOVOFINE PLUS PEN NEEDLE) 32G X 4 MM MISC Use as directed with Ozempic 30 each 0   levothyroxine (SYNTHROID) 125 MCG tablet TAKE 1 TABLET BY MOUTH DAILY  BEFORE BREAKFAST 100 tablet 2   losartan (COZAAR) 50 MG tablet TAKE 1 TABLET BY MOUTH DAILY 100 tablet 2   MAGNESIUM-POTASSIUM PO Take 1 tablet by mouth daily.     metFORMIN (GLUCOPHAGE) 500 MG tablet Take 1 tablet (500 mg total) by mouth daily with breakfast. 30 tablet 0   Multiple Vitamins-Minerals (MULTIVITAMIN WITH MINERALS) tablet Take 1 tablet by mouth daily.     Multiple Vitamins-Minerals (PRESERVISION AREDS PO) Take 1 tablet by mouth daily.     MYRBETRIQ 50 MG TB24 tablet TAKE 1 TABLET BY MOUTH DAILY 100 tablet 2   omeprazole (PRILOSEC) 20 MG capsule TAKE 1 CAPSULE BY MOUTH DAILY 100 capsule 2   polyethylene glycol (MIRALAX / GLYCOLAX) packet Take 17 g by mouth daily. (Patient taking differently: Take 17 g by mouth daily as needed.) 14 each 0   rosuvastatin (CRESTOR) 5 MG tablet TAKE 1 TABLET BY MOUTH DAILY 100 tablet 2   RSV vaccine recomb adjuvanted (AREXVY) 120 MCG/0.5ML injection Inject into the muscle. 1 each 0   Semaglutide,0.25 or 0.5MG /DOS, (OZEMPIC, 0.25 OR 0.5 MG/DOSE,) 2 MG/3ML SOPN Will inject 0.25 mg into the skin weekly. 3 mL 0   Vitamin D, Cholecalciferol, 25 MCG (1000 UT) CAPS Take 1 capsule by mouth daily.     No current facility-administered medications for this visit.    OBJECTIVE: White woman who appears stated age  Vitals:   01/18/23 0924  BP: 116/65  Pulse: 72  Resp: 18  Temp: (!) 97.4 F (36.3 C)  SpO2: 94%       Body mass index is 32.77 kg/m.   Wt Readings from Last 3 Encounters:  01/18/23 196 lb 14.4 oz (89.3 kg)  12/30/22 196 lb (88.9 kg)  12/10/22 199 lb (90.3 kg)      ECOG FS:1 - Symptomatic but completely  ambulatory  Physical Exam Constitutional:      Appearance: Normal appearance.  Chest:     Comments: Bilateral breasts inspected.  No palpable masses or regional adenopathy  Musculoskeletal:     Cervical back: Normal range of motion and neck supple. No rigidity.  Lymphadenopathy:     Cervical: No cervical adenopathy.  Neurological:     Mental Status: She is alert.  LAB RESULTS:  CMP     Component Value Date/Time   NA 136 01/15/2023 0935   NA 137 09/28/2018 1147   K 4.5 01/15/2023 0935   CL 99 01/15/2023 0935   CO2 28 01/15/2023 0935   GLUCOSE 98 01/15/2023 0935   BUN 11 01/15/2023 0935   BUN 9 09/28/2018 1147   CREATININE 0.73 01/15/2023 0935   CREATININE 0.76 01/02/2022 1331   CALCIUM 9.0 01/15/2023 0935   PROT 6.5 01/15/2023 0935   PROT 6.7 09/28/2018 1147   ALBUMIN 4.1 01/15/2023 0935   ALBUMIN 4.6 09/28/2018 1147   AST 16 01/15/2023 0935   AST 20 01/02/2022 1331   ALT 13 01/15/2023 0935   ALT 19 01/02/2022 1331   ALKPHOS 91 01/15/2023 0935   BILITOT 0.5 01/15/2023 0935   BILITOT 0.3 01/02/2022 1331   GFRNONAA >60 03/02/2022 1307   GFRNONAA >60 01/02/2022 1331   GFRAA 94 09/28/2018 1147    No results found for: "TOTALPROTELP", "ALBUMINELP", "A1GS", "A2GS", "BETS", "BETA2SER", "GAMS", "MSPIKE", "SPEI"  Lab Results  Component Value Date   WBC 5.5 01/15/2023   NEUTROABS 3.7 01/15/2023   HGB 13.6 01/15/2023   HCT 40.1 01/15/2023   MCV 95.2 01/15/2023   PLT 236.0 01/15/2023    No results found for: "LABCA2"  No components found for: "WNUUVO536"  No results for input(s): "INR" in the last 168 hours.  No results found for: "LABCA2"  No results found for: "UYQ034"  No results found for: "CAN125"  No results found for: "CAN153"  No results found for: "CA2729"  No components found for: "HGQUANT"  No results found for: "CEA1", "CEA" / No results found for: "CEA1", "CEA"   No results found for: "AFPTUMOR"  No results found for:  "CHROMOGRNA"  No results found for: "KPAFRELGTCHN", "LAMBDASER", "KAPLAMBRATIO" (kappa/lambda light chains)  No results found for: "HGBA", "HGBA2QUANT", "HGBFQUANT", "HGBSQUAN" (Hemoglobinopathy evaluation)   No results found for: "LDH"  No results found for: "IRON", "TIBC", "IRONPCTSAT" (Iron and TIBC)  No results found for: "FERRITIN"  Urinalysis    Component Value Date/Time   COLORURINE YELLOW 01/06/2021 1454   APPEARANCEUR Sl Cloudy (A) 01/06/2021 1454   LABSPEC 1.015 01/06/2021 1454   PHURINE 6.0 01/06/2021 1454   GLUCOSEU NEGATIVE 01/06/2021 1454   HGBUR NEGATIVE 01/06/2021 1454   BILIRUBINUR Negative 11/03/2022 1010   KETONESUR NEGATIVE 01/06/2021 1454   PROTEINUR Negative 11/03/2022 1010   PROTEINUR NEGATIVE 05/14/2017 0757   UROBILINOGEN 1.0 11/03/2022 1010   UROBILINOGEN 0.2 01/06/2021 1454   NITRITE Negative 11/03/2022 1010   NITRITE POSITIVE (A) 01/06/2021 1454   LEUKOCYTESUR Small (1+) (A) 11/03/2022 1010   LEUKOCYTESUR SMALL (A) 01/06/2021 1454    STUDIES: No results found.   ELIGIBLE FOR AVAILABLE RESEARCH PROTOCOL: no  ASSESSMENT: 89 y.o. Catherine Coleman woman status post left breast lower inner quadrant biopsy 12/11/2020 for a clinical T1b N0, stage IA invasive ductal carcinoma, grade 2, estrogen and progesterone receptor positive, HER-2 not amplified, with an MIB-1 of 15%  (1) status post left lumpectomy 01/21/2021 for a pT1c pNX, stage IA invasive ductal carcinoma, grade 2, with negative margins  (2) adjuvant radiation 03/05/2021 through 04/02/2021 Site Technique Total Dose (Gy) Dose per Fx (Gy) Completed Fx Beam [employer]nergies  Breast, Left: Breast_Lt 3D 42.56/42.56 2.66 16/16 10X  Breast, Left: Breast_Lt_Bst specialPort 8/8 2 4/4 9E, 12E   (3) to start anastrozole 04/28/2021  (A) [facility] T score of -1.7   PLAN: Ms.  Navira is tolerating anastrozole very well.  She denies any complaints.  No changes in the breast.   Physical examination today without any concerns for recurrence.  Most recent mammogram without any concerns. She will continue anastrozole for total of 5 years.  She will complete this in August 2027 I encouraged her to start walking as well in addition to water aerobics to help the bone density.  She is not interested in repeating the bone density scan at this time. She wants to return to clinic in 1 year since she has been doing really well.  I think this is reasonable.  She knows to call for any other issue that may develop before the next visit  Total encounter time 20 minutes.*   *Total Encounter Time as defined by the Centers for Medicare and Medicaid Services includes, in addition to the face-to-face time of a patient visit (documented in the note above) non-face-to-face time: obtaining and reviewing outside history, ordering and reviewing medications, tests or procedures, care coordination (communications with other health care professionals or caregivers) and documentation in the medical record.

## 2023-01-19 ENCOUNTER — Ambulatory Visit: Payer: Medicare Other | Admitting: Family Medicine

## 2023-01-20 ENCOUNTER — Encounter: Payer: Self-pay | Admitting: Nurse Practitioner

## 2023-01-20 ENCOUNTER — Ambulatory Visit (INDEPENDENT_AMBULATORY_CARE_PROVIDER_SITE_OTHER): Payer: Medicare Other | Admitting: Nurse Practitioner

## 2023-01-20 VITALS — BP 125/78 | HR 71 | Temp 97.7°F | Ht 65.0 in | Wt 194.0 lb

## 2023-01-20 DIAGNOSIS — Z6832 Body mass index (BMI) 32.0-32.9, adult: Secondary | ICD-10-CM | POA: Diagnosis not present

## 2023-01-20 DIAGNOSIS — I10 Essential (primary) hypertension: Secondary | ICD-10-CM

## 2023-01-20 DIAGNOSIS — E119 Type 2 diabetes mellitus without complications: Secondary | ICD-10-CM | POA: Diagnosis not present

## 2023-01-20 DIAGNOSIS — E669 Obesity, unspecified: Secondary | ICD-10-CM | POA: Diagnosis not present

## 2023-01-20 DIAGNOSIS — Z7985 Long-term (current) use of injectable non-insulin antidiabetic drugs: Secondary | ICD-10-CM | POA: Diagnosis not present

## 2023-01-20 NOTE — Progress Notes (Signed)
Office: [phone]  /  Fax: [phone]  WEIGHT SUMMARY AND BIOMETRICS  Weight Lost Since Last Visit: 2lb  No data recorded  Vitals Temp: 97.7 F (36.5 C) BP: 125/78 Pulse Rate: 71 SpO2: 95 %   Anthropometric Measurements Height: 5\' 5"  (1.651 m) Weight: 194 lb (88 kg) BMI (Calculated): 32.28 Weight at Last Visit: 196lb Weight Lost Since Last Visit: 2lb Starting Weight: 211lb Total Weight Loss (lbs): 17 lb (7.711 kg)   Body Composition  Body Fat %: 47.1 % Fat Mass (lbs): 91.4 lbs Muscle Mass (lbs): 97.6 lbs Total Body Water (lbs): 74 lbs Visceral Fat Rating : 16   Other Clinical Data Fasting: No Labs: No Today's Visit #: 6 Starting Date: 10/26/22     HPI  Chief Complaint: OBESITY  Catherine Coleman is here to discuss her progress with her obesity treatment plan. She is on the the Category 2 Plan and states she is following her eating plan approximately 50 % of the time. She states she is exercising 0 minutes 0 days per week.   Interval History:  Since last office visit she has lost 2 lbs.  She is not skipping meals. She is aiming to eat protein with each meal.  She is drinking water, coffee and occ wine.  She is going to [place] May 22nd for one week to visit her family to celebrate her 89th birthday.     Pharmacotherapy for DMT2:  She is currently taking Ozempic 0.25mg .  Last A1c was 6.0 She is not checking BS at home.   Episodes of hypoglycemia: unsure On ARB, ASA 81mg  (occ-not on a regular basis) and statin.  Last eye exam:  2024-notes some changes in vision over the past 1-2 weeks-not had problems in the past.  Reports floaters and hard to read fine print.  Has eye exam scheduled 01/29/23.  Contacted the office and was able to get her an appt in the am.   She has tried Metformin in the past.   Lab Results  Component Value Date   HGBA1C 6.0 01/15/2023   HGBA1C 6.5 10/12/2022   HGBA1C 6.4 06/05/2022   Lab Results  Component Value Date   LDLCALC 51  01/15/2023   CREATININE 0.73 01/15/2023    Hypertension Hypertension stable.  Medication(s): Cozaar 50mg .   Denies chest pain, palpitations and SOB.  She reports she feels weird, unsettled. She doesn't feel steady. Denies falling or fainting.  Just doesn't feel like herself for the past 1-2 weeks.  Last labs 5-3/24  BP Readings from Last 3 Encounters:  01/20/23 125/78  01/18/23 116/65  12/30/22 123/82   Lab Results  Component Value Date   CREATININE 0.73 01/15/2023   CREATININE 0.77 10/12/2022   CREATININE 0.82 06/04/2022    PHYSICAL EXAM:  Blood pressure 125/78, pulse 71, temperature 97.7 F (36.5 C), height 5\' 5"  (1.651 m), weight 194 lb (88 kg), SpO2 95 %. Body mass index is 32.28 kg/m.  General: She is overweight, cooperative, alert, well developed, and in no acute distress. PSYCH: Has normal mood, affect and thought process.   Extremities: No edema.  Neurologic: No gross sensory or motor deficits. No tremors or fasciculations noted.    DIAGNOSTIC DATA REVIEWED:  BMET    Component Value Date/Time   NA 136 01/15/2023 0935   NA 137 09/28/2018 1147   K 4.5 01/15/2023 0935   CL 99 01/15/2023 0935   CO2 28 01/15/2023 0935   GLUCOSE 98 01/15/2023 0935   BUN 11 01/15/2023 0935  BUN 9 09/28/2018 1147   CREATININE 0.73 01/15/2023 0935   CREATININE 0.76 01/02/2022 1331   CALCIUM 9.0 01/15/2023 0935   GFRNONAA >60 03/02/2022 1307   GFRNONAA >60 01/02/2022 1331   GFRAA 94 09/28/2018 1147   Lab Results  Component Value Date   HGBA1C 6.0 01/15/2023   HGBA1C 6.4 10/18/2017   Lab Results  Component Value Date   INSULIN 7.9 10/26/2022   INSULIN 15.5 05/30/2018   Lab Results  Component Value Date   TSH 0.79 01/15/2023   CBC    Component Value Date/Time   WBC 5.5 01/15/2023 0935   RBC 4.21 01/15/2023 0935   HGB 13.6 01/15/2023 0935   HGB 13.0 01/02/2022 1331   HCT 40.1 01/15/2023 0935   PLT 236.0 01/15/2023 0935   PLT 197 01/02/2022 1331   MCV 95.2  01/15/2023 0935   MCH 32.3 03/02/2022 1307   MCHC 34.0 01/15/2023 0935   RDW 12.8 01/15/2023 0935   Iron Studies No results found for: "IRON", "TIBC", "FERRITIN", "IRONPCTSAT" Lipid Panel     Component Value Date/Time   CHOL 119 01/15/2023 0935   CHOL 226 (H) 09/28/2018 1147   TRIG 78.0 01/15/2023 0935   HDL 53.10 01/15/2023 0935   HDL 60 09/28/2018 1147   CHOLHDL 2 01/15/2023 0935   VLDL 15.6 01/15/2023 0935   LDLCALC 51 01/15/2023 0935   LDLCALC 134 (H) 09/28/2018 1147   LDLDIRECT 138.0 05/15/2019 1022   Hepatic Function Panel     Component Value Date/Time   PROT 6.5 01/15/2023 0935   PROT 6.7 09/28/2018 1147   ALBUMIN 4.1 01/15/2023 0935   ALBUMIN 4.6 09/28/2018 1147   AST 16 01/15/2023 0935   AST 20 01/02/2022 1331   ALT 13 01/15/2023 0935   ALT 19 01/02/2022 1331   ALKPHOS 91 01/15/2023 0935   BILITOT 0.5 01/15/2023 0935   BILITOT 0.3 01/02/2022 1331      Component Value Date/Time   TSH 0.79 01/15/2023 0935   Nutritional Lab Results  Component Value Date   VD25OH 62.50 01/15/2023   VD25OH 41.92 10/12/2022   VD25OH 34.14 06/04/2022     ASSESSMENT AND PLAN  TREATMENT PLAN FOR OBESITY:  Recommended Dietary Goals  Catherine Coleman is currently in the action stage of change. As such, her goal is to continue weight management plan. She has agreed to the Category 2 Plan.  Behavioral Intervention  We discussed the following Behavioral Modification Strategies today: increasing lean protein intake, decreasing simple carbohydrates , increasing vegetables, increasing lower glycemic fruits, increasing fiber rich foods, avoiding skipping meals, increasing water intake, continue to practice mindfulness when eating, and planning for success.  Additional resources provided today: NA  Recommended Physical Activity Goals  Catherine Coleman has been advised to work up to 150 minutes of moderate intensity aerobic activity a week and strengthening exercises 2-3 times per week for  cardiovascular health, weight loss maintenance and preservation of muscle mass.   She has agreed to Increase physical activity in their day and reduce sedentary time (increase NEAT).   ASSOCIATED CONDITIONS ADDRESSED TODAY  Action/Plan  Diabetes mellitus without complication (HCC) See below.    Essential hypertension See below   Generalized obesity  BMI 32.0-32.9,adult       Stop ozempic-to contact me tomorrow after her eye exam and in 1 week after that to let me know how she is doing.  Keep appt with ophthamalogy tomorrow.  Contact PCP and discuss above symptoms. Labs reviewed in chart from 01/15/23 with patient.  Consider starting Metformin especially if she feels better after stopping Ozempic.  Will continue to monitor how she is doing. Discussed the importance of eating 3 meals per day-making sure to meet calories and protein goals.  I've asked her to add an additional 100-200 calories daily.     Return in about 4 weeks (around 02/17/2023).Marland Kitchen She was informed of the importance of frequent follow up visits to maximize her success with intensive lifestyle modifications for her multiple health conditions.   ATTESTASTION STATEMENTS:  Reviewed by clinician on day of visit: allergies, medications, problem list, medical history, surgical history, family history, social history, and previous encounter notes.   Time spent on visit including pre-visit chart review and post-visit care and charting was 30 minutes.    Theodis Sato. Coden Franchi FNP-C

## 2023-01-21 ENCOUNTER — Telehealth: Payer: Medicare Other | Admitting: Family Medicine

## 2023-01-21 ENCOUNTER — Telehealth: Payer: Self-pay | Admitting: Nurse Practitioner

## 2023-01-21 DIAGNOSIS — H43813 Vitreous degeneration, bilateral: Secondary | ICD-10-CM | POA: Diagnosis not present

## 2023-01-21 DIAGNOSIS — H353122 Nonexudative age-related macular degeneration, left eye, intermediate dry stage: Secondary | ICD-10-CM | POA: Diagnosis not present

## 2023-01-21 DIAGNOSIS — H353211 Exudative age-related macular degeneration, right eye, with active choroidal neovascularization: Secondary | ICD-10-CM | POA: Diagnosis not present

## 2023-01-21 NOTE — Telephone Encounter (Signed)
Patient called wanting Judeth Cornfield to know that the hemmorage behind her Right Eye Retina was not caused by the Ozempic. Please call patient at [phone] if you have questions or concerns.

## 2023-01-25 DIAGNOSIS — H3561 Retinal hemorrhage, right eye: Secondary | ICD-10-CM | POA: Diagnosis not present

## 2023-01-25 DIAGNOSIS — H35723 Serous detachment of retinal pigment epithelium, bilateral: Secondary | ICD-10-CM | POA: Diagnosis not present

## 2023-01-25 DIAGNOSIS — H53411 Scotoma involving central area, right eye: Secondary | ICD-10-CM | POA: Diagnosis not present

## 2023-01-25 DIAGNOSIS — Z961 Presence of intraocular lens: Secondary | ICD-10-CM | POA: Diagnosis not present

## 2023-01-25 DIAGNOSIS — H35363 Drusen (degenerative) of macula, bilateral: Secondary | ICD-10-CM | POA: Diagnosis not present

## 2023-01-25 DIAGNOSIS — H35453 Secondary pigmentary degeneration, bilateral: Secondary | ICD-10-CM | POA: Diagnosis not present

## 2023-01-25 DIAGNOSIS — H353211 Exudative age-related macular degeneration, right eye, with active choroidal neovascularization: Secondary | ICD-10-CM | POA: Diagnosis not present

## 2023-01-25 DIAGNOSIS — H353122 Nonexudative age-related macular degeneration, left eye, intermediate dry stage: Secondary | ICD-10-CM | POA: Diagnosis not present

## 2023-01-27 DIAGNOSIS — H353211 Exudative age-related macular degeneration, right eye, with active choroidal neovascularization: Secondary | ICD-10-CM | POA: Diagnosis not present

## 2023-02-15 ENCOUNTER — Other Ambulatory Visit: Payer: Self-pay | Admitting: Pharmacist

## 2023-02-15 ENCOUNTER — Ambulatory Visit (INDEPENDENT_AMBULATORY_CARE_PROVIDER_SITE_OTHER): Payer: Medicare Other | Admitting: Bariatrics

## 2023-02-15 ENCOUNTER — Encounter: Payer: Self-pay | Admitting: Bariatrics

## 2023-02-15 VITALS — BP 120/74 | HR 68 | Temp 97.4°F | Ht 65.0 in | Wt 193.0 lb

## 2023-02-15 DIAGNOSIS — I10 Essential (primary) hypertension: Secondary | ICD-10-CM

## 2023-02-15 DIAGNOSIS — E1169 Type 2 diabetes mellitus with other specified complication: Secondary | ICD-10-CM | POA: Diagnosis not present

## 2023-02-15 DIAGNOSIS — E669 Obesity, unspecified: Secondary | ICD-10-CM | POA: Insufficient documentation

## 2023-02-15 DIAGNOSIS — Z6832 Body mass index (BMI) 32.0-32.9, adult: Secondary | ICD-10-CM

## 2023-02-15 DIAGNOSIS — E119 Type 2 diabetes mellitus without complications: Secondary | ICD-10-CM

## 2023-02-15 DIAGNOSIS — Z7984 Long term (current) use of oral hypoglycemic drugs: Secondary | ICD-10-CM

## 2023-02-15 DIAGNOSIS — Z7985 Long-term (current) use of injectable non-insulin antidiabetic drugs: Secondary | ICD-10-CM | POA: Diagnosis not present

## 2023-02-15 MED ORDER — OZEMPIC (0.25 OR 0.5 MG/DOSE) 2 MG/3ML ~~LOC~~ SOPN: PEN_INJECTOR | SUBCUTANEOUS | 0 refills | Status: DC

## 2023-02-15 MED ORDER — METFORMIN HCL 500 MG PO TABS: 500.0000 mg | ORAL_TABLET | Freq: Every day | ORAL | 0 refills | Status: DC

## 2023-02-15 NOTE — Progress Notes (Signed)
Pharmacy Quality Measure Review  This patient is appearing on the insurance-provided list for being at risk of failing the adherence measure for diabetes medications this calendar year.   Medication: Ozempic 0.25mg  weekly  Last fill date: 12/10/2022 (= 56 day supply)  Medication: metformin 500mg  Last fill date: 10/26/2022 -only filled once - stopped after Ozempic was started.   Patient states that she is only taking a low dose of Ozempic. She is thinking about stopping Ozempic due to cost. She is in Medicare coverage gap and cost has increased to about $200 per month.    Assessment:  Type 2 DM - A1c has been at goal Obesity  Plan:  Screened for medication assistance program for Ozempic - patient did not qualify - she was above income limit.  [facility]. She might be able to change back to metformin if she stopped Ozempic. Metformin would continue to control blood glucose and could help maintain weight she has lost.  Recommended she continue to follow low calorie diet per [facility].

## 2023-02-15 NOTE — Progress Notes (Unsigned)
Chief Complaint:   OBESITY Catherine Coleman is here to discuss her progress with her obesity treatment plan along with follow-up of her obesity related diagnoses. Catherine Coleman is on the Category 2 Plan and states she is following her eating plan approximately 0% of the time. Catherine Coleman states she is doing 0 minutes 0 times per week.  Today's visit was #: 7 Starting weight: 211 lbs Starting date: 10/26/2022 Today's weight: 193 lbs Today's date: 02/15/2023 Total lbs lost to date: 18 Total lbs lost since last in-office visit: 1  Interim History: Patient is down 1 pound since her last visit.  She has been out of town.  Her body fat has decreased and her muscle mass has increased, and her fat mass is down.  Subjective:   1. Type 2 diabetes mellitus with obesity (HCC) Patient was prescribed Ozempic but she is in the donut hole.  2. Essential hypertension Patient is taking losartan, and her blood pressure is controlled.  Assessment/Plan:   1. Type 2 diabetes mellitus with obesity (HCC) Patient will work on decreasing carbohydrates (starches and sweets).  We will refill Ozempic 0.25 mg for 1 month and metformin 500 mg for 90 days.  - Semaglutide,0.25 or 0.5MG /DOS, (OZEMPIC, 0.25 OR 0.5 MG/DOSE,) 2 MG/3ML SOPN; Will inject 0.25 mg into the skin weekly.  Dispense: 3 mL; Refill: 0 - metFORMIN (GLUCOPHAGE) 500 MG tablet; Take 1 tablet (500 mg total) by mouth daily after lunch.  Dispense: 90 tablet; Refill: 0  2. Essential hypertension Patient will continue her medications as directed.  3. Generalized obesity  4. BMI 32.0-32.9,adult Catherine Coleman is currently in the action stage of change. As such, her goal is to continue with weight loss efforts. She has agreed to the Category 2 Plan.   Meal planning and intentional eating were discussed.  Exercise goals: No exercise has been prescribed at this time.  Behavioral modification strategies: increasing lean protein intake, decreasing simple carbohydrates,  increasing vegetables, increasing water intake, decreasing eating out, no skipping meals, meal planning and cooking strategies, keeping healthy foods in the home, and planning for success.  Catherine Coleman has agreed to follow-up with our clinic in 3 weeks. She was informed of the importance of frequent follow-up visits to maximize her success with intensive lifestyle modifications for her multiple health conditions.   Objective:   Blood pressure 120/74, pulse 68, temperature (!) 97.4 F (36.3 C), height 5\' 5"  (1.651 m), weight 193 lb (87.5 kg), SpO2 92 %. Body mass index is 32.12 kg/m.  General: Cooperative, alert, well developed, in no acute distress. HEENT: Conjunctivae and lids unremarkable. Cardiovascular: Regular rhythm.  Lungs: Normal work of breathing. Neurologic: No focal deficits.   Lab Results  Component Value Date   CREATININE 0.73 01/15/2023   BUN 11 01/15/2023   NA 136 01/15/2023   K 4.5 01/15/2023   CL 99 01/15/2023   CO2 28 01/15/2023   Lab Results  Component Value Date   ALT 13 01/15/2023   AST 16 01/15/2023   ALKPHOS 91 01/15/2023   BILITOT 0.5 01/15/2023   Lab Results  Component Value Date   HGBA1C 6.0 01/15/2023   HGBA1C 6.5 10/12/2022   HGBA1C 6.4 06/05/2022   HGBA1C 6.4 02/05/2022   HGBA1C 6.7 (H) 07/22/2021   Lab Results  Component Value Date   INSULIN 7.9 10/26/2022   INSULIN 11.5 06/12/2019   INSULIN 9.8 09/28/2018   INSULIN 15.5 05/30/2018   Lab Results  Component Value Date   TSH 0.79 01/15/2023  Lab Results  Component Value Date   CHOL 119 01/15/2023   HDL 53.10 01/15/2023   LDLCALC 51 01/15/2023   LDLDIRECT 138.0 05/15/2019   TRIG 78.0 01/15/2023   CHOLHDL 2 01/15/2023   Lab Results  Component Value Date   VD25OH 62.50 01/15/2023   VD25OH 41.92 10/12/2022   VD25OH 34.14 06/04/2022   Lab Results  Component Value Date   WBC 5.5 01/15/2023   HGB 13.6 01/15/2023   HCT 40.1 01/15/2023   MCV 95.2 01/15/2023   PLT 236.0  01/15/2023   No results found for: "IRON", "TIBC", "FERRITIN"  Attestation Statements:   Reviewed by clinician on day of visit: allergies, medications, problem list, medical history, surgical history, family history, social history, and previous encounter notes.   Trude Mcburney, am acting as Energy manager for [employer], DO.  I have reviewed the above documentation for accuracy and completeness, and I agree with the above. Corinna Capra, DO

## 2023-02-17 NOTE — Assessment & Plan Note (Signed)
Encouraged DASH or MIND diet, decrease po intake and increase exercise as tolerated. Needs 7-8 hours of sleep nightly. Avoid trans fats, eat small, frequent meals every 4-5 hours with lean proteins, complex carbs and healthy fats. Minimize simple carbs, high fat foods and processed foods 

## 2023-02-17 NOTE — Assessment & Plan Note (Signed)
On Levothyroxine, continue to monitor 

## 2023-02-17 NOTE — Assessment & Plan Note (Signed)
Well controlled, no changes to meds. Encouraged heart healthy diet such as the DASH diet and exercise as tolerated.  °

## 2023-02-17 NOTE — Assessment & Plan Note (Signed)
Encourage heart healthy diet such as MIND or DASH diet, increase exercise, avoid trans fats, simple carbohydrates and processed foods, consider a krill or fish or flaxseed oil cap daily. Tolerating Rosuvastatin. hgba1c acceptable, minimize simple carbs. Increase exercise as tolerated.

## 2023-02-17 NOTE — Assessment & Plan Note (Signed)
Supplement and monitor 

## 2023-02-18 ENCOUNTER — Ambulatory Visit (INDEPENDENT_AMBULATORY_CARE_PROVIDER_SITE_OTHER): Payer: Medicare Other | Admitting: Family Medicine

## 2023-02-18 VITALS — BP 120/72 | HR 72 | Temp 97.8°F | Resp 16 | Ht 65.0 in | Wt 197.4 lb

## 2023-02-18 DIAGNOSIS — E559 Vitamin D deficiency, unspecified: Secondary | ICD-10-CM

## 2023-02-18 DIAGNOSIS — E669 Obesity, unspecified: Secondary | ICD-10-CM

## 2023-02-18 DIAGNOSIS — Z7984 Long term (current) use of oral hypoglycemic drugs: Secondary | ICD-10-CM | POA: Diagnosis not present

## 2023-02-18 DIAGNOSIS — E038 Other specified hypothyroidism: Secondary | ICD-10-CM

## 2023-02-18 DIAGNOSIS — Z7985 Long-term (current) use of injectable non-insulin antidiabetic drugs: Secondary | ICD-10-CM

## 2023-02-18 DIAGNOSIS — E785 Hyperlipidemia, unspecified: Secondary | ICD-10-CM

## 2023-02-18 DIAGNOSIS — H353290 Exudative age-related macular degeneration, unspecified eye, stage unspecified: Secondary | ICD-10-CM | POA: Insufficient documentation

## 2023-02-18 DIAGNOSIS — I10 Essential (primary) hypertension: Secondary | ICD-10-CM

## 2023-02-18 DIAGNOSIS — E1169 Type 2 diabetes mellitus with other specified complication: Secondary | ICD-10-CM

## 2023-02-18 NOTE — Patient Instructions (Signed)

## 2023-02-18 NOTE — Assessment & Plan Note (Signed)
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

## 2023-02-18 NOTE — Progress Notes (Signed)
Subjective:   By signing my name below, I, Vickey Sages, attest that this documentation has been prepared under the direction and in the presence of Bradd Canary, MD., 02/18/2023.   Patient ID: Catherine Coleman, female    DOB: Mar 15, 1932, 89 y.o.   MRN: [id number]  Chief Complaint  Patient presents with   Follow-up    Follow up   HPI Patient is in today for an office visit. She denies recent hospitalization, febrile illness, CP/palpitations/SOB/HA/fever/chills/GI or GU symptoms.  Type 2 Diabetes Mellitus Patient is concerned about her recently diagnosed type 2 diabetes mellitus. Her HGBA1C was last checked on 01/15/2023 at 6.0%. Since her diagnosis, she has been maintaining a healthy diet and exercising, which she states have been helpful. Her body mass index is 32.85 kg/m. Wt Readings from Last 3 Encounters:  02/18/23 197 lb 6.4 oz (89.5 kg)  02/15/23 193 lb (87.5 kg)  01/20/23 194 lb (88 kg)   Lab Results  Component Value Date   HGBA1C 6.0 01/15/2023   Wet AMD (right eye) Patient follows with ophthalmologist Dr. 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 She is currently receiving monthly injections in the right eye at [facility] which have been tolerable. Today she denies any eye symptoms.  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   Constipation    Decreased hearing    Diverticulitis    Dry eyes    Fatigue    Floaters in visual field    Gall bladder disease    GERD (gastroesophageal reflux disease)    Glaucoma    Hiatal hernia with gastroesophageal reflux    History of blood transfusion    for Knee replacement   Hyperlipidemia, mild 04/02/2016   Hypertension    Hypothyroidism    Joint pain    Migraine aura without headache    Muscle pain    Muscle stiffness    Nasal congestion    Obesity 04/02/2016    Osteoarthritis    Parotiditis 04/21/2016   Pre-diabetes    Vitamin D deficiency 04/02/2016    Past Surgical History:  Procedure Laterality Date   APPENDECTOMY     BREAST LUMPECTOMY WITH RADIOACTIVE SEED LOCALIZATION Left 01/21/2021   Procedure: LEFT BREAST LUMPECTOMY WITH RADIOACTIVE SEED LOCALIZATION;  Surgeon: Abigail Miyamoto, MD;  Location: [facility];  Service: General;  Laterality: Left;   cataract surgery  2008   CHOLECYSTECTOMY  2010   JOINT REPLACEMENT Left    2012   MASTOIDECTOMY     POSTERIOR CERVICAL FUSION/FORAMINOTOMY N/A 04/27/2017   Procedure: CERVICAL FIVE-SIX  OPEN REDUCTION OF FRACTURE, CERVICAL THREE-SEVEN  DORSAL FIXATION AND FUSION;  Surgeon: Ditty, Loura Halt, MD;  Location: [facility] OR;  Service: Neurosurgery;  Laterality: N/A;   SHOULDER ARTHROSCOPY Bilateral prior to 2010   TONSILLECTOMY     TOTAL HIP ARTHROPLASTY Right 03/22/2015   Procedure: RIGHT TOTAL HIP ARTHROPLASTY ANTERIOR APPROACH;  Surgeon: Jodi Geralds, MD;  Location: [facility] OR;  Service: Orthopedics;  Laterality: Right;   TOTAL KNEE ARTHROPLASTY Bilateral 2007   TUBAL LIGATION      Family History  Problem Relation Age of Onset   Dementia Mother    Hypertension Mother    Obesity Mother    Diabetes Mother    Heart disease Father    Hypertension Father  Stroke Father    Diabetes Father    Hyperlipidemia Father    Alcoholism Father    Obesity Father    Cancer Maternal Grandfather        colon cancer   Arthritis Daughter     Social History   Socioeconomic History   Marital status: Widowed    Spouse name: Not on file   Number of children: Not on file   Years of education: Not on file   Highest education level: Not on file  Occupational History   Occupation: Retired  Tobacco Use   Smoking status: Never   Smokeless tobacco: Never   Tobacco comments:    Stopped 50 years ago.   Vaping Use   Vaping Use: Never used  Substance and Sexual Activity   Alcohol use: Yes     Alcohol/week: 0.0 standard drinks of alcohol    Comment: socially 3-4x/wk   Drug use: No   Sexual activity: Not Currently  Other Topics Concern   Not on file  Social History Narrative   Lives at [facility], widowed 7 years, no dietary restrictions. Retired from neuropsychiatry work.    Social Determinants of Health   Financial Resource Strain: Low Risk  (10/21/2022)   Overall Financial Resource Strain (CARDIA)    Difficulty of Paying Living Expenses: Not hard at all  Food Insecurity: No Food Insecurity (10/21/2022)   Hunger Vital Sign    Worried About Running Out of Food in the Last Year: Never true    Ran Out of Food in the Last Year: Never true  Transportation Needs: No Transportation Needs (10/21/2022)   PRAPARE - Administrator, Civil Service (Medical): No    Lack of Transportation (Non-Medical): No  Physical Activity: Sufficiently Active (10/21/2022)   Exercise Vital Sign    Days of Exercise per Week: 3 days    Minutes of Exercise per Session: 60 min  Stress: No Stress Concern Present (10/21/2022)   [employer] of Occupational Health - Occupational Stress Questionnaire    Feeling of Stress : Not at all  Social Connections: Moderately Isolated (10/21/2022)   Social Connection and Isolation Panel [NHANES]    Frequency of Communication with Friends and Family: More than three times a week    Frequency of Social Gatherings with Friends and Family: More than three times a week    Attends Religious Services: Never    Database administrator or Organizations: Yes    Attends Banker Meetings: 1 to 4 times per year    Marital Status: Widowed  Intimate Partner Violence: Not At Risk (10/21/2022)   Humiliation, Afraid, Rape, and Kick questionnaire    Fear of Current or Ex-Partner: No    Emotionally Abused: No    Physically Abused: No    Sexually Abused: No    Outpatient Medications Prior to Visit  Medication Sig Dispense Refill   anastrozole (ARIMIDEX) 1 MG  tablet Take 1 tablet (1 mg total) by mouth daily. Start 04/28/2021 90 tablet 4   Biotin 91478 MCG TABS Take 1 tablet by mouth daily.     brimonidine (ALPHAGAN) 0.2 % ophthalmic solution Place 1 drop into both eyes 2 (two) times daily. 5 mL 12   Calcium Carb-Cholecalciferol 600-10 MG-MCG TABS Take 1 tablet by mouth daily.     cephALEXin (KEFLEX) 500 MG capsule Take 1 capsule (500 mg total) by mouth 4 (four) times daily. 40 capsule 0   COVID-19 mRNA bivalent vaccine, Moderna, (MODERNA COVID-19  BIVAL BOOSTER) 50 MCG/0.5ML injection Inject into the muscle. 0.5 mL 0   COVID-19 mRNA vaccine 2023-2024 (COMIRNATY) SUSP injection Inject into the muscle. 0.3 mL 0   COVID-19 mRNA vaccine 2023-2024 (COMIRNATY) syringe Inject into the muscle. 0.3 mL 0   famotidine (PEPCID) 40 MG tablet TAKE 1 TABLET BY MOUTH AT  BEDTIME AS NEEDED 100 tablet 2   Insulin Pen Needle (NOVOFINE PLUS PEN NEEDLE) 32G X 4 MM MISC Use as directed with Ozempic 30 each 0   levothyroxine (SYNTHROID) 125 MCG tablet TAKE 1 TABLET BY MOUTH DAILY  BEFORE BREAKFAST 100 tablet 2   losartan (COZAAR) 50 MG tablet TAKE 1 TABLET BY MOUTH DAILY 100 tablet 2   MAGNESIUM-POTASSIUM PO Take 1 tablet by mouth daily.     metFORMIN (GLUCOPHAGE) 500 MG tablet Take 1 tablet (500 mg total) by mouth daily after lunch. 90 tablet 0   Multiple Vitamins-Minerals (MULTIVITAMIN WITH MINERALS) tablet Take 1 tablet by mouth daily.     Multiple Vitamins-Minerals (PRESERVISION AREDS PO) Take 1 tablet by mouth daily.     MYRBETRIQ 50 MG TB24 tablet TAKE 1 TABLET BY MOUTH DAILY 100 tablet 2   omeprazole (PRILOSEC) 20 MG capsule TAKE 1 CAPSULE BY MOUTH DAILY 100 capsule 2   polyethylene glycol (MIRALAX / GLYCOLAX) packet Take 17 g by mouth daily. (Patient taking differently: Take 17 g by mouth daily as needed.) 14 each 0   rosuvastatin (CRESTOR) 5 MG tablet TAKE 1 TABLET BY MOUTH DAILY 100 tablet 2   RSV vaccine recomb adjuvanted (AREXVY) 120 MCG/0.5ML injection Inject  into the muscle. 1 each 0   Semaglutide,0.25 or 0.5MG /DOS, (OZEMPIC, 0.25 OR 0.5 MG/DOSE,) 2 MG/3ML SOPN Will inject 0.25 mg into the skin weekly. 3 mL 0   Vitamin D, Cholecalciferol, 25 MCG (1000 UT) CAPS Take 1 capsule by mouth daily.     No facility-administered medications prior to visit.    No Known Allergies  Review of Systems  Constitutional:  Negative for chills and fever.  Eyes:  Negative for blurred vision, double vision, photophobia and discharge.  Respiratory:  Negative for shortness of breath.   Cardiovascular:  Negative for chest pain and palpitations.  Gastrointestinal:  Negative for abdominal pain, blood in stool, constipation, diarrhea, nausea and vomiting.  Genitourinary:  Negative for dysuria, frequency, hematuria and urgency.  Skin:           Neurological:  Negative for headaches.      Objective:    Physical Exam Constitutional:      General: She is not in acute distress.    Appearance: Normal appearance. She is obese. She is not ill-appearing.  HENT:     Head: Normocephalic and atraumatic.     Right Ear: External ear normal.     Left Ear: External ear normal.     Nose: Nose normal.     Mouth/Throat:     Mouth: Mucous membranes are moist.     Pharynx: Oropharynx is clear.  Eyes:     General:        Right eye: No discharge.        Left eye: No discharge.     Extraocular Movements: Extraocular movements intact.     Conjunctiva/sclera: Conjunctivae normal.     Pupils: Pupils are equal, round, and reactive to light.  Cardiovascular:     Rate and Rhythm: Normal rate and regular rhythm.     Pulses: Normal pulses.     Heart sounds: Normal heart sounds.  No murmur heard.    No gallop.  Pulmonary:     Effort: Pulmonary effort is normal. No respiratory distress.     Breath sounds: Normal breath sounds. No wheezing or rales.  Abdominal:     General: Bowel sounds are normal.     Palpations: Abdomen is soft.     Tenderness: There is no abdominal tenderness.  There is no guarding.  Musculoskeletal:        General: Normal range of motion.     Cervical back: Normal range of motion.     Right lower leg: No edema.     Left lower leg: No edema.  Feet:     Right foot:     Protective Sensation: 8 sites tested.  8 sites sensed.     Skin integrity: Skin integrity normal.     Toenail Condition: Right toenails are abnormally thick and long.     Left foot:     Protective Sensation: 8 sites tested.  8 sites sensed.     Skin integrity: Skin integrity normal.     Comments: Thickening of the second toenail on right foot. Skin:    General: Skin is warm and dry.  Neurological:     Mental Status: She is alert and oriented to person, place, and time.  Psychiatric:        Mood and Affect: Mood normal.        Behavior: Behavior normal.        Judgment: Judgment normal.     BP 120/72 (BP Location: Left Arm, Patient Position: Sitting, Cuff Size: Normal)   Pulse 72   Temp 97.8 F (36.6 C) (Oral)   Resp 16   Ht 5\' 5"  (1.651 m)   Wt 197 lb 6.4 oz (89.5 kg)   SpO2 95%   BMI 32.85 kg/m  Wt Readings from Last 3 Encounters:  02/18/23 197 lb 6.4 oz (89.5 kg)  02/15/23 193 lb (87.5 kg)  01/20/23 194 lb (88 kg)    Diabetic Foot Exam - Simple   Simple Foot Form Diabetic Foot exam was performed with the following findings: Yes 02/18/2023 11:45 AM  Visual Inspection No deformities, no ulcerations, no other skin breakdown bilaterally: Yes Sensation Testing Intact to touch and monofilament testing bilaterally: Yes Pulse Check Posterior Tibialis and Dorsalis pulse intact bilaterally: Yes Comments Thick toenail 2nd toenail on right    Lab Results  Component Value Date   WBC 5.5 01/15/2023   HGB 13.6 01/15/2023   HCT 40.1 01/15/2023   PLT 236.0 01/15/2023   GLUCOSE 98 01/15/2023   CHOL 119 01/15/2023   TRIG 78.0 01/15/2023   HDL 53.10 01/15/2023   LDLDIRECT 138.0 05/15/2019   LDLCALC 51 01/15/2023   ALT 13 01/15/2023   AST 16 01/15/2023   NA 136  01/15/2023   K 4.5 01/15/2023   CL 99 01/15/2023   CREATININE 0.73 01/15/2023   BUN 11 01/15/2023   CO2 28 01/15/2023   TSH 0.79 01/15/2023   INR 1.05 04/28/2017   HGBA1C 6.0 01/15/2023    Lab Results  Component Value Date   TSH 0.79 01/15/2023   Lab Results  Component Value Date   WBC 5.5 01/15/2023   HGB 13.6 01/15/2023   HCT 40.1 01/15/2023   MCV 95.2 01/15/2023   PLT 236.0 01/15/2023   Lab Results  Component Value Date   NA 136 01/15/2023   K 4.5 01/15/2023   CO2 28 01/15/2023   GLUCOSE 98 01/15/2023   BUN  11 01/15/2023   CREATININE 0.73 01/15/2023   BILITOT 0.5 01/15/2023   ALKPHOS 91 01/15/2023   AST 16 01/15/2023   ALT 13 01/15/2023   PROT 6.5 01/15/2023   ALBUMIN 4.1 01/15/2023   CALCIUM 9.0 01/15/2023   ANIONGAP 11 03/02/2022   GFR 73.17 01/15/2023   Lab Results  Component Value Date   CHOL 119 01/15/2023   Lab Results  Component Value Date   HDL 53.10 01/15/2023   Lab Results  Component Value Date   LDLCALC 51 01/15/2023   Lab Results  Component Value Date   TRIG 78.0 01/15/2023   Lab Results  Component Value Date   CHOLHDL 2 01/15/2023   Lab Results  Component Value Date   HGBA1C 6.0 01/15/2023      Assessment & Plan:  Hyperlipidemia: This is controlled with Rosuvastatin 5 mg daily. There were no medication adjustments today.  Hypertension: This is controlled with Losartan 50 mg daily. There were no medication adjustments today.  Hypothyroidism: This is controlled with Levothyroxine 125 mg daily. There were no medication adjustments today.  Labs: Routine blood work ordered.  Type 2 Diabetes Mellitus: Encouraged 6-8 hours of sleep, heart healthy diet, 60-80 oz of non-alcohol/non-caffeinated fluids, and 4000-8000 steps daily. Glucometer and lancets prescribed today. Problem List Items Addressed This Visit     Hypertension - Primary    Well controlled, no changes to meds. Encouraged heart healthy diet such as the DASH diet and  exercise as tolerated.       Relevant Orders   Comprehensive metabolic panel   CBC with Differential/Platelet   Hypothyroidism    On Levothyroxine, continue to monitor      Relevant Orders   TSH   Morbid obesity (HCC)    . Encouraged DASH or MIND diet, decrease po intake and increase exercise as tolerated. Needs 7-8 hours of sleep nightly. Avoid trans fats, eat small, frequent meals every 4-5 hours with lean proteins, complex carbs and healthy fats. Minimize simple carbs, high fat foods and processed foods       Vitamin D deficiency    Supplement and monitor       Relevant Orders   Vitamin D 1,25 dihydroxy   Hyperlipidemia associated with type 2 diabetes mellitus (HCC)    Encourage heart healthy diet such as MIND or DASH diet, increase exercise, avoid trans fats, simple carbohydrates and processed foods, consider a krill or fish or flaxseed oil cap daily. Tolerating Rosuvastatin. hgba1c acceptable, minimize simple carbs. Increase exercise as tolerated.      Relevant Orders   Lipid panel   Type 2 diabetes mellitus with obesity (HCC)    hgba1c acceptable, minimize simple carbs. Increase exercise as tolerated. 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  I, Danise Edge, MD, personally preformed the services described in this documentation.  All medical record entries made by the scribe were at my direction and in my presence.  I have reviewed the chart and discharge instructions (if applicable) and agree that the record reflects my personal performance and is accurate and complete. 02/18/2023  I,Mohammed Iqbal,acting as a  scribe for Danise Edge, MD.,have  documented all relevant documentation on the behalf of Danise Edge, MD,as directed by  Danise Edge, MD while in the presence of Danise Edge, MD.  Danise Edge, MD

## 2023-02-22 ENCOUNTER — Encounter: Payer: Self-pay | Admitting: Family Medicine

## 2023-02-22 NOTE — Assessment & Plan Note (Signed)
hgba1c acceptable, minimize simple carbs. Increase exercise as tolerated. Set up with glucometer, lancets and test strips to check sugars daily to weekly and prn

## 2023-03-03 DIAGNOSIS — H353211 Exudative age-related macular degeneration, right eye, with active choroidal neovascularization: Secondary | ICD-10-CM | POA: Diagnosis not present

## 2023-03-08 ENCOUNTER — Other Ambulatory Visit: Payer: Self-pay | Admitting: Nurse Practitioner

## 2023-03-08 ENCOUNTER — Telehealth (INDEPENDENT_AMBULATORY_CARE_PROVIDER_SITE_OTHER): Payer: Self-pay | Admitting: Nurse Practitioner

## 2023-03-08 DIAGNOSIS — E669 Obesity, unspecified: Secondary | ICD-10-CM

## 2023-03-08 NOTE — Telephone Encounter (Signed)
Medication refilled per Judeth Cornfield.

## 2023-03-08 NOTE — Telephone Encounter (Signed)
6/24 patient is currently out of Ozempic. Patient would like a CMA to call her back. [name]

## 2023-03-12 ENCOUNTER — Other Ambulatory Visit: Payer: Self-pay | Admitting: Family Medicine

## 2023-03-15 ENCOUNTER — Encounter: Payer: Self-pay | Admitting: Nurse Practitioner

## 2023-03-15 ENCOUNTER — Ambulatory Visit (INDEPENDENT_AMBULATORY_CARE_PROVIDER_SITE_OTHER): Payer: Medicare Other | Admitting: Nurse Practitioner

## 2023-03-15 VITALS — BP 121/76 | HR 72 | Temp 97.8°F | Ht 65.0 in | Wt 191.0 lb

## 2023-03-15 DIAGNOSIS — Z7985 Long-term (current) use of injectable non-insulin antidiabetic drugs: Secondary | ICD-10-CM

## 2023-03-15 DIAGNOSIS — E1169 Type 2 diabetes mellitus with other specified complication: Secondary | ICD-10-CM | POA: Diagnosis not present

## 2023-03-15 DIAGNOSIS — E669 Obesity, unspecified: Secondary | ICD-10-CM

## 2023-03-15 DIAGNOSIS — Z6831 Body mass index (BMI) 31.0-31.9, adult: Secondary | ICD-10-CM | POA: Diagnosis not present

## 2023-03-15 NOTE — Progress Notes (Signed)
Office: [phone]  /  Fax: [phone]  WEIGHT SUMMARY AND BIOMETRICS  Weight Lost Since Last Visit: 2lb  No data recorded  Vitals Temp: 97.8 F (36.6 C) BP: 121/76 Pulse Rate: 72 SpO2: 95 %   Anthropometric Measurements Height: 5\' 5"  (1.651 m) Weight: 191 lb (86.6 kg) BMI (Calculated): 31.78 Weight at Last Visit: 193lb Weight Lost Since Last Visit: 2lb Starting Weight: 211lb Total Weight Loss (lbs): 20 lb (9.072 kg)   Body Composition  Body Fat %: 46.6 % Fat Mass (lbs): 89 lbs Muscle Mass (lbs): 96.8 lbs Total Body Water (lbs): 73.6 lbs Visceral Fat Rating : 16   Other Clinical Data Fasting: No Labs: No Today's Visit #: 8 Starting Date: 10/26/22     HPI  Chief Complaint: OBESITY  Catherine Coleman is here to discuss her progress with her obesity treatment plan. She is on the the Category 2 Plan and states she is following her eating plan approximately 50 % of the time. She states she is exercising 60 minutes 3 days per week.   Interval History:  Since last office visit she has lost 2 pounds.  Goal weight:  185 lbs.  She has been struggling with reflux and some nausea and vomiting for the past 3 days.  Several people where she lives have been having problems with nausea and vomiting this week.  Overall she feels better then she has in years.  Doing well and doesn't want to make any changes at this time.      She is going on vacation this month and next month.    Pharmacotherapy for DMT2:  She is currently taking Ozempic 0.5mg .  She is not currently taking Metformin.  Denies side effects.   Last A1c was 6.0 She is not checking BS at home.   Episodes of hypoglycemia: no On ARB, ASA 81mg  and statin.  Last eye exam:  2024 She has tried Metformin in the past.  Not currently taking.    Lab Results  Component Value Date   HGBA1C 6.0 01/15/2023   HGBA1C 6.5 10/12/2022   HGBA1C 6.4 06/05/2022   Lab Results  Component Value Date   LDLCALC 51 01/15/2023    CREATININE 0.73 01/15/2023      PHYSICAL EXAM:  Blood pressure 121/76, pulse 72, temperature 97.8 F (36.6 C), height 5\' 5"  (1.651 m), weight 191 lb (86.6 kg), SpO2 95 %. Body mass index is 31.78 kg/m.  General: She is overweight, cooperative, alert, well developed, and in no acute distress. PSYCH: Has normal mood, affect and thought process.   Extremities: No edema.  Neurologic: No gross sensory or motor deficits. No tremors or fasciculations noted.    DIAGNOSTIC DATA REVIEWED:  BMET    Component Value Date/Time   NA 136 01/15/2023 0935   NA 137 09/28/2018 1147   K 4.5 01/15/2023 0935   CL 99 01/15/2023 0935   CO2 28 01/15/2023 0935   GLUCOSE 98 01/15/2023 0935   BUN 11 01/15/2023 0935   BUN 9 09/28/2018 1147   CREATININE 0.73 01/15/2023 0935   CREATININE 0.76 01/02/2022 1331   CALCIUM 9.0 01/15/2023 0935   GFRNONAA >60 03/02/2022 1307   GFRNONAA >60 01/02/2022 1331   GFRAA 94 09/28/2018 1147   Lab Results  Component Value Date   HGBA1C 6.0 01/15/2023   HGBA1C 6.4 10/18/2017   Lab Results  Component Value Date   INSULIN 7.9 10/26/2022   INSULIN 15.5 05/30/2018   Lab Results  Component Value Date  TSH 0.79 01/15/2023   CBC    Component Value Date/Time   WBC 5.5 01/15/2023 0935   RBC 4.21 01/15/2023 0935   HGB 13.6 01/15/2023 0935   HGB 13.0 01/02/2022 1331   HCT 40.1 01/15/2023 0935   PLT 236.0 01/15/2023 0935   PLT 197 01/02/2022 1331   MCV 95.2 01/15/2023 0935   MCH 32.3 03/02/2022 1307   MCHC 34.0 01/15/2023 0935   RDW 12.8 01/15/2023 0935   Iron Studies No results found for: "IRON", "TIBC", "FERRITIN", "IRONPCTSAT" Lipid Panel     Component Value Date/Time   CHOL 119 01/15/2023 0935   CHOL 226 (H) 09/28/2018 1147   TRIG 78.0 01/15/2023 0935   HDL 53.10 01/15/2023 0935   HDL 60 09/28/2018 1147   CHOLHDL 2 01/15/2023 0935   VLDL 15.6 01/15/2023 0935   LDLCALC 51 01/15/2023 0935   LDLCALC 134 (H) 09/28/2018 1147   LDLDIRECT 138.0  05/15/2019 1022   Hepatic Function Panel     Component Value Date/Time   PROT 6.5 01/15/2023 0935   PROT 6.7 09/28/2018 1147   ALBUMIN 4.1 01/15/2023 0935   ALBUMIN 4.6 09/28/2018 1147   AST 16 01/15/2023 0935   AST 20 01/02/2022 1331   ALT 13 01/15/2023 0935   ALT 19 01/02/2022 1331   ALKPHOS 91 01/15/2023 0935   BILITOT 0.5 01/15/2023 0935   BILITOT 0.3 01/02/2022 1331      Component Value Date/Time   TSH 0.79 01/15/2023 0935   Nutritional Lab Results  Component Value Date   VD25OH 62.50 01/15/2023   VD25OH 41.92 10/12/2022   VD25OH 34.14 06/04/2022     ASSESSMENT AND PLAN  TREATMENT PLAN FOR OBESITY:  Recommended Dietary Goals  Catherine Coleman is currently in the action stage of change. As such, her goal is to continue weight management plan. She has agreed to the Category 2 Plan.  Behavioral Intervention  We discussed the following Behavioral Modification Strategies today: increasing lean protein intake, decreasing simple carbohydrates , increasing vegetables, increasing lower glycemic fruits, increasing fiber rich foods, avoiding skipping meals, increasing water intake, keeping healthy foods at home, continue to practice mindfulness when eating, and planning for success.  Additional resources provided today: NA  Recommended Physical Activity Goals  Jazline has been advised to work up to 150 minutes of moderate intensity aerobic activity a week and strengthening exercises 2-3 times per week for cardiovascular health, weight loss maintenance and preservation of muscle mass.   She has agreed to Continue current level of physical activity    ASSOCIATED CONDITIONS ADDRESSED TODAY  Action/Plan  Type 2 diabetes mellitus with obesity (HCC) Continue Ozempic 0.5mg  (took dose this am).  I've asked the patient to contact me in the next 1-2 days to let me know how she is doing.  If she continues to have nausea and vomiting to let me know.  May need to hold Ozempic dose next  week.  She is not currently taking Metformin.  Not to restart at this time.    Good blood sugar control is important to decrease the likelihood of diabetic complications such as nephropathy, neuropathy, limb loss, blindness, coronary artery disease, and death. Intensive lifestyle modification including diet, exercise and weight loss are the first line of treatment for diabetes.    Morbid obesity (HCC)  BMI 31.0-31.9,adult      Last labs 01/15/23   Return in about 4 weeks (around 04/12/2023).Marland Kitchen She was informed of the importance of frequent follow up visits to maximize her success  with intensive lifestyle modifications for her multiple health conditions.   ATTESTASTION STATEMENTS:  Reviewed by clinician on day of visit: allergies, medications, problem list, medical history, surgical history, family history, social history, and previous encounter notes.   Time spent on visit including pre-visit chart review and post-visit care and charting was 30 minutes.    Theodis Sato. Jimma Ortman FNP-C

## 2023-04-07 DIAGNOSIS — H353211 Exudative age-related macular degeneration, right eye, with active choroidal neovascularization: Secondary | ICD-10-CM | POA: Diagnosis not present

## 2023-04-08 DIAGNOSIS — H401412 Capsular glaucoma with pseudoexfoliation of lens, right eye, moderate stage: Secondary | ICD-10-CM | POA: Diagnosis not present

## 2023-04-08 DIAGNOSIS — H401421 Capsular glaucoma with pseudoexfoliation of lens, left eye, mild stage: Secondary | ICD-10-CM | POA: Diagnosis not present

## 2023-04-15 ENCOUNTER — Ambulatory Visit: Payer: Medicare Other | Admitting: Nurse Practitioner

## 2023-04-18 ENCOUNTER — Other Ambulatory Visit: Payer: Self-pay | Admitting: Bariatrics

## 2023-04-18 DIAGNOSIS — E1169 Type 2 diabetes mellitus with other specified complication: Secondary | ICD-10-CM

## 2023-04-20 ENCOUNTER — Encounter: Payer: Self-pay | Admitting: Bariatrics

## 2023-04-20 ENCOUNTER — Ambulatory Visit (INDEPENDENT_AMBULATORY_CARE_PROVIDER_SITE_OTHER): Payer: Medicare Other | Admitting: Bariatrics

## 2023-04-20 DIAGNOSIS — Z7985 Long-term (current) use of injectable non-insulin antidiabetic drugs: Secondary | ICD-10-CM

## 2023-04-20 DIAGNOSIS — Z6831 Body mass index (BMI) 31.0-31.9, adult: Secondary | ICD-10-CM | POA: Diagnosis not present

## 2023-04-20 DIAGNOSIS — E669 Obesity, unspecified: Secondary | ICD-10-CM | POA: Diagnosis not present

## 2023-04-20 DIAGNOSIS — E1169 Type 2 diabetes mellitus with other specified complication: Secondary | ICD-10-CM | POA: Diagnosis not present

## 2023-04-20 DIAGNOSIS — Z7984 Long term (current) use of oral hypoglycemic drugs: Secondary | ICD-10-CM | POA: Diagnosis not present

## 2023-04-20 NOTE — Progress Notes (Signed)
WEIGHT SUMMARY AND BIOMETRICS  Weight Lost Since Last Visit: 2lb  Vitals Temp: (!) 97.4 F (36.3 C) BP: 124/81 Pulse Rate: 65 SpO2: 95 %   Anthropometric Measurements Height: 5\' 5"  (1.651 m) Weight: 189 lb (85.7 kg) BMI (Calculated): 31.45 Weight at Last Visit: 191lb Weight Lost Since Last Visit: 2lb Starting Weight: 211lb Total Weight Loss (lbs): 22 lb (9.979 kg)   Body Composition  Body Fat %: 45.6 % Fat Mass (lbs): 86.2 lbs Muscle Mass (lbs): 97.6 lbs Total Body Water (lbs): 70.6 lbs Visceral Fat Rating : 15   Other Clinical Data Fasting: no Labs: no Today's Visit #: 9 Starting Date: 10/26/22    OBESITY Catherine Coleman is here to discuss her progress with her obesity treatment plan along with follow-up of her obesity related diagnoses.     Nutrition Plan: the Category 2 plan - 0% adherence.  Current exercise: swimming  Interim History:  She is down 2 lbs since her last visit.  Eating all of the food on the plan., Protein intake is as prescribed, and Is not skipping meals  Pharmacotherapy: Catherine Coleman is on Ozempic 0.25 mg SQ weekly and Metformin daily.  Adverse side effects: GERD She has a history of GERD and takes Prilosec.  She notes that recently since taking the Ozempic that she has had more heartburn especially in the a.m. She denies similar symptoms with the metformin.  She states that she has been taking the Prilosec and some Tums as well to help with the GERD symptoms Hunger is moderately controlled.  Cravings are moderately controlled.  Assessment/Plan:   1. Type 2 diabetes mellitus with obesity (HCC) Type II Diabetes HgbA1c is at goal. Last A1c was 6.0 CBGs: Not checking      Episodes of hypoglycemia: no Medication(s): Ozempic 0.25 mg SQ weekly and Metformin.   Lab Results  Component Value Date   HGBA1C 6.0 01/15/2023   HGBA1C 6.5 10/12/2022   HGBA1C 6.4 06/05/2022   Lab Results  Component Value Date   LDLCALC 51 01/15/2023    CREATININE 0.73 01/15/2023   Lab Results  Component Value Date   GFR 73.17 01/15/2023   GFR 68.76 10/12/2022   GFR 63.91 06/04/2022    Plan: Continue Metformin at the current dose but will hold Ozempic at this time.  She is planning on a vacation and will hold the Ozempic during her vacation secondary to heartburn.  She will continue her Prilosec while she is taking the metformin.  She may resume the Ozempic when she returns home.  May consider increasing the Prilosec dose, and begin papaya enzymes.  If no resolution, of her GERD symptoms, then we will stop her Ozempic, and will send her to GI for an evaluation. Will keep all carbohydrates low both sweets and starches.  Will continue exercise regimen to 30 to 60 minutes on most days of the week.  Aim for 7 to 9 hours of sleep nightly.  Eat more low glycemic index foods.      Generalized Obesity: Current BMI BMI (Calculated): 31.45   Pharmacotherapy Plan Continue  Metformin at her current dose. Will hold Ozempic.   Catherine Coleman is currently in the action stage of change. As such, her goal is to continue with weight loss efforts.  She has agreed to the Category 2 plan.  Exercise goals: Older adults should determine their level of effort for physical activity relative to their level of fitness.   Behavioral modification strategies: increasing lean protein intake, decreasing simple carbohydrates ,  no meal skipping, avoiding temptations, and keep healthy foods in the home.  Catherine Coleman has agreed to follow-up with our clinic in 4 weeks.       Objective:   VITALS: Per patient if applicable, see vitals. GENERAL: Alert and in no acute distress. CARDIOPULMONARY: No increased WOB. Speaking in clear sentences.  PSYCH: Pleasant and cooperative. Speech normal rate and rhythm. Affect is appropriate. Insight and judgement are appropriate. Attention is focused, linear, and appropriate.  NEURO: Oriented as arrived to appointment on time with no  prompting.   Attestation Statements:    This was prepared with the assistance of Engineer, civil (consulting).  Occasional wrong-word or sound-a-like substitutions may have occurred due to the inherent limitations of voice recognition software.   Corinna Capra, DO

## 2023-04-27 ENCOUNTER — Other Ambulatory Visit: Payer: Self-pay | Admitting: Hematology and Oncology

## 2023-04-29 ENCOUNTER — Encounter (INDEPENDENT_AMBULATORY_CARE_PROVIDER_SITE_OTHER): Payer: Self-pay

## 2023-05-07 ENCOUNTER — Telehealth: Payer: Self-pay | Admitting: Family Medicine

## 2023-05-07 NOTE — Telephone Encounter (Signed)
[facility] called stating that they needed clarification if a manufacturer change is ok on a medication before refilling it. Rep stated that the manufacturer would be changing from Amneal to Lupin. Please advise re at the following:  P: [phone] Ref: [id number]

## 2023-05-10 DIAGNOSIS — H353211 Exudative age-related macular degeneration, right eye, with active choroidal neovascularization: Secondary | ICD-10-CM | POA: Diagnosis not present

## 2023-05-11 NOTE — Telephone Encounter (Signed)
Called [facility] spoke with Onalee Hua pharmacist about clarification if a manufacturer change is ok on a medication before refilling it. Rep stated that the manufacturer would be changing from Amneal to Lupin. Spoke with Dr.Blyth about medication changes and she stated if [facility] let the pt know of the changes and pt agrees. Onalee Hua the pharmacist was advised from provider, said they will let pt know.   P: [id number] Ref: [id number]

## 2023-05-18 ENCOUNTER — Encounter: Payer: Self-pay | Admitting: Nurse Practitioner

## 2023-05-18 ENCOUNTER — Ambulatory Visit: Payer: Medicare Other | Admitting: Nurse Practitioner

## 2023-05-18 VITALS — BP 128/83 | HR 85 | Temp 97.7°F | Ht 65.0 in | Wt 186.0 lb

## 2023-05-18 DIAGNOSIS — E669 Obesity, unspecified: Secondary | ICD-10-CM | POA: Diagnosis not present

## 2023-05-18 DIAGNOSIS — E1169 Type 2 diabetes mellitus with other specified complication: Secondary | ICD-10-CM | POA: Diagnosis not present

## 2023-05-18 DIAGNOSIS — K219 Gastro-esophageal reflux disease without esophagitis: Secondary | ICD-10-CM | POA: Diagnosis not present

## 2023-05-18 DIAGNOSIS — Z6830 Body mass index (BMI) 30.0-30.9, adult: Secondary | ICD-10-CM | POA: Diagnosis not present

## 2023-05-18 MED ORDER — OMEPRAZOLE 40 MG PO CPDR: 40.0000 mg | DELAYED_RELEASE_CAPSULE | Freq: Every day | ORAL | 0 refills | Status: DC

## 2023-05-18 NOTE — Progress Notes (Signed)
Office: [phone]  /  Fax: [phone]  WEIGHT SUMMARY AND BIOMETRICS  Weight Lost Since Last Visit: 5lb  Weight Gained Since Last Visit: 0lb   Vitals Temp: 97.7 F (36.5 C) BP: 128/83 Pulse Rate: 85 SpO2: 94 %   Anthropometric Measurements Height: 5\' 5"  (1.651 m) Weight: 186 lb (84.4 kg) BMI (Calculated): 30.95 Weight at Last Visit: 191lb Weight Lost Since Last Visit: 5lb Weight Gained Since Last Visit: 0lb Starting Weight: 211lb Total Weight Loss (lbs): 25 lb (11.3 kg)   Body Composition  Body Fat %: 45.3 % Fat Mass (lbs): 84.4 lbs Muscle Mass (lbs): 96.6 lbs Total Body Water (lbs): 72.2 lbs Visceral Fat Rating : 15   Other Clinical Data Fasting: No Labs: No Today's Visit #: 10 Starting Date: 10/26/22     HPI  Chief Complaint: OBESITY  Catherine Coleman is here to discuss her progress with her obesity treatment plan. She is on the the Category 2 Plan and states she is following her eating plan approximately 5 % of the time. She states she is exercising 60 minutes 3 days per week.  She is starting a new exercise program next week.    Interval History:  Since last office visit she has lost 5 pounds.     Gastroesophageal reflux disease without esophagitis Symptoms are not well-controlled.  She is struggling with reflux and vomiting. The vomiting just happens.  Can occur upon awaking, after taking pills, after eating and also occurs at night when she wakes up and tries to drink something. She stopped Ozempic for 2-3 weeks and didn't seen any improvement with stopping Ozempic concerning reflux.  History of EGD over 10 years ago. Has a history of reflux for years and was told she has a hiatal hernia.   Has gotten worse since July.   Medication: Prilosec 20mg , Pepcid 40mg  and tums daily.   Pharmacotherapy for DMT2:  She is currently taking Ozempic 0.25mg  (restarted taking last week after stopping for 2-3 weeks-her reflux did not get any better after stopping  Ozempic). Did struggle with hunger and cravings after stopping Ozempic.   She restarted Metformin when she stopped Ozempic.  Now that she restarted Ozempic she stopped taking Metformin.       Last A1c was 6.0 She is not checking BS at home.   Episodes of hypoglycemia: no On ARB and statin.  Last eye exam:  2024  Lab Results  Component Value Date   HGBA1C 6.0 01/15/2023   HGBA1C 6.5 10/12/2022   HGBA1C 6.4 06/05/2022   Lab Results  Component Value Date   LDLCALC 51 01/15/2023   CREATININE 0.73 01/15/2023     PHYSICAL EXAM:  Blood pressure 128/83, pulse 85, temperature 97.7 F (36.5 C), height 5\' 5"  (1.651 m), weight 186 lb (84.4 kg), SpO2 94%. Body mass index is 30.95 kg/m.  General: She is overweight, cooperative, alert, well developed, and in no acute distress. PSYCH: Has normal mood, affect and thought process.   Extremities: No edema.  Neurologic: No gross sensory or motor deficits. No tremors or fasciculations noted.    DIAGNOSTIC DATA REVIEWED:  BMET    Component Value Date/Time   NA 136 01/15/2023 0935   NA 137 09/28/2018 1147   K 4.5 01/15/2023 0935   CL 99 01/15/2023 0935   CO2 28 01/15/2023 0935   GLUCOSE 98 01/15/2023 0935   BUN 11 01/15/2023 0935   BUN 9 09/28/2018 1147   CREATININE 0.73 01/15/2023 0935   CREATININE 0.76 01/02/2022  1331   CALCIUM 9.0 01/15/2023 0935   GFRNONAA >60 03/02/2022 1307   GFRNONAA >60 01/02/2022 1331   GFRAA 94 09/28/2018 1147   Lab Results  Component Value Date   HGBA1C 6.0 01/15/2023   HGBA1C 6.4 10/18/2017   Lab Results  Component Value Date   INSULIN 7.9 10/26/2022   INSULIN 15.5 05/30/2018   Lab Results  Component Value Date   TSH 0.79 01/15/2023   CBC    Component Value Date/Time   WBC 5.5 01/15/2023 0935   RBC 4.21 01/15/2023 0935   HGB 13.6 01/15/2023 0935   HGB 13.0 01/02/2022 1331   HCT 40.1 01/15/2023 0935   PLT 236.0 01/15/2023 0935   PLT 197 01/02/2022 1331   MCV 95.2 01/15/2023 0935   MCH  32.3 03/02/2022 1307   MCHC 34.0 01/15/2023 0935   RDW 12.8 01/15/2023 0935   Iron Studies No results found for: "IRON", "TIBC", "FERRITIN", "IRONPCTSAT" Lipid Panel     Component Value Date/Time   CHOL 119 01/15/2023 0935   CHOL 226 (H) 09/28/2018 1147   TRIG 78.0 01/15/2023 0935   HDL 53.10 01/15/2023 0935   HDL 60 09/28/2018 1147   CHOLHDL 2 01/15/2023 0935   VLDL 15.6 01/15/2023 0935   LDLCALC 51 01/15/2023 0935   LDLCALC 134 (H) 09/28/2018 1147   LDLDIRECT 138.0 05/15/2019 1022   Hepatic Function Panel     Component Value Date/Time   PROT 6.5 01/15/2023 0935   PROT 6.7 09/28/2018 1147   ALBUMIN 4.1 01/15/2023 0935   ALBUMIN 4.6 09/28/2018 1147   AST 16 01/15/2023 0935   AST 20 01/02/2022 1331   ALT 13 01/15/2023 0935   ALT 19 01/02/2022 1331   ALKPHOS 91 01/15/2023 0935   BILITOT 0.5 01/15/2023 0935   BILITOT 0.3 01/02/2022 1331      Component Value Date/Time   TSH 0.79 01/15/2023 0935   Nutritional Lab Results  Component Value Date   VD25OH 62.50 01/15/2023   VD25OH 41.92 10/12/2022   VD25OH 34.14 06/04/2022     ASSESSMENT AND PLAN  TREATMENT PLAN FOR OBESITY:  Recommended Dietary Goals  Catherine Coleman is currently in the action stage of change. As such, her goal is to continue weight management plan. She has agreed to the Category 2 Plan.  Behavioral Intervention  We discussed the following Behavioral Modification Strategies today: increasing lean protein intake, decreasing simple carbohydrates , increasing vegetables, increasing lower glycemic fruits, increasing water intake, reading food labels , keeping healthy foods at home, continue to practice mindfulness when eating, and planning for success.  Additional resources provided today: NA  Recommended Physical Activity Goals  Catherine Coleman has been advised to work up to 150 minutes of moderate intensity aerobic activity a week and strengthening exercises 2-3 times per week for cardiovascular health, weight  loss maintenance and preservation of muscle mass.   She has agreed to Continue current level of physical activity    ASSOCIATED CONDITIONS ADDRESSED TODAY  Action/Plan  Type 2 diabetes mellitus with obesity (HCC) Stop Ozempic and Metformin until she sees GI  Gastroesophageal reflux disease, unspecified whether esophagitis present -     Ambulatory referral to Gastroenterology -     Increase Omeprazole; Take 1 capsule (40 mg total) by mouth daily.  Dispense: 30 capsule; Refill: 0. Side effects discussed  Generalized obesity  BMI 30.0-30.9,adult     Has upcoming appt with PCP for follow up and labs.     Return in about 4 weeks (around 06/15/2023).Marland Kitchen She  was informed of the importance of frequent follow up visits to maximize her success with intensive lifestyle modifications for her multiple health conditions.   ATTESTASTION STATEMENTS:  Reviewed by clinician on day of visit: allergies, medications, problem list, medical history, surgical history, family history, social history, and previous encounter notes.   Time spent on visit including pre-visit chart review and post-visit care and charting was 40+ minutes.    Theodis Sato. Khaliya Golinski FNP-C

## 2023-05-21 ENCOUNTER — Other Ambulatory Visit: Payer: Self-pay | Admitting: Family Medicine

## 2023-05-25 ENCOUNTER — Other Ambulatory Visit: Payer: Medicare Other

## 2023-06-01 ENCOUNTER — Telehealth: Payer: Self-pay

## 2023-06-01 ENCOUNTER — Ambulatory Visit: Payer: Medicare Other | Admitting: Family Medicine

## 2023-06-01 ENCOUNTER — Other Ambulatory Visit (INDEPENDENT_AMBULATORY_CARE_PROVIDER_SITE_OTHER): Payer: Medicare Other

## 2023-06-01 DIAGNOSIS — E669 Obesity, unspecified: Secondary | ICD-10-CM

## 2023-06-01 DIAGNOSIS — E559 Vitamin D deficiency, unspecified: Secondary | ICD-10-CM

## 2023-06-01 DIAGNOSIS — I10 Essential (primary) hypertension: Secondary | ICD-10-CM

## 2023-06-01 DIAGNOSIS — E785 Hyperlipidemia, unspecified: Secondary | ICD-10-CM

## 2023-06-01 DIAGNOSIS — E1169 Type 2 diabetes mellitus with other specified complication: Secondary | ICD-10-CM

## 2023-06-01 DIAGNOSIS — E038 Other specified hypothyroidism: Secondary | ICD-10-CM

## 2023-06-01 LAB — LIPID PANEL
Cholesterol: 149 mg/dL (ref 0–200)
HDL: 66.2 mg/dL (ref >39.00)
LDL Cholesterol: 61 mg/dL (ref 0–99)
NonHDL: 82.53
Total CHOL/HDL Ratio: 2
Triglycerides: 107 mg/dL (ref 0.0–149.0)
VLDL: 21.4 mg/dL (ref 0.0–40.0)

## 2023-06-01 LAB — CBC WITH DIFFERENTIAL/PLATELET
Basophils Absolute: 0 10*3/uL (ref 0.0–0.1)
Basophils Relative: 0.7 % (ref 0.0–3.0)
Eosinophils Absolute: 0.1 10*3/uL (ref 0.0–0.7)
Eosinophils Relative: 3 % (ref 0.0–5.0)
HCT: 41.8 % (ref 36.0–46.0)
Hemoglobin: 13.8 g/dL (ref 12.0–15.0)
Lymphocytes Relative: 25.7 % (ref 12.0–46.0)
Lymphs Abs: 1.1 10*3/uL (ref 0.7–4.0)
MCHC: 33.1 g/dL (ref 30.0–36.0)
MCV: 96.2 fl (ref 78.0–100.0)
Monocytes Absolute: 0.7 10*3/uL (ref 0.1–1.0)
Monocytes Relative: 16.2 % — ABNORMAL HIGH (ref 3.0–12.0)
Neutro Abs: 2.3 10*3/uL (ref 1.4–7.7)
Neutrophils Relative %: 54.4 % (ref 43.0–77.0)
Platelets: 195 10*3/uL (ref 150.0–400.0)
RBC: 4.35 Mil/uL (ref 3.87–5.11)
RDW: 13.7 % (ref 11.5–15.5)
WBC: 4.3 10*3/uL (ref 4.0–10.5)

## 2023-06-01 LAB — COMPREHENSIVE METABOLIC PANEL
ALT: 15 U/L (ref 0–35)
AST: 20 U/L (ref 0–37)
Albumin: 4.1 g/dL (ref 3.5–5.2)
Alkaline Phosphatase: 98 U/L (ref 39–117)
BUN: 17 mg/dL (ref 6–23)
CO2: 28 meq/L (ref 19–32)
Calcium: 9.2 mg/dL (ref 8.4–10.5)
Chloride: 99 meq/L (ref 96–112)
Creatinine, Ser: 0.65 mg/dL (ref 0.40–1.20)
GFR: 78.13 mL/min (ref >60.00)
Glucose, Bld: 99 mg/dL (ref 70–99)
Potassium: 5 meq/L (ref 3.5–5.1)
Sodium: 137 meq/L (ref 135–145)
Total Bilirubin: 0.4 mg/dL (ref 0.2–1.2)
Total Protein: 6.5 g/dL (ref 6.0–8.3)

## 2023-06-01 LAB — HEMOGLOBIN A1C: Hgb A1c MFr Bld: 5.9 % (ref 4.6–6.5)

## 2023-06-01 LAB — TSH: TSH: 2.1 u[IU]/mL (ref 0.35–5.50)

## 2023-06-01 NOTE — Telephone Encounter (Signed)
Pt came in for labs , pt stated she had a flu shot on Saturday at the pharmacy , pt arm has a lump , redness from deltoid to elbow area and had warmth around injection site. Pt is wondering what needs to be done

## 2023-06-02 NOTE — Telephone Encounter (Signed)
Pt said that her arm is getting better and she doesn't think she needs anything at this time.

## 2023-06-02 NOTE — Telephone Encounter (Signed)
LMOM for pt to return call.

## 2023-06-04 LAB — VITAMIN D 1,25 DIHYDROXY
Vitamin D 1, 25 (OH)2 Total: 47 pg/mL (ref 18–72)
Vitamin D2 1, 25 (OH)2: <8 pg/mL
Vitamin D3 1, 25 (OH)2: 47 pg/mL

## 2023-06-08 ENCOUNTER — Encounter: Payer: Self-pay | Admitting: Family Medicine

## 2023-06-08 ENCOUNTER — Ambulatory Visit: Payer: Medicare Other | Admitting: Family Medicine

## 2023-06-08 ENCOUNTER — Ambulatory Visit (HOSPITAL_BASED_OUTPATIENT_CLINIC_OR_DEPARTMENT_OTHER)
Admission: RE | Admit: 2023-06-08 | Discharge: 2023-06-08 | Disposition: A | Discharge status: Home / Self Care | Payer: Medicare Other | Source: Ambulatory Visit | Attending: Family Medicine | Admitting: Family Medicine

## 2023-06-08 VITALS — BP 126/80 | HR 81 | Temp 98.0°F | Resp 16 | Ht 65.0 in | Wt 195.0 lb

## 2023-06-08 DIAGNOSIS — E038 Other specified hypothyroidism: Secondary | ICD-10-CM | POA: Diagnosis not present

## 2023-06-08 DIAGNOSIS — E669 Obesity, unspecified: Secondary | ICD-10-CM

## 2023-06-08 DIAGNOSIS — M549 Dorsalgia, unspecified: Secondary | ICD-10-CM

## 2023-06-08 DIAGNOSIS — M545 Low back pain, unspecified: Secondary | ICD-10-CM

## 2023-06-08 DIAGNOSIS — M5136 Other intervertebral disc degeneration, lumbar region: Secondary | ICD-10-CM | POA: Diagnosis not present

## 2023-06-08 DIAGNOSIS — E1169 Type 2 diabetes mellitus with other specified complication: Secondary | ICD-10-CM

## 2023-06-08 DIAGNOSIS — E538 Deficiency of other specified B group vitamins: Secondary | ICD-10-CM | POA: Diagnosis not present

## 2023-06-08 DIAGNOSIS — E2839 Other primary ovarian failure: Secondary | ICD-10-CM

## 2023-06-08 DIAGNOSIS — Z6832 Body mass index (BMI) 32.0-32.9, adult: Secondary | ICD-10-CM

## 2023-06-08 DIAGNOSIS — E559 Vitamin D deficiency, unspecified: Secondary | ICD-10-CM | POA: Diagnosis not present

## 2023-06-08 DIAGNOSIS — E785 Hyperlipidemia, unspecified: Secondary | ICD-10-CM | POA: Diagnosis not present

## 2023-06-08 DIAGNOSIS — I700 Atherosclerosis of aorta: Secondary | ICD-10-CM | POA: Diagnosis not present

## 2023-06-08 DIAGNOSIS — M810 Age-related osteoporosis without current pathological fracture: Secondary | ICD-10-CM | POA: Diagnosis not present

## 2023-06-08 DIAGNOSIS — R252 Cramp and spasm: Secondary | ICD-10-CM | POA: Diagnosis not present

## 2023-06-08 DIAGNOSIS — M438X5 Other specified deforming dorsopathies, thoracolumbar region: Secondary | ICD-10-CM | POA: Diagnosis not present

## 2023-06-08 DIAGNOSIS — I10 Essential (primary) hypertension: Secondary | ICD-10-CM | POA: Diagnosis not present

## 2023-06-08 DIAGNOSIS — Z780 Asymptomatic menopausal state: Secondary | ICD-10-CM

## 2023-06-08 MED ORDER — OZEMPIC (0.25 OR 0.5 MG/DOSE) 2 MG/3ML ~~LOC~~ SOPN: 0.2500 mg | PEN_INJECTOR | SUBCUTANEOUS | 2 refills | Status: DC

## 2023-06-08 NOTE — Assessment & Plan Note (Signed)
Encourage heart healthy diet such as MIND or DASH diet, increase exercise, avoid trans fats, simple carbohydrates and processed foods, consider a krill or fish or flaxseed oil cap daily. Tolerating Rosuvastatin. hgba1c acceptable, minimize simple carbs. Increase exercise as tolerated.

## 2023-06-08 NOTE — Progress Notes (Signed)
Subjective:    Patient ID: Catherine Coleman, female    DOB: 1932/05/04, 89 y.o.   MRN: [id number]  Chief Complaint  Patient presents with  . Follow-up    Follow up    HPI Discussed the use of AI scribe software for clinical note transcription with the patient, who gave verbal consent to proceed.  History of Present Illness   The patient, with a history of bilateral hip replacements, shoulder replacements, knee replacements, and a rod in the cervical spine from C3-C7, presents with chronic right hip pain and mid back pain. The pain is constant, not worse in the morning or night, and is severe enough to interrupt daily activities such as cooking. The patient has to lie down and straighten out her back for relief. Previous injections in the hip have not provided significant relief. The patient denies recent falls, trauma, changes in bowel habits, urinary symptoms, or symptoms of systemic illness.  The patient also reports nocturnal urinary incontinence, which has been a long-standing issue. She denies recent worsening of fecal incontinence or radicular symptoms down the legs.  The patient has stopped taking Ozempic due to nausea and heartburn. She is scheduled to have a glucometer and Ozempic sent to [facility]  The patient also reports osteopenia and is due for a bone density scan. However, due to bilateral hip and shoulder replacements, and a rod in the cervical spine, the scan can only be done on the wrist. The patient reports that a previous scan on the wrist was inconclusive.        Past Medical History:  Diagnosis Date  . Arthritis   . Atrophic rhinitis 04/21/2016  . Back pain   . Breast cancer (HCC) 12/2020   left breast IDC  . Constipation   . Decreased hearing   . Diverticulitis   . Dry eyes   . Fatigue   . Floaters in visual field   . Gall bladder disease   . GERD (gastroesophageal reflux disease)   . Glaucoma   . Hiatal hernia with gastroesophageal reflux    . History of blood transfusion    for Knee replacement  . Hyperlipidemia, mild 04/02/2016  . Hypertension   . Hypothyroidism   . Joint pain   . Migraine aura without headache   . Muscle pain   . Muscle stiffness   . Nasal congestion   . Obesity 04/02/2016  . Osteoarthritis   . Parotiditis 04/21/2016  . Pre-diabetes   . Vitamin D deficiency 04/02/2016    Past Surgical History:  Procedure Laterality Date  . APPENDECTOMY    . BREAST LUMPECTOMY WITH RADIOACTIVE SEED LOCALIZATION Left 01/21/2021   Procedure: LEFT BREAST LUMPECTOMY WITH RADIOACTIVE SEED LOCALIZATION;  Surgeon: Abigail Miyamoto, MD;  Location: [facility];  Service: General;  Laterality: Left;  . cataract surgery  2008  . CHOLECYSTECTOMY  2010  . JOINT REPLACEMENT Left    2012  . MASTOIDECTOMY    . POSTERIOR CERVICAL FUSION/FORAMINOTOMY N/A 04/27/2017   Procedure: CERVICAL FIVE-SIX  OPEN REDUCTION OF FRACTURE, CERVICAL THREE-SEVEN  DORSAL FIXATION AND FUSION;  Surgeon: Ditty, Loura Halt, MD;  Location: [facility] OR;  Service: Neurosurgery;  Laterality: N/A;  . SHOULDER ARTHROSCOPY Bilateral prior to 2010  . TONSILLECTOMY    . TOTAL HIP ARTHROPLASTY Right 03/22/2015   Procedure: RIGHT TOTAL HIP ARTHROPLASTY ANTERIOR APPROACH;  Surgeon: Jodi Geralds, MD;  Location: [facility] OR;  Service: Orthopedics;  Laterality: Right;  . TOTAL KNEE ARTHROPLASTY Bilateral 2007  .  TUBAL LIGATION      Family History  Problem Relation Age of Onset  . Dementia Mother   . Hypertension Mother   . Obesity Mother   . Diabetes Mother   . Heart disease Father   . Hypertension Father   . Stroke Father   . Diabetes Father   . Hyperlipidemia Father   . Alcoholism Father   . Obesity Father   . Cancer Maternal Grandfather        colon cancer  . Arthritis Daughter     Social History   Socioeconomic History  . Marital status: Widowed    Spouse name: Not on file  . Number of children: Not on file  . Years of education: Not on  file  . Highest education level: Not on file  Occupational History  . Occupation: Retired  Tobacco Use  . Smoking status: Never  . Smokeless tobacco: Never  . Tobacco comments:    Stopped 50 years ago.   Vaping Use  . Vaping status: Never Used  Substance and Sexual Activity  . Alcohol use: Yes    Alcohol/week: 0.0 standard drinks of alcohol    Comment: socially 3-4x/wk  . Drug use: No  . Sexual activity: Not Currently  Other Topics Concern  . Not on file  Social History Narrative   Lives at [facility], widowed 7 years, no dietary restrictions. Retired from neuropsychiatry work.    Social Determinants of Health   Financial Resource Strain: Low Risk  (10/21/2022)   Overall Financial Resource Strain (CARDIA)   . Difficulty of Paying Living Expenses: Not hard at all  Food Insecurity: No Food Insecurity (10/21/2022)   Hunger Vital Sign   . Worried About Programme researcher, broadcasting/film/video in the Last Year: Never true   . Ran Out of Food in the Last Year: Never true  Transportation Needs: No Transportation Needs (10/21/2022)   PRAPARE - Transportation   . Lack of Transportation (Medical): No   . Lack of Transportation (Non-Medical): No  Physical Activity: Sufficiently Active (10/21/2022)   Exercise Vital Sign   . Days of Exercise per Week: 3 days   . Minutes of Exercise per Session: 60 min  Stress: No Stress Concern Present (10/21/2022)   [employer] of Occupational Health - Occupational Stress Questionnaire   . Feeling of Stress : Not at all  Social Connections: Moderately Isolated (10/21/2022)   Social Connection and Isolation Panel [NHANES]   . Frequency of Communication with Friends and Family: More than three times a week   . Frequency of Social Gatherings with Friends and Family: More than three times a week   . Attends Religious Services: Never   . Active Member of Clubs or Organizations: Yes   . Attends Banker Meetings: 1 to 4 times per year   . Marital Status: Widowed   Intimate Partner Violence: Not At Risk (10/21/2022)   Humiliation, Afraid, Rape, and Kick questionnaire   . Fear of Current or Ex-Partner: No   . Emotionally Abused: No   . Physically Abused: No   . Sexually Abused: No    Outpatient Medications Prior to Visit  Medication Sig Dispense Refill  . anastrozole (ARIMIDEX) 1 MG tablet TAKE 1 TABLET BY MOUTH DAILY 100 tablet 2  . Biotin 40981 MCG TABS Take 1 tablet by mouth daily.    . brimonidine (ALPHAGAN) 0.2 % ophthalmic solution Place 1 drop into both eyes 2 (two) times daily. 5 mL 12  . Calcium  Carb-Cholecalciferol 600-10 MG-MCG TABS Take 1 tablet by mouth daily.    Marland Kitchen levothyroxine (SYNTHROID) 125 MCG tablet TAKE 1 TABLET BY MOUTH DAILY  BEFORE BREAKFAST 100 tablet 2  . losartan (COZAAR) 50 MG tablet TAKE 1 TABLET BY MOUTH DAILY 100 tablet 2  . MAGNESIUM-POTASSIUM PO Take 1 tablet by mouth daily.    . Multiple Vitamins-Minerals (MULTIVITAMIN WITH MINERALS) tablet Take 1 tablet by mouth daily.    . Multiple Vitamins-Minerals (PRESERVISION AREDS PO) Take 1 tablet by mouth daily.    Marland Kitchen MYRBETRIQ 50 MG TB24 tablet TAKE 1 TABLET BY MOUTH DAILY 100 tablet 2  . omeprazole (PRILOSEC) 40 MG capsule Take 1 capsule (40 mg total) by mouth daily. 30 capsule 0  . rosuvastatin (CRESTOR) 5 MG tablet TAKE 1 TABLET BY MOUTH DAILY 100 tablet 2  . Vitamin D, Cholecalciferol, 25 MCG (1000 UT) CAPS Take 1 capsule by mouth daily.    . cephALEXin (KEFLEX) 500 MG capsule Take 1 capsule (500 mg total) by mouth 4 (four) times daily. 40 capsule 0  . Insulin Pen Needle (NOVOFINE PLUS PEN NEEDLE) 32G X 4 MM MISC Use as directed with Ozempic 30 each 0  . Semaglutide,0.25 or 0.5MG /DOS, (OZEMPIC, 0.25 OR 0.5 MG/DOSE,) 2 MG/3ML SOPN INJECT 0.25 MILLIGRAMS INTO THE SKIN WEEKLY 3 mL 0  . famotidine (PEPCID) 40 MG tablet TAKE 1 TABLET BY MOUTH AT  BEDTIME AS NEEDED FOR HEARTBURN  OR INDIGESTION (Patient not taking: Reported on 06/08/2023) 100 tablet 2  . metFORMIN (GLUCOPHAGE)  500 MG tablet Take 1 tablet (500 mg total) by mouth daily after lunch. (Patient not taking: Reported on 06/08/2023) 90 tablet 0  . COVID-19 mRNA bivalent vaccine, Moderna, (MODERNA COVID-19 BIVAL BOOSTER) 50 MCG/0.5ML injection Inject into the muscle. 0.5 mL 0  . COVID-19 mRNA vaccine 2023-2024 (COMIRNATY) SUSP injection Inject into the muscle. 0.3 mL 0  . COVID-19 mRNA vaccine 2023-2024 (COMIRNATY) syringe Inject into the muscle. 0.3 mL 0  . polyethylene glycol (MIRALAX / GLYCOLAX) packet Take 17 g by mouth daily. (Patient taking differently: Take 17 g by mouth daily as needed.) 14 each 0  . RSV vaccine recomb adjuvanted (AREXVY) 120 MCG/0.5ML injection Inject into the muscle. 1 each 0   No facility-administered medications prior to visit.    No Known Allergies  Review of Systems  Constitutional:  Positive for malaise/fatigue. Negative for fever.  HENT:  Negative for congestion.   Eyes:  Negative for blurred vision.  Respiratory:  Negative for shortness of breath.   Cardiovascular:  Negative for chest pain, palpitations and leg swelling.  Gastrointestinal:  Negative for abdominal pain, blood in stool and nausea.  Genitourinary:  Negative for dysuria and frequency.  Musculoskeletal:  Positive for back pain and joint pain. Negative for falls.  Skin:  Negative for rash.  Neurological:  Negative for dizziness, loss of consciousness and headaches.  Endo/Heme/Allergies:  Negative for environmental allergies.  Psychiatric/Behavioral:  Negative for depression. The patient is not nervous/anxious.       Objective:    Physical Exam Constitutional:      General: She is not in acute distress.    Appearance: Normal appearance. She is well-developed. She is not toxic-appearing.  HENT:     Head: Normocephalic and atraumatic.     Right Ear: External ear normal.     Left Ear: External ear normal.     Nose: Nose normal.  Eyes:     General:        Right eye:  No discharge.        Left eye: No  discharge.     Conjunctiva/sclera: Conjunctivae normal.  Neck:     Thyroid: No thyromegaly.  Cardiovascular:     Rate and Rhythm: Normal rate and regular rhythm.     Heart sounds: Normal heart sounds. No murmur heard. Pulmonary:     Effort: Pulmonary effort is normal. No respiratory distress.     Breath sounds: Normal breath sounds.  Abdominal:     General: Bowel sounds are normal.     Palpations: Abdomen is soft.     Tenderness: There is no abdominal tenderness. There is no guarding.  Musculoskeletal:        General: Normal range of motion.     Cervical back: Neck supple.  Lymphadenopathy:     Cervical: No cervical adenopathy.  Skin:    General: Skin is warm and dry.  Neurological:     Mental Status: She is alert and oriented to person, place, and time.  Psychiatric:        Mood and Affect: Mood normal.        Behavior: Behavior normal.        Thought Content: Thought content normal.        Judgment: Judgment normal.   BP 126/80 (BP Location: Left Arm, Patient Position: Sitting, Cuff Size: Normal)   Pulse 81   Temp 98 F (36.7 C) (Oral)   Resp 16   Ht 5\' 5"  (1.651 m)   Wt 195 lb (88.5 kg)   SpO2 98%   BMI 32.45 kg/m  Wt Readings from Last 3 Encounters:  06/08/23 195 lb (88.5 kg)  05/18/23 186 lb (84.4 kg)  04/20/23 189 lb (85.7 kg)    Diabetic Foot Exam - Simple   No data filed    Lab Results  Component Value Date   WBC 4.3 06/01/2023   HGB 13.8 06/01/2023   HCT 41.8 06/01/2023   PLT 195.0 06/01/2023   GLUCOSE 99 06/01/2023   CHOL 149 06/01/2023   TRIG 107.0 06/01/2023   HDL 66.20 06/01/2023   LDLDIRECT 138.0 05/15/2019   LDLCALC 61 06/01/2023   ALT 15 06/01/2023   AST 20 06/01/2023   NA 137 06/01/2023   K 5.0 06/01/2023   CL 99 06/01/2023   CREATININE 0.65 06/01/2023   BUN 17 06/01/2023   CO2 28 06/01/2023   TSH 2.10 06/01/2023   INR 1.05 04/28/2017   HGBA1C 5.9 06/01/2023    Lab Results  Component Value Date   TSH 2.10 06/01/2023   Lab  Results  Component Value Date   WBC 4.3 06/01/2023   HGB 13.8 06/01/2023   HCT 41.8 06/01/2023   MCV 96.2 06/01/2023   PLT 195.0 06/01/2023   Lab Results  Component Value Date   NA 137 06/01/2023   K 5.0 06/01/2023   CO2 28 06/01/2023   GLUCOSE 99 06/01/2023   BUN 17 06/01/2023   CREATININE 0.65 06/01/2023   BILITOT 0.4 06/01/2023   ALKPHOS 98 06/01/2023   AST 20 06/01/2023   ALT 15 06/01/2023   PROT 6.5 06/01/2023   ALBUMIN 4.1 06/01/2023   CALCIUM 9.2 06/01/2023   ANIONGAP 11 03/02/2022   GFR 78.13 06/01/2023   Lab Results  Component Value Date   CHOL 149 06/01/2023   Lab Results  Component Value Date   HDL 66.20 06/01/2023   Lab Results  Component Value Date   LDLCALC 61 06/01/2023   Lab Results  Component Value  Date   TRIG 107.0 06/01/2023   Lab Results  Component Value Date   CHOLHDL 2 06/01/2023   Lab Results  Component Value Date   HGBA1C 5.9 06/01/2023       Assessment & Plan:  Muscle cramps Assessment & Plan: Hydrate and monitor    Type 2 diabetes mellitus with obesity (HCC) Assessment & Plan: hgba1c acceptable, minimize simple carbs. Increase exercise as tolerated. Set up with glucometer, lancets and test strips to check sugars daily to weekly and prn   Vitamin B 12 deficiency Assessment & Plan: Supplement and monitor    Vitamin D deficiency Assessment & Plan: Supplement and monitor    Primary hypertension Assessment & Plan: Well controlled, no changes to meds. Encouraged heart healthy diet such as the DASH diet and exercise as tolerated.     Hyperlipidemia associated with type 2 diabetes mellitus (HCC) Assessment & Plan: Encourage heart healthy diet such as MIND or DASH diet, increase exercise, avoid trans fats, simple carbohydrates and processed foods, consider a krill or fish or flaxseed oil cap daily. Tolerating Rosuvastatin. hgba1c acceptable, minimize simple carbs. Increase exercise as tolerated.   Other specified  hypothyroidism Assessment & Plan: On Levothyroxine, continue to monitor   Back pain, unspecified back location, unspecified back pain laterality, unspecified chronicity -     DG Lumbar Spine Complete; Future  Post-menopause -     DG Bone Density; Future  Estrogen deficiency -     DG Bone Density; Future  Low back pain, unspecified back pain laterality, unspecified chronicity, unspecified whether sciatica present Assessment & Plan: Worsening low back pain with persistent right hip pain she has [facility] and they have both tried injections in hip without any significant relief. She needs further evaluation of her lower back. She is a vibrant and active 89 yo and is interested in evaluating what her ongoing options are. Will start with low back xray and then likely move onto a CT scan. She has significant numbers or joint replacements including b/l hips, knees, shoulders so MRI in not an option.   Morbid obesity (HCC) Assessment & Plan: Follows with [facility] but stopped Ozempic with an episode of nausea. We will restart today but if nausea returns she will stop it. She plans to return to [facility]   Other orders -     Ozempic (0.25 or 0.5 MG/DOSE); Inject 0.25 mg into the skin once a week.  Dispense: 3 mL; Refill: 2    Assessment and Plan    Chronic Back and Hip Pain Persistent pain in the right hip and mid-back. Previous injections have not provided significant relief. No recent falls, trauma, or radicular symptoms. -Order lumbar X-ray for further evaluation. -consider referral to Dr. Myer Haff, neurosurgeon, for consultation after obtaining CT scan results.  Urinary Incontinence Nocturnal incontinence ongoing, likely due to bladder overfilling and gravity. No recent worsening. -No changes to current management plan.  Diabetes Management Stopped Ozempic due to nausea and heartburn. Plan to restart at 0.25 mg dose weekly. -Restart Ozempic 0.25 mg  weekly. -Refer to GI for evaluation of nausea and heartburn. -Ensure glucometer and Ozempic are sent to [facility] for patient convenience.  Osteopenia Patient with bilateral hip and shoulder replacements, knee replacements, and cervical spine rod. Need for updated bone density evaluation. -Order bone density scan, consult with radiologist regarding best location given patient's hardware.  Follow-up Plan for return visit after the holidays, with interim nurse visit in 4 weeks  for BP and pulse check and glucometer instructions.         Danise Edge, MD

## 2023-06-08 NOTE — Patient Instructions (Signed)

## 2023-06-08 NOTE — Assessment & Plan Note (Signed)
Worsening low back pain with persistent right hip pain she has seen the [facility] and [facility] and they have both tried injections in hip without any significant relief. She needs further evaluation of her lower back. She is a vibrant and active 88 yo and is interested in evaluating what her ongoing options are. Will start with low back xray and then likely move onto a CT scan. She has significant numbers or joint replacements including b/l hips, knees, shoulders so MRI in not an option.

## 2023-06-08 NOTE — Assessment & Plan Note (Signed)
Hydrate and monitor 

## 2023-06-08 NOTE — Assessment & Plan Note (Signed)
Supplement and monitor 

## 2023-06-08 NOTE — Assessment & Plan Note (Signed)
hgba1c acceptable, minimize simple carbs. Increase exercise as tolerated. Set up with glucometer, lancets and test strips to check sugars daily to weekly and prn

## 2023-06-08 NOTE — Assessment & Plan Note (Signed)
On Levothyroxine, continue to monitor

## 2023-06-08 NOTE — Assessment & Plan Note (Signed)
Follows with [facility] but stopped Ozempic with an episode of nausea. We will restart today but if nausea returns she will stop it. She plans to return to [facility]

## 2023-06-08 NOTE — Assessment & Plan Note (Signed)
Well controlled, no changes to meds. Encouraged heart healthy diet such as the DASH diet and exercise as tolerated.  °

## 2023-06-09 MED ORDER — LANCET DEVICE MISC: 1.0000 | Freq: Three times a day (TID) | 0 refills | Status: AC

## 2023-06-09 MED ORDER — BLOOD GLUCOSE MONITORING SUPPL DEVI: 1.0000 | Freq: Three times a day (TID) | 0 refills | Status: DC

## 2023-06-09 MED ORDER — BLOOD GLUCOSE TEST VI STRP: 1.0000 | ORAL_STRIP | Freq: Three times a day (TID) | 0 refills | Status: AC

## 2023-06-09 MED ORDER — LANCETS MISC. MISC: 1.0000 | Freq: Three times a day (TID) | 0 refills | Status: AC

## 2023-06-09 NOTE — Addendum Note (Signed)
Addended by: Kathi Ludwig on: 06/09/2023 03:47 PM   Modules accepted: Orders

## 2023-06-15 ENCOUNTER — Ambulatory Visit: Payer: Medicare Other | Admitting: Nurse Practitioner

## 2023-06-16 DIAGNOSIS — H353211 Exudative age-related macular degeneration, right eye, with active choroidal neovascularization: Secondary | ICD-10-CM | POA: Diagnosis not present

## 2023-06-17 ENCOUNTER — Other Ambulatory Visit: Payer: Self-pay | Admitting: Family Medicine

## 2023-06-21 ENCOUNTER — Ambulatory Visit (INDEPENDENT_AMBULATORY_CARE_PROVIDER_SITE_OTHER): Payer: Medicare Other | Admitting: Nurse Practitioner

## 2023-06-21 ENCOUNTER — Encounter: Payer: Self-pay | Admitting: Nurse Practitioner

## 2023-06-21 ENCOUNTER — Ambulatory Visit: Payer: Medicare Other | Admitting: Nurse Practitioner

## 2023-06-21 VITALS — BP 139/82 | HR 80 | Temp 97.9°F | Ht 65.0 in | Wt 189.0 lb

## 2023-06-21 DIAGNOSIS — E669 Obesity, unspecified: Secondary | ICD-10-CM | POA: Diagnosis not present

## 2023-06-21 DIAGNOSIS — E1169 Type 2 diabetes mellitus with other specified complication: Secondary | ICD-10-CM | POA: Diagnosis not present

## 2023-06-21 DIAGNOSIS — Z7985 Long-term (current) use of injectable non-insulin antidiabetic drugs: Secondary | ICD-10-CM | POA: Diagnosis not present

## 2023-06-21 DIAGNOSIS — Z6831 Body mass index (BMI) 31.0-31.9, adult: Secondary | ICD-10-CM

## 2023-06-21 DIAGNOSIS — K219 Gastro-esophageal reflux disease without esophagitis: Secondary | ICD-10-CM

## 2023-06-21 NOTE — Progress Notes (Signed)
Office: [phone]  /  Fax: [phone]  WEIGHT SUMMARY AND BIOMETRICS  Weight Lost Since Last Visit: 0lb  Weight Gained Since Last Visit: 3lb   Vitals Temp: 97.9 F (36.6 C) BP: 139/82 Pulse Rate: 80 SpO2: 98 %   Anthropometric Measurements Height: 5\' 5"  (1.651 m) Weight: 189 lb (85.7 kg) BMI (Calculated): 31.45 Weight at Last Visit: 186lb Weight Lost Since Last Visit: 0lb Weight Gained Since Last Visit: 3lb Starting Weight: 211lb Total Weight Loss (lbs): 22 lb (9.979 kg)   Body Composition  Body Fat %: 45.8 % Fat Mass (lbs): 86.6 lbs Muscle Mass (lbs): 97.4 lbs Total Body Water (lbs): 71.8 lbs Visceral Fat Rating : 15   Other Clinical Data Fasting: Yes Labs: No Today's Visit #: 11 Starting Date: 10/26/22     HPI  Chief Complaint: OBESITY  Catherine Coleman is here to discuss her progress with her obesity treatment plan. She is on the the Category 2 Plan and states she is following her eating plan approximately 0 % of the time. She states she is exercising 60-90 minutes 3-5 days per week.   Interval History:  Since last office visit she has gained 3 pounds. She     Pharmacotherapy for weight loss: She is not currently taking medications  for medical weight loss.      Pharmacotherapy for DMT2:  She is currently taking Ozempic 0.25mg  (restarted back by PCP on 06/08/23-has taken for 2 days).  Doesn't feel that reflux and vomiting has gotten worse since restarting Ozempic.  I had stopped her Ozempic after her last visit with me due to vomiting and reflux and referred her to GI.  She has an appt with GI on 06/25/23/ Last A1c was 5.9 on 06/01/23 She is not checking BS at home.  Glucometer and supplies ordered by PCP.   Episodes of hypoglycemia: no On ARB and statin.  Last eye exam:  2024 Struggling with polyphagia and cravings since stopping Ozempic.    Lab Results  Component Value Date   HGBA1C 5.9 06/01/2023   HGBA1C 6.0 01/15/2023   HGBA1C 6.5  10/12/2022   Lab Results  Component Value Date   LDLCALC 61 06/01/2023   CREATININE 0.65 06/01/2023    GERD Has appt on 06/25/23 with GI.  She is taking Prilosec 40mg  daily and TUMS once per week. She notes she has vomited once or twice since stopping Ozempic. Was restarted on Ozempic by PCP.    PHYSICAL EXAM:  Blood pressure 139/82, pulse 80, temperature 97.9 F (36.6 C), height 5\' 5"  (1.651 m), weight 189 lb (85.7 kg), SpO2 98%. Body mass index is 31.45 kg/m.  General: She is overweight, cooperative, alert, well developed, and in no acute distress. PSYCH: Has normal mood, affect and thought process.   Extremities: No edema.  Neurologic: No gross sensory or motor deficits. No tremors or fasciculations noted.    DIAGNOSTIC DATA REVIEWED:  BMET    Component Value Date/Time   NA 137 06/01/2023 1307   NA 137 09/28/2018 1147   K 5.0 06/01/2023 1307   CL 99 06/01/2023 1307   CO2 28 06/01/2023 1307   GLUCOSE 99 06/01/2023 1307   BUN 17 06/01/2023 1307   BUN 9 09/28/2018 1147   CREATININE 0.65 06/01/2023 1307   CREATININE 0.76 01/02/2022 1331   CALCIUM 9.2 06/01/2023 1307   GFRNONAA >60 03/02/2022 1307   GFRNONAA >60 01/02/2022 1331   GFRAA 94 09/28/2018 1147   Lab Results  Component Value Date  HGBA1C 5.9 06/01/2023   HGBA1C 6.4 10/18/2017   Lab Results  Component Value Date   INSULIN 7.9 10/26/2022   INSULIN 15.5 05/30/2018   Lab Results  Component Value Date   TSH 2.10 06/01/2023   CBC    Component Value Date/Time   WBC 4.3 06/01/2023 1307   RBC 4.35 06/01/2023 1307   HGB 13.8 06/01/2023 1307   HGB 13.0 01/02/2022 1331   HCT 41.8 06/01/2023 1307   PLT 195.0 06/01/2023 1307   PLT 197 01/02/2022 1331   MCV 96.2 06/01/2023 1307   MCH 32.3 03/02/2022 1307   MCHC 33.1 06/01/2023 1307   RDW 13.7 06/01/2023 1307   Iron Studies No results found for: "IRON", "TIBC", "FERRITIN", "IRONPCTSAT" Lipid Panel     Component Value Date/Time   CHOL 149 06/01/2023  1307   CHOL 226 (H) 09/28/2018 1147   TRIG 107.0 06/01/2023 1307   HDL 66.20 06/01/2023 1307   HDL 60 09/28/2018 1147   CHOLHDL 2 06/01/2023 1307   VLDL 21.4 06/01/2023 1307   LDLCALC 61 06/01/2023 1307   LDLCALC 134 (H) 09/28/2018 1147   LDLDIRECT 138.0 05/15/2019 1022   Hepatic Function Panel     Component Value Date/Time   PROT 6.5 06/01/2023 1307   PROT 6.7 09/28/2018 1147   ALBUMIN 4.1 06/01/2023 1307   ALBUMIN 4.6 09/28/2018 1147   AST 20 06/01/2023 1307   AST 20 01/02/2022 1331   ALT 15 06/01/2023 1307   ALT 19 01/02/2022 1331   ALKPHOS 98 06/01/2023 1307   BILITOT 0.4 06/01/2023 1307   BILITOT 0.3 01/02/2022 1331      Component Value Date/Time   TSH 2.10 06/01/2023 1307   Nutritional Lab Results  Component Value Date   VD25OH 62.50 01/15/2023   VD25OH 41.92 10/12/2022   VD25OH 34.14 06/04/2022     ASSESSMENT AND PLAN  TREATMENT PLAN FOR OBESITY:  Recommended Dietary Goals  Catherine Coleman is currently in the action stage of change. As such, her goal is to continue weight management plan. She has agreed to the Category 2 Plan.  Behavioral Intervention  We discussed the following Behavioral Modification Strategies today: increasing lean protein intake to established goals, decreasing simple carbohydrates , increasing vegetables, increasing fiber rich foods, avoiding skipping meals, increasing water intake , work on meal planning and preparation, and continue to work on maintaining a reduced calorie state, getting the recommended amount of protein, incorporating whole foods, making healthy choices, staying well hydrated and practicing mindfulness when eating..  Additional resources provided today: NA  Recommended Physical Activity Goals  Catherine Coleman has been advised to work up to 150 minutes of moderate intensity aerobic activity a week and strengthening exercises 2-3 times per week for cardiovascular health, weight loss maintenance and preservation of muscle mass.    She has agreed to Continue current level of physical activity    ASSOCIATED CONDITIONS ADDRESSED TODAY  Action/Plan  Type 2 diabetes mellitus with obesity (HCC) Continue to follow up with PCP.  Continue meds as directed  Gastroesophageal reflux disease, unspecified whether esophagitis present Keep appt with GI.  Will monitor for worsening of symptoms.   Generalized obesity  BMI 31.0-31.9,adult       Reviewed labs in chart from 06/01/23 DEXA ordered by PCP Patient recently had x rays of lumbar spine-to reach out to PCP about results. To keep appt with Dr. Mariel Aloe nurse for BP, pulse check and glucometer instructions on 07/07/23    Return in about 4 weeks (around 07/19/2023).Marland Kitchen  She was informed of the importance of frequent follow up visits to maximize her success with intensive lifestyle modifications for her multiple health conditions.   ATTESTASTION STATEMENTS:  Reviewed by clinician on day of visit: allergies, medications, problem list, medical history, surgical history, family history, social history, and previous encounter notes.   Time spent on visit including pre-visit chart review and post-visit care and charting was 30 minutes.    Theodis Sato. Tayvia Faughnan FNP-C

## 2023-06-25 ENCOUNTER — Other Ambulatory Visit (INDEPENDENT_AMBULATORY_CARE_PROVIDER_SITE_OTHER): Payer: Medicare Other

## 2023-06-25 ENCOUNTER — Ambulatory Visit: Payer: Medicare Other | Admitting: Nurse Practitioner

## 2023-06-25 ENCOUNTER — Encounter: Payer: Self-pay | Admitting: Nurse Practitioner

## 2023-06-25 VITALS — BP 126/70 | HR 75 | Ht 65.0 in | Wt 189.8 lb

## 2023-06-25 DIAGNOSIS — G8929 Other chronic pain: Secondary | ICD-10-CM

## 2023-06-25 DIAGNOSIS — K219 Gastro-esophageal reflux disease without esophagitis: Secondary | ICD-10-CM

## 2023-06-25 DIAGNOSIS — R109 Unspecified abdominal pain: Secondary | ICD-10-CM | POA: Diagnosis not present

## 2023-06-25 DIAGNOSIS — R1031 Right lower quadrant pain: Secondary | ICD-10-CM

## 2023-06-25 LAB — BASIC METABOLIC PANEL
BUN: 21 mg/dL (ref 6–23)
CO2: 27 meq/L (ref 19–32)
Calcium: 9.3 mg/dL (ref 8.4–10.5)
Chloride: 101 meq/L (ref 96–112)
Creatinine, Ser: 0.76 mg/dL (ref 0.40–1.20)
GFR: 69.5 mL/min (ref >60.00)
Glucose, Bld: 129 mg/dL — ABNORMAL HIGH (ref 70–99)
Potassium: 4 meq/L (ref 3.5–5.1)
Sodium: 137 meq/L (ref 135–145)

## 2023-06-25 NOTE — Patient Instructions (Addendum)
Your provider has requested that you go to the basement level for lab work before leaving today. Press "B" on the elevator. The lab is located at the first door on the left as you exit the elevator.  Due to recent changes in healthcare laws, you may see the results of your imaging and laboratory studies on MyChart before your provider has had a chance to review them.  We understand that in some cases there may be results that are confusing or concerning to you. Not all laboratory results come back in the same time frame and the provider may be waiting for multiple results in order to interpret others.  Please give [place] 48 hours in order for your provider to thoroughly review all the results before contacting the office for clarification of your results.   You have been scheduled for a CT scan of the abdomen and pelvis at [facility]. You are scheduled on 07/02/2023 at 11:30am. You should arrive 2 hours prior to your appointment time for registration.  Please follow the written instructions below on the day of your exam:   You may take any medications as prescribed with a small amount of water, if necessary. If you take any of the following medications: METFORMIN, GLUCOPHAGE, GLUCOVANCE, AVANDAMET, RIOMET, FORTAMET, ACTOPLUS MET, JANUMET, GLUMETZA or METAGLIP, you MAY be asked to HOLD this medication 48 hours AFTER the exam.   The purpose of you drinking the oral contrast is to aid in the visualization of your intestinal tract. The contrast solution may cause some diarrhea. Depending on your individual set of symptoms, you may also receive an intravenous injection of x-ray contrast/dye. Plan on being at [facility] for 45 minutes or longer, depending on the type of exam you are having performed.   If you have any questions regarding your exam or if you need to reschedule, you may call [facility] at [phone] between the hours of 8:00 am and 5:00 pm, Monday-Friday.      Call us after stopping the ozempic if still having GERD symptoms.  Continue your prilosec 40mg  daily before breakfast.   Increase your pepcid 40mg  to daily at bedtime.  I appreciate the opportunity to care for you. Willette Cluster, NP-

## 2023-06-25 NOTE — Progress Notes (Signed)
Brief Narrative 89 y.o. yo female, new to the practice, referred by her PCP for GERD. She has a past medical history not limited to remote colon polyps, diverticulosis , reported diverticulitis , GERD , cholecystectomy , breast cancer, obesity, and glaucoma  ASSESSMENT    History of chronic GERD manifested as cough leading to small volume emesis / regurgitation. Asymptomatic on low dose PPI for years,  now recurrent intermittent symptoms .  GERD possibly exacerbated by Ozempic. Symptoms have improved but not resolved after increase in PPI dose. Also, with increasing need for famotidine at bedtime   Vague right mid right abdominal pain / RLL pain.  Details vague. Elastic pants hurt but also has discomfort without them. Exam remarkable for right mid abdominal tenderness / fullness without discrete mass.   DM, on Ozempic  Remote colon polyps.   No polyps on last colonoscopy in 2014 done in [place], [place]  See [facility] for any additional medical history  PLAN    --GERD symptoms tolerable for now. She feels like Ozempic is culprit but wishes to continue it for another month.  She will let us know if GERD symptoms do not resolve after discontinuation of Ozempic.  --Anti-reflux measures discussed.  Especially going to bed on an empty stomach and elevating the HOB or using wedge pillow since symptoms worse at night ---Continue omeprazole 40 mg 30 minutes before breakfast. ---Famotidine 40 mg at bedtime -- Bmet to check renal function and CT AP with contrast to evaluate right mid  / RLQ abdominal pain  -- Follow up with me in January. In meantime will let her know CT scan results.   HPI   Chief complaint :  cough which leads to vomiting.  Right sided abdominal pain  Catherine Coleman says she was diagnosed with reflux many years ago.  Told she had a small hiatal hernia.  Her only GERD symptom is that of cough which can eventually lead to low volume emesis/regurgitation.  Symptoms worse at night when she  lays down she does not get heartburn.  For years she was asymptomatic on low-dose omeprazole and would take famotidine occasionally if needed.  Few months ago she was started on Ozempic and since then the cough is returned.  Omeprazole was increased to 40 mg a day without much improvement.  She has also had an increasing need for bedtime famotidine.  Ozempic was held for couple of weeks and she did note an improvement in the cough.  However she has resumed Ozempic.  She is losing weight and would like to continue Ozempic for maybe another month.  She says that her GERD symptoms are intermittent and tolerable.  No dysphagia.   Catherine Coleman mentions a couple of times that she gets abdominal pain sometimes.  She points to her right mid abdomen.  Elastic pants always cause discomfort in the area .  Also leaning over causes discomfort in the area .  She also sometimes has the discomfort when at rest and not wearing elastic pants.  She cannot really describe the discomfort.  She cannot say for how long the discomfort has been there other than for " a long time" but does feel like it is getting more noticeable.  Pain is not improved with defecation.  She denies constipation or diarrhea.  Labs 06/01/2023 CMP and CBC normal   Labs      Latest Ref Rng & Units 06/01/2023    1:07 PM 01/15/2023    9:35 AM 10/12/2022  12:45 PM  CBC  WBC 4.0 - 10.5 K/uL 4.3  5.5  5.1   Hemoglobin 12.0 - 15.0 g/dL 40.9  81.1  91.4   Hematocrit 36.0 - 46.0 % 41.8  40.1  41.0   Platelets 150.0 - 400.0 K/uL 195.0  236.0  237.0     No results found for: "LIPASE"    Latest Ref Rng & Units 06/01/2023    1:07 PM 01/15/2023    9:35 AM 10/12/2022   12:45 PM  CMP  Glucose 70 - 99 mg/dL 99  98  782   BUN 6 - 23 mg/dL 17  11  21    Creatinine 0.40 - 1.20 mg/dL 9.56  2.13  0.86   Sodium 135 - 145 mEq/L 137  136  135   Potassium 3.5 - 5.1 mEq/L 5.0  4.5  5.0   Chloride 96 - 112 mEq/L 99  99  98   CO2 19 - 32 mEq/L 28  28  28    Calcium 8.4 -  10.5 mg/dL 9.2  9.0  9.5   Total Protein 6.0 - 8.3 g/dL 6.5  6.5  7.0   Total Bilirubin 0.2 - 1.2 mg/dL 0.4  0.5  0.4   Alkaline Phos 39 - 117 U/L 98  91  95   AST 0 - 37 U/L 20  16  18    ALT 0 - 35 U/L 15  13  16      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   Constipation    Decreased hearing    Diverticulitis    Dry eyes    Fatigue    Floaters in visual field    Gall bladder disease    GERD (gastroesophageal reflux disease)    Glaucoma    Hiatal hernia with gastroesophageal reflux    History of blood transfusion    for Knee replacement   Hyperlipidemia, mild 04/02/2016   Hypertension    Hypothyroidism    Joint pain    Migraine aura without headache    Muscle pain    Muscle stiffness    Nasal congestion    Obesity 04/02/2016   Osteoarthritis    Parotiditis 04/21/2016   Pre-diabetes    Vitamin D deficiency 04/02/2016   Past Surgical History:  Procedure Laterality Date   APPENDECTOMY     BREAST LUMPECTOMY WITH RADIOACTIVE SEED LOCALIZATION Left 01/21/2021   Procedure: LEFT BREAST LUMPECTOMY WITH RADIOACTIVE SEED LOCALIZATION;  Surgeon: Abigail Miyamoto, MD;  Location: [facility];  Service: General;  Laterality: Left;   cataract surgery  2008   CHOLECYSTECTOMY  2010   JOINT REPLACEMENT Left    2012   MASTOIDECTOMY     POSTERIOR CERVICAL FUSION/FORAMINOTOMY N/A 04/27/2017   Procedure: CERVICAL FIVE-SIX  OPEN REDUCTION OF FRACTURE, CERVICAL THREE-SEVEN  DORSAL FIXATION AND FUSION;  Surgeon: Ditty, Loura Halt, MD;  Location: [facility] OR;  Service: Neurosurgery;  Laterality: N/A;   SHOULDER ARTHROSCOPY Bilateral prior to 2010   TONSILLECTOMY     TOTAL HIP ARTHROPLASTY Right 03/22/2015   Procedure: RIGHT TOTAL HIP ARTHROPLASTY ANTERIOR APPROACH;  Surgeon: Jodi Geralds, MD;  Location: [facility] OR;  Service: Orthopedics;  Laterality: Right;   TOTAL KNEE ARTHROPLASTY  Bilateral 2007   TUBAL LIGATION     Family History  Problem Relation Age of Onset   Dementia Mother  Hypertension Mother    Obesity Mother    Diabetes Mother    Heart disease Father    Hypertension Father    Stroke Father    Diabetes Father    Hyperlipidemia Father    Alcoholism Father    Obesity Father    Cancer Maternal Grandfather        colon cancer   Arthritis Daughter    Social History   Tobacco Use   Smoking status: Never   Smokeless tobacco: Never   Tobacco comments:    Stopped 50 years ago.   Vaping Use   Vaping status: Never Used  Substance Use Topics   Alcohol use: Yes    Alcohol/week: 0.0 standard drinks of alcohol    Comment: socially 3-4x/wk   Drug use: No   Current Outpatient Medications  Medication Sig Dispense Refill   anastrozole (ARIMIDEX) 1 MG tablet TAKE 1 TABLET BY MOUTH DAILY 100 tablet 2   Biotin 91478 MCG TABS Take 1 tablet by mouth daily.     Blood Glucose Monitoring Suppl DEVI 1 each by Does not apply route in the morning, at noon, and at bedtime. May substitute to any manufacturer covered by patient's insurance. 1 each 0   brimonidine (ALPHAGAN) 0.2 % ophthalmic solution Place 1 drop into both eyes 2 (two) times daily. 5 mL 12   Calcium Carb-Cholecalciferol 600-10 MG-MCG TABS Take 1 tablet by mouth daily.     Glucose Blood (BLOOD GLUCOSE TEST STRIPS) STRP 1 each by In Vitro route in the morning, at noon, and at bedtime. May substitute to any manufacturer covered by patient's insurance. 100 strip 0   Lancet Device MISC 1 each by Does not apply route in the morning, at noon, and at bedtime. May substitute to any manufacturer covered by patient's insurance. 1 each 0   Lancets Misc. MISC 1 each by Does not apply route in the morning, at noon, and at bedtime. May substitute to any manufacturer covered by patient's insurance. 100 each 0   levothyroxine (SYNTHROID) 125 MCG tablet TAKE 1 TABLET BY MOUTH DAILY  BEFORE BREAKFAST 100 tablet 2    losartan (COZAAR) 50 MG tablet TAKE 1 TABLET BY MOUTH DAILY 100 tablet 2   MAGNESIUM-POTASSIUM PO Take 1 tablet by mouth daily.     mirabegron ER (MYRBETRIQ) 50 MG TB24 tablet Take 1 tablet (50 mg total) by mouth daily. 90 tablet 1   Multiple Vitamins-Minerals (MULTIVITAMIN WITH MINERALS) tablet Take 1 tablet by mouth daily.     Multiple Vitamins-Minerals (PRESERVISION AREDS PO) Take 1 tablet by mouth daily.     omeprazole (PRILOSEC) 40 MG capsule Take 1 capsule (40 mg total) by mouth daily. 30 capsule 0   rosuvastatin (CRESTOR) 5 MG tablet TAKE 1 TABLET BY MOUTH DAILY 100 tablet 2   Semaglutide,0.25 or 0.5MG /DOS, (OZEMPIC, 0.25 OR 0.5 MG/DOSE,) 2 MG/3ML SOPN Inject 0.25 mg into the skin once a week. 3 mL 2   Vitamin D, Cholecalciferol, 25 MCG (1000 UT) CAPS Take 1 capsule by mouth daily.     famotidine (PEPCID) 40 MG tablet TAKE 1 TABLET BY MOUTH AT  BEDTIME AS NEEDED FOR HEARTBURN  OR INDIGESTION (Patient not taking: Reported on 06/08/2023) 100 tablet 2   No current facility-administered medications for this visit.   No Known Allergies   Review of Systems: Positive for decreased urinary flow, positive for overactive bladder. All other systems reviewed and negative except where noted in HPI.   Wt Readings from Last 3  Encounters:  06/25/23 189 lb 12.8 oz (86.1 kg)  06/21/23 189 lb (85.7 kg)  06/08/23 195 lb (88.5 kg)    Physical Exam:  BP 126/70   Pulse 75   Ht 5\' 5"  (1.651 m)   Wt 189 lb 12.8 oz (86.1 kg)   SpO2 97%   BMI 31.58 kg/m  Constitutional:  Pleasant, generally well appearing female in no acute distress. Psychiatric:  Normal mood and affect. Behavior is normal. EENT: Pupils normal.  Conjunctivae are normal. No scleral icterus. Neck supple.  Cardiovascular: Normal rate, regular rhythm.  Pulmonary/chest: Effort normal and breath sounds normal. No wheezing, rales or rhonchi. Abdominal: Soft, nondistended, mild right mid abdominal tenderness. Large area of fullness in  right mid abdomen. No discrete mass. Umbilical hernia. Bowel sounds active throughout. There are no masses palpable. No hepatomegaly. Neurological: Alert and oriented to person place and time.   Willette Cluster, NP  06/25/2023, 1:40 PM  Cc:  Referring Provider Bradd Canary, MD

## 2023-06-30 ENCOUNTER — Other Ambulatory Visit: Payer: Self-pay | Admitting: Family Medicine

## 2023-06-30 DIAGNOSIS — M1990 Unspecified osteoarthritis, unspecified site: Secondary | ICD-10-CM

## 2023-07-02 ENCOUNTER — Ambulatory Visit (HOSPITAL_BASED_OUTPATIENT_CLINIC_OR_DEPARTMENT_OTHER)
Admission: RE | Admit: 2023-07-02 | Discharge: 2023-07-02 | Disposition: A | Discharge status: Home / Self Care | Payer: Medicare Other | Source: Ambulatory Visit | Attending: Nurse Practitioner | Admitting: Nurse Practitioner

## 2023-07-02 ENCOUNTER — Encounter (HOSPITAL_BASED_OUTPATIENT_CLINIC_OR_DEPARTMENT_OTHER): Payer: Self-pay

## 2023-07-02 DIAGNOSIS — R1031 Right lower quadrant pain: Secondary | ICD-10-CM | POA: Insufficient documentation

## 2023-07-02 DIAGNOSIS — R109 Unspecified abdominal pain: Secondary | ICD-10-CM

## 2023-07-02 DIAGNOSIS — K429 Umbilical hernia without obstruction or gangrene: Secondary | ICD-10-CM | POA: Diagnosis not present

## 2023-07-02 DIAGNOSIS — G8929 Other chronic pain: Secondary | ICD-10-CM | POA: Diagnosis not present

## 2023-07-02 DIAGNOSIS — K5750 Diverticulosis of both small and large intestine without perforation or abscess without bleeding: Secondary | ICD-10-CM | POA: Diagnosis not present

## 2023-07-02 MED ORDER — BARIUM SULFATE 2 % PO SUSP
450.0000 mL | Freq: Once | ORAL | Status: AC
  Administered 2023-07-02: 900 mL via ORAL

## 2023-07-02 MED ORDER — IOHEXOL 300 MG/ML  SOLN
80.0000 mL | Freq: Once | INTRAMUSCULAR | Status: AC | PRN
  Administered 2023-07-02: 80 mL via INTRAVENOUS

## 2023-07-07 ENCOUNTER — Ambulatory Visit (INDEPENDENT_AMBULATORY_CARE_PROVIDER_SITE_OTHER): Payer: Medicare Other

## 2023-07-07 DIAGNOSIS — E1169 Type 2 diabetes mellitus with other specified complication: Secondary | ICD-10-CM

## 2023-07-07 DIAGNOSIS — I10 Essential (primary) hypertension: Secondary | ICD-10-CM

## 2023-07-07 DIAGNOSIS — E669 Obesity, unspecified: Secondary | ICD-10-CM | POA: Diagnosis not present

## 2023-07-07 NOTE — Progress Notes (Signed)
Pt here is for educate her on how to use BP and check glucometer. interim nurse visit per Dr.Blyth. Pt was educate how to check blood sugar and BP.  Pt stated not sure she wanted to do it but advised she can discuss with Dr.Blyth at her next visit.

## 2023-07-08 NOTE — Progress Notes (Signed)
Agree with assessment/plan.  Raj Florestine Carmical, MD [facility] [phone]  

## 2023-07-12 ENCOUNTER — Telehealth: Payer: Self-pay | Admitting: Family Medicine

## 2023-07-12 MED ORDER — LOSARTAN POTASSIUM 50 MG PO TABS: 50.0000 mg | ORAL_TABLET | Freq: Every day | ORAL | 1 refills | Status: DC

## 2023-07-12 NOTE — Telephone Encounter (Signed)
Patient called and would like a med refill on losartan (COZAAR) 50 MG tablet   Preferred pharmacy  [facility]  Patient is almost out of medication.

## 2023-07-12 NOTE — Telephone Encounter (Signed)
No answer/ no voicemail. Rx sent over to [facility]

## 2023-07-16 ENCOUNTER — Telehealth (HOSPITAL_BASED_OUTPATIENT_CLINIC_OR_DEPARTMENT_OTHER): Payer: Self-pay

## 2023-07-20 ENCOUNTER — Encounter: Payer: Self-pay | Admitting: Nurse Practitioner

## 2023-07-20 ENCOUNTER — Ambulatory Visit (INDEPENDENT_AMBULATORY_CARE_PROVIDER_SITE_OTHER): Payer: Medicare Other | Admitting: Nurse Practitioner

## 2023-07-20 ENCOUNTER — Other Ambulatory Visit: Payer: Self-pay | Admitting: Family Medicine

## 2023-07-20 VITALS — BP 126/76 | HR 56 | Temp 97.7°F | Ht 65.0 in | Wt 188.0 lb

## 2023-07-20 DIAGNOSIS — E669 Obesity, unspecified: Secondary | ICD-10-CM

## 2023-07-20 DIAGNOSIS — Z6831 Body mass index (BMI) 31.0-31.9, adult: Secondary | ICD-10-CM | POA: Diagnosis not present

## 2023-07-20 DIAGNOSIS — M25551 Pain in right hip: Secondary | ICD-10-CM

## 2023-07-20 DIAGNOSIS — Z7985 Long-term (current) use of injectable non-insulin antidiabetic drugs: Secondary | ICD-10-CM | POA: Diagnosis not present

## 2023-07-20 DIAGNOSIS — E1169 Type 2 diabetes mellitus with other specified complication: Secondary | ICD-10-CM | POA: Diagnosis not present

## 2023-07-20 NOTE — Progress Notes (Signed)
Office: [phone]  /  Fax: [phone]  WEIGHT SUMMARY AND BIOMETRICS  Weight Lost Since Last Visit: 1lb  Weight Gained Since Last Visit: 0lb   Vitals Temp: 97.7 F (36.5 C) BP: 126/76 Pulse Rate: (!) 56 SpO2: 95 %   Anthropometric Measurements Height: 5\' 5"  (1.651 m) Weight: 188 lb (85.3 kg) BMI (Calculated): 31.28 Weight at Last Visit: 189lb Weight Lost Since Last Visit: 1lb Weight Gained Since Last Visit: 0lb Starting Weight: 211lb Total Weight Loss (lbs): 21 lb (9.526 kg)   Body Composition  Body Fat %: 45.4 % Fat Mass (lbs): 85.4 lbs Muscle Mass (lbs): 97.6 lbs Total Body Water (lbs): 72 lbs Visceral Fat Rating : 15   Other Clinical Data Fasting: Yes Labs: No Today's Visit #: 12 Starting Date: 10/26/22     HPI  Chief Complaint: OBESITY  Arvie is here to discuss her progress with her obesity treatment plan. She is on the the Category 2 Plan and states she is following her eating plan approximately 0 % of the time. She states she is exercising 60-90 minutes 3-5 days per week.   Interval History:  Since last office visit she has lost 1 pound.  She is eating 2 meals and 3-4 snacks daily. She snacks on lunch meat with cheese or crackers.  She is drinking water and wine.     Pharmacotherapy for weight loss: She is not currently taking medications  for medical weight loss.     Pharmacotherapy for DMT2:  She is currently taking Ozempic 0.25mg  (prescribed last by PCP).  Reports occ side effects of GERD.  Denies vomiting.  Saw GI last on 06/25/23 and has a follow up appt on 10/06/23. Had CT abd pelvis on 07/02/23.   Last A1c was 5.9 She is not checking BS at home.   Episodes of hypoglycemia: no On ARB and statin.  Last eye exam:  2024 Struggles with polyphagia and cravings.   Lab Results  Component Value Date   HGBA1C 5.9 06/01/2023   HGBA1C 6.0 01/15/2023   HGBA1C 6.5 10/12/2022   Lab Results  Component Value Date   LDLCALC 61 06/01/2023    CREATININE 0.76 06/25/2023     PHYSICAL EXAM:  Blood pressure 126/76, pulse (!) 56, temperature 97.7 F (36.5 C), height 5\' 5"  (1.651 m), weight 188 lb (85.3 kg), SpO2 95%. Body mass index is 31.28 kg/m.  General: She is overweight, cooperative, alert, well developed, and in no acute distress. PSYCH: Has normal mood, affect and thought process.   Extremities: No edema.  Neurologic: No gross sensory or motor deficits. No tremors or fasciculations noted.    DIAGNOSTIC DATA REVIEWED:  BMET    Component Value Date/Time   NA 137 06/25/2023 1445   NA 137 09/28/2018 1147   K 4.0 06/25/2023 1445   CL 101 06/25/2023 1445   CO2 27 06/25/2023 1445   GLUCOSE 129 (H) 06/25/2023 1445   BUN 21 06/25/2023 1445   BUN 9 09/28/2018 1147   CREATININE 0.76 06/25/2023 1445   CREATININE 0.76 01/02/2022 1331   CALCIUM 9.3 06/25/2023 1445   GFRNONAA >60 03/02/2022 1307   GFRNONAA >60 01/02/2022 1331   GFRAA 94 09/28/2018 1147   Lab Results  Component Value Date   HGBA1C 5.9 06/01/2023   HGBA1C 6.4 10/18/2017   Lab Results  Component Value Date   INSULIN 7.9 10/26/2022   INSULIN 15.5 05/30/2018   Lab Results  Component Value Date   TSH 2.10 06/01/2023  CBC    Component Value Date/Time   WBC 4.3 06/01/2023 1307   RBC 4.35 06/01/2023 1307   HGB 13.8 06/01/2023 1307   HGB 13.0 01/02/2022 1331   HCT 41.8 06/01/2023 1307   PLT 195.0 06/01/2023 1307   PLT 197 01/02/2022 1331   MCV 96.2 06/01/2023 1307   MCH 32.3 03/02/2022 1307   MCHC 33.1 06/01/2023 1307   RDW 13.7 06/01/2023 1307   Iron Studies No results found for: "IRON", "TIBC", "FERRITIN", "IRONPCTSAT" Lipid Panel     Component Value Date/Time   CHOL 149 06/01/2023 1307   CHOL 226 (H) 09/28/2018 1147   TRIG 107.0 06/01/2023 1307   HDL 66.20 06/01/2023 1307   HDL 60 09/28/2018 1147   CHOLHDL 2 06/01/2023 1307   VLDL 21.4 06/01/2023 1307   LDLCALC 61 06/01/2023 1307   LDLCALC 134 (H) 09/28/2018 1147   LDLDIRECT  138.0 05/15/2019 1022   Hepatic Function Panel     Component Value Date/Time   PROT 6.5 06/01/2023 1307   PROT 6.7 09/28/2018 1147   ALBUMIN 4.1 06/01/2023 1307   ALBUMIN 4.6 09/28/2018 1147   AST 20 06/01/2023 1307   AST 20 01/02/2022 1331   ALT 15 06/01/2023 1307   ALT 19 01/02/2022 1331   ALKPHOS 98 06/01/2023 1307   BILITOT 0.4 06/01/2023 1307   BILITOT 0.3 01/02/2022 1331      Component Value Date/Time   TSH 2.10 06/01/2023 1307   Nutritional Lab Results  Component Value Date   VD25OH 62.50 01/15/2023   VD25OH 41.92 10/12/2022   VD25OH 34.14 06/04/2022     ASSESSMENT AND PLAN  TREATMENT PLAN FOR OBESITY:  Recommended Dietary Goals  Catherine Coleman is currently in the action stage of change. As such, her goal is to continue weight management plan. She has agreed to the Category 2 Plan.  Behavioral Intervention  We discussed the following Behavioral Modification Strategies today: increasing lean protein intake to established goals, increasing vegetables, increasing fiber rich foods, avoiding skipping meals, increasing water intake , keeping healthy foods at home, identifying sources and decreasing liquid calories, and continue to work on maintaining a reduced calorie state, getting the recommended amount of protein, incorporating whole foods, making healthy choices, staying well hydrated and practicing mindfulness when eating..  Additional resources provided today: NA  Recommended Physical Activity Goals  Willma has been advised to work up to 150 minutes of moderate intensity aerobic activity a week and strengthening exercises 2-3 times per week for cardiovascular health, weight loss maintenance and preservation of muscle mass.   She has agreed to Continue current level of physical activity    ASSOCIATED CONDITIONS ADDRESSED TODAY  Action/Plan  Type 2 diabetes mellitus with obesity (HCC) Continue to follow up with PCP. Continue meds as directed  Generalized  obesity  BMI 31.0-31.9,adult     Keep appts with GI, cardiology and PCP.   To schedule CT hip and DEXA.   To call GI to discuss CT abd pelvis results.     Return in about 8 weeks (around 09/14/2023).Marland Kitchen She was informed of the importance of frequent follow up visits to maximize her success with intensive lifestyle modifications for her multiple health conditions.   ATTESTASTION STATEMENTS:  Reviewed by clinician on day of visit: allergies, medications, problem list, medical history, surgical history, family history, social history, and previous encounter notes.   Time spent on visit including pre-visit chart review and post-visit care and charting was 30 minutes.    Theodis Sato. Amandeep Hogston  FNP-C

## 2023-07-26 DIAGNOSIS — H353211 Exudative age-related macular degeneration, right eye, with active choroidal neovascularization: Secondary | ICD-10-CM | POA: Diagnosis not present

## 2023-07-29 ENCOUNTER — Other Ambulatory Visit (HOSPITAL_BASED_OUTPATIENT_CLINIC_OR_DEPARTMENT_OTHER): Payer: Medicare Other

## 2023-07-29 ENCOUNTER — Ambulatory Visit (HOSPITAL_BASED_OUTPATIENT_CLINIC_OR_DEPARTMENT_OTHER)
Admission: RE | Admit: 2023-07-29 | Discharge: 2023-07-29 | Disposition: A | Discharge status: Home / Self Care | Payer: Medicare Other | Source: Ambulatory Visit | Attending: Family Medicine | Admitting: Family Medicine

## 2023-07-29 DIAGNOSIS — Z780 Asymptomatic menopausal state: Secondary | ICD-10-CM | POA: Insufficient documentation

## 2023-07-29 DIAGNOSIS — E2839 Other primary ovarian failure: Secondary | ICD-10-CM | POA: Insufficient documentation

## 2023-07-29 DIAGNOSIS — M25551 Pain in right hip: Secondary | ICD-10-CM

## 2023-07-29 DIAGNOSIS — I739 Peripheral vascular disease, unspecified: Secondary | ICD-10-CM | POA: Diagnosis not present

## 2023-07-29 DIAGNOSIS — Z96641 Presence of right artificial hip joint: Secondary | ICD-10-CM | POA: Diagnosis not present

## 2023-07-29 DIAGNOSIS — M810 Age-related osteoporosis without current pathological fracture: Secondary | ICD-10-CM | POA: Diagnosis not present

## 2023-07-29 DIAGNOSIS — M1611 Unilateral primary osteoarthritis, right hip: Secondary | ICD-10-CM | POA: Diagnosis not present

## 2023-07-30 ENCOUNTER — Other Ambulatory Visit: Payer: Self-pay | Admitting: Family Medicine

## 2023-07-30 DIAGNOSIS — K219 Gastro-esophageal reflux disease without esophagitis: Secondary | ICD-10-CM

## 2023-08-02 MED ORDER — OMEPRAZOLE 40 MG PO CPDR: 40.0000 mg | DELAYED_RELEASE_CAPSULE | Freq: Every day | ORAL | 1 refills | Status: DC

## 2023-08-10 ENCOUNTER — Ambulatory Visit: Payer: Medicare Other | Attending: Cardiovascular Disease | Admitting: Cardiovascular Disease

## 2023-08-10 ENCOUNTER — Encounter: Payer: Self-pay | Admitting: Cardiovascular Disease

## 2023-08-10 VITALS — BP 138/80 | HR 61 | Ht 65.0 in | Wt 193.0 lb

## 2023-08-10 DIAGNOSIS — I7121 Aneurysm of the ascending aorta, without rupture: Secondary | ICD-10-CM

## 2023-08-10 DIAGNOSIS — R7303 Prediabetes: Secondary | ICD-10-CM

## 2023-08-10 DIAGNOSIS — I700 Atherosclerosis of aorta: Secondary | ICD-10-CM | POA: Diagnosis not present

## 2023-08-10 DIAGNOSIS — I10 Essential (primary) hypertension: Secondary | ICD-10-CM

## 2023-08-10 DIAGNOSIS — I452 Bifascicular block: Secondary | ICD-10-CM | POA: Diagnosis not present

## 2023-08-10 DIAGNOSIS — E7800 Pure hypercholesterolemia, unspecified: Secondary | ICD-10-CM

## 2023-08-10 NOTE — Progress Notes (Signed)
Cardiology Office Note:    Date:  08/10/2023   ID:  Catherine Coleman, DOB May 15, 1932, MRN [id number]  PCP:  Bradd Canary, MD   [facility] Providers Cardiologist:  Thurmon Fair, MD     Referring MD: Bradd Canary, MD   No chief complaint on file.    History of Present Illness:    Catherine Coleman is a 89 y.o. female with a hx of hypercholesterolemia, hypertension, prediabetes, small-moderate-severe aortic aneurysm (4.0 cm 2018, 4.3 cm 2023), aortic atherosclerosis.  The patient specifically denies any chest pain at rest or with exertion, dyspnea at rest or with exertion, orthopnea, paroxysmal nocturnal dyspnea, syncope, palpitations, focal neurological deficits, intermittent claudication, lower extremity edema, unexplained weight gain, cough, hemoptysis or wheezing.  She has not had any new falls  She resides at [facility], in independent living.  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   Constipation    Decreased hearing    Diabetes (HCC)    Diverticulitis    Dry eyes    Fatigue    Floaters in visual field    Gall bladder disease    GERD (gastroesophageal reflux disease)    Glaucoma    Hiatal hernia with gastroesophageal reflux    History of blood transfusion    for Knee replacement   Hyperlipidemia, mild 04/02/2016   Hypertension    Hypothyroidism    Joint pain    Migraine aura without headache    Muscle pain    Muscle stiffness    Nasal congestion    Obesity 04/02/2016   Osteoarthritis    Parotiditis 04/21/2016   Pre-diabetes    Vitamin D deficiency 04/02/2016    Past Surgical History:  Procedure Laterality Date   APPENDECTOMY     BREAST LUMPECTOMY WITH RADIOACTIVE SEED LOCALIZATION Left 01/21/2021   Procedure: LEFT BREAST LUMPECTOMY WITH RADIOACTIVE SEED LOCALIZATION;  Surgeon: Abigail Miyamoto, MD;  Location: [facility];  Service: General;  Laterality: Left;   cataract surgery  2008   CHOLECYSTECTOMY  2010   JOINT REPLACEMENT Left    2012   MASTOIDECTOMY     POSTERIOR CERVICAL FUSION/FORAMINOTOMY N/A 04/27/2017   Procedure: CERVICAL FIVE-SIX  OPEN REDUCTION OF FRACTURE, CERVICAL THREE-SEVEN  DORSAL FIXATION AND FUSION;  Surgeon: Ditty, Loura Halt, MD;  Location: [facility] OR;  Service: Neurosurgery;  Laterality: N/A;   SHOULDER ARTHROSCOPY Bilateral prior to 2010   TONSILLECTOMY     TOTAL HIP ARTHROPLASTY Right 03/22/2015   Procedure: RIGHT TOTAL HIP ARTHROPLASTY ANTERIOR APPROACH;  Surgeon: Jodi Geralds, MD;  Location: [facility] OR;  Service: Orthopedics;  Laterality: Right;   TOTAL KNEE ARTHROPLASTY Bilateral 2007   TUBAL LIGATION      Current Medications: Current Meds  Medication Sig   anastrozole (ARIMIDEX) 1 MG tablet TAKE 1 TABLET BY MOUTH DAILY   Biotin 52841 MCG TABS Take 1 tablet by mouth daily.   Blood Glucose Monitoring Suppl DEVI 1 each by Does not apply route in the morning, at noon, and at bedtime. May substitute to any manufacturer covered by patient's insurance.   brimonidine (ALPHAGAN) 0.2 % ophthalmic solution Place 1 drop into both eyes 2 (two) times daily.   Calcium Carb-Cholecalciferol 600-10 MG-MCG TABS Take 1 tablet by mouth daily.   famotidine (PEPCID) 40 MG tablet TAKE 1 TABLET BY MOUTH AT  BEDTIME AS NEEDED  FOR HEARTBURN  OR INDIGESTION (Patient taking differently: Take 40 mg by mouth at bedtime.)   latanoprost (XALATAN) 0.005 % ophthalmic solution Place 1 drop into both eyes at bedtime.   levothyroxine (SYNTHROID) 125 MCG tablet TAKE 1 TABLET BY MOUTH DAILY  BEFORE BREAKFAST   losartan (COZAAR) 50 MG tablet Take 1 tablet (50 mg total) by mouth daily.   MAGNESIUM-POTASSIUM PO Take 1 tablet by mouth daily.   mirabegron ER (MYRBETRIQ) 50 MG TB24 tablet Take 1 tablet (50 mg total) by mouth daily.   Multiple Vitamins-Minerals (PRESERVISION AREDS PO) Take 1 tablet by mouth daily.    omeprazole (PRILOSEC) 40 MG capsule Take 1 capsule (40 mg total) by mouth daily.   rosuvastatin (CRESTOR) 5 MG tablet TAKE 1 TABLET BY MOUTH DAILY   Semaglutide,0.25 or 0.5MG /DOS, (OZEMPIC, 0.25 OR 0.5 MG/DOSE,) 2 MG/3ML SOPN Inject 0.25 mg into the skin once a week.   Vitamin D, Cholecalciferol, 25 MCG (1000 UT) CAPS Take 1 capsule by mouth daily.     Allergies:   Patient has no known allergies.   Social History   Socioeconomic History   Marital status: Widowed    Spouse name: Not on file   Number of children: Not on file   Years of education: Not on file   Highest education level: Not on file  Occupational History   Occupation: Retired  Tobacco Use   Smoking status: Never   Smokeless tobacco: Never   Tobacco comments:    Stopped 50 years ago.   Vaping Use   Vaping status: Never Used  Substance and Sexual Activity   Alcohol use: Yes    Alcohol/week: 0.0 standard drinks of alcohol    Comment: socially 3-4x/wk   Drug use: No   Sexual activity: Not Currently  Other Topics Concern   Not on file  Social History Narrative   [facility], widowed 7 years, no dietary restrictions. Retired from neuropsychiatry work.    Social Determinants of Health   Financial Resource Strain: Low Risk  (10/21/2022)   Overall Financial Resource Strain (CARDIA)    Difficulty of Paying Living Expenses: Not hard at all  Food Insecurity: No Food Insecurity (10/21/2022)   Hunger Vital Sign    Worried About Running Out of Food in the Last Year: Never true    Ran Out of Food in the Last Year: Never true  Transportation Needs: No Transportation Needs (10/21/2022)   PRAPARE - Administrator, Civil Service (Medical): No    Lack of Transportation (Non-Medical): No  Physical Activity: Sufficiently Active (10/21/2022)   Exercise Vital Sign    Days of Exercise per Week: 3 days    Minutes of Exercise per Session: 60 min  Stress: No Stress Concern Present (10/21/2022)   [employer] of  Occupational Health - Occupational Stress Questionnaire    Feeling of Stress : Not at all  Social Connections: Moderately Isolated (10/21/2022)   Social Connection and Isolation Panel [NHANES]    Frequency of Communication with Friends and Family: More than three times a week    Frequency of Social Gatherings with Friends and Family: More than three times a week    Attends Religious Services: Never    Database administrator or Organizations: Yes    Attends Banker Meetings: 1 to 4 times per year    Marital Status: Widowed     Family History: The patient's family history includes Alcoholism in her father; Arthritis in her  daughter; Cancer in her maternal grandfather; Dementia in her mother; Diabetes in her father and mother; Heart disease in her father; Hyperlipidemia in her father; Hypertension in her father and mother; Obesity in her father and mother; Stroke in her father.  ROS:   Please see the history of present illness.     All other systems reviewed and are negative.  EKGs/Labs/Other Studies Reviewed:    The following studies were reviewed today: CT chest without contrast 03/02/2022 IMPRESSION: Mildly displaced fractures of the anterolateral LEFT sixth through ninth ribs.   Aneurysmal dilatation ascending thoracic aorta, 4.3 cm transverse; Recommend annual imaging followup by CTA or MRA. This recommendation follows 2010 ACCF/AHA/AATS/ACR/ASA/SCA/SCAI/SIR/STS/SVM Guidelines for the Diagnosis and Management of Patients with Thoracic Aortic Disease. Circulation. 2010; 121: W098-J191. Aortic aneurysm NOS (ICD10-I71.9)   Scattered atherosclerotic calcifications including coronary arteries.    EKG:    EKG Interpretation Date/Time:  Tuesday August 10 2023 13:27:46 EST Ventricular Rate:  61 PR Interval:  172 QRS Duration:  130 QT Interval:  478 QTC Calculation: 481 R Axis:   -43  Text Interpretation: Normal sinus rhythm  Right bundle branch block Left axis  deviation Left ventricular hypertrophy with QRS widening ( R in aVL , Cornell product ) Nonspecific T wave abnormality When compared with ECG of 17-Jan-2021 10:31,  No significant change since last tracing  Confirmed by Dudley Mages [phone]) on 08/10/2023 1:36:31 PM         Recent Labs: 10/12/2022: Magnesium 2.3 06/01/2023: ALT 15; Hemoglobin 13.8; Platelets 195.0; TSH 2.10 06/25/2023: BUN 21; Creatinine, Ser 0.76; Potassium 4.0; Sodium 137  Recent Lipid Panel    Component Value Date/Time   CHOL 149 06/01/2023 1307   CHOL 226 (H) 09/28/2018 1147   TRIG 107.0 06/01/2023 1307   HDL 66.20 06/01/2023 1307   HDL 60 09/28/2018 1147   CHOLHDL 2 06/01/2023 1307   VLDL 21.4 06/01/2023 1307   LDLCALC 61 06/01/2023 1307   LDLCALC 134 (H) 09/28/2018 1147   LDLDIRECT 138.0 05/15/2019 1022     Risk Assessment/Calculations:           Physical Exam:    VS:  BP 138/80 (BP Location: Left Arm, Patient Position: Sitting, Cuff Size: Large)   Pulse 61   Ht 5\' 5"  (1.651 m)   Wt 193 lb (87.5 kg)   SpO2 93%   BMI 32.12 kg/m     Wt Readings from Last 3 Encounters:  08/10/23 193 lb (87.5 kg)  07/20/23 188 lb (85.3 kg)  06/25/23 189 lb 12.8 oz (86.1 kg)     GEN: Moderate obesity, well nourished, well developed in no acute distress HEENT: Normal NECK: No JVD; No carotid bruits LYMPHATICS: No lymphadenopathy CARDIAC: Normal S1, widely split S2, RRR, no murmurs, rubs, gallops RESPIRATORY:  Clear to auscultation without rales, wheezing or rhonchi  ABDOMEN: Soft, non-tender, non-distended MUSCULOSKELETAL:  No edema; No deformity  SKIN: Warm and dry NEUROLOGIC:  Alert and oriented x 3 PSYCHIATRIC:  Normal affect   ASSESSMENT:    1. Aneurysm of ascending aorta without rupture (HCC)   2. Primary hypertension   3. Atherosclerosis of aorta (HCC)   4. Pure hypercholesterolemia   5. Bifascicular block   6. Prediabetes    PLAN:    In order of problems listed above:  Asc Ao  Aneurysm: small, slow growth (40 mm in 2018, 43 mm in 2023).  We discussed reevaluation of aortic aneurysm size even though it would be a relatively high bar to recommend  open chest surgery at age almost 76.  She would like to follow-up on this and does think that she would go for surgery if this was recommended.   Aortic atherosclerosis: Incidentally noted in both the thoracic and abdominal aorta on CT study also with coronary calcifications. No clinical evidence of CAD or PAD. HTN: Well-controlled.  ARB use of her choice with her aortic aneurysm. HLP: Excellent lipid profile on current medications continue rosuvastatin. RBBB+LAFB: She has not had any symptoms I will suggest second or third degree AV block and how that would manifest clinically. Has a smart watch that can be used to record ECGs during symptomatic events. PreDM: There has been further improvement in her hemoglobin A1c which is now down to 5.9%.             Medication Adjustments/Labs and Tests Ordered: Current medicines are reviewed at length with the patient today.  Concerns regarding medicines are outlined above.  Orders Placed This Encounter  Procedures   CT ANGIO CHEST AORTA W/CM & OR WO/CM   Basic metabolic panel   EKG 12-Lead   No orders of the defined types were placed in this encounter.   Patient Instructions  Medication Instructions:  No changes *If you need a refill on your cardiac medications before your next appointment, please call your pharmacy*   Lab Work: BMP If you have labs (blood work) drawn today and your tests are completely normal, you will receive your results only by: MyChart Message (if you have MyChart) OR A paper copy in the mail If you have any lab test that is abnormal or we need to change your treatment, we will call you to review the results.   Testing/Procedures: CTA- Aorta- scheduled at [facility]   Follow-Up: At [facility], you and your health needs are  our priority.  As part of our continuing mission to provide you with exceptional heart care, we have created designated Provider Care Teams.  These Care Teams include your primary Cardiologist (physician) and Advanced Practice Providers (APPs -  Physician Assistants and Nurse Practitioners) who all work together to provide you with the care you need, when you need it.  We recommend signing up for the patient portal called "MyChart".  Sign up information is provided on this After Visit Summary.  MyChart is used to connect with patients for Virtual Visits (Telemedicine).  Patients are able to view lab/test results, encounter notes, upcoming appointments, etc.  Non-urgent messages can be sent to your provider as well.   To learn more about what you can do with MyChart, go to [email or web address].    Your next appointment:   1 year(s)  Provider:   Thurmon Fair, MD        Signed, Thurmon Fair, MD  08/10/2023 1:53 PM    [facility]

## 2023-08-10 NOTE — Patient Instructions (Signed)
Medication Instructions:  No changes *If you need a refill on your cardiac medications before your next appointment, please call your pharmacy*   Lab Work: BMP If you have labs (blood work) drawn today and your tests are completely normal, you will receive your results only by: MyChart Message (if you have MyChart) OR A paper copy in the mail If you have any lab test that is abnormal or we need to change your treatment, we will call you to review the results.   Testing/Procedures: CTA- Aorta- scheduled at [facility]   Follow-Up: At [facility], you and your health needs are our priority.  As part of our continuing mission to provide you with exceptional heart care, we have created designated Provider Care Teams.  These Care Teams include your primary Cardiologist (physician) and Advanced Practice Providers (APPs -  Physician Assistants and Nurse Practitioners) who all work together to provide you with the care you need, when you need it.  We recommend signing up for the patient portal called "MyChart".  Sign up information is provided on this After Visit Summary.  MyChart is used to connect with patients for Virtual Visits (Telemedicine).  Patients are able to view lab/test results, encounter notes, upcoming appointments, etc.  Non-urgent messages can be sent to your provider as well.   To learn more about what you can do with MyChart, go to [email or web address].    Your next appointment:   1 year(s)  Provider:   Thurmon Fair, MD

## 2023-08-11 DIAGNOSIS — I712 Thoracic aortic aneurysm, without rupture, unspecified: Secondary | ICD-10-CM | POA: Diagnosis not present

## 2023-08-12 ENCOUNTER — Telehealth: Payer: Self-pay | Admitting: Cardiology

## 2023-08-12 ENCOUNTER — Encounter (HOSPITAL_BASED_OUTPATIENT_CLINIC_OR_DEPARTMENT_OTHER): Payer: Self-pay

## 2023-08-12 ENCOUNTER — Emergency Department (HOSPITAL_BASED_OUTPATIENT_CLINIC_OR_DEPARTMENT_OTHER)
Admission: EM | Admit: 2023-08-12 | Discharge: 2023-08-12 | Disposition: A | Discharge status: Home / Self Care | Payer: Medicare Other | Attending: Emergency Medicine | Admitting: Emergency Medicine

## 2023-08-12 ENCOUNTER — Other Ambulatory Visit: Payer: Self-pay

## 2023-08-12 DIAGNOSIS — E875 Hyperkalemia: Secondary | ICD-10-CM | POA: Insufficient documentation

## 2023-08-12 DIAGNOSIS — R7989 Other specified abnormal findings of blood chemistry: Secondary | ICD-10-CM | POA: Diagnosis present

## 2023-08-12 DIAGNOSIS — I10 Essential (primary) hypertension: Secondary | ICD-10-CM | POA: Diagnosis not present

## 2023-08-12 DIAGNOSIS — R9431 Abnormal electrocardiogram [ECG] [EKG]: Secondary | ICD-10-CM | POA: Diagnosis not present

## 2023-08-12 LAB — CBC WITH DIFFERENTIAL/PLATELET
Abs Immature Granulocytes: 0.01 10*3/uL (ref 0.00–0.07)
Basophils Absolute: 0 10*3/uL (ref 0.0–0.1)
Basophils Relative: 1 %
Eosinophils Absolute: 0.1 10*3/uL (ref 0.0–0.5)
Eosinophils Relative: 3 %
HCT: 39 % (ref 36.0–46.0)
Hemoglobin: 13.2 g/dL (ref 12.0–15.0)
Immature Granulocytes: 0 %
Lymphocytes Relative: 27 %
Lymphs Abs: 1.3 10*3/uL (ref 0.7–4.0)
MCH: 31.9 pg (ref 26.0–34.0)
MCHC: 33.8 g/dL (ref 30.0–36.0)
MCV: 94.2 fL (ref 80.0–100.0)
Monocytes Absolute: 0.5 10*3/uL (ref 0.1–1.0)
Monocytes Relative: 12 %
Neutro Abs: 2.7 10*3/uL (ref 1.7–7.7)
Neutrophils Relative %: 57 %
Platelets: 214 10*3/uL (ref 150–400)
RBC: 4.14 MIL/uL (ref 3.87–5.11)
RDW: 13 % (ref 11.5–15.5)
WBC: 4.7 10*3/uL (ref 4.0–10.5)
nRBC: 0 % (ref 0.0–0.2)

## 2023-08-12 LAB — BASIC METABOLIC PANEL
BUN/Creatinine Ratio: 16 (ref 12–28)
BUN: 14 mg/dL (ref 8–27)
CO2: 23 mmol/L (ref 20–29)
Calcium: 9.5 mg/dL (ref 8.7–10.3)
Chloride: 99 mmol/L (ref 96–106)
Creatinine, Ser: 0.85 mg/dL (ref 0.57–1.00)
Glucose: 101 mg/dL — ABNORMAL HIGH (ref 70–99)
Potassium: 6.1 mmol/L (ref 3.5–5.2)
Sodium: 137 mmol/L (ref 134–144)
eGFR: 65 mL/min/{1.73_m2} (ref >59)

## 2023-08-12 LAB — I-STAT CHEM 8, ED
BUN: 16 mg/dL (ref 8–23)
Calcium, Ion: 1.08 mmol/L — ABNORMAL LOW (ref 1.15–1.40)
Chloride: 99 mmol/L (ref 98–111)
Creatinine, Ser: 0.8 mg/dL (ref 0.44–1.00)
Glucose, Bld: 112 mg/dL — ABNORMAL HIGH (ref 70–99)
HCT: 40 % (ref 36.0–46.0)
Hemoglobin: 13.6 g/dL (ref 12.0–15.0)
Potassium: 4.5 mmol/L (ref 3.5–5.1)
Sodium: 134 mmol/L — ABNORMAL LOW (ref 135–145)
TCO2: 23 mmol/L (ref 22–32)

## 2023-08-12 NOTE — Telephone Encounter (Signed)
Received a call from [facility] this afternoon that patient's K was 6.1 on BMP drawn yesterday.   Per review of the chart, patient was already notified of these results by Laverda Page NP this morning at [phone]. Patient was seen in the [facility] ED this AM. There, K was 4.5 at 0838   Jonita Albee, PA-C 08/12/2023 4:40 PM

## 2023-08-12 NOTE — ED Notes (Signed)
Fall risk armband Fall risk sign on door Patient wearing shoes

## 2023-08-12 NOTE — Discharge Instructions (Addendum)
Stop taking your potassium supplements until you discuss this further with your cardiologist or primary care doctor.  You can continue taking your losartan.  Return to the emergency room if you have any worsening symptoms.

## 2023-08-12 NOTE — ED Triage Notes (Addendum)
Pt presents with complaint of elevated Kt. Blood drawn yesterday at drs office and called this morning K 6.1

## 2023-08-12 NOTE — ED Notes (Signed)
Reviewed discharge instructions. Pt ambulatory at discharge without complaints

## 2023-08-12 NOTE — ED Provider Notes (Signed)
[place] [facility] AT [facility] Provider Note   CSN: [id number] Arrival date & time: 08/12/23  0813     History  Chief Complaint  Patient presents with   Abnormal Lab    Catherine Coleman is a 89 y.o. female.  Patient is a 89 year old female who presents with abnormal blood work.  On chart review, she has a history of hypertension, hyperlipidemia, aortic aneurysm which is under routine monitoring and aortic atherosclerosis.  She saw her cardiologist 2 days ago.  He had ordered blood work for an upcoming CT scan to monitor her aneurysm.  On that blood work, her potassium is noted to be elevated at 6.1.  She was advised to come to the emergency room to have this rechecked.  She denies any symptoms.  No chest pain or shortness of breath.  No weakness or dizziness.  No leg swelling.  She is currently on losartan.  She also says that she has been taking some potassium and magnesium supplements.       Home Medications Prior to Admission medications   Medication Sig Start Date End Date Taking? Authorizing Provider  anastrozole (ARIMIDEX) 1 MG tablet TAKE 1 TABLET BY MOUTH DAILY 04/27/23   Rachel Moulds, MD  Biotin 62130 MCG TABS Take 1 tablet by mouth daily.    [provider]  Blood Glucose Monitoring Suppl DEVI 1 each by Does not apply route in the morning, at noon, and at bedtime. May substitute to any manufacturer covered by patient's insurance. 06/09/23   Bradd Canary, MD  brimonidine (ALPHAGAN) 0.2 % ophthalmic solution Place 1 drop into both eyes 2 (two) times daily. 05/05/17   Angiulli, Mcarthur Rossetti, PA-C  Calcium Carb-Cholecalciferol 600-10 MG-MCG TABS Take 1 tablet by mouth daily.    [provider]  famotidine (PEPCID) 40 MG tablet TAKE 1 TABLET BY MOUTH AT  BEDTIME AS NEEDED FOR HEARTBURN  OR INDIGESTION Patient taking differently: Take 40 mg by mouth at bedtime. 05/24/23   Bradd Canary, MD  latanoprost (XALATAN) 0.005 % ophthalmic  solution Place 1 drop into both eyes at bedtime. 03/03/23   [provider]  levothyroxine (SYNTHROID) 125 MCG tablet TAKE 1 TABLET BY MOUTH DAILY  BEFORE BREAKFAST 08/24/22   Bradd Canary, MD  losartan (COZAAR) 50 MG tablet Take 1 tablet (50 mg total) by mouth daily. 08/02/23   Bradd Canary, MD  MAGNESIUM-POTASSIUM PO Take 1 tablet by mouth daily.    [provider]  mirabegron ER (MYRBETRIQ) 50 MG TB24 tablet Take 1 tablet (50 mg total) by mouth daily. 06/18/23   Bradd Canary, MD  Multiple Vitamins-Minerals (PRESERVISION AREDS PO) Take 1 tablet by mouth daily.    [provider]  omeprazole (PRILOSEC) 40 MG capsule Take 1 capsule (40 mg total) by mouth daily. 08/02/23   Bradd Canary, MD  rosuvastatin (CRESTOR) 5 MG tablet TAKE 1 TABLET BY MOUTH DAILY 12/21/22   Bradd Canary, MD  Semaglutide,0.25 or 0.5MG /DOS, (OZEMPIC, 0.25 OR 0.5 MG/DOSE,) 2 MG/3ML SOPN Inject 0.25 mg into the skin once a week. 06/08/23   Bradd Canary, MD  Vitamin D, Cholecalciferol, 25 MCG (1000 UT) CAPS Take 1 capsule by mouth daily.    [provider]      Allergies    Patient has no known allergies.    Review of Systems   Review of Systems  Constitutional:  Negative for chills, diaphoresis, fatigue and fever.  HENT:  Negative  for congestion, rhinorrhea and sneezing.   Eyes: Negative.   Respiratory:  Negative for cough, chest tightness and shortness of breath.   Cardiovascular:  Negative for chest pain and leg swelling.  Gastrointestinal:  Negative for abdominal pain, blood in stool, diarrhea, nausea and vomiting.  Genitourinary:  Negative for difficulty urinating, flank pain, frequency and hematuria.  Musculoskeletal:  Negative for arthralgias and back pain.  Skin:  Negative for rash.  Neurological:  Negative for dizziness, speech difficulty, weakness, numbness and headaches.    Physical Exam Updated Vital Signs BP (!) 165/97   Pulse 67   Temp 97.6 F (36.4 C)    Resp 18   SpO2 98%  Physical Exam Constitutional:      Appearance: She is well-developed.  HENT:     Head: Normocephalic and atraumatic.  Eyes:     Pupils: Pupils are equal, round, and reactive to light.  Cardiovascular:     Rate and Rhythm: Normal rate and regular rhythm.     Heart sounds: Normal heart sounds.  Pulmonary:     Effort: Pulmonary effort is normal. No respiratory distress.     Breath sounds: Normal breath sounds. No wheezing or rales.  Chest:     Chest wall: No tenderness.  Abdominal:     General: Bowel sounds are normal.     Palpations: Abdomen is soft.     Tenderness: There is no abdominal tenderness. There is no guarding or rebound.  Musculoskeletal:        General: Normal range of motion.     Cervical back: Normal range of motion and neck supple.  Lymphadenopathy:     Cervical: No cervical adenopathy.  Skin:    General: Skin is warm and dry.     Findings: No rash.  Neurological:     Mental Status: She is alert and oriented to person, place, and time.     ED Results / Procedures / Treatments   Labs (all labs ordered are listed, but only abnormal results are displayed) Labs Reviewed  I-STAT CHEM 8, ED - Abnormal; Notable for the following components:      Result Value   Sodium 134 (*)    Glucose, Bld 112 (*)    Calcium, Ion 1.08 (*)    All other components within normal limits  CBC WITH DIFFERENTIAL/PLATELET    EKG EKG Interpretation Date/Time:  Thursday August 12 2023 08:27:05 EST Ventricular Rate:  65 PR Interval:  172 QRS Duration:  149 QT Interval:  475 QTC Calculation: 494 R Axis:   -42  Text Interpretation: Sinus rhythm RBBB and LAFB since last tracing no significant change Confirmed by Rolan Bucco [phone]) on 08/12/2023 8:31:21 AM  Radiology No results found.  Procedures Procedures    Medications Ordered in ED Medications - No data to display  ED Course/ Medical Decision Making/ A&P                                  Medical Decision Making Amount and/or Complexity of Data Reviewed Labs: ordered.   Patient presents with reported hyperkalemia.  EKG does not show any noted changes.  Labs were reviewed.  Her potassium is normal.  Her creatinine is normal.  She is asymptomatic.  She was discharged home in good condition.  She was advised to continue her losartan but not to take her potassium supplements until she discusses this with either her primary care doctor or  cardiologist.  Return precautions were given.  Final Clinical Impression(s) / ED Diagnoses Final diagnoses:  Hyperkalemia    Rx / DC Orders ED Discharge Orders     None         Rolan Bucco, MD 08/12/23 [phone]

## 2023-08-12 NOTE — Telephone Encounter (Signed)
Received sign out from the fellow that patient had a K+ 6.1 on labs drawn yesterday that was called earlier this morning. Other labs were stable. I called an spoke with the patient regarding this and advised to hold her losartan. Given this a holiday leading into the holiday weekend with no options for lab redraw in the clinic until Monday I advised we need to have a recheck draw today. She knows the [facility] ED location and agrees to go for a redraw of labs today.

## 2023-08-20 ENCOUNTER — Ambulatory Visit (HOSPITAL_BASED_OUTPATIENT_CLINIC_OR_DEPARTMENT_OTHER)
Admission: RE | Admit: 2023-08-20 | Discharge: 2023-08-20 | Disposition: A | Discharge status: Home / Self Care | Payer: Medicare Other | Source: Ambulatory Visit | Attending: Cardiovascular Disease | Admitting: Cardiovascular Disease

## 2023-08-20 ENCOUNTER — Encounter (HOSPITAL_BASED_OUTPATIENT_CLINIC_OR_DEPARTMENT_OTHER): Payer: Self-pay

## 2023-08-20 DIAGNOSIS — I7121 Aneurysm of the ascending aorta, without rupture: Secondary | ICD-10-CM | POA: Diagnosis not present

## 2023-08-20 DIAGNOSIS — N281 Cyst of kidney, acquired: Secondary | ICD-10-CM | POA: Diagnosis not present

## 2023-08-20 DIAGNOSIS — K7689 Other specified diseases of liver: Secondary | ICD-10-CM | POA: Diagnosis not present

## 2023-08-20 DIAGNOSIS — I3481 Nonrheumatic mitral (valve) annulus calcification: Secondary | ICD-10-CM | POA: Diagnosis not present

## 2023-08-20 MED ORDER — IOHEXOL 350 MG/ML SOLN
75.0000 mL | Freq: Once | INTRAVENOUS | Status: AC | PRN
  Administered 2023-08-20: 75 mL via INTRAVENOUS

## 2023-08-23 DIAGNOSIS — H353211 Exudative age-related macular degeneration, right eye, with active choroidal neovascularization: Secondary | ICD-10-CM | POA: Diagnosis not present

## 2023-08-27 ENCOUNTER — Other Ambulatory Visit: Payer: Self-pay | Admitting: Family Medicine

## 2023-09-04 ENCOUNTER — Other Ambulatory Visit: Payer: Self-pay | Admitting: Family Medicine

## 2023-09-17 DIAGNOSIS — H04123 Dry eye syndrome of bilateral lacrimal glands: Secondary | ICD-10-CM | POA: Diagnosis not present

## 2023-09-17 DIAGNOSIS — H401412 Capsular glaucoma with pseudoexfoliation of lens, right eye, moderate stage: Secondary | ICD-10-CM | POA: Diagnosis not present

## 2023-09-17 DIAGNOSIS — H401421 Capsular glaucoma with pseudoexfoliation of lens, left eye, mild stage: Secondary | ICD-10-CM | POA: Diagnosis not present

## 2023-09-20 ENCOUNTER — Ambulatory Visit: Payer: Medicare Other | Admitting: Nurse Practitioner

## 2023-09-23 DIAGNOSIS — M545 Low back pain, unspecified: Secondary | ICD-10-CM | POA: Diagnosis not present

## 2023-10-04 DIAGNOSIS — H353211 Exudative age-related macular degeneration, right eye, with active choroidal neovascularization: Secondary | ICD-10-CM | POA: Diagnosis not present

## 2023-10-06 ENCOUNTER — Ambulatory Visit: Payer: Medicare Other | Admitting: Nurse Practitioner

## 2023-10-06 ENCOUNTER — Other Ambulatory Visit: Payer: Self-pay | Admitting: Family Medicine

## 2023-10-06 ENCOUNTER — Telehealth: Payer: Self-pay | Admitting: Emergency Medicine

## 2023-10-06 DIAGNOSIS — K219 Gastro-esophageal reflux disease without esophagitis: Secondary | ICD-10-CM

## 2023-10-06 NOTE — Telephone Encounter (Signed)
Copied from CRM [id number]. Topic: General - Other >> Oct 06, 2023 12:35 PM Adele Barthel wrote: Reason for CRM: Patient has a 4 month follow up with provider on 01/30 and would like to know if labwork will be ordered during the visit and if she can have that performed prior to or the day of her appt. Requests call back, [phone]

## 2023-10-06 NOTE — Telephone Encounter (Signed)
Called and spoke with patient. I was not sure what lab she would need prior to her visit. She said he was just get them drawn on the day of her appointment (10/14/2023) if provider needs them

## 2023-10-07 ENCOUNTER — Encounter: Payer: Self-pay | Admitting: Nurse Practitioner

## 2023-10-07 ENCOUNTER — Ambulatory Visit: Payer: Medicare Other | Admitting: Nurse Practitioner

## 2023-10-07 ENCOUNTER — Other Ambulatory Visit: Payer: Self-pay | Admitting: Family Medicine

## 2023-10-07 VITALS — BP 138/88 | HR 74 | Temp 98.0°F | Ht 65.0 in | Wt 189.0 lb

## 2023-10-07 DIAGNOSIS — E669 Obesity, unspecified: Secondary | ICD-10-CM

## 2023-10-07 DIAGNOSIS — Z7985 Long-term (current) use of injectable non-insulin antidiabetic drugs: Secondary | ICD-10-CM

## 2023-10-07 DIAGNOSIS — Z6831 Body mass index (BMI) 31.0-31.9, adult: Secondary | ICD-10-CM | POA: Diagnosis not present

## 2023-10-07 DIAGNOSIS — E1169 Type 2 diabetes mellitus with other specified complication: Secondary | ICD-10-CM

## 2023-10-07 NOTE — Progress Notes (Signed)
Office: [phone]  /  Fax: [phone]  WEIGHT SUMMARY AND BIOMETRICS  Weight Lost Since Last Visit: 0lb  Weight Gained Since Last Visit: 1lb   Vitals Temp: 98 F (36.7 C) BP: 138/88 Pulse Rate: 74 SpO2: 97 %   Anthropometric Measurements Height: 5\' 5"  (1.651 m) Weight: 189 lb (85.7 kg) BMI (Calculated): 31.45 Weight at Last Visit: 188lb Weight Lost Since Last Visit: 0lb Weight Gained Since Last Visit: 1lb Starting Weight: 211lb Total Weight Loss (lbs): 22 lb (9.979 kg)   Body Composition  Body Fat %: 45.6 % Fat Mass (lbs): 86.4 lbs Muscle Mass (lbs): 98 lbs Total Body Water (lbs): 71 lbs Visceral Fat Rating : 15   Other Clinical Data Fasting: No Labs: No Today's Visit #: 13 Starting Date: 10/26/22     HPI  Chief Complaint: OBESITY  Catherine Coleman is here to discuss her progress with her obesity treatment plan. She is on the the Category 2 Plan and states she is following her eating plan approximately 0 % of the time. She states she is exercising 0 minutes 0 days per week.   Interval History:  Since last office visit on 07/20/23 she has gained 1 pound.  She did well over the holidays.  She is drinking water, wine and rarely caffeine.    Her highest weight was 224 lbs.   Pharmacotherapy for weight loss: She is not currently taking medications  for medical weight loss.     Pharmacotherapy for DMT2:  She is currently taking Ozempic 0.25mg .  Reports side effects of nausea and vomiting once per week.   Last A1c was 5.9 She is not checking BS at home.   Episodes of hypoglycemia: no On ARB and statin.  Last eye exam:  2024 Struggles with polyphagia and cravings.    Lab Results  Component Value Date   HGBA1C 5.9 06/01/2023   HGBA1C 6.0 01/15/2023   HGBA1C 6.5 10/12/2022   Lab Results  Component Value Date   LDLCALC 61 06/01/2023   CREATININE 0.80 08/12/2023      PHYSICAL EXAM:  Blood pressure 138/88, pulse 74, temperature 98 F (36.7 C),  height 5\' 5"  (1.651 m), weight 189 lb (85.7 kg), SpO2 97%. Body mass index is 31.45 kg/m.  General: She is overweight, cooperative, alert, well developed, and in no acute distress. PSYCH: Has normal mood, affect and thought process.   Extremities: No edema.  Neurologic: No gross sensory or motor deficits. No tremors or fasciculations noted.    DIAGNOSTIC DATA REVIEWED:  BMET    Component Value Date/Time   NA 134 (L) 08/12/2023 0838   NA 137 08/11/2023 0906   K 4.5 08/12/2023 0838   CL 99 08/12/2023 0838   CO2 23 08/11/2023 0906   GLUCOSE 112 (H) 08/12/2023 0838   BUN 16 08/12/2023 0838   BUN 14 08/11/2023 0906   CREATININE 0.80 08/12/2023 0838   CREATININE 0.76 01/02/2022 1331   CALCIUM 9.5 08/11/2023 0906   GFRNONAA >60 03/02/2022 1307   GFRNONAA >60 01/02/2022 1331   GFRAA 94 09/28/2018 1147   Lab Results  Component Value Date   HGBA1C 5.9 06/01/2023   HGBA1C 6.4 10/18/2017   Lab Results  Component Value Date   INSULIN 7.9 10/26/2022   INSULIN 15.5 05/30/2018   Lab Results  Component Value Date   TSH 2.10 06/01/2023   CBC    Component Value Date/Time   WBC 4.7 08/12/2023 0829   RBC 4.14 08/12/2023 0829   HGB 13.6  08/12/2023 0838   HGB 13.0 01/02/2022 1331   HCT 40.0 08/12/2023 0838   PLT 214 08/12/2023 0829   PLT 197 01/02/2022 1331   MCV 94.2 08/12/2023 0829   MCH 31.9 08/12/2023 0829   MCHC 33.8 08/12/2023 0829   RDW 13.0 08/12/2023 0829   Iron Studies No results found for: "IRON", "TIBC", "FERRITIN", "IRONPCTSAT" Lipid Panel     Component Value Date/Time   CHOL 149 06/01/2023 1307   CHOL 226 (H) 09/28/2018 1147   TRIG 107.0 06/01/2023 1307   HDL 66.20 06/01/2023 1307   HDL 60 09/28/2018 1147   CHOLHDL 2 06/01/2023 1307   VLDL 21.4 06/01/2023 1307   LDLCALC 61 06/01/2023 1307   LDLCALC 134 (H) 09/28/2018 1147   LDLDIRECT 138.0 05/15/2019 1022   Hepatic Function Panel     Component Value Date/Time   PROT 6.5 06/01/2023 1307   PROT 6.7  09/28/2018 1147   ALBUMIN 4.1 06/01/2023 1307   ALBUMIN 4.6 09/28/2018 1147   AST 20 06/01/2023 1307   AST 20 01/02/2022 1331   ALT 15 06/01/2023 1307   ALT 19 01/02/2022 1331   ALKPHOS 98 06/01/2023 1307   BILITOT 0.4 06/01/2023 1307   BILITOT 0.3 01/02/2022 1331      Component Value Date/Time   TSH 2.10 06/01/2023 1307   Nutritional Lab Results  Component Value Date   VD25OH 62.50 01/15/2023   VD25OH 41.92 10/12/2022   VD25OH 34.14 06/04/2022     ASSESSMENT AND PLAN  TREATMENT PLAN FOR OBESITY:  Recommended Dietary Goals  Catherine Coleman is currently in the action stage of change. As such, her goal is to continue weight management plan. She has agreed to the Category 2 Plan.  Behavioral Intervention  We discussed the following Behavioral Modification Strategies today: increasing lean protein intake to established goals, decreasing simple carbohydrates , increasing vegetables, increasing fiber rich foods, increasing water intake , work on meal planning and preparation, reading food labels , keeping healthy foods at home, planning for success, and continue to work on maintaining a reduced calorie state, getting the recommended amount of protein, incorporating whole foods, making healthy choices, staying well hydrated and practicing mindfulness when eating..  Additional resources provided today: NA  Recommended Physical Activity Goals  Catherine Coleman has been advised to work up to 150 minutes of moderate intensity aerobic activity a week and strengthening exercises 2-3 times per week for cardiovascular health, weight loss maintenance and preservation of muscle mass.   She has agreed to Think about enjoyable ways to increase daily physical activity and overcoming barriers to exercise, Increase physical activity in their day and reduce sedentary time (increase NEAT)., and Work on scheduling and tracking physical activity.    ASSOCIATED CONDITIONS ADDRESSED TODAY  Action/Plan  Type 2  diabetes mellitus with obesity (HCC) Stop Ozempic due to side effects-to call and let me know if symptoms worsen or persist Keep appt with PCP for follow up and labs  Generalized obesity  BMI 31.0-31.9,adult       Seeing PCP next week for follow up and labs.    Return in about 4 weeks (around 11/04/2023).Marland Kitchen She was informed of the importance of frequent follow up visits to maximize her success with intensive lifestyle modifications for her multiple health conditions.   ATTESTASTION STATEMENTS:  Reviewed by clinician on day of visit: allergies, medications, problem list, medical history, surgical history, family history, social history, and previous encounter notes.   Time spent on visit including pre-visit chart review and post-visit  care and charting was 30 minutes.    Theodis Sato. Catherine Stith FNP-C

## 2023-10-13 NOTE — Assessment & Plan Note (Signed)
Encourage heart healthy diet such as MIND or DASH diet, increase exercise, avoid trans fats, simple carbohydrates and processed foods, consider a krill or fish or flaxseed oil cap daily.  hgba1c acceptable, minimize simple carbs. Increase exercise as tolerated. Continue current meds

## 2023-10-14 ENCOUNTER — Encounter: Payer: Self-pay | Admitting: Family Medicine

## 2023-10-14 ENCOUNTER — Ambulatory Visit: Payer: Medicare Other | Admitting: Family Medicine

## 2023-10-14 VITALS — BP 152/90 | HR 72 | Temp 97.6°F | Resp 16 | Ht 65.0 in | Wt 195.4 lb

## 2023-10-14 DIAGNOSIS — R252 Cramp and spasm: Secondary | ICD-10-CM | POA: Diagnosis not present

## 2023-10-14 DIAGNOSIS — E669 Obesity, unspecified: Secondary | ICD-10-CM | POA: Diagnosis not present

## 2023-10-14 DIAGNOSIS — Z23 Encounter for immunization: Secondary | ICD-10-CM

## 2023-10-14 DIAGNOSIS — I10 Essential (primary) hypertension: Secondary | ICD-10-CM

## 2023-10-14 DIAGNOSIS — E559 Vitamin D deficiency, unspecified: Secondary | ICD-10-CM | POA: Diagnosis not present

## 2023-10-14 DIAGNOSIS — E785 Hyperlipidemia, unspecified: Secondary | ICD-10-CM

## 2023-10-14 DIAGNOSIS — E038 Other specified hypothyroidism: Secondary | ICD-10-CM | POA: Diagnosis not present

## 2023-10-14 DIAGNOSIS — E1169 Type 2 diabetes mellitus with other specified complication: Secondary | ICD-10-CM | POA: Diagnosis not present

## 2023-10-14 DIAGNOSIS — E538 Deficiency of other specified B group vitamins: Secondary | ICD-10-CM

## 2023-10-14 NOTE — Patient Instructions (Addendum)
[employer] probiotic daily  Tetanus is due after 10/18/2023 Hypertension, Adult High blood pressure (hypertension) is when the force of blood pumping through the arteries is too strong. The arteries are the blood vessels that carry blood from the heart throughout the body. Hypertension forces the heart to work harder to pump blood and may cause arteries to become narrow or stiff. Untreated or uncontrolled hypertension can lead to a heart attack, heart failure, a stroke, kidney disease, and other problems. A blood pressure reading consists of a higher number over a lower number. Ideally, your blood pressure should be below 120/80. The first ("top") number is called the systolic pressure. It is a measure of the pressure in your arteries as your heart beats. The second ("bottom") number is called the diastolic pressure. It is a measure of the pressure in your arteries as the heart relaxes. What are the causes? The exact cause of this condition is not known. There are some conditions that result in high blood pressure. What increases the risk? Certain factors may make you more likely to develop high blood pressure. Some of these risk factors are under your control, including: Smoking. Not getting enough exercise or physical activity. Being overweight. Having too much fat, sugar, calories, or salt (sodium) in your diet. Drinking too much alcohol. Other risk factors include: Having a personal history of heart disease, diabetes, high cholesterol, or kidney disease. Stress. Having a family history of high blood pressure and high cholesterol. Having obstructive sleep apnea. Age. The risk increases with age. What are the signs or symptoms? High blood pressure may not cause symptoms. Very high blood pressure (hypertensive crisis) may cause: Headache. Fast or irregular heartbeats (palpitations). Shortness of breath. Nosebleed. Nausea and vomiting. Vision changes. Severe chest pain, dizziness, and  seizures. How is this diagnosed? This condition is diagnosed by measuring your blood pressure while you are seated, with your arm resting on a flat surface, your legs uncrossed, and your feet flat on the floor. The cuff of the blood pressure monitor will be placed directly against the skin of your upper arm at the level of your heart. Blood pressure should be measured at least twice using the same arm. Certain conditions can cause a difference in blood pressure between your right and left arms. If you have a high blood pressure reading during one visit or you have normal blood pressure with other risk factors, you may be asked to: Return on a different day to have your blood pressure checked again. Monitor your blood pressure at home for 1 week or longer. If you are diagnosed with hypertension, you may have other blood or imaging tests to help your health care provider understand your overall risk for other conditions. How is this treated? This condition is treated by making healthy lifestyle changes, such as eating healthy foods, exercising more, and reducing your alcohol intake. You may be referred for counseling on a healthy diet and physical activity. Your health care provider may prescribe medicine if lifestyle changes are not enough to get your blood pressure under control and if: Your systolic blood pressure is above 130. Your diastolic blood pressure is above 80. Your personal target blood pressure may vary depending on your medical conditions, your age, and other factors. Follow these instructions at home: Eating and drinking  Eat a diet that is high in fiber and potassium, and low in sodium, added sugar, and fat. An example of this eating plan is called the DASH diet. DASH stands for  Dietary Approaches to Stop Hypertension. To eat this way: Eat plenty of fresh fruits and vegetables. Try to fill one half of your plate at each meal with fruits and vegetables. Eat whole grains, such as  whole-wheat pasta, brown rice, or whole-grain bread. Fill about one fourth of your plate with whole grains. Eat or drink low-fat dairy products, such as skim milk or low-fat yogurt. Avoid fatty cuts of meat, processed or cured meats, and poultry with skin. Fill about one fourth of your plate with lean proteins, such as fish, chicken without skin, beans, eggs, or tofu. Avoid pre-made and processed foods. These tend to be higher in sodium, added sugar, and fat. Reduce your daily sodium intake. Many people with hypertension should eat less than 1,500 mg of sodium a day. Do not drink alcohol if: Your health care provider tells you not to drink. You are pregnant, may be pregnant, or are planning to become pregnant. If you drink alcohol: Limit how much you have to: 0-1 drink a day for women. 0-2 drinks a day for men. Know how much alcohol is in your drink. In the [place] one drink equals one 12 oz bottle of beer (355 mL), one 5 oz glass of wine (148 mL), or one 1 oz glass of hard liquor (44 mL). Lifestyle  Work with your health care provider to maintain a healthy body weight or to lose weight. Ask what an ideal weight is for you. Get at least 30 minutes of exercise that causes your heart to beat faster (aerobic exercise) most days of the week. Activities may include walking, swimming, or biking. Include exercise to strengthen your muscles (resistance exercise), such as Pilates or lifting weights, as part of your weekly exercise routine. Try to do these types of exercises for 30 minutes at least 3 days a week. Do not use any products that contain nicotine or tobacco. These products include cigarettes, chewing tobacco, and vaping devices, such as e-cigarettes. If you need help quitting, ask your health care provider. Monitor your blood pressure at home as told by your health care provider. Keep all follow-up visits. This is important. Medicines Take over-the-counter and prescription medicines only as  told by your health care provider. Follow directions carefully. Blood pressure medicines must be taken as prescribed. Do not skip doses of blood pressure medicine. Doing this puts you at risk for problems and can make the medicine less effective. Ask your health care provider about side effects or reactions to medicines that you should watch for. Contact a health care provider if you: Think you are having a reaction to a medicine you are taking. Have headaches that keep coming back (recurring). Feel dizzy. Have swelling in your ankles. Have trouble with your vision. Get help right away if you: Develop a severe headache or confusion. Have unusual weakness or numbness. Feel faint. Have severe pain in your chest or abdomen. Vomit repeatedly. Have trouble breathing. These symptoms may be an emergency. Get help right away. Call 911. Do not wait to see if the symptoms will go away. Do not drive yourself to the hospital. Summary Hypertension is when the force of blood pumping through your arteries is too strong. If this condition is not controlled, it may put you at risk for serious complications. Your personal target blood pressure may vary depending on your medical conditions, your age, and other factors. For most people, a normal blood pressure is less than 120/80. Hypertension is treated with lifestyle changes, medicines, or a combination  of both. Lifestyle changes include losing weight, eating a healthy, low-sodium diet, exercising more, and limiting alcohol. This information is not intended to replace advice given to you by your health care provider. Make sure you discuss any questions you have with your health care provider. Document Revised: 07/08/2021 Document Reviewed: 07/08/2021 [employer] Patient Education  2024 [employer] Recommend calcium intake of 1200 to 1500 mg daily, divided into roughly 3 doses. Best source is the diet and a single dairy serving is about 500 mg, a supplement of  calcium citrate once or twice daily to balance diet is fine if not getting enough in diet. Also need Vitamin D 2000 IU caps, 1 cap daily if not already taking vitamin D. Also recommend weight baring exercise on hips and upper body to keep bones strong

## 2023-10-15 LAB — CBC WITH DIFFERENTIAL/PLATELET
Basophils Absolute: 0 10*3/uL (ref 0.0–0.1)
Basophils Relative: 0.6 % (ref 0.0–3.0)
Eosinophils Absolute: 0.1 10*3/uL (ref 0.0–0.7)
Eosinophils Relative: 2 % (ref 0.0–5.0)
HCT: 41.3 % (ref 36.0–46.0)
Hemoglobin: 14 g/dL (ref 12.0–15.0)
Lymphocytes Relative: 22.5 % (ref 12.0–46.0)
Lymphs Abs: 1.2 10*3/uL (ref 0.7–4.0)
MCHC: 34 g/dL (ref 30.0–36.0)
MCV: 96.9 fL (ref 78.0–100.0)
Monocytes Absolute: 0.6 10*3/uL (ref 0.1–1.0)
Monocytes Relative: 11.1 % (ref 3.0–12.0)
Neutro Abs: 3.3 10*3/uL (ref 1.4–7.7)
Neutrophils Relative %: 63.8 % (ref 43.0–77.0)
Platelets: 226 10*3/uL (ref 150.0–400.0)
RBC: 4.27 Mil/uL (ref 3.87–5.11)
RDW: 14.2 % (ref 11.5–15.5)
WBC: 5.1 10*3/uL (ref 4.0–10.5)

## 2023-10-15 LAB — VITAMIN B12: Vitamin B-12: 296 pg/mL (ref 211–911)

## 2023-10-15 LAB — LIPID PANEL
Cholesterol: 173 mg/dL (ref 0–200)
HDL: 71.3 mg/dL (ref >39.00)
LDL Cholesterol: 82 mg/dL (ref 0–99)
NonHDL: 101.98
Total CHOL/HDL Ratio: 2
Triglycerides: 100 mg/dL (ref 0.0–149.0)
VLDL: 20 mg/dL (ref 0.0–40.0)

## 2023-10-15 LAB — COMPREHENSIVE METABOLIC PANEL
ALT: 15 U/L (ref 0–35)
AST: 23 U/L (ref 0–37)
Albumin: 4.7 g/dL (ref 3.5–5.2)
Alkaline Phosphatase: 101 U/L (ref 39–117)
BUN: 14 mg/dL (ref 6–23)
CO2: 30 meq/L (ref 19–32)
Calcium: 9.8 mg/dL (ref 8.4–10.5)
Chloride: 97 meq/L (ref 96–112)
Creatinine, Ser: 0.74 mg/dL (ref 0.40–1.20)
GFR: 71.61 mL/min (ref >60.00)
Glucose, Bld: 96 mg/dL (ref 70–99)
Potassium: 4.9 meq/L (ref 3.5–5.1)
Sodium: 136 meq/L (ref 135–145)
Total Bilirubin: 0.6 mg/dL (ref 0.2–1.2)
Total Protein: 7 g/dL (ref 6.0–8.3)

## 2023-10-15 LAB — MICROALBUMIN / CREATININE URINE RATIO
Creatinine,U: 46.5 mg/dL
Microalb Creat Ratio: 2.1 mg/g (ref 0.0–30.0)
Microalb, Ur: 1 mg/dL (ref 0.0–1.9)

## 2023-10-15 LAB — HEMOGLOBIN A1C: Hgb A1c MFr Bld: 6.1 % (ref 4.6–6.5)

## 2023-10-15 LAB — VITAMIN D 25 HYDROXY (VIT D DEFICIENCY, FRACTURES): VITD: 34.71 ng/mL (ref 30.00–100.00)

## 2023-10-15 LAB — TSH: TSH: 36.33 u[IU]/mL — ABNORMAL HIGH (ref 0.35–5.50)

## 2023-10-15 LAB — MAGNESIUM: Magnesium: 2.2 mg/dL (ref 1.5–2.5)

## 2023-10-17 ENCOUNTER — Encounter: Payer: Self-pay | Admitting: Family Medicine

## 2023-10-17 NOTE — Progress Notes (Signed)
Subjective:    Patient ID: Catherine Coleman, female    DOB: Jan 19, 1932, 89 y.o.   MRN: [id number]  Chief Complaint  Patient presents with   Follow-up    4 month    HPI Discussed the use of AI scribe software for clinical note transcription with the patient, who gave verbal consent to proceed.  History of Present Illness   The patient, with a history of hypertension, diabetes, osteoporosis, and urinary incontinence, presents with fatigue which they attribute to Ozempic. They express a desire to discontinue this medication due to its exacerbation of their reflux symptoms. The patient also reports nocturia and urinary incontinence despite being on Myrbetriq. They express dissatisfaction with the medication's efficacy and its high cost. The patient also mentions concerns about their osteoporosis, stating that they have been receiving shots in their hip which have been effective for about eight months at a time. The patient's hypertension is also a concern, with recent readings being higher than usual, although they report that readings taken elsewhere have been within normal limits. The patient also discusses their diabetes management, stating that they have not been doing finger sticks but feel generally well. They express a desire to participate in a gene study and are due for a tetanus shot.        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   Constipation    Decreased hearing    Diabetes (HCC)    Diverticulitis    Coleman eyes    Fatigue    Floaters in visual field    Gall bladder disease    GERD (gastroesophageal reflux disease)    Glaucoma    Hiatal hernia with gastroesophageal reflux    History of blood transfusion    for Knee replacement   Hyperlipidemia, mild 04/02/2016   Hypertension    Hypothyroidism    Joint pain    Migraine aura without headache    Muscle pain    Muscle  stiffness    Nasal congestion    Obesity 04/02/2016   Osteoarthritis    Parotiditis 04/21/2016   Pre-diabetes    Vitamin D deficiency 04/02/2016    Past Surgical History:  Procedure Laterality Date   APPENDECTOMY     BREAST LUMPECTOMY WITH RADIOACTIVE SEED LOCALIZATION Left 01/21/2021   Procedure: LEFT BREAST LUMPECTOMY WITH RADIOACTIVE SEED LOCALIZATION;  Surgeon: Abigail Miyamoto, MD;  Location: [facility];  Service: General;  Laterality: Left;   cataract surgery  2008   CHOLECYSTECTOMY  2010   JOINT REPLACEMENT Left    2012   MASTOIDECTOMY     POSTERIOR CERVICAL FUSION/FORAMINOTOMY N/A 04/27/2017   Procedure: CERVICAL FIVE-SIX  OPEN REDUCTION OF FRACTURE, CERVICAL THREE-SEVEN  DORSAL FIXATION AND FUSION;  Surgeon: Ditty, Loura Halt, MD;  Location: [facility] OR;  Service: Neurosurgery;  Laterality: N/A;   SHOULDER ARTHROSCOPY Bilateral prior to 2010   TONSILLECTOMY     TOTAL HIP ARTHROPLASTY Right 03/22/2015   Procedure: RIGHT TOTAL HIP ARTHROPLASTY ANTERIOR APPROACH;  Surgeon: Jodi Geralds, MD;  Location: [facility] OR;  Service: Orthopedics;  Laterality: Right;   TOTAL KNEE ARTHROPLASTY Bilateral 2007   TUBAL LIGATION      Family History  Problem Relation Age of Onset   Dementia Mother    Hypertension Mother    Obesity Mother    Diabetes Mother    Heart disease Father  Hypertension Father    Stroke Father    Diabetes Father    Hyperlipidemia Father    Alcoholism Father    Obesity Father    Cancer Maternal Grandfather        colon cancer   Arthritis Daughter     Social History   Socioeconomic History   Marital status: Widowed    Spouse name: Not on file   Number of children: Not on file   Years of education: Not on file   Highest education level: Not on file  Occupational History   Occupation: Retired  Tobacco Use   Smoking status: Never   Smokeless tobacco: Never   Tobacco comments:    Stopped 50 years ago.   Vaping Use   Vaping status: Never Used   Substance and Sexual Activity   Alcohol use: Yes    Alcohol/week: 0.0 standard drinks of alcohol    Comment: socially 3-4x/wk   Drug use: No   Sexual activity: Not Currently  Other Topics Concern   Not on file  Social History Narrative   Lives at [facility], widowed 7 years, no dietary restrictions. Retired from neuropsychiatry work.    Social Drivers of Corporate investment banker Strain: Low Risk  (10/21/2022)   Overall Financial Resource Strain (CARDIA)    Difficulty of Paying Living Expenses: Not hard at all  Food Insecurity: No Food Insecurity (10/21/2022)   Hunger Vital Sign    Worried About Running Out of Food in the Last Year: Never true    Ran Out of Food in the Last Year: Never true  Transportation Needs: No Transportation Needs (10/21/2022)   PRAPARE - Administrator, Civil Service (Medical): No    Lack of Transportation (Non-Medical): No  Physical Activity: Sufficiently Active (10/21/2022)   Exercise Vital Sign    Days of Exercise per Week: 3 days    Minutes of Exercise per Session: 60 min  Stress: No Stress Concern Present (10/21/2022)   [employer] of Occupational Health - Occupational Stress Questionnaire    Feeling of Stress : Not at all  Social Connections: Moderately Isolated (10/21/2022)   Social Connection and Isolation Panel [NHANES]    Frequency of Communication with Friends and Family: More than three times a week    Frequency of Social Gatherings with Friends and Family: More than three times a week    Attends Religious Services: Never    Database administrator or Organizations: Yes    Attends Banker Meetings: 1 to 4 times per year    Marital Status: Widowed  Intimate Partner Violence: Not At Risk (10/21/2022)   Humiliation, Afraid, Rape, and Kick questionnaire    Fear of Current or Ex-Partner: No    Emotionally Abused: No    Physically Abused: No    Sexually Abused: No    Outpatient Medications Prior to Visit  Medication  Sig Dispense Refill   anastrozole (ARIMIDEX) 1 MG tablet TAKE 1 TABLET BY MOUTH DAILY 100 tablet 2   Biotin 81191 MCG TABS Take 1 tablet by mouth daily.     brimonidine (ALPHAGAN) 0.2 % ophthalmic solution Place 1 drop into both eyes 2 (two) times daily. 5 mL 12   famotidine (PEPCID) 40 MG tablet TAKE 1 TABLET BY MOUTH AT  BEDTIME AS NEEDED FOR HEARTBURN  OR INDIGESTION (Patient taking differently: Take 40 mg by mouth at bedtime.) 100 tablet 2   latanoprost (XALATAN) 0.005 % ophthalmic solution Place 1 drop  into both eyes at bedtime.     levothyroxine (SYNTHROID) 125 MCG tablet TAKE 1 TABLET BY MOUTH DAILY  BEFORE BREAKFAST 100 tablet 2   losartan (COZAAR) 50 MG tablet TAKE 1 TABLET BY MOUTH DAILY 100 tablet 2   MAGNESIUM-POTASSIUM PO Take 1 tablet by mouth daily.     Multiple Vitamins-Minerals (PRESERVISION AREDS PO) Take 1 tablet by mouth daily.     omeprazole (PRILOSEC) 40 MG capsule TAKE 1 CAPSULE BY MOUTH DAILY 100 capsule 2   rosuvastatin (CRESTOR) 5 MG tablet Take 1 tablet (5 mg total) by mouth daily. 90 tablet 0   MYRBETRIQ 50 MG TB24 tablet TAKE 1 TABLET BY MOUTH DAILY 100 tablet 2   Semaglutide,0.25 or 0.5MG /DOS, (OZEMPIC, 0.25 OR 0.5 MG/DOSE,) 2 MG/3ML SOPN Inject 0.25 mg into the skin once a week. 3 mL 2   Blood Glucose Monitoring Suppl DEVI 1 each by Does not apply route in the morning, at noon, and at bedtime. May substitute to any manufacturer covered by patient's insurance. (Patient not taking: Reported on 10/14/2023) 1 each 0   Calcium Carb-Cholecalciferol 600-10 MG-MCG TABS Take 1 tablet by mouth daily. (Patient not taking: Reported on 10/14/2023)     Vitamin D, Cholecalciferol, 25 MCG (1000 UT) CAPS Take 1 capsule by mouth daily. (Patient not taking: Reported on 10/14/2023)     No facility-administered medications prior to visit.    No Known Allergies  Review of Systems  Constitutional:  Negative for fever and malaise/fatigue.  HENT:  Negative for congestion.   Eyes:   Negative for blurred vision.  Respiratory:  Negative for shortness of breath.   Cardiovascular:  Negative for chest pain, palpitations and leg swelling.  Gastrointestinal:  Positive for heartburn. Negative for abdominal pain, blood in stool and nausea.  Genitourinary:  Positive for frequency and urgency. Negative for dysuria, flank pain and hematuria.  Musculoskeletal:  Negative for falls.  Skin:  Negative for rash.  Neurological:  Negative for dizziness, loss of consciousness and headaches.  Endo/Heme/Allergies:  Negative for environmental allergies.  Psychiatric/Behavioral:  Negative for depression. The patient is not nervous/anxious.        Objective:    Physical Exam Constitutional:      General: She is not in acute distress.    Appearance: Normal appearance. She is not diaphoretic.  HENT:     Head: Normocephalic and atraumatic.     Right Ear: Tympanic membrane, ear canal and external ear normal.     Left Ear: Tympanic membrane, ear canal and external ear normal.     Nose: Nose normal.     Mouth/Throat:     Mouth: Mucous membranes are moist.     Pharynx: Oropharynx is clear. No oropharyngeal exudate.  Eyes:     General: No scleral icterus.       Right eye: No discharge.        Left eye: No discharge.     Conjunctiva/sclera: Conjunctivae normal.     Pupils: Pupils are equal, round, and reactive to light.  Neck:     Thyroid: No thyromegaly.  Cardiovascular:     Rate and Rhythm: Normal rate and regular rhythm.     Heart sounds: Normal heart sounds. No murmur heard. Pulmonary:     Effort: Pulmonary effort is normal. No respiratory distress.     Breath sounds: Normal breath sounds. No wheezing or rales.  Abdominal:     General: Bowel sounds are normal. There is no distension.     Palpations:  Abdomen is soft. There is no mass.     Tenderness: There is no abdominal tenderness.  Musculoskeletal:        General: No tenderness. Normal range of motion.     Cervical back:  Normal range of motion and neck supple.  Lymphadenopathy:     Cervical: No cervical adenopathy.  Skin:    General: Skin is warm and Coleman.     Findings: No rash.  Neurological:     General: No focal deficit present.     Mental Status: She is alert and oriented to person, place, and time.     Cranial Nerves: No cranial nerve deficit.     Coordination: Coordination normal.     Deep Tendon Reflexes: Reflexes are normal and symmetric. Reflexes normal.  Psychiatric:        Mood and Affect: Mood normal.        Behavior: Behavior normal.        Thought Content: Thought content normal.        Judgment: Judgment normal.     BP (!) 152/90   Pulse 72   Temp 97.6 F (36.4 C) (Oral)   Resp 16   Ht 5\' 5"  (1.651 m)   Wt 195 lb 6.4 oz (88.6 kg)   SpO2 96%   BMI 32.52 kg/m  Wt Readings from Last 3 Encounters:  10/14/23 195 lb 6.4 oz (88.6 kg)  10/07/23 189 lb (85.7 kg)  08/10/23 193 lb (87.5 kg)    Diabetic Foot Exam - Simple   No data filed    Lab Results  Component Value Date   WBC 5.1 10/14/2023   HGB 14.0 10/14/2023   HCT 41.3 10/14/2023   PLT 226.0 10/14/2023   GLUCOSE 96 10/14/2023   CHOL 173 10/14/2023   TRIG 100.0 10/14/2023   HDL 71.30 10/14/2023   LDLDIRECT 138.0 05/15/2019   LDLCALC 82 10/14/2023   ALT 15 10/14/2023   AST 23 10/14/2023   NA 136 10/14/2023   K 4.9 10/14/2023   CL 97 10/14/2023   CREATININE 0.74 10/14/2023   BUN 14 10/14/2023   CO2 30 10/14/2023   TSH 36.33 (H) 10/14/2023   INR 1.05 04/28/2017   HGBA1C 6.1 10/14/2023   MICROALBUR 1.0 10/14/2023    Lab Results  Component Value Date   TSH 36.33 (H) 10/14/2023   Lab Results  Component Value Date   WBC 5.1 10/14/2023   HGB 14.0 10/14/2023   HCT 41.3 10/14/2023   MCV 96.9 10/14/2023   PLT 226.0 10/14/2023   Lab Results  Component Value Date   NA 136 10/14/2023   K 4.9 10/14/2023   CO2 30 10/14/2023   GLUCOSE 96 10/14/2023   BUN 14 10/14/2023   CREATININE 0.74 10/14/2023   BILITOT  0.6 10/14/2023   ALKPHOS 101 10/14/2023   AST 23 10/14/2023   ALT 15 10/14/2023   PROT 7.0 10/14/2023   ALBUMIN 4.7 10/14/2023   CALCIUM 9.8 10/14/2023   ANIONGAP 11 03/02/2022   EGFR 65 08/11/2023   GFR 71.61 10/14/2023   Lab Results  Component Value Date   CHOL 173 10/14/2023   Lab Results  Component Value Date   HDL 71.30 10/14/2023   Lab Results  Component Value Date   LDLCALC 82 10/14/2023   Lab Results  Component Value Date   TRIG 100.0 10/14/2023   Lab Results  Component Value Date   CHOLHDL 2 10/14/2023   Lab Results  Component Value Date   HGBA1C  6.1 10/14/2023       Assessment & Plan:  Hyperlipidemia associated with type 2 diabetes mellitus (HCC) Assessment & Plan: Encourage heart healthy diet such as MIND or DASH diet, increase exercise, avoid trans fats, simple carbohydrates and processed foods, consider a krill or fish or flaxseed oil cap daily.  hgba1c acceptable, minimize simple carbs. Increase exercise as tolerated. Continue current meds   Orders: -     Lipid panel  Need for pneumococcal 20-valent conjugate vaccination -     Pneumococcal conjugate vaccine 20-valent  Vitamin D deficiency -     VITAMIN D 25 Hydroxy (Vit-D Deficiency, Fractures)  Type 2 diabetes mellitus with obesity (HCC) -     Hemoglobin A1c -     Microalbumin / creatinine urine ratio  Other specified hypothyroidism  Primary hypertension -     Comprehensive metabolic panel -     CBC with Differential/Platelet -     TSH  Muscle cramps -     Magnesium  Vitamin B 12 deficiency -     Vitamin B12    Assessment and Plan    Diabetes Mellitus Patient reports exacerbation of reflux due to Ozempic. Patient wishes to discontinue medication. -Discontinue Ozempic. -Check blood glucose levels if feeling weak, confused, shaky, or sweaty.  Gastroesophageal Reflux Disease (GERD) Exacerbated by Ozempic. Patient reports regurgitation of pills. -Discontinue Ozempic. -Consider  probiotic supplementation.  Urinary Incontinence Patient reports nocturnal leakage despite Myrbetriq use. Previous consultation with urogynecologist. -Continue Myrbetriq until supply is depleted. -Consider switching to VESIcare or another medication in the same family. -Consider re-referral to urogynecologist for further evaluation.  Cardiac Conduction Abnormalities Patient reports right bundle branch block, left anterior fascicular block, and potential third degree AV block. Patient instructed to notify doctor if heart rate drops to the 30s. -Monitor heart rate and symptoms closely. -Consider pacemaker if heart rate drops significantly.  Aortic Aneurysm Patient reports aneurysm size of 4.2. Open heart surgery required if size increases to 5. -Monitor aneurysm size closely.  Osteoporosis Patient reports diagnosis but is not currently on medication. -Encourage regular exercise, calcium intake, and weight-bearing activities. -Consider medication if patient's condition worsens or if patient changes mind.  Hypertension Blood pressure elevated at 154/96. Patient reports normal readings at other healthcare visits. -Continue current medication (Cozaar 50mg ). -Recheck blood pressure in 6 weeks. -Consider adding another medication if blood pressure remains elevated.  General Health Maintenance / Followup Plans -Administer Prevnar 20 pneumonia vaccine today. -Schedule nurse visit in 6 weeks for blood pressure check. -Four month follow-up appointment for hypertension. -Consider anxiety medication if patient's mental health worsens. -Continue probiotic and multivitamin supplementation. -Encourage hand hygiene and other preventive measures. -Continue hip injections as needed for arthritis pain.         Danise Edge, MD

## 2023-10-25 ENCOUNTER — Encounter: Payer: Self-pay | Admitting: Family Medicine

## 2023-10-25 ENCOUNTER — Other Ambulatory Visit (HOSPITAL_BASED_OUTPATIENT_CLINIC_OR_DEPARTMENT_OTHER): Payer: Self-pay

## 2023-10-25 ENCOUNTER — Ambulatory Visit (INDEPENDENT_AMBULATORY_CARE_PROVIDER_SITE_OTHER): Payer: Medicare Other | Admitting: Family Medicine

## 2023-10-25 VITALS — BP 137/84 | HR 62 | Temp 97.3°F | Ht 65.0 in | Wt 193.0 lb

## 2023-10-25 DIAGNOSIS — E1169 Type 2 diabetes mellitus with other specified complication: Secondary | ICD-10-CM

## 2023-10-25 DIAGNOSIS — R112 Nausea with vomiting, unspecified: Secondary | ICD-10-CM

## 2023-10-25 DIAGNOSIS — R519 Headache, unspecified: Secondary | ICD-10-CM

## 2023-10-25 DIAGNOSIS — E669 Obesity, unspecified: Secondary | ICD-10-CM

## 2023-10-25 MED ORDER — TETANUS-DIPHTH-ACELL PERTUSSIS 5-2.5-18.5 LF-MCG/0.5 IM SUSY
0.5000 mL | PREFILLED_SYRINGE | Freq: Once | INTRAMUSCULAR | 0 refills | Status: AC
  Filled 2023-10-25: qty 0.5, 1d supply, fill #0

## 2023-10-25 MED ORDER — FLUTICASONE PROPIONATE 50 MCG/ACT NA SUSP
2.0000 | Freq: Every day | NASAL | 6 refills | Status: DC
  Filled 2023-10-25: qty 16, 30d supply, fill #0

## 2023-10-25 NOTE — Progress Notes (Signed)
 Acute Office Visit  Subjective:     Patient ID: Catherine Coleman, female    DOB: 12-Nov-1931, 89 y.o.   MRN: [id number]  Chief Complaint  Patient presents with   Nausea    HPI Patient is in today for nausea.   Discussed the use of AI scribe software for clinical note transcription with the patient, who gave verbal consent to proceed.  History of Present Illness   Catherine Coleman is an 89 year old female with diabetes who presents with vomiting and epigastric discomfort.  She experiences episodes of vomiting and epigastric discomfort, which she attributes to her use of Ozempic . She stopped taking Ozempic  two weeks ago, and since then, the symptoms have decreased. The vomiting typically occurs 10 to 20 minutes after meals or medication intake, especially after large meals. She has been experiencing these symptoms intermittently for about a year. She has tried adjusting the timing of her medication intake, which has provided some relief. No heartburn, reflux, abdominal pain, chest pain, sweating, fever, or chills. Bowel movements are normal.  She has been on omeprazole  for 20 years, taking it before breakfast, and occasionally uses Pepcid  when symptoms are bothersome. She has a history of high blood pressure, which was noted to be elevated previously but is currently well-controlled. She does not regularly check blood glucose levels.  She reports symptoms suggestive of a recurrent sinus headache, including frontal pressure and a runny nose, which she attributes to allergies, possibly from cleaning products used in her residence. She has not been using any nasal sprays for this issue.           ROS All review of systems negative except what is listed in the HPI      Objective:    BP 137/84   Pulse 62   Temp (!) 97.3 F (36.3 C) (Oral)   Ht 5\' 5"  (1.651 m)   Wt 193 lb (87.5 kg)   SpO2 97%   BMI 32.12 kg/m    Physical Exam Vitals reviewed.  Constitutional:       Appearance: Normal appearance.  Cardiovascular:     Rate and Rhythm: Normal rate and regular rhythm.  Pulmonary:     Effort: Pulmonary effort is normal.     Breath sounds: Normal breath sounds.  Abdominal:     General: Abdomen is flat. Bowel sounds are normal. There is no distension.     Palpations: Abdomen is soft. There is no mass.     Tenderness: There is no abdominal tenderness. There is no guarding or rebound.  Skin:    General: Skin is warm and dry.  Neurological:     Mental Status: She is alert and oriented to person, place, and time.  Psychiatric:        Mood and Affect: Mood normal.        Behavior: Behavior normal.        Thought Content: Thought content normal.        Judgment: Judgment normal.     No results found for any visits on 10/25/23.      Assessment & Plan:   Problem List Items Addressed This Visit       Active Problems   Type 2 diabetes mellitus with obesity (HCC)   Other Visit Diagnoses       Sinus headache    -  Primary   Relevant Medications   fluticasone  (FLONASE ) 50 MCG/ACT nasal spray     Nausea and vomiting, unspecified vomiting type  Postprandial Vomiting Vomiting occurring within 20-30 minutes of meals or medication ingestion. No associated abdominal pain, fever, or chills. Symptoms have improved significantly since discontinuation of Ozempic  two weeks ago. -Continue to monitor symptoms off Ozempic . -Consider referral to GI specialist if symptoms persist or worsen.   Type 2 Diabetes Well controlled with recent A1c of 6.1. Patient recently stopped Ozempic  due to suspected GI side effects. -Monitor fasting blood glucose at least a few times per week. -Consider alternative diabetes medication if blood glucose levels consistently above 150.  Sinus headache Recurrent sinus pressure and runny nose, potentially allergic in nature. -Start Flonase  nasal spray. Supportive measures encouraged.  -Follow-up if not improving.            Meds ordered this encounter  Medications   fluticasone  (FLONASE ) 50 MCG/ACT nasal spray    Sig: Place 2 sprays into both nostrils daily.    Dispense:  16 g    Refill:  6    Supervising Provider:   Randie Bustle A [[id number]]    Return if symptoms worsen or fail to improve.  Everlina Hock, NP

## 2023-11-01 ENCOUNTER — Ambulatory Visit (INDEPENDENT_AMBULATORY_CARE_PROVIDER_SITE_OTHER): Payer: Medicare Other | Admitting: Nurse Practitioner

## 2023-11-01 ENCOUNTER — Encounter: Payer: Self-pay | Admitting: Nurse Practitioner

## 2023-11-01 VITALS — BP 135/82 | HR 70 | Temp 97.5°F | Ht 65.0 in | Wt 190.0 lb

## 2023-11-01 DIAGNOSIS — E039 Hypothyroidism, unspecified: Secondary | ICD-10-CM | POA: Diagnosis not present

## 2023-11-01 DIAGNOSIS — E669 Obesity, unspecified: Secondary | ICD-10-CM

## 2023-11-01 DIAGNOSIS — Z6831 Body mass index (BMI) 31.0-31.9, adult: Secondary | ICD-10-CM | POA: Diagnosis not present

## 2023-11-01 DIAGNOSIS — E1169 Type 2 diabetes mellitus with other specified complication: Secondary | ICD-10-CM

## 2023-11-01 DIAGNOSIS — E6609 Other obesity due to excess calories: Secondary | ICD-10-CM

## 2023-11-01 DIAGNOSIS — E66811 Obesity, class 1: Secondary | ICD-10-CM

## 2023-11-01 NOTE — Progress Notes (Signed)
Office: [phone]  /  Fax: [phone]  WEIGHT SUMMARY AND BIOMETRICS  Weight Lost Since Last Visit: 0lb  Weight Gained Since Last Visit: 1lb   Vitals Temp: (!) 97.5 F (36.4 C) BP: 135/82 Pulse Rate: 70 SpO2: 97 %   Anthropometric Measurements Height: 5\' 5"  (1.651 m) Weight: 190 lb (86.2 kg) BMI (Calculated): 31.62 Weight at Last Visit: 189lb Weight Lost Since Last Visit: 0lb Weight Gained Since Last Visit: 1lb Starting Weight: 211lb Total Weight Loss (lbs): 21 lb (9.526 kg)   Body Composition  Body Fat %: 46 % Fat Mass (lbs): 87.6 lbs Muscle Mass (lbs): 97.6 lbs Total Body Water (lbs): 73.2 lbs Visceral Fat Rating : 15   Other Clinical Data Fasting: No Labs: No Today's Visit #: 14 Starting Date: 10/26/22     HPI  Chief Complaint: OBESITY  Catherine Coleman is here to discuss her progress with her obesity treatment plan. She is on the the Category 2 Plan and states she is following her eating plan approximately 0 % of the time. She states she is exercising 60 minutes 3 days per week.   Interval History:  Since last office visit she has gained 1 pound. She is not skipping meals and is eating a protein with each meal.   She is drinking water and wine 3-4 nights per week.  She hasn't been exercising as she was in the past due to the weather.    Her highest weight was 224 lbs.    Pharmacotherapy for weight loss: She is not currently taking medications  for medical weight loss.     Pharmacotherapy for DMT2:  She is not currently taking medications. She was taking Ozempic and I stopped it after her last visit due to side effects. She stopped 3 weeks ago.  She reports that she has had one episode of vomiting right after her last injection.  Denies GERD since stopping Ozempic.  She saw Dr. Abner Greenspan on 10/14/23.   She is overall feeling better.   Last A1c was 6.1 She is not checking BS at home.   Episodes of hypoglycemia: no On ACE and statin.  Last eye exam:   2024 She has tried Ozempic in the past.    Lab Results  Component Value Date   HGBA1C 6.1 10/14/2023   HGBA1C 5.9 06/01/2023   HGBA1C 6.0 01/15/2023   Lab Results  Component Value Date   MICROALBUR 1.0 10/14/2023   LDLCALC 82 10/14/2023   CREATININE 0.74 10/14/2023    Hypothyroidism Stable.  Does not report symptoms associated with uncontrolled hypothyroidism. Medication(s): Levothyroxine 125 mcg daily.  Last TSH was out of range-probably due to vomiting.  Will recheck at next visit.  She has been taking daily now without any problems.      Lab Results  Component Value Date   TSH 36.33 (H) 10/14/2023    PHYSICAL EXAM:  Blood pressure 135/82, pulse 70, temperature (!) 97.5 F (36.4 C), height 5\' 5"  (1.651 m), weight 190 lb (86.2 kg), SpO2 97%. Body mass index is 31.62 kg/m.  General: She is overweight, cooperative, alert, well developed, and in no acute distress. PSYCH: Has normal mood, affect and thought process.   Extremities: No edema.  Neurologic: No gross sensory or motor deficits. No tremors or fasciculations noted.    DIAGNOSTIC DATA REVIEWED:  BMET    Component Value Date/Time   NA 136 10/14/2023 1414   NA 137 08/11/2023 0906   K 4.9 10/14/2023 1414   CL 97  10/14/2023 1414   CO2 30 10/14/2023 1414   GLUCOSE 96 10/14/2023 1414   BUN 14 10/14/2023 1414   BUN 14 08/11/2023 0906   CREATININE 0.74 10/14/2023 1414   CREATININE 0.76 01/02/2022 1331   CALCIUM 9.8 10/14/2023 1414   GFRNONAA >60 03/02/2022 1307   GFRNONAA >60 01/02/2022 1331   GFRAA 94 09/28/2018 1147   Lab Results  Component Value Date   HGBA1C 6.1 10/14/2023   HGBA1C 6.4 10/18/2017   Lab Results  Component Value Date   INSULIN 7.9 10/26/2022   INSULIN 15.5 05/30/2018   Lab Results  Component Value Date   TSH 36.33 (H) 10/14/2023   CBC    Component Value Date/Time   WBC 5.1 10/14/2023 1414   RBC 4.27 10/14/2023 1414   HGB 14.0 10/14/2023 1414   HGB 13.0 01/02/2022 1331   HCT  41.3 10/14/2023 1414   PLT 226.0 10/14/2023 1414   PLT 197 01/02/2022 1331   MCV 96.9 10/14/2023 1414   MCH 31.9 08/12/2023 0829   MCHC 34.0 10/14/2023 1414   RDW 14.2 10/14/2023 1414   Iron Studies No results found for: "IRON", "TIBC", "FERRITIN", "IRONPCTSAT" Lipid Panel     Component Value Date/Time   CHOL 173 10/14/2023 1414   CHOL 226 (H) 09/28/2018 1147   TRIG 100.0 10/14/2023 1414   HDL 71.30 10/14/2023 1414   HDL 60 09/28/2018 1147   CHOLHDL 2 10/14/2023 1414   VLDL 20.0 10/14/2023 1414   LDLCALC 82 10/14/2023 1414   LDLCALC 134 (H) 09/28/2018 1147   LDLDIRECT 138.0 05/15/2019 1022   Hepatic Function Panel     Component Value Date/Time   PROT 7.0 10/14/2023 1414   PROT 6.7 09/28/2018 1147   ALBUMIN 4.7 10/14/2023 1414   ALBUMIN 4.6 09/28/2018 1147   AST 23 10/14/2023 1414   AST 20 01/02/2022 1331   ALT 15 10/14/2023 1414   ALT 19 01/02/2022 1331   ALKPHOS 101 10/14/2023 1414   BILITOT 0.6 10/14/2023 1414   BILITOT 0.3 01/02/2022 1331      Component Value Date/Time   TSH 36.33 (H) 10/14/2023 1414   Nutritional Lab Results  Component Value Date   VD25OH 34.71 10/14/2023   VD25OH 62.50 01/15/2023   VD25OH 41.92 10/12/2022     ASSESSMENT AND PLAN  TREATMENT PLAN FOR OBESITY:  Recommended Dietary Goals  Catherine Coleman is currently in the action stage of change. As such, her goal is to continue weight management plan. She has agreed to the Category 2 Plan.  Behavioral Intervention  We discussed the following Behavioral Modification Strategies today: increasing lean protein intake to established goals, decreasing simple carbohydrates , increasing vegetables, increasing water intake , work on meal planning and preparation, reading food labels , keeping healthy foods at home, continue to work on implementation of reduced calorie nutritional plan, continue to practice mindfulness when eating, planning for success, and continue to work on maintaining a reduced  calorie state, getting the recommended amount of protein, incorporating whole foods, making healthy choices, staying well hydrated and practicing mindfulness when eating..  Additional resources provided today: NA  Recommended Physical Activity Goals  Catherine Coleman has been advised to work up to 150 minutes of moderate intensity aerobic activity a week and strengthening exercises 2-3 times per week for cardiovascular health, weight loss maintenance and preservation of muscle mass.   She has agreed to Continue current level of physical activity , Think about enjoyable ways to increase daily physical activity and overcoming barriers to exercise,  and Increase physical activity in their day and reduce sedentary time (increase NEAT).   Pharmacotherapy She is not a candidate for Phentermine or Qsymia  ASSOCIATED CONDITIONS ADDRESSED TODAY  Action/Plan  Type 2 diabetes mellitus with obesity (HCC) Good blood sugar control is important to decrease the likelihood of diabetic complications such as nephropathy, neuropathy, limb loss, blindness, coronary artery disease, and death. Intensive lifestyle modification including diet, exercise and weight loss are the first line of treatment for diabetes.    Patient has more Ozempic at home.  I've asked her NOT to restart taking it.   Will continue to monitor.    Hypothyroidism, unspecified type To have labs rechecked with PCP in 4-6 weeks after last TSH.   Class 1 obesity due to excess calories with serious comorbidity and body mass index (BMI) of 31.0 to 31.9 in adult      Will obtain labs at next visit if not obtained at PCP's office.    Return in about 4 weeks (around 11/29/2023).Marland Kitchen She was informed of the importance of frequent follow up visits to maximize her success with intensive lifestyle modifications for her multiple health conditions.   ATTESTASTION STATEMENTS:  Reviewed by clinician on day of visit: allergies, medications, problem list,  medical history, surgical history, family history, social history, and previous encounter notes.   Time spent on visit including pre-visit chart review and post-visit care and charting was 30 minutes.    Catherine Sato. Chrislyn Seedorf FNP-C

## 2023-11-02 DIAGNOSIS — H353211 Exudative age-related macular degeneration, right eye, with active choroidal neovascularization: Secondary | ICD-10-CM | POA: Diagnosis not present

## 2023-11-03 ENCOUNTER — Other Ambulatory Visit: Payer: Self-pay | Admitting: Family Medicine

## 2023-11-05 ENCOUNTER — Other Ambulatory Visit (HOSPITAL_BASED_OUTPATIENT_CLINIC_OR_DEPARTMENT_OTHER): Payer: Self-pay | Admitting: Family Medicine

## 2023-11-05 ENCOUNTER — Other Ambulatory Visit (HOSPITAL_BASED_OUTPATIENT_CLINIC_OR_DEPARTMENT_OTHER): Payer: Self-pay | Admitting: Hematology and Oncology

## 2023-11-05 DIAGNOSIS — Z1231 Encounter for screening mammogram for malignant neoplasm of breast: Secondary | ICD-10-CM

## 2023-11-24 ENCOUNTER — Other Ambulatory Visit: Payer: Self-pay | Admitting: Emergency Medicine

## 2023-11-24 ENCOUNTER — Telehealth: Payer: Self-pay | Admitting: *Deleted

## 2023-11-24 DIAGNOSIS — I10 Essential (primary) hypertension: Secondary | ICD-10-CM

## 2023-11-24 NOTE — Telephone Encounter (Signed)
 Patient has appointment on 12/02/23 and we are needing lab orders.

## 2023-11-24 NOTE — Telephone Encounter (Signed)
 Done. Thank you.

## 2023-11-29 ENCOUNTER — Ambulatory Visit: Payer: Self-pay | Admitting: Family Medicine

## 2023-11-29 NOTE — Telephone Encounter (Signed)
 Chief Complaint: Diarrhea Symptoms: weakness Frequency: Onset Saturday, none at present Pertinent Negatives: Patient denies other symptoms Disposition: [] ED /[] Urgent Care (no appt availability in office) / [x] Appointment(In office/virtual)/ []  [facility]/ [] Home Care/ [] Refused Recommended Disposition /[]  Mobile Bus/ []  Follow-up with PCP Additional Notes: Patient says she was with her daughter on Friday and vomited 7-9 times that night, then on Saturday morning started with diarrhea that was dark black looking in color, formed stools, then became liquid. She took 4 imodium and the diarrhea stopped around 1p, so her daughter drove her home. Patient says the diarrhea started again on Sunday night, she took 3 imodium on Sunday and 1 imodium this morning, no more diarrhea at this time. She says she has not vomited since Friday night. She says she\'s consumed at least 40 oz liquid on yesterday, which included [employer] water, gatorade, and this morning she drank 10 oz mug of tea. She says she\'s urinating and it\'s a brownish color. No nausea reported, no dizziness reported, just says she feels weak. Advised virtual visit via MyChart, she agreed and says she\'s able to access her MyChart for the visit. Scheduled on tomorrow with PCP.   Summary: diarrhea   Copied From CRM #[id number]. Reason for Triage: Patient states having diarrhea, vomiting, weak, dehydrated, and unsure of a fever. Patient has immodium and has not helped. Patient denies pain, lightheaded, dizziness, vision issues, shortness of breath, nor headaches. Patient would like to speak with nurse quickly, scared to eat and drink. Please advise and [phone].     Reason for Disposition  [1] SEVERE diarrhea (e.g., 7 or more times / day more than normal) AND [2] present > 24 hours (1 day)  Answer Assessment - Initial Assessment Questions 1. DIARRHEA SEVERITY: "How bad is the diarrhea?" "How many more stools have you had  in the past 24 hours than normal?"    - NO DIARRHEA (SCALE 0)   - MILD (SCALE 1-3): Few loose or mushy BMs; increase of 1-3 stools over normal daily number of stools; mild increase in ostomy output.   -  MODERATE (SCALE 4-7): Increase of 4-6 stools daily over normal; moderate increase in ostomy output.   -  SEVERE (SCALE 8-10; OR "WORST POSSIBLE"): Increase of 7 or more stools daily over normal; moderate increase in ostomy output; incontinence.     10 -11 2. ONSET: "When did the diarrhea begin?"      Saturday morning 3. BM CONSISTENCY: "How loose or watery is the diarrhea?"      Watery 4. VOMITING: "Are you also vomiting?" If Yes, ask: "How many times in the past 24 hours?"      No vomiting 5. ABDOMEN PAIN: "Are you having any abdomen pain?" If Yes, ask: "What does it feel like?" (e.g., crampy, dull, intermittent, constant)      No 6. ABDOMEN PAIN SEVERITY: If present, ask: "How bad is the pain?"  (e.g., Scale 1-10; mild, moderate, or severe)   - MILD (1-3): doesn't interfere with normal activities, abdomen soft and not tender to touch    - MODERATE (4-7): interferes with normal activities or awakens from sleep, abdomen tender to touch    - SEVERE (8-10): excruciating pain, doubled over, unable to do any normal activities       No 7. ORAL INTAKE: If vomiting, "Have you been able to drink liquids?" "How much liquids have you had in the past 24 hours?"     No vomiting 8. HYDRATION: "Any signs  of dehydration?" (e.g., dry mouth [not just dry lips], too weak to stand, dizziness, new weight loss) "When did you last urinate?"     Maybe a little, but not bad, urine is a brownish color last few days 9. EXPOSURE: "Have you traveled to a foreign country recently?" "Have you been exposed to anyone with diarrhea?" "Could you have eaten any food that was spoiled?"     No 10. ANTIBIOTIC USE: "Are you taking antibiotics now or have you taken antibiotics in the past 2 months?"       No 11. OTHER SYMPTOMS:  "Do you have any other symptoms?" (e.g., fever, blood in stool)       Weakness  Protocols used: Diarrhea-A-AH

## 2023-11-30 ENCOUNTER — Encounter: Payer: Self-pay | Admitting: Family Medicine

## 2023-11-30 ENCOUNTER — Telehealth (INDEPENDENT_AMBULATORY_CARE_PROVIDER_SITE_OTHER): Admitting: Family Medicine

## 2023-11-30 DIAGNOSIS — J029 Acute pharyngitis, unspecified: Secondary | ICD-10-CM | POA: Diagnosis not present

## 2023-11-30 DIAGNOSIS — R0981 Nasal congestion: Secondary | ICD-10-CM | POA: Diagnosis not present

## 2023-11-30 DIAGNOSIS — L989 Disorder of the skin and subcutaneous tissue, unspecified: Secondary | ICD-10-CM | POA: Diagnosis not present

## 2023-11-30 NOTE — Progress Notes (Signed)
 Subjective:    Patient ID: Catherine Coleman, female    DOB: 04-22-1932, 89 y.o.   MRN: [id number]  No chief complaint on file.  MyChart Video Visit    Virtual Visit via Video Note   This patient is at least at moderate risk for complications without adequate follow up. This format is felt to be most appropriate for this patient at this time. Physical exam was limited by quality of the video and audio technology used for the visit. Juanetta, CMA was able to get the patient set up on a video visit.  Patient location: home Patient and provider in visit Provider location: Office  I discussed the limitations of evaluation and management by telemedicine and the availability of in person appointments. The patient expressed understanding and agreed to proceed.  Visit Date: 11/30/2023  Today's healthcare provider: Danise Edge, MD   HPI Discussed the use of AI scribe software for clinical note transcription with the patient, who gave verbal consent to proceed.  History of Present Illness Catherine Coleman is an 89 year old female who presents with diarrhea and weakness.  She has been experiencing diarrhea since Sunday night, which was continuous and brown in color. She managed her symptoms by resting, drinking water, and taking an over-the-counter medication similar to Imodium. Since Monday morning, she has not experienced further vomiting or diarrhea. She feels extremely weak and exhausted, consuming Pedialyte, [employer] Water, and regular water to stay hydrated. She also mentioned taking probiotics. On Sunday night, she experienced severe leg cramps, which have not recurred since, attributing them to potential potassium loss due to diarrhea and vomiting.  Nausea and vomiting began on Friday night, with vomiting occurring eight to nine times. On Saturday morning, she had uncontrollable diarrhea from 4 AM to 9 AM, occurring at least six times. She vomited again on Saturday after drinking  Gatorade.  She has had a sore throat since Friday, rated as a 3 out of 10 in severity. The sore throat is steady and slightly painful but improving. She also reports head congestion, with her nose being congested rather than runny, and a minor cough. No fever, headaches, body aches, breathing difficulties, chest pain, or palpitations. She has not taken any antibiotics in the past year.  Additionally, she mentions a cyst under her eye, present for about a month and appearing to get larger. The cyst is not painful but is concerning to her.    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   Constipation    Decreased hearing    Diabetes (HCC)    Diverticulitis    Dry eyes    Fatigue    Floaters in visual field    Gall bladder disease    GERD (gastroesophageal reflux disease)    Glaucoma    Hiatal hernia with gastroesophageal reflux    History of blood transfusion    for Knee replacement   Hyperlipidemia, mild 04/02/2016   Hypertension    Hypothyroidism    Joint pain    Migraine aura without headache    Muscle pain    Muscle stiffness    Nasal congestion    Obesity 04/02/2016   Osteoarthritis    Parotiditis 04/21/2016   Pre-diabetes    Vitamin D deficiency 04/02/2016    Past Surgical History:  Procedure Laterality Date   APPENDECTOMY  BREAST LUMPECTOMY WITH RADIOACTIVE SEED LOCALIZATION Left 01/21/2021   Procedure: LEFT BREAST LUMPECTOMY WITH RADIOACTIVE SEED LOCALIZATION;  Surgeon: Abigail Miyamoto, MD;  Location: [facility];  Service: General;  Laterality: Left;   cataract surgery  2008   CHOLECYSTECTOMY  2010   JOINT REPLACEMENT Left    2012   MASTOIDECTOMY     POSTERIOR CERVICAL FUSION/FORAMINOTOMY N/A 04/27/2017   Procedure: CERVICAL FIVE-SIX  OPEN REDUCTION OF FRACTURE, CERVICAL THREE-SEVEN  DORSAL FIXATION AND FUSION;  Surgeon: Ditty, Loura Halt, MD;  Location: [facility] OR;  Service: Neurosurgery;  Laterality: N/A;   SHOULDER ARTHROSCOPY Bilateral prior to 2010   TONSILLECTOMY     TOTAL HIP ARTHROPLASTY Right 03/22/2015   Procedure: RIGHT TOTAL HIP ARTHROPLASTY ANTERIOR APPROACH;  Surgeon: Jodi Geralds, MD;  Location: [facility] OR;  Service: Orthopedics;  Laterality: Right;   TOTAL KNEE ARTHROPLASTY Bilateral 2007   TUBAL LIGATION      Family History  Problem Relation Age of Onset   Dementia Mother    Hypertension Mother    Obesity Mother    Diabetes Mother    Heart disease Father    Hypertension Father    Stroke Father    Diabetes Father    Hyperlipidemia Father    Alcoholism Father    Obesity Father    Cancer Maternal Grandfather        colon cancer   Arthritis Daughter     Social History   Socioeconomic History   Marital status: Widowed    Spouse name: Not on file   Number of children: Not on file   Years of education: Not on file   Highest education level: Not on file  Occupational History   Occupation: Retired  Tobacco Use   Smoking status: Never   Smokeless tobacco: Never   Tobacco comments:    Stopped 50 years ago.   Vaping Use   Vaping status: Never Used  Substance and Sexual Activity   Alcohol use: Yes    Alcohol/week: 0.0 standard drinks of alcohol    Comment: socially 3-4x/wk   Drug use: No   Sexual activity: Not Currently  Other Topics Concern   Not on file  Social History Narrative   Lives at [facility], widowed 7 years, no dietary restrictions. Retired from neuropsychiatry work.    Social Drivers of Corporate investment banker Strain: Low Risk  (10/21/2022)   Overall Financial Resource Strain (CARDIA)    Difficulty of Paying Living Expenses: Not hard at all  Food Insecurity: No Food Insecurity (10/21/2022)   Hunger Vital Sign    Worried About Running Out of Food in the Last Year: Never true    Ran Out of Food in the Last Year: Never true  Transportation Needs: No Transportation Needs  (10/21/2022)   PRAPARE - Administrator, Civil Service (Medical): No    Lack of Transportation (Non-Medical): No  Physical Activity: Sufficiently Active (10/21/2022)   Exercise Vital Sign    Days of Exercise per Week: 3 days    Minutes of Exercise per Session: 60 min  Stress: No Stress Concern Present (10/21/2022)   [employer] of Occupational Health - Occupational Stress Questionnaire    Feeling of Stress : Not at all  Social Connections: Moderately Isolated (10/21/2022)   Social Connection and Isolation Panel [NHANES]    Frequency of Communication with Friends and Family: More than three times a week    Frequency of Social Gatherings with Friends and Family:  More than three times a week    Attends Religious Services: Never    Active Member of Clubs or Organizations: Yes    Attends Banker Meetings: 1 to 4 times per year    Marital Status: Widowed  Intimate Partner Violence: Not At Risk (10/21/2022)   Humiliation, Afraid, Rape, and Kick questionnaire    Fear of Current or Ex-Partner: No    Emotionally Abused: No    Physically Abused: No    Sexually Abused: No    Outpatient Medications Prior to Visit  Medication Sig Dispense Refill   anastrozole (ARIMIDEX) 1 MG tablet TAKE 1 TABLET BY MOUTH DAILY 100 tablet 2   Biotin 21308 MCG TABS Take 1 tablet by mouth daily.     Blood Glucose Monitoring Suppl DEVI 1 each by Does not apply route in the morning, at noon, and at bedtime. May substitute to any manufacturer covered by patient's insurance. 1 each 0   brimonidine (ALPHAGAN) 0.2 % ophthalmic solution Place 1 drop into both eyes 2 (two) times daily. 5 mL 12   Calcium Carb-Cholecalciferol 600-10 MG-MCG TABS Take 1 tablet by mouth daily.     famotidine (PEPCID) 40 MG tablet TAKE 1 TABLET BY MOUTH AT  BEDTIME AS NEEDED FOR HEARTBURN  OR INDIGESTION (Patient taking differently: Take 40 mg by mouth at bedtime.) 100 tablet 2   fluticasone (FLONASE) 50 MCG/ACT nasal spray  Place 2 sprays into both nostrils daily. 16 g 6   latanoprost (XALATAN) 0.005 % ophthalmic solution Place 1 drop into both eyes at bedtime.     levothyroxine (SYNTHROID) 125 MCG tablet TAKE 1 TABLET BY MOUTH DAILY  BEFORE BREAKFAST 100 tablet 2   losartan (COZAAR) 50 MG tablet TAKE 1 TABLET BY MOUTH DAILY 100 tablet 2   MAGNESIUM-POTASSIUM PO Take 1 tablet by mouth daily.     Multiple Vitamins-Minerals (PRESERVISION AREDS PO) Take 1 tablet by mouth daily.     omeprazole (PRILOSEC) 40 MG capsule TAKE 1 CAPSULE BY MOUTH DAILY 100 capsule 2   rosuvastatin (CRESTOR) 5 MG tablet Take 1 tablet (5 mg total) by mouth daily. 90 tablet 1   Vitamin D, Cholecalciferol, 25 MCG (1000 UT) CAPS Take 1 capsule by mouth daily.     No facility-administered medications prior to visit.    No Known Allergies  Review of Systems  Constitutional:  Positive for malaise/fatigue. Negative for fever.  HENT:  Positive for congestion and sore throat.   Eyes:  Negative for blurred vision.  Respiratory:  Negative for cough, sputum production and shortness of breath.   Cardiovascular:  Negative for chest pain, palpitations and leg swelling.  Gastrointestinal:  Positive for diarrhea, nausea and vomiting. Negative for abdominal pain, blood in stool and constipation.  Genitourinary:  Negative for dysuria and frequency.  Musculoskeletal:  Negative for falls and myalgias.  Skin:  Negative for rash.  Neurological:  Negative for dizziness, loss of consciousness and headaches.  Endo/Heme/Allergies:  Negative for environmental allergies.  Psychiatric/Behavioral:  Negative for depression. The patient is not nervous/anxious.        Objective:    Physical Exam Constitutional:      General: She is not in acute distress.    Appearance: Normal appearance. She is not ill-appearing or toxic-appearing.  HENT:     Head: Normocephalic and atraumatic.     Right Ear: External ear normal.     Left Ear: External ear normal.      Nose: Nose normal.  Eyes:  General:        Right eye: No discharge.        Left eye: No discharge.  Pulmonary:     Effort: Pulmonary effort is normal.  Skin:    Findings: No rash.  Neurological:     Mental Status: She is alert and oriented to person, place, and time.  Psychiatric:        Behavior: Behavior normal.     There were no vitals taken for this visit. Wt Readings from Last 3 Encounters:  11/01/23 190 lb (86.2 kg)  10/25/23 193 lb (87.5 kg)  10/14/23 195 lb 6.4 oz (88.6 kg)    Diabetic Foot Exam - Simple   No data filed    Lab Results  Component Value Date   WBC 5.1 10/14/2023   HGB 14.0 10/14/2023   HCT 41.3 10/14/2023   PLT 226.0 10/14/2023   GLUCOSE 96 10/14/2023   CHOL 173 10/14/2023   TRIG 100.0 10/14/2023   HDL 71.30 10/14/2023   LDLDIRECT 138.0 05/15/2019   LDLCALC 82 10/14/2023   ALT 15 10/14/2023   AST 23 10/14/2023   NA 136 10/14/2023   K 4.9 10/14/2023   CL 97 10/14/2023   CREATININE 0.74 10/14/2023   BUN 14 10/14/2023   CO2 30 10/14/2023   TSH 36.33 (H) 10/14/2023   INR 1.05 04/28/2017   HGBA1C 6.1 10/14/2023   MICROALBUR 1.0 10/14/2023    Lab Results  Component Value Date   TSH 36.33 (H) 10/14/2023   Lab Results  Component Value Date   WBC 5.1 10/14/2023   HGB 14.0 10/14/2023   HCT 41.3 10/14/2023   MCV 96.9 10/14/2023   PLT 226.0 10/14/2023   Lab Results  Component Value Date   NA 136 10/14/2023   K 4.9 10/14/2023   CO2 30 10/14/2023   GLUCOSE 96 10/14/2023   BUN 14 10/14/2023   CREATININE 0.74 10/14/2023   BILITOT 0.6 10/14/2023   ALKPHOS 101 10/14/2023   AST 23 10/14/2023   ALT 15 10/14/2023   PROT 7.0 10/14/2023   ALBUMIN 4.7 10/14/2023   CALCIUM 9.8 10/14/2023   ANIONGAP 11 03/02/2022   EGFR 65 08/11/2023   GFR 71.61 10/14/2023   Lab Results  Component Value Date   CHOL 173 10/14/2023   Lab Results  Component Value Date   HDL 71.30 10/14/2023   Lab Results  Component Value Date   LDLCALC 82  10/14/2023   Lab Results  Component Value Date   TRIG 100.0 10/14/2023   Lab Results  Component Value Date   CHOLHDL 2 10/14/2023   Lab Results  Component Value Date   HGBA1C 6.1 10/14/2023       Assessment & Plan:  There are no diagnoses linked to this encounter.  Assessment and Plan Assessment & Plan Acute Gastroenteritis Symptoms of nausea, vomiting, and diarrhea have resolved. Sore throat and head congestion persist but are improving. Likely viral etiology; antibiotics not indicated. Blood work deferred unless symptoms return or urine remains dark. - Encourage fluid intake, especially Pedialyte. - Advise BRAT diet to ease gastrointestinal symptoms. - Monitor for return of vomiting or diarrhea; consider blood work if symptoms return. - Advise adding lean proteins like plain chicken if symptoms improve. - Monitor urine color; consider blood work if it remains dark.  Eyelid Cyst Round cyst under the eye, present for a month, possibly increasing in size. Not painful but concerning. - Refer to an ophthalmologist for evaluation.   Patient has an  ophthalmologist and will contact them  Danise Edge, MD

## 2023-12-02 ENCOUNTER — Other Ambulatory Visit: Payer: Medicare Other

## 2023-12-06 ENCOUNTER — Ambulatory Visit (HOSPITAL_BASED_OUTPATIENT_CLINIC_OR_DEPARTMENT_OTHER): Payer: Medicare Other

## 2023-12-06 ENCOUNTER — Encounter (HOSPITAL_BASED_OUTPATIENT_CLINIC_OR_DEPARTMENT_OTHER): Payer: Self-pay

## 2023-12-06 ENCOUNTER — Ambulatory Visit (HOSPITAL_BASED_OUTPATIENT_CLINIC_OR_DEPARTMENT_OTHER)
Admission: RE | Admit: 2023-12-06 | Discharge: 2023-12-06 | Disposition: A | Discharge status: Home / Self Care | Source: Ambulatory Visit | Attending: Hematology and Oncology | Admitting: Hematology and Oncology

## 2023-12-06 DIAGNOSIS — Z1231 Encounter for screening mammogram for malignant neoplasm of breast: Secondary | ICD-10-CM | POA: Diagnosis not present

## 2023-12-06 DIAGNOSIS — H353211 Exudative age-related macular degeneration, right eye, with active choroidal neovascularization: Secondary | ICD-10-CM | POA: Diagnosis not present

## 2023-12-06 DIAGNOSIS — Z853 Personal history of malignant neoplasm of breast: Secondary | ICD-10-CM | POA: Diagnosis not present

## 2023-12-14 DIAGNOSIS — Z129 Encounter for screening for malignant neoplasm, site unspecified: Secondary | ICD-10-CM | POA: Diagnosis not present

## 2023-12-14 DIAGNOSIS — L821 Other seborrheic keratosis: Secondary | ICD-10-CM | POA: Diagnosis not present

## 2023-12-14 DIAGNOSIS — L820 Inflamed seborrheic keratosis: Secondary | ICD-10-CM | POA: Diagnosis not present

## 2023-12-14 DIAGNOSIS — L814 Other melanin hyperpigmentation: Secondary | ICD-10-CM | POA: Diagnosis not present

## 2023-12-14 DIAGNOSIS — D485 Neoplasm of uncertain behavior of skin: Secondary | ICD-10-CM | POA: Diagnosis not present

## 2023-12-14 DIAGNOSIS — L218 Other seborrheic dermatitis: Secondary | ICD-10-CM | POA: Diagnosis not present

## 2023-12-15 ENCOUNTER — Ambulatory Visit: Payer: Medicare Other | Admitting: Nurse Practitioner

## 2023-12-27 ENCOUNTER — Ambulatory Visit (INDEPENDENT_AMBULATORY_CARE_PROVIDER_SITE_OTHER): Admitting: Nurse Practitioner

## 2023-12-27 ENCOUNTER — Encounter: Payer: Self-pay | Admitting: Nurse Practitioner

## 2023-12-27 VITALS — BP 149/78 | HR 67 | Temp 98.0°F | Ht 65.0 in | Wt 192.0 lb

## 2023-12-27 DIAGNOSIS — E66811 Obesity, class 1: Secondary | ICD-10-CM

## 2023-12-27 DIAGNOSIS — R32 Unspecified urinary incontinence: Secondary | ICD-10-CM | POA: Diagnosis not present

## 2023-12-27 DIAGNOSIS — E039 Hypothyroidism, unspecified: Secondary | ICD-10-CM | POA: Diagnosis not present

## 2023-12-27 DIAGNOSIS — Z7985 Long-term (current) use of injectable non-insulin antidiabetic drugs: Secondary | ICD-10-CM | POA: Diagnosis not present

## 2023-12-27 DIAGNOSIS — Z7984 Long term (current) use of oral hypoglycemic drugs: Secondary | ICD-10-CM

## 2023-12-27 DIAGNOSIS — R5383 Other fatigue: Secondary | ICD-10-CM

## 2023-12-27 DIAGNOSIS — E1169 Type 2 diabetes mellitus with other specified complication: Secondary | ICD-10-CM

## 2023-12-27 DIAGNOSIS — R7989 Other specified abnormal findings of blood chemistry: Secondary | ICD-10-CM

## 2023-12-27 DIAGNOSIS — Z6831 Body mass index (BMI) 31.0-31.9, adult: Secondary | ICD-10-CM

## 2023-12-27 DIAGNOSIS — Z79899 Other long term (current) drug therapy: Secondary | ICD-10-CM | POA: Diagnosis not present

## 2023-12-27 DIAGNOSIS — E6609 Other obesity due to excess calories: Secondary | ICD-10-CM

## 2023-12-27 NOTE — Progress Notes (Signed)
 Office: [phone]  /  Fax: [phone]  WEIGHT SUMMARY AND BIOMETRICS  Weight Lost Since Last Visit: 0lb  Weight Gained Since Last Visit: 2lb   Vitals Temp: 98 F (36.7 C) BP: (!) 149/78 Pulse Rate: 67 SpO2: 98 %   Anthropometric Measurements Height: 5\' 5"  (1.651 m) Weight: 192 lb (87.1 kg) BMI (Calculated): 31.95 Weight at Last Visit: 190lb Weight Lost Since Last Visit: 0lb Weight Gained Since Last Visit: 2lb Starting Weight: 211lb Total Weight Loss (lbs): 19 lb (8.618 kg)   Body Composition  Body Fat %: 46 % Fat Mass (lbs): 88.4 lbs Muscle Mass (lbs): 98.4 lbs Total Body Water (lbs): 74 lbs Visceral Fat Rating : 15   Other Clinical Data Fasting: Yes Labs: No Today's Visit #: 15 Starting Date: 10/26/22     HPI  Chief Complaint: OBESITY  Catherine Coleman is here to discuss her progress with her obesity treatment plan. She is on the the Category 2 Plan and states she is following her eating plan approximately 0 % of the time. She states she is exercising 60 minutes 2 days per week.   Interval History:  Since last office visit she has gained 2 pounds. She struggles with polyphagia and cravings.  She is drinking water but not enough.    Her highest weight was 224 lbs.    Pharmacotherapy for weight loss: She is not currently taking medications  for medical weight loss.  Pharmacotherapy for DMT2:  She is not currently taking medications. She was taking Ozempic and I stopped it after her visit on 10/07/23 due to side effects of vomiting, nausea and GERD. She continues to struggle with GERD, notes vomiting once since stopping Ozempic. Not taking Pepcid.  She saw Dr. Rodrick Clapper on 10/14/23 and most recently on 11/30/23.  She has a follow up visit scheduled on 03/20/24.  Last A1c was 6.1 She is not checking BS at home.   Episodes of hypoglycemia: no On ACE and statin.  Last eye exam:  2024 She has tried Ozempic and Metformin in the past. Denied side effects while taking  Metformin.  She would like to restart taking Metformin and or Ozempic.     Lab Results  Component Value Date   HGBA1C 6.1 10/14/2023   HGBA1C 5.9 06/01/2023   HGBA1C 6.0 01/15/2023   Lab Results  Component Value Date   MICROALBUR 1.0 10/14/2023   LDLCALC 82 10/14/2023   CREATININE 0.74 10/14/2023    Urinary incontinence Saw Dr. Rodrick Clapper last on 10/14/23.   Notes it's getting worse.  She has gone to PT twice.  Doesn't feel that PT was beneficial.   Saw Dr. Frutoso Jing last 2/24.  She is currently taking Myrbetriq 50mg .    Notes copied from Dr. Doyne Genin note on 10/14/23 "Urinary Incontinence Patient reports nocturnal leakage despite Myrbetriq use. Previous consultation with urogynecologist. -Continue Myrbetriq until supply is depleted. -Consider switching to VESIcare or another medication in the same family. -Consider re-referral to urogynecologist for further evaluation."  Hypothyroidism Stable.  Does not report symptoms associated with uncontrolled hypothyroidism. Medication(s): Levothyroxine 125 mcg daily. Notes fatigue Taking meds daily.   Was supposed to have labs rechecked in 4-6 weeks.   Lab Results  Component Value Date   TSH 36.33 (H) 10/14/2023   Low Vit B12 She is taking a MVI daily.  Not taking Vit B12.  Reports fatigue  PHYSICAL EXAM:  Blood pressure (!) 149/78, pulse 67, temperature 98 F (36.7 C), height 5\' 5"  (1.651 m), weight 192  lb (87.1 kg), SpO2 98%. Body mass index is 31.95 kg/m.  General: She is overweight, cooperative, alert, well developed, and in no acute distress. PSYCH: Has normal mood, affect and thought process.   Extremities: No edema.  Neurologic: No gross sensory or motor deficits. No tremors or fasciculations noted.    DIAGNOSTIC DATA REVIEWED:  BMET    Component Value Date/Time   NA 136 10/14/2023 1414   NA 137 08/11/2023 0906   K 4.9 10/14/2023 1414   CL 97 10/14/2023 1414   CO2 30 10/14/2023 1414   GLUCOSE 96 10/14/2023 1414   BUN  14 10/14/2023 1414   BUN 14 08/11/2023 0906   CREATININE 0.74 10/14/2023 1414   CREATININE 0.76 01/02/2022 1331   CALCIUM 9.8 10/14/2023 1414   GFRNONAA >60 03/02/2022 1307   GFRNONAA >60 01/02/2022 1331   GFRAA 94 09/28/2018 1147   Lab Results  Component Value Date   HGBA1C 6.1 10/14/2023   HGBA1C 6.4 10/18/2017   Lab Results  Component Value Date   INSULIN 7.9 10/26/2022   INSULIN 15.5 05/30/2018   Lab Results  Component Value Date   TSH 36.33 (H) 10/14/2023   CBC    Component Value Date/Time   WBC 5.1 10/14/2023 1414   RBC 4.27 10/14/2023 1414   HGB 14.0 10/14/2023 1414   HGB 13.0 01/02/2022 1331   HCT 41.3 10/14/2023 1414   PLT 226.0 10/14/2023 1414   PLT 197 01/02/2022 1331   MCV 96.9 10/14/2023 1414   MCH 31.9 08/12/2023 0829   MCHC 34.0 10/14/2023 1414   RDW 14.2 10/14/2023 1414   Iron Studies No results found for: "IRON", "TIBC", "FERRITIN", "IRONPCTSAT" Lipid Panel     Component Value Date/Time   CHOL 173 10/14/2023 1414   CHOL 226 (H) 09/28/2018 1147   TRIG 100.0 10/14/2023 1414   HDL 71.30 10/14/2023 1414   HDL 60 09/28/2018 1147   CHOLHDL 2 10/14/2023 1414   VLDL 20.0 10/14/2023 1414   LDLCALC 82 10/14/2023 1414   LDLCALC 134 (H) 09/28/2018 1147   LDLDIRECT 138.0 05/15/2019 1022   Hepatic Function Panel     Component Value Date/Time   PROT 7.0 10/14/2023 1414   PROT 6.7 09/28/2018 1147   ALBUMIN 4.7 10/14/2023 1414   ALBUMIN 4.6 09/28/2018 1147   AST 23 10/14/2023 1414   AST 20 01/02/2022 1331   ALT 15 10/14/2023 1414   ALT 19 01/02/2022 1331   ALKPHOS 101 10/14/2023 1414   BILITOT 0.6 10/14/2023 1414   BILITOT 0.3 01/02/2022 1331      Component Value Date/Time   TSH 36.33 (H) 10/14/2023 1414   Nutritional Lab Results  Component Value Date   VD25OH 34.71 10/14/2023   VD25OH 62.50 01/15/2023   VD25OH 41.92 10/12/2022     ASSESSMENT AND PLAN  TREATMENT PLAN FOR OBESITY:  Recommended Dietary Goals  Catherine Coleman is currently  in the action stage of change. As such, her goal is to continue weight management plan. She has agreed to the Category 2 Plan.  Behavioral Intervention  We discussed the following Behavioral Modification Strategies today: increasing lean protein intake to established goals, decreasing simple carbohydrates , increasing vegetables, increasing fiber rich foods, increasing water intake , work on meal planning and preparation, reading food labels , keeping healthy foods at home, continue to practice mindfulness when eating, planning for success, and continue to work on maintaining a reduced calorie state, getting the recommended amount of protein, incorporating whole foods, making healthy choices, staying  well hydrated and practicing mindfulness when eating..  Additional resources provided today: NA  Recommended Physical Activity Goals  Reginae has been advised to work up to 150 minutes of moderate intensity aerobic activity a week and strengthening exercises 2-3 times per week for cardiovascular health, weight loss maintenance and preservation of muscle mass.   She has agreed to Think about enjoyable ways to increase daily physical activity and overcoming barriers to exercise, Increase physical activity in their day and reduce sedentary time (increase NEAT)., Increase the intensity, frequency or duration of strengthening exercises , and Increase the intensity, frequency or duration of aerobic exercises     Pharmacotherapy She is not a candidate for Phentermine or Qsymia   ASSOCIATED CONDITIONS ADDRESSED TODAY  Action/Plan  Type 2 diabetes mellitus with obesity (HCC) -     Hemoglobin A1c  Advised not to restart taking Ozempic Patient has Metformin at home and would like to restart taking it.   Her Vit B12 was wnl but lo I've recommended starting Vit B12 OTC.    Urinary incontinence, unspecified type To call and reschedule appt with urogyn  Hypothyroidism, unspecified type -      TSH Will obtain labs today since not obtained at PCP's office and due to complaints of fatigue  Medication management -     Comprehensive metabolic panel with GFR  Low Vit B12 To start Vit B12 OTC  Other fatigue -     TSH -     CBC with Differential/Platelet  Class 1 obesity due to excess calories with serious comorbidity and body mass index (BMI) of 31.0 to 31.9 in adult          Return in about 4 weeks (around 01/24/2024).Aaron Aas She was informed of the importance of frequent follow up visits to maximize her success with intensive lifestyle modifications for her multiple health conditions.   ATTESTASTION STATEMENTS:  Reviewed by clinician on day of visit: allergies, medications, problem list, medical history, surgical history, family history, social history, and previous encounter notes.     Crist Dominion. Johnny Gorter FNP-C

## 2023-12-28 ENCOUNTER — Encounter: Payer: Self-pay | Admitting: Nurse Practitioner

## 2023-12-28 LAB — COMPREHENSIVE METABOLIC PANEL WITH GFR
ALT: 20 IU/L (ref 0–32)
AST: 29 IU/L (ref 0–40)
Albumin: 4.4 g/dL (ref 3.7–4.7)
Alkaline Phosphatase: 116 IU/L (ref 44–121)
BUN/Creatinine Ratio: 19 (ref 12–28)
BUN: 13 mg/dL (ref 8–27)
Bilirubin Total: 0.3 mg/dL (ref 0.0–1.2)
CO2: 23 mmol/L (ref 20–29)
Calcium: 9.3 mg/dL (ref 8.7–10.3)
Chloride: 97 mmol/L (ref 96–106)
Creatinine, Ser: 0.7 mg/dL (ref 0.57–1.00)
Globulin, Total: 2.1 g/dL (ref 1.5–4.5)
Glucose: 120 mg/dL — ABNORMAL HIGH (ref 70–99)
Potassium: 5 mmol/L (ref 3.5–5.2)
Sodium: 136 mmol/L (ref 134–144)
Total Protein: 6.5 g/dL (ref 6.0–8.5)
eGFR: 83 mL/min/{1.73_m2} (ref >59)

## 2023-12-28 LAB — CBC WITH DIFFERENTIAL/PLATELET
Basophils Absolute: 0 10*3/uL (ref 0.0–0.2)
Basos: 1 %
EOS (ABSOLUTE): 0.1 10*3/uL (ref 0.0–0.4)
Eos: 3 %
Hematocrit: 39.6 % (ref 34.0–46.6)
Hemoglobin: 13.1 g/dL (ref 11.1–15.9)
Immature Grans (Abs): 0 10*3/uL (ref 0.0–0.1)
Immature Granulocytes: 1 %
Lymphocytes Absolute: 1.1 10*3/uL (ref 0.7–3.1)
Lymphs: 25 %
MCH: 32.7 pg (ref 26.6–33.0)
MCHC: 33.1 g/dL (ref 31.5–35.7)
MCV: 99 fL — ABNORMAL HIGH (ref 79–97)
Monocytes Absolute: 0.4 10*3/uL (ref 0.1–0.9)
Monocytes: 10 %
Neutrophils Absolute: 2.6 10*3/uL (ref 1.4–7.0)
Neutrophils: 60 %
Platelets: 200 10*3/uL (ref 150–450)
RBC: 4.01 x10E6/uL (ref 3.77–5.28)
RDW: 13.1 % (ref 11.7–15.4)
WBC: 4.3 10*3/uL (ref 3.4–10.8)

## 2023-12-28 LAB — TSH: TSH: 23.3 u[IU]/mL — ABNORMAL HIGH (ref 0.450–4.500)

## 2023-12-28 LAB — HEMOGLOBIN A1C
Est. average glucose Bld gHb Est-mCnc: 123 mg/dL
Hgb A1c MFr Bld: 5.9 % — ABNORMAL HIGH (ref 4.8–5.6)

## 2023-12-28 NOTE — Telephone Encounter (Signed)
 Patient reports she has been taking biotin and stopped taking today.

## 2023-12-29 ENCOUNTER — Ambulatory Visit: Admitting: Family Medicine

## 2023-12-29 MED ORDER — LEVOTHYROXINE SODIUM 137 MCG PO CAPS: 1.0000 | ORAL_CAPSULE | Freq: Every day | ORAL | 0 refills | Status: DC

## 2023-12-29 NOTE — Addendum Note (Signed)
 Addended by: Katherine Tout L on: 12/29/2023 10:32 AM   Modules accepted: Orders

## 2023-12-29 NOTE — Telephone Encounter (Signed)
 Neda Balk, MD to Jullie Oiler D, [facility]     12/28/23  9:39 PM Please let her know we need to increase her Levothyroxine dose to 137 mcg tabs, 1 tab daily disp #100 and recheck labs tsh in 3 months with visit after that.   Called patient and let her know this information. She is okay with that and wants meds sent to [facility]. RX sent.

## 2024-01-10 DIAGNOSIS — H353211 Exudative age-related macular degeneration, right eye, with active choroidal neovascularization: Secondary | ICD-10-CM | POA: Diagnosis not present

## 2024-01-11 ENCOUNTER — Ambulatory Visit: Admitting: Obstetrics and Gynecology

## 2024-01-11 ENCOUNTER — Encounter: Payer: Self-pay | Admitting: Obstetrics and Gynecology

## 2024-01-11 VITALS — BP 103/68 | HR 67

## 2024-01-11 DIAGNOSIS — N3281 Overactive bladder: Secondary | ICD-10-CM

## 2024-01-11 NOTE — Progress Notes (Signed)
 [facility] Return Visit  SUBJECTIVE  History of Present Illness: Catherine Coleman is a 89 y.o. female seen in follow-up for mixed incontinence, urge predominant. She has been on Myrbetriq  50mg  daily.   She is still taking the myrbetriq . When she wakes up at night she has large amount of leakage. Wakes 1-2 times at night. Denies sleep apnea. During the day, she voids 5 times, and can generally control the urge. But when the bladder gets really full, it will release. Stopped drinking water to avoid accidents. Denies leakage with cough or sneeze.   Past Medical History: Patient  has a past medical history of Arthritis, Atrophic rhinitis (04/21/2016), Back pain, Breast cancer (HCC) (12/2020), Constipation, Decreased hearing, Diabetes (HCC), Diverticulitis, Dry eyes, Fatigue, Floaters in visual field, Gall bladder disease, GERD (gastroesophageal reflux disease), Glaucoma, Hiatal hernia with gastroesophageal reflux, History of blood transfusion, Hyperlipidemia, mild (04/02/2016), Hypertension, Hypothyroidism, Joint pain, Migraine aura without headache, Muscle pain, Muscle stiffness, Nasal congestion, Obesity (04/02/2016), Osteoarthritis, Parotiditis (04/21/2016), Pre-diabetes, and Vitamin D  deficiency (04/02/2016).   Past Surgical History: She  has a past surgical history that includes Total knee arthroplasty (Bilateral, 2007); Shoulder arthroscopy (Bilateral, prior to 2010); Joint replacement (Left); Cholecystectomy (2010); Appendectomy; Tonsillectomy; Total hip arthroplasty (Right, 03/22/2015); cataract surgery (2008); Tubal ligation; Posterior cervical fusion/foraminotomy (N/A, 04/27/2017); Mastoidectomy; and Breast lumpectomy with radioactive seed localization (Left, 01/21/2021).   Medications: She has a current medication list which includes the following prescription(s): anastrozole , biotin, blood glucose monitoring suppl, brimonidine , calcium  carb-cholecalciferol, famotidine , fluticasone ,  ketoconazole, latanoprost, levothyroxine  sodium, losartan , magnesium -potassium, multiple vitamins-minerals, omeprazole , rosuvastatin , vitamin d  (cholecalciferol), and myrbetriq .   Allergies: Patient has no known allergies.   Social History: Patient  reports that she has never smoked. She has never used smokeless tobacco. She reports current alcohol use. She reports that she does not use drugs.     OBJECTIVE     Physical Exam: Vitals:   01/11/24 1125  BP: 103/68  Pulse: 67   Gen: No apparent distress, A&O x 3.  Detailed Urogynecologic Evaluation:  Deferred.    ASSESSMENT AND PLAN    [name] is a 89 y.o. with:  1. Overactive bladder     - For refractory OAB we reviewed the procedure for intravesical Botox injection with cystoscopy in the office and reviewed the risks, benefits and alternatives of treatment including but not limited to infection, need for self-catheterization and need for repeat therapy.  We discussed that there is a 5-15% chance of needing to catheterize with Botox and that this usually resolves in a few months; however can persist for longer periods of time.  Typically Botox injections would need to be repeated every 3-12 months since this is not a permanent therapy.  - We discussed the role of sacral neuromodulation and how it works. It requires a test phase, and documentation of bladder function via diary. After a successful test period, a permanent wire and generator are placed in the OR. The battery lasts 5 years on average and would need to be replaced surgically.  The goal of this therapy is at least a 50% improvement in symptoms. It is NOT realistic to expect a 100% cure.  - - - We also discussed the role of percutaneous tibial nerve stimulation and how it works.  She understands it requires 12 weekly visits for temporary neuromodulation of the sacral nerve roots via the tibial nerve and that she may then require continued tapered treatment.   - She is  interested in a  nerve stimulation therapy and prefers to start with PTNS after she returns from her trips. Since she has seen some improvement in symptoms with myrbetriq , she would like to continue with the medication while doing the nerve stimulation for combination therapy. - Provided with baseline bladder diary to fill out prior to first session.    Arma Lamp, MD

## 2024-01-17 ENCOUNTER — Telehealth: Payer: Self-pay

## 2024-01-17 DIAGNOSIS — H401421 Capsular glaucoma with pseudoexfoliation of lens, left eye, mild stage: Secondary | ICD-10-CM | POA: Diagnosis not present

## 2024-01-17 DIAGNOSIS — H401412 Capsular glaucoma with pseudoexfoliation of lens, right eye, moderate stage: Secondary | ICD-10-CM | POA: Diagnosis not present

## 2024-01-17 DIAGNOSIS — H04123 Dry eye syndrome of bilateral lacrimal glands: Secondary | ICD-10-CM | POA: Diagnosis not present

## 2024-01-17 NOTE — Telephone Encounter (Signed)
 Left message on voicemail about appointment on 01/18/24

## 2024-01-18 ENCOUNTER — Inpatient Hospital Stay: Payer: Medicare Other | Attending: Hematology and Oncology | Admitting: Hematology and Oncology

## 2024-01-18 VITALS — BP 142/67 | HR 84 | Temp 97.3°F | Resp 17 | Wt 196.8 lb

## 2024-01-18 DIAGNOSIS — R32 Unspecified urinary incontinence: Secondary | ICD-10-CM | POA: Insufficient documentation

## 2024-01-18 DIAGNOSIS — Z170 Estrogen receptor positive status [ER+]: Secondary | ICD-10-CM | POA: Diagnosis not present

## 2024-01-18 DIAGNOSIS — H353210 Exudative age-related macular degeneration, right eye, stage unspecified: Secondary | ICD-10-CM | POA: Diagnosis not present

## 2024-01-18 DIAGNOSIS — M810 Age-related osteoporosis without current pathological fracture: Secondary | ICD-10-CM | POA: Insufficient documentation

## 2024-01-18 DIAGNOSIS — I719 Aortic aneurysm of unspecified site, without rupture: Secondary | ICD-10-CM | POA: Insufficient documentation

## 2024-01-18 DIAGNOSIS — C50312 Malignant neoplasm of lower-inner quadrant of left female breast: Secondary | ICD-10-CM | POA: Diagnosis not present

## 2024-01-18 DIAGNOSIS — Z79811 Long term (current) use of aromatase inhibitors: Secondary | ICD-10-CM | POA: Diagnosis not present

## 2024-01-18 NOTE — Progress Notes (Signed)
 [facility]  [phone] [phone]     ID: Catherine Coleman DOB: 03/18/1932  MR#: [id number]  JYN#:[id number]  Patient Care Team: Neda Balk, MD as PCP - General (Family Medicine) Croitoru, Karyl Paget, MD as PCP - Cardiology (Cardiology) Auther Bo, RN as Oncology Nurse Navigator Alane Hsu, RN as Oncology Nurse Navigator Magrinat, Rozella Cornfield, MD (Inactive) as Consulting Physician (Oncology) Oza Blumenthal, MD as Consulting Physician (General Surgery) Johna Myers, MD as Consulting Physician (Radiation Oncology) Adan Holms, MD as Consulting Physician (Obstetrics and Gynecology) Alejandra Amos, MD as Consulting Physician (Rehabilitation) Jewell, Jolene R, MD as Consulting Physician (Dermatology) Cecilie Coffee, RPH-CPP (Pharmacist) Murleen Arms, MD  CHIEF COMPLAINT: Estrogen receptor positive breast cancer  CURRENT TREATMENT: anastrozole   INTERVAL HISTORY:  Discussed the use of AI scribe software for clinical note transcription with the patient, who gave verbal consent to proceed.  History of Present Illness Catherine Coleman is an 89 year old female with wet macular degeneration and osteoporosis who presents for follow-up on her ongoing treatments.  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 Her vision remains stable, with no improvement or worsening of her condition.  She has a history of breast cancer and has been taking anastrozole  since August 2022, with no new side effects. A recent mammogram showed post-treatment changes in her left breast.  She experiences urinary incontinence and is taking Myrbetriq , which has not been effective. She mentions seeing a specialist who suggested weekly ankle injections for twelve weeks, followed by monthly injections.  She was diagnosed with osteoporosis, which was slightly worse in her arm compared to previous bone density tests. She does not  take calcium  supplements but consumes dairy products like cheese, milk, and yogurt.  She has an aortic aneurysm that has remained stable over time and is being monitored. Additionally, she mentions having only one functioning electrical signal in her heart, but her heart rate remains stable at 84 bpm.  Rest of the pertinent 10 ROS reviewed and negative.    COVID 19 VACCINATION STATUS: Status post Moderna x2 with Moderna then [employer] boosters   HISTORY OF CURRENT ILLNESS: From the original intake note:  Catherine Coleman had routine screening mammography on 11/13/2020 showing a possible abnormality in the bilateral breasts. She underwent bilateral diagnostic mammography with tomography and left breast ultrasonography at [facility] on 12/02/2020 showing: breast density category B; 10 mm left breast mass at 7 o'clock; no suspicious left axillary lymphadenopathy; tow groups of indeterminate calcifications involving upper-inner and upper-outer right breast, each group spanning up to 6 cm.  Accordingly on 12/11/2020 she proceeded to biopsy of the bilateral breast areas in question. The pathology from this procedure ([id number]) showed:  1. Left Breast  - invasive ductal carcinoma, grade 2.   - Prognostic indicators significant for: estrogen receptor, 95% positive and progesterone receptor, 60% positive, both with strong staining intensity. Proliferation marker Ki67 at 15%. HER2 equivocal by immunohistochemistry (2+), but negative by fluorescent in situ hybridization with a signals ratio 1.34 and number per cell 1.95. 2. Right Breast  - fat necrosis and dystrophic calcifications   Cancer Staging  Malignant neoplasm of lower-inner quadrant of left breast in female, estrogen receptor positive (HCC) Staging form: Breast, AJCC 8th Edition - Clinical stage from 12/18/2020: Stage IA (cT1b, cN0, cM0, G2, ER+, PR+, HER2-) - Signed by Willy Harvest, MD on 12/18/2020 Stage prefix: Initial  diagnosis Histologic grading system: 3 grade system  The patient's subsequent history is as detailed below.   PAST MEDICAL HISTORY: 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   Constipation    Decreased hearing    Diabetes (HCC)    Diverticulitis    Dry eyes    Fatigue    Floaters in visual field    Gall bladder disease    GERD (gastroesophageal reflux disease)    Glaucoma    Hiatal hernia with gastroesophageal reflux    History of blood transfusion    for Knee replacement   Hyperlipidemia, mild 04/02/2016   Hypertension    Hypothyroidism    Joint pain    Migraine aura without headache    Muscle pain    Muscle stiffness    Nasal congestion    Obesity 04/02/2016   Osteoarthritis    Parotiditis 04/21/2016   Pre-diabetes    Vitamin D  deficiency 04/02/2016    PAST SURGICAL HISTORY: Past Surgical History:  Procedure Laterality Date   APPENDECTOMY     BREAST LUMPECTOMY WITH RADIOACTIVE SEED LOCALIZATION Left 01/21/2021   Procedure: LEFT BREAST LUMPECTOMY WITH RADIOACTIVE SEED LOCALIZATION;  Surgeon: Oza Blumenthal, MD;  Location: [facility];  Service: General;  Laterality: Left;   cataract surgery  2008   CHOLECYSTECTOMY  2010   JOINT REPLACEMENT Left    2012   MASTOIDECTOMY     POSTERIOR CERVICAL FUSION/FORAMINOTOMY N/A 04/27/2017   Procedure: CERVICAL FIVE-SIX  OPEN REDUCTION OF FRACTURE, CERVICAL THREE-SEVEN  DORSAL FIXATION AND FUSION;  Surgeon: Ditty, Raelene Bullocks, MD;  Location: [facility] OR;  Service: Neurosurgery;  Laterality: N/A;   SHOULDER ARTHROSCOPY Bilateral prior to 2010   TONSILLECTOMY     TOTAL HIP ARTHROPLASTY Right 03/22/2015   Procedure: RIGHT TOTAL HIP ARTHROPLASTY ANTERIOR APPROACH;  Surgeon: Neil Balls, MD;  Location: [facility] OR;  Service: Orthopedics;  Laterality: Right;   TOTAL KNEE ARTHROPLASTY Bilateral 2007   TUBAL LIGATION       FAMILY HISTORY: Family History  Problem Relation Age of Onset   Dementia Mother    Hypertension Mother    Obesity Mother    Diabetes Mother    Heart disease Father    Hypertension Father    Stroke Father    Diabetes Father    Hyperlipidemia Father    Alcoholism Father    Obesity Father    Cancer Maternal Grandfather        colon cancer   Arthritis Daughter    Her father died at age 57 from MI. Her mother died at age 58 from dementia. [name] has one brother (and no sisters). She reports colon cancer in her maternal grandfather. There is no family history of breast or ovarian cancer to her knowledge.   GYNECOLOGIC HISTORY:  No LMP recorded. Patient is postmenopausal. Menarche: 89 years old Age at first live birth: 89 years old GX P 4 LMP date unsure, "61-90?" Contraceptive: used from [id number] HRT never used  Hysterectomy? no BSO? 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 She is widowed. She lives independently by herself at [facility]. Daughter Bettina Bruch, age 57, works with computers in [place], [place]. Daughter Dionne Frederick, age 20, is retired but Regulatory affairs officer and lives in [place], [place]. Daughter Francene Purdum, age 33, works in Audiological scientist estate in [place], [place].  Daughter Shela Sayarath, age 48, works with computers in [place], [place]. Gurney Lefort and Parma graduated from [employer]. Kyira has 5 grandchildren, two of whom are MD's.    ADVANCED DIRECTIVES: in place   HEALTH MAINTENANCE: Social History   Tobacco Use   Smoking status: Never   Smokeless tobacco: Never   Tobacco comments:    Stopped 50 years ago.   Vaping Use   Vaping status: Never Used  Substance Use Topics   Alcohol use: Yes    Alcohol/week: 0.0 standard drinks of alcohol    Comment: socially 3-4x/wk   Drug use: No     Colonoscopy: 2014 (Dr. Marian Ship), repeat not indicated (age)  PAP: date unknown  Bone  density: 07/2019, -1.7   No Known Allergies   Current Outpatient Medications  Medication Sig Dispense Refill   anastrozole  (ARIMIDEX ) 1 MG tablet TAKE 1 TABLET BY MOUTH DAILY 100 tablet 2   Biotin 10000 MCG TABS Take 1 tablet by mouth daily.     Blood Glucose Monitoring Suppl DEVI 1 each by Does not apply route in the morning, at noon, and at bedtime. May substitute to any manufacturer covered by patient's insurance. 1 each 0   brimonidine  (ALPHAGAN ) 0.2 % ophthalmic solution Place 1 drop into both eyes 2 (two) times daily. 5 mL 12   Calcium  Carb-Cholecalciferol 600-10 MG-MCG TABS Take 1 tablet by mouth daily.     famotidine  (PEPCID ) 40 MG tablet TAKE 1 TABLET BY MOUTH AT  BEDTIME AS NEEDED FOR HEARTBURN  OR INDIGESTION (Patient taking differently: Take 40 mg by mouth at bedtime.) 100 tablet 2   fluticasone  (FLONASE ) 50 MCG/ACT nasal spray Place 2 sprays into both nostrils daily. 16 g 6   ketoconazole (NIZORAL) 2 % shampoo Apply topically.     latanoprost (XALATAN) 0.005 % ophthalmic solution Place 1 drop into both eyes at bedtime.     Levothyroxine  Sodium 137 MCG CAPS Take 1 capsule (137 mcg total) by mouth daily before breakfast. 90 capsule 0   losartan  (COZAAR ) 50 MG tablet TAKE 1 TABLET BY MOUTH DAILY 100 tablet 2   MAGNESIUM -POTASSIUM PO Take 1 tablet by mouth daily.     Multiple Vitamins-Minerals (PRESERVISION AREDS PO) Take 1 tablet by mouth daily.     MYRBETRIQ  50 MG TB24 tablet Take 50 mg by mouth daily. (Patient not taking: Reported on 01/11/2024)     omeprazole  (PRILOSEC) 40 MG capsule TAKE 1 CAPSULE BY MOUTH DAILY 100 capsule 2   rosuvastatin  (CRESTOR ) 5 MG tablet Take 1 tablet (5 mg total) by mouth daily. 90 tablet 1   Vitamin D , Cholecalciferol, 25 MCG (1000 UT) CAPS Take 1 capsule by mouth daily.     No current facility-administered medications for this visit.    OBJECTIVE: White woman who appears stated age  Vitals:   01/18/24 1324  BP: (!) 142/67  Pulse: 84  Resp: 17   Temp: (!) 97.3 F (36.3 C)  SpO2: 94%       Body mass index is 32.75 kg/m.   Wt Readings from Last 3 Encounters:  01/18/24 196 lb 12.8 oz (89.3 kg)  12/27/23 192 lb (87.1 kg)  11/01/23 190 lb (86.2 kg)      ECOG FS:1 - Symptomatic but completely ambulatory  Physical Exam Constitutional:      Appearance: Normal appearance.  Chest:     Comments: Bilateral breasts inspected.  No palpable masses or regional adenopathy  Musculoskeletal:     Cervical back: Normal range of motion and  neck supple. No rigidity.  Lymphadenopathy:     Cervical: No cervical adenopathy.  Neurological:     Mental Status: She is alert.     LAB RESULTS:  CMP     Component Value Date/Time   NA 136 12/27/2023 1042   K 5.0 12/27/2023 1042   CL 97 12/27/2023 1042   CO2 23 12/27/2023 1042   GLUCOSE 120 (H) 12/27/2023 1042   GLUCOSE 96 10/14/2023 1414   BUN 13 12/27/2023 1042   CREATININE 0.70 12/27/2023 1042   CREATININE 0.76 01/02/2022 1331   CALCIUM  9.3 12/27/2023 1042   PROT 6.5 12/27/2023 1042   ALBUMIN 4.4 12/27/2023 1042   AST 29 12/27/2023 1042   AST 20 01/02/2022 1331   ALT 20 12/27/2023 1042   ALT 19 01/02/2022 1331   ALKPHOS 116 12/27/2023 1042   BILITOT 0.3 12/27/2023 1042   BILITOT 0.3 01/02/2022 1331   GFRNONAA >60 03/02/2022 1307   GFRNONAA >60 01/02/2022 1331   GFRAA 94 09/28/2018 1147    No results found for: "TOTALPROTELP", "ALBUMINELP", "A1GS", "A2GS", "BETS", "BETA2SER", "GAMS", "MSPIKE", "SPEI"  Lab Results  Component Value Date   WBC 4.3 12/27/2023   NEUTROABS 2.6 12/27/2023   HGB 13.1 12/27/2023   HCT 39.6 12/27/2023   MCV 99 (H) 12/27/2023   PLT 200 12/27/2023    No results found for: "LABCA2"  No components found for: "OVFIEP329"  No results for input(s): "INR" in the last 168 hours.  No results found for: "LABCA2"  No results found for: "JJO841"  No results found for: "CAN125"  No results found for: "CAN153"  No results found for:  "CA2729"  No components found for: "HGQUANT"  No results found for: "CEA1", "CEA" / No results found for: "CEA1", "CEA"   No results found for: "AFPTUMOR"  No results found for: "CHROMOGRNA"  No results found for: "KPAFRELGTCHN", "LAMBDASER", "KAPLAMBRATIO" (kappa/lambda light chains)  No results found for: "HGBA", "HGBA2QUANT", "HGBFQUANT", "HGBSQUAN" (Hemoglobinopathy evaluation)   No results found for: "LDH"  No results found for: "IRON", "TIBC", "IRONPCTSAT" (Iron and TIBC)  No results found for: "FERRITIN"  Urinalysis    Component Value Date/Time   COLORURINE YELLOW 01/06/2021 1454   APPEARANCEUR Sl Cloudy (A) 01/06/2021 1454   LABSPEC 1.015 01/06/2021 1454   PHURINE 6.0 01/06/2021 1454   GLUCOSEU NEGATIVE 01/06/2021 1454   HGBUR NEGATIVE 01/06/2021 1454   BILIRUBINUR Negative 11/03/2022 1010   KETONESUR NEGATIVE 01/06/2021 1454   PROTEINUR Negative 11/03/2022 1010   PROTEINUR NEGATIVE 05/14/2017 0757   UROBILINOGEN 1.0 11/03/2022 1010   UROBILINOGEN 0.2 01/06/2021 1454   NITRITE Negative 11/03/2022 1010   NITRITE POSITIVE (A) 01/06/2021 1454   LEUKOCYTESUR Small (1+) (A) 11/03/2022 1010   LEUKOCYTESUR SMALL (A) 01/06/2021 1454    STUDIES: No results found.   ELIGIBLE FOR AVAILABLE RESEARCH PROTOCOL: no  ASSESSMENT: 89 y.o. [place] woman status post left breast lower inner quadrant biopsy 12/11/2020 for a clinical T1b N0, stage IA invasive ductal carcinoma, grade 2, estrogen and progesterone receptor positive, HER-2 not amplified, with an MIB-1 of 15%  (1) status post left lumpectomy 01/21/2021 for a pT1c pNX, stage IA invasive ductal carcinoma, grade 2, with negative margins  (2) adjuvant radiation 03/05/2021 through 04/02/2021 Site Technique Total Dose (Gy) Dose per Fx (Gy) Completed Fx Beam Energies  Breast, Left: Breast_Lt 3D 42.56/42.56 2.66 16/16 10X  Breast, Left: Breast_Lt_Bst specialPort 8/8 2 4/4 9E, 12E   (3) to start anastrozole   04/28/2021  (A) bone density 07/17/2019  at the [facility] shows a T score of -1.7   PLAN: Assessment & Plan Breast cancer, post-treatment Post-treatment changes in the left breast on mammogram with no evidence of recurrence. Currently on anastrozole  since August 2022, with no new side effects. Treatment planned for five years, completing in fall 2027. Anastrozole  may worsen bone density, but benefits outweigh risks given current status. - Continue anastrozole  therapy. - Order mammogram for March 2026 at [facility].  Osteoporosis Osteoporosis with slight worsening on recent bone density scan, particularly in the arm. Not on specific osteoporosis medication due to potential side effects and personal preference. - Continue vitamin D  supplementation. - Encourage dietary calcium  intake. - Recommend weight-bearing exercises with small weights. - She is not interested in taking bisphosphonate.  Aortic aneurysm Aortic aneurysm with no changes. Regular monitoring by cardiologist.  Anthon Kins macular degeneration Wet macular degeneration in the right eye. Receiving injections, but reports no improvement in vision. Condition has not worsened.  Urinary incontinence Urinary incontinence with previous treatment using Myrbetriq , which was ineffective. Considering alternative treatments, including nerve stimulation injections, but hesitant due to frequency and duration. Exploring other medication options is also being considered.  Total encounter time 30 minutes.*   *Total Encounter Time as defined by the Centers for Medicare and Medicaid Services includes, in addition to the face-to-face time of a patient visit (documented in the note above) non-face-to-face time: obtaining and reviewing outside history, ordering and reviewing medications, tests or procedures, care coordination (communications with other health care professionals or caregivers) and documentation in the medical record.

## 2024-01-24 ENCOUNTER — Ambulatory Visit: Admitting: Nurse Practitioner

## 2024-01-24 ENCOUNTER — Ambulatory Visit (INDEPENDENT_AMBULATORY_CARE_PROVIDER_SITE_OTHER): Admitting: Nurse Practitioner

## 2024-01-24 ENCOUNTER — Telehealth: Payer: Self-pay | Admitting: *Deleted

## 2024-01-24 ENCOUNTER — Encounter: Payer: Self-pay | Admitting: Nurse Practitioner

## 2024-01-24 ENCOUNTER — Telehealth: Payer: Self-pay

## 2024-01-24 VITALS — BP 144/81 | HR 67 | Temp 98.0°F | Ht 65.0 in | Wt 194.0 lb

## 2024-01-24 DIAGNOSIS — E039 Hypothyroidism, unspecified: Secondary | ICD-10-CM | POA: Diagnosis not present

## 2024-01-24 DIAGNOSIS — E6609 Other obesity due to excess calories: Secondary | ICD-10-CM

## 2024-01-24 DIAGNOSIS — R7989 Other specified abnormal findings of blood chemistry: Secondary | ICD-10-CM

## 2024-01-24 DIAGNOSIS — R269 Unspecified abnormalities of gait and mobility: Secondary | ICD-10-CM

## 2024-01-24 DIAGNOSIS — Z6832 Body mass index (BMI) 32.0-32.9, adult: Secondary | ICD-10-CM

## 2024-01-24 DIAGNOSIS — E1169 Type 2 diabetes mellitus with other specified complication: Secondary | ICD-10-CM | POA: Diagnosis not present

## 2024-01-24 DIAGNOSIS — E66811 Obesity, class 1: Secondary | ICD-10-CM

## 2024-01-24 NOTE — Telephone Encounter (Signed)
 Referral and demographics faxed to [facility] at [phone].

## 2024-01-24 NOTE — Telephone Encounter (Signed)
 For Dr. Rodrick Clapper upon return  Spoke with Trevor Fudge at [facility] and she stated that patient was seen today and she has some concerns.  Patient sees Dr. Phares Brasher at [facility] in [place] and would like to see someone closer to where she lives (at [facility] [place]).  She does not like the Urogyn that she sees, she has seen her twice, she feels that she is being treated differently because of her age.  Would like to see someone else.  Trevor Fudge also stated that pt has balance and gait issue and she did put in a referral to PT at [facility], they will come to her.  She also stated that patient does not seem to be usual self today and would like for us  to see her sooner than 03/20/24.   Advised that we will try and get her in sooner.

## 2024-01-24 NOTE — Progress Notes (Signed)
 Office: [phone]  /  Fax: [phone]  WEIGHT SUMMARY AND BIOMETRICS  Weight Lost Since Last Visit: 0lb  Weight Gained Since Last Visit: 2lb   Vitals Temp: 98 F (36.7 C) BP: (!) 144/81 Pulse Rate: 67 SpO2: 96 %   Anthropometric Measurements Height: 5\' 5"  (1.651 m) Weight: 194 lb (88 kg) BMI (Calculated): 32.28 Weight at Last Visit: 192lb Weight Lost Since Last Visit: 0lb Weight Gained Since Last Visit: 2lb Starting Weight: 211lb Total Weight Loss (lbs): 17 lb (7.711 kg)   Body Composition  Body Fat %: 46.9 % Fat Mass (lbs): 91 lbs Muscle Mass (lbs): 97.8 lbs Total Body Water (lbs): 76.8 lbs Visceral Fat Rating : 16   Other Clinical Data Fasting: No Labs: No Today's Visit #: 16 Starting Date: 10/26/22     HPI  Chief Complaint: OBESITY  Catherine Coleman is here to discuss her progress with her obesity treatment plan. She is on the the Category 2 Plan and states she is following her eating plan approximately 0 % of the time. She states she is exercising 60 minutes 3 days per week.   Interval History:  Since last office visit she has gained 2 pounds. She is walking in the pool 3 days per week. She is struggling with some back pain.  She is seeing ortho and is looking to find someone closer to home.  She saw Dr. Isreal Marinas on 01/11/24 for incontinence and is looking for someone closer to home.  She notes she is having some balance and gait problems that has gradually been getting worse over the past couple of years.  She is requesting to be referred to PT at [facility], where she lives. She is drinking wine and water.   She is going to her daughter's for her 90th birthday  Her highest weight was 224 lbs.    Pharmacotherapy for weight loss: She is not currently taking medications  for medical weight loss.  Pharmacotherapy for DMT2:   Last A1c was 5.9 She is not checking BS at home.   Episodes of hypoglycemia: no On ACE and statin.  Last eye exam:   2024 She has tried Ozempic  and Metformin  in the past.  She stopped Ozempic  due to side effects.  She retried taking Metformin  since her last visit and stopped due to side effects of GI upset.          Lab Results  Component Value Date   HGBA1C 5.9 (H) 12/27/2023   HGBA1C 6.1 10/14/2023   HGBA1C 5.9 06/01/2023   Lab Results  Component Value Date   MICROALBUR 1.0 10/14/2023   LDLCALC 82 10/14/2023   CREATININE 0.70 12/27/2023     Hypothyroidism Reports that she is overall feeling better.  Her fatigue has improved.   Medication(s): Levothyroxine  137 mcg daily. Increased by Dr. Rodrick Clapper on 4/165/25.   Lab Results  Component Value Date   TSH 23.300 (H) 12/27/2023   Low Vit B12 She is taking Vit B12 OTC.  Denies side effects.  Fatigue has improved.   PHYSICAL EXAM:  Blood pressure (!) 144/81, pulse 67, temperature 98 F (36.7 C), height 5\' 5"  (1.651 m), weight 194 lb (88 kg), SpO2 96%. Body mass index is 32.28 kg/m.  General: She is overweight, cooperative, alert, well developed, and in no acute distress. PSYCH: Has normal mood, affect and thought process.   Extremities: No edema.  Neurologic: No gross sensory or motor deficits. No tremors or fasciculations noted.    DIAGNOSTIC DATA REVIEWED:  BMET    Component Value Date/Time   NA 136 12/27/2023 1042   K 5.0 12/27/2023 1042   CL 97 12/27/2023 1042   CO2 23 12/27/2023 1042   GLUCOSE 120 (H) 12/27/2023 1042   GLUCOSE 96 10/14/2023 1414   BUN 13 12/27/2023 1042   CREATININE 0.70 12/27/2023 1042   CREATININE 0.76 01/02/2022 1331   CALCIUM  9.3 12/27/2023 1042   GFRNONAA >60 03/02/2022 1307   GFRNONAA >60 01/02/2022 1331   GFRAA 94 09/28/2018 1147   Lab Results  Component Value Date   HGBA1C 5.9 (H) 12/27/2023   HGBA1C 6.4 10/18/2017   Lab Results  Component Value Date   INSULIN  7.9 10/26/2022   INSULIN  15.5 05/30/2018   Lab Results  Component Value Date   TSH 23.300 (H) 12/27/2023   CBC    Component Value  Date/Time   WBC 4.3 12/27/2023 1042   WBC 5.1 10/14/2023 1414   RBC 4.01 12/27/2023 1042   RBC 4.27 10/14/2023 1414   HGB 13.1 12/27/2023 1042   HCT 39.6 12/27/2023 1042   PLT 200 12/27/2023 1042   MCV 99 (H) 12/27/2023 1042   MCH 32.7 12/27/2023 1042   MCH 31.9 08/12/2023 0829   MCHC 33.1 12/27/2023 1042   MCHC 34.0 10/14/2023 1414   RDW 13.1 12/27/2023 1042   Iron Studies No results found for: "IRON", "TIBC", "FERRITIN", "IRONPCTSAT" Lipid Panel     Component Value Date/Time   CHOL 173 10/14/2023 1414   CHOL 226 (H) 09/28/2018 1147   TRIG 100.0 10/14/2023 1414   HDL 71.30 10/14/2023 1414   HDL 60 09/28/2018 1147   CHOLHDL 2 10/14/2023 1414   VLDL 20.0 10/14/2023 1414   LDLCALC 82 10/14/2023 1414   LDLCALC 134 (H) 09/28/2018 1147   LDLDIRECT 138.0 05/15/2019 1022   Hepatic Function Panel     Component Value Date/Time   PROT 6.5 12/27/2023 1042   ALBUMIN 4.4 12/27/2023 1042   AST 29 12/27/2023 1042   AST 20 01/02/2022 1331   ALT 20 12/27/2023 1042   ALT 19 01/02/2022 1331   ALKPHOS 116 12/27/2023 1042   BILITOT 0.3 12/27/2023 1042   BILITOT 0.3 01/02/2022 1331      Component Value Date/Time   TSH 23.300 (H) 12/27/2023 1042   Nutritional Lab Results  Component Value Date   VD25OH 34.71 10/14/2023   VD25OH 62.50 01/15/2023   VD25OH 41.92 10/12/2022     ASSESSMENT AND PLAN  TREATMENT PLAN FOR OBESITY:  Recommended Dietary Goals  Catherine Coleman is worried about gaining weight.  To work on portion sizes and continue to make healthier choices.    Behavioral Intervention  We discussed the following Behavioral Modification Strategies today: increasing lean protein intake to established goals, decreasing simple carbohydrates , increasing vegetables, increasing fiber rich foods, increasing water intake , and continue to work on maintaining a reduced calorie state, getting the recommended amount of protein, incorporating whole foods, making healthy choices, staying  well hydrated and practicing mindfulness when eating..  Additional resources provided today: NA  Recommended Physical Activity Goals  Catherine Coleman has been advised to work up to 150 minutes of moderate intensity aerobic activity a week and strengthening exercises 2-3 times per week for cardiovascular health, weight loss maintenance and preservation of muscle mass.   She has agreed to Think about enjoyable ways to increase daily physical activity and overcoming barriers to exercise, Increase physical activity in their day and reduce sedentary time (increase NEAT)., and continue to gradually increase the  amount and intensity of exercise routine   ASSOCIATED CONDITIONS ADDRESSED TODAY  Action/Plan  Type 2 diabetes mellitus with obesity (HCC) Last A1c looked good.  I don't recommend restarting Metformin  or Ozempic .  Patient has had side effects to both.    Will continue to monitor  Hypothyroidism, unspecified type Feeling better.  Continue to follow up with PCP.  Take meds as directed  Low vitamin B12 level Overall feeling better, fatigue has improved.  Continue vitamin D  OTC.  Will continue to monitor.  Gait disorder -     Ambulatory referral to Physical Therapy  Class 1 obesity due to excess calories with body mass index (BMI) of 32.0 to 32.9 in adult, unspecified whether serious comorbidity present  I contacted her PCP's office about some of her concerns today.  Plans to see her sooner.    Will recheck labs at next visit per patient's request-TSH, Vit B12, CMP-would like her to follow up with her PCP.    Return in about 4 weeks (around 02/21/2024).Aaron Aas She was informed of the importance of frequent follow up visits to maximize her success with intensive lifestyle modifications for her multiple health conditions.   ATTESTASTION STATEMENTS:  Reviewed by clinician on day of visit: allergies, medications, problem list, medical history, surgical history, family history, social history, and  previous encounter notes.     Crist Dominion. Chestine Belknap FNP-C

## 2024-01-27 NOTE — Telephone Encounter (Signed)
 Left message on machine to call back

## 2024-01-28 NOTE — Telephone Encounter (Signed)
 Patient was advised and scheduled appointment on 01/31/24.

## 2024-01-30 NOTE — Assessment & Plan Note (Signed)
 Supplement and monitor

## 2024-01-30 NOTE — Assessment & Plan Note (Addendum)
 Mildly elevated add carvedilol  3.125 mg bid and recheck in one month. Encouraged heart healthy diet such as the DASH diet and exercise as tolerated.

## 2024-01-30 NOTE — Assessment & Plan Note (Signed)
 Hydrate and monitor

## 2024-01-30 NOTE — Assessment & Plan Note (Signed)
 Encourage heart healthy diet such as MIND or DASH diet, increase exercise, avoid trans fats, simple carbohydrates and processed foods, consider a krill or fish or flaxseed oil cap daily.  hgba1c acceptable, minimize simple carbs. Increase exercise as tolerated. Continue current meds

## 2024-01-30 NOTE — Assessment & Plan Note (Signed)
 On Levothyroxine, continue to monitor

## 2024-01-30 NOTE — Assessment & Plan Note (Signed)
hgba1c acceptable, minimize simple carbs. Increase exercise as tolerated. Set up with glucometer, lancets and test strips to check sugars daily to weekly and prn

## 2024-01-31 ENCOUNTER — Encounter: Payer: Self-pay | Admitting: Family Medicine

## 2024-01-31 ENCOUNTER — Ambulatory Visit: Admitting: Family Medicine

## 2024-01-31 ENCOUNTER — Telehealth: Payer: Self-pay | Admitting: Obstetrics and Gynecology

## 2024-01-31 VITALS — BP 150/92 | HR 68 | Resp 16 | Ht 65.0 in | Wt 196.4 lb

## 2024-01-31 DIAGNOSIS — R32 Unspecified urinary incontinence: Secondary | ICD-10-CM

## 2024-01-31 DIAGNOSIS — E785 Hyperlipidemia, unspecified: Secondary | ICD-10-CM

## 2024-01-31 DIAGNOSIS — E1169 Type 2 diabetes mellitus with other specified complication: Secondary | ICD-10-CM | POA: Diagnosis not present

## 2024-01-31 DIAGNOSIS — I10 Essential (primary) hypertension: Secondary | ICD-10-CM | POA: Diagnosis not present

## 2024-01-31 DIAGNOSIS — E038 Other specified hypothyroidism: Secondary | ICD-10-CM | POA: Diagnosis not present

## 2024-01-31 DIAGNOSIS — N3281 Overactive bladder: Secondary | ICD-10-CM

## 2024-01-31 DIAGNOSIS — E559 Vitamin D deficiency, unspecified: Secondary | ICD-10-CM | POA: Diagnosis not present

## 2024-01-31 DIAGNOSIS — R252 Cramp and spasm: Secondary | ICD-10-CM

## 2024-01-31 DIAGNOSIS — E669 Obesity, unspecified: Secondary | ICD-10-CM

## 2024-01-31 DIAGNOSIS — E538 Deficiency of other specified B group vitamins: Secondary | ICD-10-CM | POA: Diagnosis not present

## 2024-01-31 MED ORDER — LEVOTHYROXINE SODIUM 137 MCG PO TABS: 137.0000 ug | ORAL_TABLET | Freq: Every day | ORAL | 1 refills | Status: DC

## 2024-01-31 MED ORDER — CARVEDILOL 3.125 MG PO TABS: 3.1250 mg | ORAL_TABLET | Freq: Two times a day (BID) | ORAL | 3 refills | Status: DC

## 2024-01-31 NOTE — Patient Instructions (Signed)
 Peripheral Edema  Peripheral edema is swelling that is caused by a buildup of fluid. Peripheral edema most often affects the lower legs, ankles, and feet. It can also develop in the arms, hands, and face. The area of the body that has peripheral edema will look swollen. It may also feel heavy or warm. Your clothes may start to feel tight. Pressing on the area may make a temporary dent in your skin (pitting edema). You may not be able to move your swollen arm or leg as much as usual. There are many causes of peripheral edema. It can happen because of a complication of other conditions such as heart failure, kidney disease, or a problem with your circulation. It also can be a side effect of certain medicines or happen because of an infection. It often happens to women during pregnancy. Sometimes, the cause is not known. Follow these instructions at home: Managing pain, stiffness, and swelling  Raise (elevate) your legs while you are sitting or lying down. Move around often to prevent stiffness and to reduce swelling. Do not sit or stand for long periods of time. Do not wear tight clothing. Do not wear garters on your upper legs. Exercise your legs to get your circulation going. This helps to move the fluid back into your blood vessels, and it may help the swelling go down. Wear compression stockings as told by your health care provider. These stockings help to prevent blood clots and reduce swelling in your legs. It is important that these are the correct size. These stockings should be prescribed by your doctor to prevent possible injuries. If elastic bandages or wraps are recommended, use them as told by your health care provider. Medicines Take over-the-counter and prescription medicines only as told by your health care provider. Your health care provider may prescribe medicine to help your body get rid of excess water (diuretic). Take this medicine if you are told to take it. General  instructions Eat a low-salt (low-sodium) diet as told by your health care provider. Sometimes, eating less salt may reduce swelling. Pay attention to any changes in your symptoms. Moisturize your skin daily to help prevent skin from cracking and draining. Keep all follow-up visits. This is important. Contact a health care provider if: You have a fever. You have swelling in only one leg. You have increased swelling, redness, or pain in one or both of your legs. You have drainage or sores at the area where you have edema. Get help right away if: You have edema that starts suddenly or is getting worse, especially if you are pregnant or have a medical condition. You develop shortness of breath, especially when you are lying down. You have pain in your chest or abdomen. You feel weak. You feel like you will faint. These symptoms may be an emergency. Get help right away. Call 911. Do not wait to see if the symptoms will go away. Do not drive yourself to the hospital. Summary Peripheral edema is swelling that is caused by a buildup of fluid. Peripheral edema most often affects the lower legs, ankles, and feet. Move around often to prevent stiffness and to reduce swelling. Do not sit or stand for long periods of time. Pay attention to any changes in your symptoms. Contact a health care provider if you have edema that starts suddenly or is getting worse, especially if you are pregnant or have a medical condition. Get help right away if you develop shortness of breath, especially when lying down.  This information is not intended to replace advice given to you by your health care provider. Make sure you discuss any questions you have with your health care provider. Document Revised: 05/05/2021 Document Reviewed: 05/05/2021 Elsevier Patient Education  2024 [employer].

## 2024-01-31 NOTE — Telephone Encounter (Signed)
 Called pt today to give her benefits for [employer]NS.  She would have a copay every time she comes in.  She asked if there was a surgery that could be done? She would like to do that if there is an option.  Also, she can do a virtual visit if need be.  Told her you were out today and we would get the msg to you and get back with her.

## 2024-01-31 NOTE — Progress Notes (Signed)
 Subjective:    Patient ID: Catherine Coleman, female    DOB: [birth year], 89 y.o.   MRN: [id number]  Chief Complaint  Patient presents with   Acute Visit    Patient presents today for lower back pain. She reports the pain is getting worse.    HPI Discussed the use of AI scribe software for clinical note transcription with the patient, who gave verbal consent to proceed.  History of Present Illness Ludene Stokke is an 89 year old female with hypothyroidism and overactive bladder who presents for medication management and evaluation of multiple symptoms.  She has been experiencing issues with her levothyroxine  medication, noting a switch from capsules to tablets. She feels significantly better with the current dose of 137 mcg, as she had been feeling 'wiped out' all winter before the adjustment. Her daughter has experienced similar thyroid  issues, improving with dose adjustments.  She discusses her overactive bladder, previously managed with Myrbetriq . She experiences symptoms of urgency and incontinence, particularly when standing up or waking at night, and has previously found pelvic physical therapy helpful.  She reports lower back pain, which has shifted from her right hip to her lumbar region. She has received injections from Dr. Lyanne Sample that provided relief for her hip pain, lasting up to ten months. She is considering similar treatment for her back pain, which is associated with balance issues and gait changes, including walking on her right instep.  She notes swelling in her ankles, which she manages with a compression brace. She has a history of varicose vein surgery and is concerned about venous insufficiency.  Her blood pressure has been running in the 140s, and she is currently on losartan . No headaches or chest pain associated with her blood pressure readings.    Past Medical History:  Diagnosis Date   Arthritis    Atrophic rhinitis 04/21/2016   Back pain    Breast cancer  (HCC) 12/2020   left breast IDC   Constipation    Decreased hearing    Diabetes (HCC)    Diverticulitis    Dry eyes    Fatigue    Floaters in visual field    Gall bladder disease    GERD (gastroesophageal reflux disease)    Glaucoma    Hiatal hernia with gastroesophageal reflux    History of blood transfusion    for Knee replacement   Hyperlipidemia, mild 04/02/2016   Hypertension    Hypothyroidism    Joint pain    Migraine aura without headache    Muscle pain    Muscle stiffness    Nasal congestion    Obesity 04/02/2016   Osteoarthritis    Parotiditis 04/21/2016   Pre-diabetes    Vitamin D  deficiency 04/02/2016    Past Surgical History:  Procedure Laterality Date   APPENDECTOMY     BREAST LUMPECTOMY WITH RADIOACTIVE SEED LOCALIZATION Left 01/21/2021   Procedure: LEFT BREAST LUMPECTOMY WITH RADIOACTIVE SEED LOCALIZATION;  Surgeon: Oza Blumenthal, MD;  Location: [facility];  Service: General;  Laterality: Left;   cataract surgery  2008   CHOLECYSTECTOMY  2010   JOINT REPLACEMENT Left    2012   MASTOIDECTOMY     POSTERIOR CERVICAL FUSION/FORAMINOTOMY N/A 04/27/2017   Procedure: CERVICAL FIVE-SIX  OPEN REDUCTION OF FRACTURE, CERVICAL THREE-SEVEN  DORSAL FIXATION AND FUSION;  Surgeon: Ditty, Raelene Bullocks, MD;  Location: [facility] OR;  Service: Neurosurgery;  Laterality: N/A;   SHOULDER ARTHROSCOPY Bilateral prior to 2010   TONSILLECTOMY     TOTAL HIP  ARTHROPLASTY Right 03/22/2015   Procedure: RIGHT TOTAL HIP ARTHROPLASTY ANTERIOR APPROACH;  Surgeon: Neil Balls, MD;  Location: [facility] OR;  Service: Orthopedics;  Laterality: Right;   TOTAL KNEE ARTHROPLASTY Bilateral 2007   TUBAL LIGATION      Family History  Problem Relation Age of Onset   Dementia Mother    Hypertension Mother    Obesity Mother    Diabetes Mother    Heart disease Father    Hypertension Father    Stroke Father    Diabetes Father    Hyperlipidemia Father    Alcoholism Father    Obesity  Father    Cancer Maternal Grandfather        colon cancer   Arthritis Daughter     Social History   Socioeconomic History   Marital status: Widowed    Spouse name: Not on file   Number of children: Not on file   Years of education: Not on file   Highest education level: Not on file  Occupational History   Occupation: Retired  Tobacco Use   Smoking status: Never   Smokeless tobacco: Never   Tobacco comments:    Stopped 50 years ago.   Vaping Use   Vaping status: Never Used  Substance and Sexual Activity   Alcohol use: Yes    Alcohol/week: 0.0 standard drinks of alcohol    Comment: socially 3-4x/wk   Drug use: No   Sexual activity: Not Currently  Other Topics Concern   Not on file  Social History Narrative   Lives at [facility], widowed 7 years, no dietary restrictions. Retired from neuropsychiatry work.    Social Drivers of Corporate investment banker Strain: Low Risk  (10/21/2022)   Overall Financial Resource Strain (CARDIA)    Difficulty of Paying Living Expenses: Not hard at all  Food Insecurity: No Food Insecurity (10/21/2022)   Hunger Vital Sign    Worried About Running Out of Food in the Last Year: Never true    Ran Out of Food in the Last Year: Never true  Transportation Needs: No Transportation Needs (10/21/2022)   PRAPARE - Administrator, Civil Service (Medical): No    Lack of Transportation (Non-Medical): No  Physical Activity: Sufficiently Active (10/21/2022)   Exercise Vital Sign    Days of Exercise per Week: 3 days    Minutes of Exercise per Session: 60 min  Stress: No Stress Concern Present (10/21/2022)   [employer] of Occupational Health - Occupational Stress Questionnaire    Feeling of Stress : Not at all  Social Connections: Moderately Isolated (10/21/2022)   Social Connection and Isolation Panel [NHANES]    Frequency of Communication with Friends and Family: More than three times a week    Frequency of Social Gatherings with Friends  and Family: More than three times a week    Attends Religious Services: Never    Database administrator or Organizations: Yes    Attends Banker Meetings: 1 to 4 times per year    Marital Status: Widowed  Intimate Partner Violence: Not At Risk (10/21/2022)   Humiliation, Afraid, Rape, and Kick questionnaire    Fear of Current or Ex-Partner: No    Emotionally Abused: No    Physically Abused: No    Sexually Abused: No    Outpatient Medications Prior to Visit  Medication Sig Dispense Refill   anastrozole  (ARIMIDEX ) 1 MG tablet TAKE 1 TABLET BY MOUTH DAILY 100 tablet 2  Biotin 10000 MCG TABS Take 1 tablet by mouth daily.     Blood Glucose Monitoring Suppl DEVI 1 each by Does not apply route in the morning, at noon, and at bedtime. May substitute to any manufacturer covered by patient's insurance. 1 each 0   brimonidine  (ALPHAGAN ) 0.2 % ophthalmic solution Place 1 drop into both eyes 2 (two) times daily. 5 mL 12   Calcium  Carb-Cholecalciferol 600-10 MG-MCG TABS Take 1 tablet by mouth daily.     famotidine  (PEPCID ) 40 MG tablet TAKE 1 TABLET BY MOUTH AT  BEDTIME AS NEEDED FOR HEARTBURN  OR INDIGESTION (Patient taking differently: Take 40 mg by mouth at bedtime.) 100 tablet 2   fluticasone  (FLONASE ) 50 MCG/ACT nasal spray Place 2 sprays into both nostrils daily. 16 g 6   ketoconazole (NIZORAL) 2 % shampoo Apply topically.     latanoprost (XALATAN) 0.005 % ophthalmic solution Place 1 drop into both eyes at bedtime.     losartan  (COZAAR ) 50 MG tablet TAKE 1 TABLET BY MOUTH DAILY 100 tablet 2   MAGNESIUM -POTASSIUM PO Take 1 tablet by mouth daily.     Multiple Vitamins-Minerals (PRESERVISION AREDS PO) Take 1 tablet by mouth daily.     MYRBETRIQ  50 MG TB24 tablet Take 50 mg by mouth daily.     omeprazole  (PRILOSEC) 40 MG capsule TAKE 1 CAPSULE BY MOUTH DAILY 100 capsule 2   rosuvastatin  (CRESTOR ) 5 MG tablet Take 1 tablet (5 mg total) by mouth daily. 90 tablet 1   Vitamin D ,  Cholecalciferol, 25 MCG (1000 UT) CAPS Take 1 capsule by mouth daily.     Levothyroxine  Sodium 137 MCG CAPS Take 1 capsule (137 mcg total) by mouth daily before breakfast. 90 capsule 0   No facility-administered medications prior to visit.    No Known Allergies  Review of Systems  Constitutional:  Negative for fever and malaise/fatigue.  HENT:  Negative for congestion.   Eyes:  Negative for blurred vision.  Respiratory:  Negative for shortness of breath.   Cardiovascular:  Positive for leg swelling. Negative for chest pain and palpitations.  Gastrointestinal:  Negative for abdominal pain, blood in stool and nausea.  Genitourinary:  Positive for frequency and urgency. Negative for dysuria.  Musculoskeletal:  Positive for back pain and joint pain. Negative for falls.  Skin:  Negative for rash.  Neurological:  Negative for dizziness, loss of consciousness and headaches.  Endo/Heme/Allergies:  Negative for environmental allergies.  Psychiatric/Behavioral:  Negative for depression. The patient is not nervous/anxious.        Objective:     Physical Exam Constitutional:      General: She is not in acute distress.    Appearance: Normal appearance. She is well-developed. She is not toxic-appearing.  HENT:     Head: Normocephalic and atraumatic.     Right Ear: External ear normal.     Left Ear: External ear normal.     Nose: Nose normal.  Eyes:     General:        Right eye: No discharge.        Left eye: No discharge.     Conjunctiva/sclera: Conjunctivae normal.  Neck:     Thyroid : No thyromegaly.  Cardiovascular:     Rate and Rhythm: Normal rate and regular rhythm.     Heart sounds: Normal heart sounds. No murmur heard. Pulmonary:     Effort: Pulmonary effort is normal. No respiratory distress.     Breath sounds: Normal breath sounds.  Abdominal:  General: Bowel sounds are normal.     Palpations: Abdomen is soft.     Tenderness: There is no abdominal tenderness. There  is no guarding.  Musculoskeletal:        General: Normal range of motion.     Cervical back: Normal range of motion and neck supple.  Lymphadenopathy:     Cervical: No cervical adenopathy.  Skin:    General: Skin is warm and dry.  Neurological:     Mental Status: She is alert and oriented to person, place, and time.  Psychiatric:        Mood and Affect: Mood normal.        Behavior: Behavior normal.        Thought Content: Thought content normal.        Judgment: Judgment normal.     BP (!) 150/92   Pulse 68   Resp 16   Ht 5\' 5"  (1.651 m)   Wt 196 lb 6.4 oz (89.1 kg)   SpO2 95%   BMI 32.68 kg/m  Wt Readings from Last 3 Encounters:  01/31/24 196 lb 6.4 oz (89.1 kg)  01/24/24 194 lb (88 kg)  01/18/24 196 lb 12.8 oz (89.3 kg)    Diabetic Foot Exam - Simple   No data filed    Lab Results  Component Value Date   WBC 4.3 12/27/2023   HGB 13.1 12/27/2023   HCT 39.6 12/27/2023   PLT 200 12/27/2023   GLUCOSE 120 (H) 12/27/2023   CHOL 173 10/14/2023   TRIG 100.0 10/14/2023   HDL 71.30 10/14/2023   LDLDIRECT 138.0 05/15/2019   LDLCALC 82 10/14/2023   ALT 20 12/27/2023   AST 29 12/27/2023   NA 136 12/27/2023   K 5.0 12/27/2023   CL 97 12/27/2023   CREATININE 0.70 12/27/2023   BUN 13 12/27/2023   CO2 23 12/27/2023   TSH 23.300 (H) 12/27/2023   INR 1.05 04/28/2017   HGBA1C 5.9 (H) 12/27/2023   MICROALBUR 1.0 10/14/2023    Lab Results  Component Value Date   TSH 23.300 (H) 12/27/2023   Lab Results  Component Value Date   WBC 4.3 12/27/2023   HGB 13.1 12/27/2023   HCT 39.6 12/27/2023   MCV 99 (H) 12/27/2023   PLT 200 12/27/2023   Lab Results  Component Value Date   NA 136 12/27/2023   K 5.0 12/27/2023   CO2 23 12/27/2023   GLUCOSE 120 (H) 12/27/2023   BUN 13 12/27/2023   CREATININE 0.70 12/27/2023   BILITOT 0.3 12/27/2023   ALKPHOS 116 12/27/2023   AST 29 12/27/2023   ALT 20 12/27/2023   PROT 6.5 12/27/2023   ALBUMIN 4.4 12/27/2023   CALCIUM  9.3  12/27/2023   ANIONGAP 11 03/02/2022   EGFR 83 12/27/2023   GFR 71.61 10/14/2023   Lab Results  Component Value Date   CHOL 173 10/14/2023   Lab Results  Component Value Date   HDL 71.30 10/14/2023   Lab Results  Component Value Date   LDLCALC 82 10/14/2023   Lab Results  Component Value Date   TRIG 100.0 10/14/2023   Lab Results  Component Value Date   CHOLHDL 2 10/14/2023   Lab Results  Component Value Date   HGBA1C 5.9 (H) 12/27/2023       Assessment & Plan:  Vitamin D  deficiency Assessment & Plan: Supplement and monitor    Vitamin B 12 deficiency Assessment & Plan: Supplement and monitor    Type 2 diabetes  mellitus with obesity (HCC) Assessment & Plan: hgba1c acceptable, minimize simple carbs. Increase exercise as tolerated. Set up with glucometer, lancets and test strips to check sugars daily to weekly and prn   Primary hypertension Assessment & Plan: Mildly elevated add carvedilol  3.125 mg bid and recheck in one month. Encouraged heart healthy diet such as the DASH diet and exercise as tolerated.     Other specified hypothyroidism Assessment & Plan: On Levothyroxine , continue to monitor   Muscle cramps Assessment & Plan: Hydrate and monitor    Hyperlipidemia associated with type 2 diabetes mellitus (HCC) Assessment & Plan: Encourage heart healthy diet such as MIND or DASH diet, increase exercise, avoid trans fats, simple carbohydrates and processed foods, consider a krill or fish or flaxseed oil cap daily.  hgba1c acceptable, minimize simple carbs. Increase exercise as tolerated. Continue current meds    Urinary incontinence, unspecified type -     Ambulatory referral to Physical Therapy  Other orders -     Levothyroxine  Sodium; Take 1 tablet (137 mcg total) by mouth daily before breakfast.  Dispense: 90 tablet; Refill: 1 -     Carvedilol ; Take 1 tablet (3.125 mg total) by mouth 2 (two) times daily with a meal.  Dispense: 60 tablet; Refill:  3    Assessment and Plan Assessment & Plan Overactive bladder Experiencing urgency and incontinence. Tibial stimulation discussed but concerns about logistics and cost. Previous physical therapy beneficial. Surgical options high risk for her age. Encouraged to consult Dr. Frutoso Jing if considering surgery. - Refer to physical therapy for pelvic PT at [facility] location. - Consult Dr. Frutoso Jing regarding surgical options if symptoms worsen and she is willing to consider surgery.  Low back pain Experiencing low back pain with lumbar spasms affecting balance and gait. Previous hip injections effective. X-rays show arthritis, surgery not advised. Considering injections for low back pain if specialists approve. - Refer to physical therapy for back pain and balance issues at [facility] location. - Consider injections for low back pain if specialists approve.  Hypertension Blood pressure in the 140s/80s. Discussed benefits of maintaining blood pressure in the 130s. Current medication losartan , well-tolerated. Adding carvedilol  to achieve target blood pressure. - Add carvedilol  3.125 mg twice daily to current regimen. - Send prescription to [facility]. - Schedule nurse visit in one month for blood pressure and pulse check. - Follow up in 3-4 months.  Hypothyroidism Improvement in symptoms after adjusting levothyroxine  dosage. Current dosage 137 mcg, switched from capsules to tablets. - Continue levothyroxine  137 mcg daily. - Monitor thyroid  function tests as scheduled next month.  Venous insufficiency Experiencing ankle swelling, likely due to venous insufficiency. Underwent varicose vein surgery. Swelling mild and age-related. Advised on lifestyle modifications. - Elevate feet above heart for 15 minutes, 2-3 times a day. - Wear compression socks during prolonged standing or walking. - Monitor sodium intake.      Randie Bustle, MD

## 2024-02-02 NOTE — Telephone Encounter (Signed)
 Called patient but no answer. Left VM that a I will send her a my chart message with options.

## 2024-02-03 NOTE — Addendum Note (Signed)
 Addended by: Arma Lamp on: 02/03/2024 08:47 AM   Modules accepted: Orders

## 2024-02-07 ENCOUNTER — Other Ambulatory Visit: Payer: Self-pay | Admitting: Family Medicine

## 2024-02-07 ENCOUNTER — Other Ambulatory Visit: Payer: Self-pay | Admitting: Hematology and Oncology

## 2024-02-08 ENCOUNTER — Other Ambulatory Visit: Payer: Self-pay | Admitting: Obstetrics and Gynecology

## 2024-02-08 DIAGNOSIS — M6281 Muscle weakness (generalized): Secondary | ICD-10-CM | POA: Diagnosis not present

## 2024-02-08 DIAGNOSIS — N3281 Overactive bladder: Secondary | ICD-10-CM

## 2024-02-08 DIAGNOSIS — M62838 Other muscle spasm: Secondary | ICD-10-CM | POA: Diagnosis not present

## 2024-02-08 DIAGNOSIS — R350 Frequency of micturition: Secondary | ICD-10-CM

## 2024-02-09 DIAGNOSIS — M47816 Spondylosis without myelopathy or radiculopathy, lumbar region: Secondary | ICD-10-CM | POA: Diagnosis not present

## 2024-02-17 DIAGNOSIS — H353211 Exudative age-related macular degeneration, right eye, with active choroidal neovascularization: Secondary | ICD-10-CM | POA: Diagnosis not present

## 2024-02-28 ENCOUNTER — Ambulatory Visit: Admitting: Nurse Practitioner

## 2024-02-28 DIAGNOSIS — R2681 Unsteadiness on feet: Secondary | ICD-10-CM | POA: Diagnosis not present

## 2024-03-02 ENCOUNTER — Ambulatory Visit

## 2024-03-02 ENCOUNTER — Encounter: Payer: Self-pay | Admitting: Student

## 2024-03-02 ENCOUNTER — Ambulatory Visit: Admitting: Student

## 2024-03-02 ENCOUNTER — Encounter: Payer: Self-pay | Admitting: *Deleted

## 2024-03-02 ENCOUNTER — Other Ambulatory Visit (HOSPITAL_BASED_OUTPATIENT_CLINIC_OR_DEPARTMENT_OTHER): Payer: Self-pay

## 2024-03-02 VITALS — BP 106/70 | HR 58 | Temp 97.7°F | Resp 12 | Ht 65.0 in | Wt 196.6 lb

## 2024-03-02 DIAGNOSIS — R0981 Nasal congestion: Secondary | ICD-10-CM | POA: Insufficient documentation

## 2024-03-02 DIAGNOSIS — J0100 Acute maxillary sinusitis, unspecified: Secondary | ICD-10-CM | POA: Diagnosis not present

## 2024-03-02 DIAGNOSIS — R059 Cough, unspecified: Secondary | ICD-10-CM | POA: Insufficient documentation

## 2024-03-02 LAB — POCT INFLUENZA A/B
Influenza A, POC: NEGATIVE
Influenza B, POC: NEGATIVE

## 2024-03-02 LAB — POC COVID19 BINAXNOW: SARS Coronavirus 2 Ag: NEGATIVE

## 2024-03-02 MED ORDER — AMOXICILLIN-POT CLAVULANATE 875-125 MG PO TABS
1.0000 | ORAL_TABLET | Freq: Two times a day (BID) | ORAL | 0 refills | Status: AC
  Filled 2024-03-02: qty 20, 10d supply, fill #0

## 2024-03-02 NOTE — Progress Notes (Unsigned)
 Error

## 2024-03-02 NOTE — Progress Notes (Signed)
 Subjective:     Patient ID: Catherine Coleman, female    DOB: [birth year], 89 y.o.   MRN: [id number]  Chief Complaint  Patient presents with   Cough   Nasal Congestion   Hoarse   Blood Pressure Check    HPI    Catherine Coleman is a 89 yo female presents for acute visit -cough, nasal congestion, sinus pain,. She has been feeling under the weather for past 3 weeks.  She has been traveling and flying and reports feeling sick after coming back home after flying/ traveling out of state   Meds tried: Day Quill, Nyquill, decongestants Sick contacts: denies  Cough: yes Productive cough: no Fever: report fever when symptoms began Headache/face pain: yes  Double sickening: yes Tooth pain: no  Sneezing: yes  Itchy scratchy throat: yes  Seasonal sx: yes  Headache: no  Muscle aches: no  Severe fatigue: yes  Stiff neck: no  Dyspnea: no  Swallowing difficulty: no   Patient reports that she is using Flonase  daily, but has not been taking her Claritin allergy medication daily  Patient denies chills, SOB, CP, palpitations, dyspnea, edema, HA, vision changes, N/V/D, abdominal pain, urinary symptoms, rash, weight changes, and recent illness or hospitalizations.      History of Present Illness              Health Maintenance Due  Topic Date Due   OPHTHALMOLOGY EXAM  Never done   COVID-19 Vaccine (9 - 2024-25 season) 07/31/2023   Medicare Annual Wellness (AWV)  10/22/2023   FOOT EXAM  02/18/2024    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   Constipation    Decreased hearing    Diabetes (HCC)    Diverticulitis    Dry eyes    Fatigue    Floaters in visual field    Gall bladder disease    GERD (gastroesophageal reflux disease)    Glaucoma    Hiatal hernia with gastroesophageal reflux    History of blood transfusion    for Knee replacement   Hyperlipidemia, mild  04/02/2016   Hypertension    Hypothyroidism    Joint pain    Migraine aura without headache    Muscle pain    Muscle stiffness    Nasal congestion    Obesity 04/02/2016   Osteoarthritis    Parotiditis 04/21/2016   Pre-diabetes    Vitamin D  deficiency 04/02/2016    Past Surgical History:  Procedure Laterality Date   APPENDECTOMY     BREAST LUMPECTOMY WITH RADIOACTIVE SEED LOCALIZATION Left 01/21/2021   Procedure: LEFT BREAST LUMPECTOMY WITH RADIOACTIVE SEED LOCALIZATION;  Surgeon: Oza Blumenthal, MD;  Location: [facility];  Service: General;  Laterality: Left;   cataract surgery  2008   CHOLECYSTECTOMY  2010   JOINT REPLACEMENT Left    2012   MASTOIDECTOMY     POSTERIOR CERVICAL FUSION/FORAMINOTOMY N/A 04/27/2017   Procedure: CERVICAL FIVE-SIX  OPEN REDUCTION OF FRACTURE, CERVICAL THREE-SEVEN  DORSAL FIXATION AND FUSION;  Surgeon: Ditty, Raelene Bullocks, MD;  Location: [facility] OR;  Service: Neurosurgery;  Laterality: N/A;   SHOULDER ARTHROSCOPY Bilateral prior to 2010   TONSILLECTOMY     TOTAL HIP ARTHROPLASTY Right 03/22/2015   Procedure: RIGHT TOTAL HIP ARTHROPLASTY ANTERIOR APPROACH;  Surgeon: Neil Balls, MD;  Location: [facility] OR;  Service: Orthopedics;  Laterality: Right;  TOTAL KNEE ARTHROPLASTY Bilateral 2007   TUBAL LIGATION      Family History  Problem Relation Age of Onset   Dementia Mother    Hypertension Mother    Obesity Mother    Diabetes Mother    Heart disease Father    Hypertension Father    Stroke Father    Diabetes Father    Hyperlipidemia Father    Alcoholism Father    Obesity Father    Cancer Maternal Grandfather        colon cancer   Arthritis Daughter     Social History   Socioeconomic History   Marital status: Widowed    Spouse name: Not on file   Number of children: Not on file   Years of education: Not on file   Highest education level: Not on file  Occupational History   Occupation: Retired  Tobacco Use   Smoking status:  Never   Smokeless tobacco: Never   Tobacco comments:    Stopped 50 years ago.   Vaping Use   Vaping status: Never Used  Substance and Sexual Activity   Alcohol use: Yes    Alcohol/week: 0.0 standard drinks of alcohol    Comment: socially 3-4x/wk   Drug use: No   Sexual activity: Not Currently  Other Topics Concern   Not on file  Social History Narrative   L[facility] at [facility], widowed 7 years, no dietary restrictions. Retired from neuropsychiatry work.    Social Drivers of Corporate investment banker Strain: Low Risk  (10/21/2022)   Overall Financial Resource Strain (CARDIA)    Difficulty of Paying Living Expenses: Not hard at all  Food Insecurity: No Food Insecurity (10/21/2022)   Hunger Vital Sign    Worried About Running Out of Food in the Last Year: Never true    Ran Out of Food in the Last Year: Never true  Transportation Needs: No Transportation Needs (10/21/2022)   PRAPARE - Administrator, Civil Service (Medical): No    Lack of Transportation (Non-Medical): No  Physical Activity: Sufficiently Active (10/21/2022)   Exercise Vital Sign    Days of Exercise per Week: 3 days    Minutes of Exercise per Session: 60 min  Stress: No Stress Concern Present (10/21/2022)   [employer] of Occupational Health - Occupational Stress Questionnaire    Feeling of Stress : Not at all  Social Connections: Moderately Isolated (10/21/2022)   Social Connection and Isolation Panel    Frequency of Communication with Friends and Family: More than three times a week    Frequency of Social Gatherings with Friends and Family: More than three times a week    Attends Religious Services: Never    Database administrator or Organizations: Yes    Attends Banker Meetings: 1 to 4 times per year    Marital Status: Widowed  Intimate Partner Violence: Not At Risk (10/21/2022)   Humiliation, Afraid, Rape, and Kick questionnaire    Fear of Current or Ex-Partner: No    Emotionally  Abused: No    Physically Abused: No    Sexually Abused: No    Outpatient Medications Prior to Visit  Medication Sig Dispense Refill   anastrozole  (ARIMIDEX ) 1 MG tablet TAKE 1 TABLET BY MOUTH DAILY 100 tablet 2   brimonidine  (ALPHAGAN ) 0.2 % ophthalmic solution Place 1 drop into both eyes 2 (two) times daily. 5 mL 12   Calcium  Carb-Cholecalciferol 600-10 MG-MCG TABS Take 1 tablet by  mouth daily.     carvedilol  (COREG ) 3.125 MG tablet Take 1 tablet (3.125 mg total) by mouth 2 (two) times daily with a meal. 60 tablet 3   famotidine  (PEPCID ) 40 MG tablet TAKE 1 TABLET BY MOUTH AT  BEDTIME AS NEEDED FOR HEARTBURN  OR INDIGESTION (Patient taking differently: Take 40 mg by mouth at bedtime.) 100 tablet 2   fluticasone  (FLONASE ) 50 MCG/ACT nasal spray Place 2 sprays into both nostrils daily. 16 g 6   latanoprost (XALATAN) 0.005 % ophthalmic solution Place 1 drop into both eyes at bedtime.     levothyroxine  (SYNTHROID ) 137 MCG tablet Take 1 tablet (137 mcg total) by mouth daily before breakfast. 90 tablet 1   losartan  (COZAAR ) 50 MG tablet TAKE 1 TABLET BY MOUTH DAILY 100 tablet 2   MAGNESIUM -POTASSIUM PO Take 1 tablet by mouth daily.     Multiple Vitamins-Minerals (PRESERVISION AREDS PO) Take 1 tablet by mouth daily.     MYRBETRIQ  50 MG TB24 tablet Take 50 mg by mouth daily.     omeprazole  (PRILOSEC) 40 MG capsule TAKE 1 CAPSULE BY MOUTH DAILY 100 capsule 2   rosuvastatin  (CRESTOR ) 5 MG tablet TAKE 1 TABLET BY MOUTH DAILY 100 tablet 2   Vitamin D , Cholecalciferol, 25 MCG (1000 UT) CAPS Take 1 capsule by mouth daily.     Biotin 16109 MCG TABS Take 1 tablet by mouth daily.     Blood Glucose Monitoring Suppl DEVI 1 each by Does not apply route in the morning, at noon, and at bedtime. May substitute to any manufacturer covered by patient's insurance. 1 each 0   ketoconazole (NIZORAL) 2 % shampoo Apply topically.     No facility-administered medications prior to visit.    No Known  Allergies  ROS See HPI    Objective:    Physical Exam  HEENT: - Head: Normocephalic, atraumatic. - Eyes: Conjunctivae clear, no scleral icterus. - Ears: Tympanic membranes clear bilaterally, no bulging or erythema. - Nose: Nasal mucosa mildly erythematous, congested - Throat: Mucosa moist. No tonsillar exudate or swelling. Uvula midline.  -Respiratory: Lungs clear to auscultation bilaterally. No wheezing, rales, or rhonchi. No respiratory distress. Normal work of breathing. -Cardiovascular: Regular rate and rhythm. No murmurs, rubs, or gallops. -Skin: Warm, dry. No rash. -Neurological: Alert and oriented x3. No focal deficits.   BP 106/70 (BP Location: Right Arm, Patient Position: Sitting, Cuff Size: Normal)   Pulse (!) 58   Temp 97.7 F (36.5 C) (Oral)   Resp 12   Ht 5' 5 (1.651 m)   Wt 196 lb 9.6 oz (89.2 kg)   SpO2 94%   BMI 32.72 kg/m  Wt Readings from Last 3 Encounters:  03/02/24 196 lb 9.6 oz (89.2 kg)  01/31/24 196 lb 6.4 oz (89.1 kg)  01/24/24 194 lb (88 kg)       Assessment & Plan:   Problem List Items Addressed This Visit     Acute maxillary sinusitis - Primary   Flu negative, COVID negative.  Rx-Augmentin  875-125 mg twice daily for 10 days. Symptomatic treatment with OTC medications as appropriate:.  Saline nasal spray, decongestants, throat lozenges for nasal congestion/sore throat. Advised close monitoring for red flag symptoms: persistent fever >101F beyond 3 days, SOB, or worsening symptoms beyond 10 days- RTC.  Provided return precautions and RTC if symptoms worsen or fail to improve.       Relevant Medications   amoxicillin -clavulanate (AUGMENTIN ) 875-125 MG tablet   Cough   Relevant Orders  POCT Influenza A/B (Completed)   POC COVID-19 BinaxNow (Completed)   Nasal congestion   Continue daily Flonase .  Encouraged patient to take her Claritin allergy medication daily      Relevant Orders   POCT Influenza A/B (Completed)   POC COVID-19  BinaxNow (Completed)    Patient was educated on the diagnosis, treatment options, potential risks, benefits, and alternatives. All questions were addressed. Patient verbalized understanding and agrees with the plan of care. Will follow up as advised or sooner if symptoms worsen or new concerns arise.  Portions of this note were dictated using DRAGON voice recognition software. Please disregard any errors in transcription.    I have discontinued Elandra Cheatum's Biotin, Blood Glucose Monitoring Suppl, and ketoconazole. I am also having her start on amoxicillin -clavulanate. Additionally, I am having her maintain her brimonidine , Vitamin D  (Cholecalciferol), MAGNESIUM -POTASSIUM PO, Calcium  Carb-Cholecalciferol, Multiple Vitamins-Minerals (PRESERVISION AREDS PO), famotidine , latanoprost, omeprazole , losartan , fluticasone , Myrbetriq , levothyroxine , carvedilol , rosuvastatin , and anastrozole .  Meds ordered this encounter  Medications   amoxicillin -clavulanate (AUGMENTIN ) 875-125 MG tablet    Sig: Take 1 tablet by mouth 2 (two) times daily for 10 days.    Dispense:  20 tablet    Refill:  0    Supervising Provider:   Randie Bustle A [[id number]]

## 2024-03-02 NOTE — Assessment & Plan Note (Signed)
 Flu negative, COVID negative.  Rx-Augmentin  875-125 mg twice daily for 10 days. Symptomatic treatment with OTC medications as appropriate:.  Saline nasal spray, decongestants, throat lozenges for nasal congestion/sore throat. Advised close monitoring for red flag symptoms: persistent fever >101F beyond 3 days, SOB, or worsening symptoms beyond 10 days- RTC.  Provided return precautions and RTC if symptoms worsen or fail to improve.

## 2024-03-02 NOTE — Assessment & Plan Note (Signed)
 Continue daily Flonase .  Encouraged patient to take her Claritin allergy medication daily

## 2024-03-07 DIAGNOSIS — R2681 Unsteadiness on feet: Secondary | ICD-10-CM | POA: Diagnosis not present

## 2024-03-09 ENCOUNTER — Encounter: Payer: Self-pay | Admitting: Family Medicine

## 2024-03-13 ENCOUNTER — Encounter: Payer: Self-pay | Admitting: Nurse Practitioner

## 2024-03-13 ENCOUNTER — Ambulatory Visit (INDEPENDENT_AMBULATORY_CARE_PROVIDER_SITE_OTHER): Admitting: Nurse Practitioner

## 2024-03-13 VITALS — BP 136/83 | HR 64 | Temp 97.9°F | Ht 65.0 in | Wt 191.0 lb

## 2024-03-13 DIAGNOSIS — E66811 Obesity, class 1: Secondary | ICD-10-CM

## 2024-03-13 DIAGNOSIS — E039 Hypothyroidism, unspecified: Secondary | ICD-10-CM

## 2024-03-13 DIAGNOSIS — Z7985 Long-term (current) use of injectable non-insulin antidiabetic drugs: Secondary | ICD-10-CM | POA: Diagnosis not present

## 2024-03-13 DIAGNOSIS — R269 Unspecified abnormalities of gait and mobility: Secondary | ICD-10-CM | POA: Diagnosis not present

## 2024-03-13 DIAGNOSIS — E1169 Type 2 diabetes mellitus with other specified complication: Secondary | ICD-10-CM

## 2024-03-13 DIAGNOSIS — Z6831 Body mass index (BMI) 31.0-31.9, adult: Secondary | ICD-10-CM

## 2024-03-13 DIAGNOSIS — I10 Essential (primary) hypertension: Secondary | ICD-10-CM | POA: Diagnosis not present

## 2024-03-13 DIAGNOSIS — R2681 Unsteadiness on feet: Secondary | ICD-10-CM | POA: Diagnosis not present

## 2024-03-13 NOTE — Progress Notes (Signed)
 Office: [phone]  /  Fax: [phone]  WEIGHT SUMMARY AND BIOMETRICS  Weight Lost Since Last Visit: 3lb  Weight Gained Since Last Visit: 0lb   Vitals Temp: 97.9 F (36.6 C) BP: 136/83 Pulse Rate: 64 SpO2: 95 %   Anthropometric Measurements Height: 5' 5 (1.651 m) Weight: 191 lb (86.6 kg) BMI (Calculated): 31.78 Weight at Last Visit: 194lb Weight Lost Since Last Visit: 3lb Weight Gained Since Last Visit: 0lb Starting Weight: 211lb Total Weight Loss (lbs): 20 lb (9.072 kg)   Body Composition  Body Fat %: 46.3 % Fat Mass (lbs): 88.4 lbs Muscle Mass (lbs): 97.4 lbs Total Body Water (lbs): 73.2 lbs Visceral Fat Rating : 16   Other Clinical Data Fasting: No Labs: No Today's Visit #: 17 Starting Date: 10/26/22     HPI  Chief Complaint: OBESITY  Catherine Coleman is here to discuss her progress with her obesity treatment plan. She is on the the Category 2 Plan and states she is following her eating plan approximately 50 % of the time. She states she is exercising 60 minutes 3 days per week.   Interval History:  Since last office visit she has lost 3 pounds.  She went to [place]  since her last visit and celebrated her 89th birthday.  She is starting bladder PT in 2 weeks and plans to go two times per week. She started PT for gait disturbance and plans to go 2 days per week.   She is drinking water but not enough due to incontinence.  She is doing pool exercises 3 days per week.  She is drinking water and has limited her wine intake.     Her highest weight was 224 lbs.    Pharmacotherapy for weight loss: She is not currently taking medications  for medical weight loss.  Pharmacotherapy for DMT2:   Last A1c was 5.9 She is not checking her BS at home.   Episodes of hypoglycemia: denies On ACE and statin.  Last eye exam:  2024 -She has tried Ozempic  and Metformin  in the past.  -She stopped Ozempic  due to side effects.  -She retried taking Metformin  and stopped  due to side effects of GI upset.     Lab Results  Component Value Date   HGBA1C 5.9 (H) 12/27/2023   HGBA1C 6.1 10/14/2023   HGBA1C 5.9 06/01/2023   Lab Results  Component Value Date   LDLCALC 82 10/14/2023   CREATININE 0.70 12/27/2023    Gait disturbance Has been to PT once and plans to continue to go to PT two days per week.   Hypertension Saw PCP last on 01/31/24.  Was started on carvedilol  3.125mg  BID.  She is also taking losartan  50mg  daily.  Denies side effects.   Has an appt with PCP on 04/13/24 Denies chest pain, palpitations and SOB.  BP Readings from Last 3 Encounters:  03/13/24 136/83  03/02/24 106/70  01/31/24 (!) 150/92   Lab Results  Component Value Date   CREATININE 0.70 12/27/2023   CREATININE 0.74 10/14/2023   CREATININE 0.80 08/12/2023     Hypothyroidism Stable.  Does not report symptoms associated with uncontrolled hypothyroidism. Medication(s): Levothyroxine  137 mcg daily. She is having a repeat TSH at her PCP's office next week.    Lab Results  Component Value Date   TSH 23.300 (H) 12/27/2023    PHYSICAL EXAM:  Blood pressure 136/83, pulse 64, temperature 97.9 F (36.6 C), height 5' 5 (1.651 m), weight 191 lb (86.6 kg), SpO2 95%. Body  mass index is 31.78 kg/m.  General: She is overweight, cooperative, alert, well developed, and in no acute distress. PSYCH: Has normal mood, affect and thought process.   Extremities: No edema.  Neurologic: No gross sensory or motor deficits. No tremors or fasciculations noted.    DIAGNOSTIC DATA REVIEWED:  BMET    Component Value Date/Time   NA 136 12/27/2023 1042   K 5.0 12/27/2023 1042   CL 97 12/27/2023 1042   CO2 23 12/27/2023 1042   GLUCOSE 120 (H) 12/27/2023 1042   GLUCOSE 96 10/14/2023 1414   BUN 13 12/27/2023 1042   CREATININE 0.70 12/27/2023 1042   CREATININE 0.76 01/02/2022 1331   CALCIUM  9.3 12/27/2023 1042   GFRNONAA >60 03/02/2022 1307   GFRNONAA >60 01/02/2022 1331   GFRAA 94  09/28/2018 1147   Lab Results  Component Value Date   HGBA1C 5.9 (H) 12/27/2023   HGBA1C 6.4 10/18/2017   Lab Results  Component Value Date   INSULIN  7.9 10/26/2022   INSULIN  15.5 05/30/2018   Lab Results  Component Value Date   TSH 23.300 (H) 12/27/2023   CBC    Component Value Date/Time   WBC 4.3 12/27/2023 1042   WBC 5.1 10/14/2023 1414   RBC 4.01 12/27/2023 1042   RBC 4.27 10/14/2023 1414   HGB 13.1 12/27/2023 1042   HCT 39.6 12/27/2023 1042   PLT 200 12/27/2023 1042   MCV 99 (H) 12/27/2023 1042   MCH 32.7 12/27/2023 1042   MCH 31.9 08/12/2023 0829   MCHC 33.1 12/27/2023 1042   MCHC 34.0 10/14/2023 1414   RDW 13.1 12/27/2023 1042   Iron Studies No results found for: IRON, TIBC, FERRITIN, IRONPCTSAT Lipid Panel     Component Value Date/Time   CHOL 173 10/14/2023 1414   CHOL 226 (H) 09/28/2018 1147   TRIG 100.0 10/14/2023 1414   HDL 71.30 10/14/2023 1414   HDL 60 09/28/2018 1147   CHOLHDL 2 10/14/2023 1414   VLDL 20.0 10/14/2023 1414   LDLCALC 82 10/14/2023 1414   LDLCALC 134 (H) 09/28/2018 1147   LDLDIRECT 138.0 05/15/2019 1022   Hepatic Function Panel     Component Value Date/Time   PROT 6.5 12/27/2023 1042   ALBUMIN 4.4 12/27/2023 1042   AST 29 12/27/2023 1042   AST 20 01/02/2022 1331   ALT 20 12/27/2023 1042   ALT 19 01/02/2022 1331   ALKPHOS 116 12/27/2023 1042   BILITOT 0.3 12/27/2023 1042   BILITOT 0.3 01/02/2022 1331      Component Value Date/Time   TSH 23.300 (H) 12/27/2023 1042   Nutritional Lab Results  Component Value Date   VD25OH 34.71 10/14/2023   VD25OH 62.50 01/15/2023   VD25OH 41.92 10/12/2022     ASSESSMENT AND PLAN  TREATMENT PLAN FOR OBESITY:  Recommended Dietary Goals  Catherine Coleman is currently in the action stage of change. As such, her goal is to continue weight management plan. She has agreed to the Category 2 Plan.  Behavioral Intervention  We discussed the following Behavioral Modification Strategies  today: increasing lean protein intake to established goals, decreasing simple carbohydrates , increasing vegetables, increasing fiber rich foods, increasing water intake , work on meal planning and preparation, reading food labels , identifying sources and decreasing liquid calories, and continue to work on maintaining a reduced calorie state, getting the recommended amount of protein, incorporating whole foods, making healthy choices, staying well hydrated and practicing mindfulness when eating..  Additional resources provided today: NA  Recommended Physical Activity  Goals  Anisah has been advised to work up to 150 minutes of moderate intensity aerobic activity a week and strengthening exercises 2-3 times per week for cardiovascular health, weight loss maintenance and preservation of muscle mass.   She has agreed to Think about enjoyable ways to increase daily physical activity and overcoming barriers to exercise, Increase physical activity in their day and reduce sedentary time (increase NEAT)., and continue to gradually increase the amount and intensity of exercise routine  Continue PT appointments.    ASSOCIATED CONDITIONS ADDRESSED TODAY  Action/Plan  Type 2 diabetes mellitus with obesity (HCC) Doing well-diet controlled.  Will continue to monitor.  She is interested in restarting Ozempic .  I don't recommend restarting Metformin  or Ozempic . Patient had side effects to both.    Gait disorder Keep appts for PT  Primary hypertension Continue to follow up with PCP.  Continue meds as directed  Hypothyroidism, unspecified type Continue to follow up with PCP.  Continue meds as directed Keep appt for labs  Class 1 obesity due to excess calories with serious comorbidity and body mass index (BMI) of 31.0 to 31.9 in adult         Return in about 8 weeks (around 05/08/2024).SABRA She was informed of the importance of frequent follow up visits to maximize her success with intensive  lifestyle modifications for her multiple health conditions.   ATTESTASTION STATEMENTS:  Reviewed by clinician on day of visit: allergies, medications, problem list, medical history, surgical history, family history, social history, and previous encounter notes.   Time spent on visit including pre-visit chart review and post-visit care and charting was 30 minutes.    Catherine Coleman SAUNDERS. Jeyla Bulger FNP-C

## 2024-03-14 DIAGNOSIS — M6281 Muscle weakness (generalized): Secondary | ICD-10-CM | POA: Diagnosis not present

## 2024-03-14 DIAGNOSIS — M6289 Other specified disorders of muscle: Secondary | ICD-10-CM | POA: Diagnosis not present

## 2024-03-14 DIAGNOSIS — R350 Frequency of micturition: Secondary | ICD-10-CM | POA: Diagnosis not present

## 2024-03-15 DIAGNOSIS — R2681 Unsteadiness on feet: Secondary | ICD-10-CM | POA: Diagnosis not present

## 2024-03-16 DIAGNOSIS — H353211 Exudative age-related macular degeneration, right eye, with active choroidal neovascularization: Secondary | ICD-10-CM | POA: Diagnosis not present

## 2024-03-20 ENCOUNTER — Ambulatory Visit: Payer: Medicare Other | Admitting: Family Medicine

## 2024-03-21 DIAGNOSIS — R2681 Unsteadiness on feet: Secondary | ICD-10-CM | POA: Diagnosis not present

## 2024-03-22 DIAGNOSIS — M5459 Other low back pain: Secondary | ICD-10-CM | POA: Diagnosis not present

## 2024-03-23 DIAGNOSIS — R2681 Unsteadiness on feet: Secondary | ICD-10-CM | POA: Diagnosis not present

## 2024-03-27 ENCOUNTER — Telehealth: Payer: Self-pay | Admitting: Family Medicine

## 2024-03-27 DIAGNOSIS — E559 Vitamin D deficiency, unspecified: Secondary | ICD-10-CM

## 2024-03-27 DIAGNOSIS — R350 Frequency of micturition: Secondary | ICD-10-CM | POA: Diagnosis not present

## 2024-03-27 DIAGNOSIS — E039 Hypothyroidism, unspecified: Secondary | ICD-10-CM

## 2024-03-27 DIAGNOSIS — I10 Essential (primary) hypertension: Secondary | ICD-10-CM

## 2024-03-27 DIAGNOSIS — M6289 Other specified disorders of muscle: Secondary | ICD-10-CM | POA: Diagnosis not present

## 2024-03-27 DIAGNOSIS — E538 Deficiency of other specified B group vitamins: Secondary | ICD-10-CM

## 2024-03-27 DIAGNOSIS — E1169 Type 2 diabetes mellitus with other specified complication: Secondary | ICD-10-CM

## 2024-03-27 DIAGNOSIS — M6281 Muscle weakness (generalized): Secondary | ICD-10-CM | POA: Diagnosis not present

## 2024-03-27 NOTE — Telephone Encounter (Signed)
 Copied from CRM [id number]. Topic: General - Call Back - No Documentation >> Mar 27, 2024  8:07 AM Avram MATSU wrote: Reason for CRM: patient would like a callback regarding her appt, she has a few questions about labs. [phone] (H)

## 2024-03-28 DIAGNOSIS — R2681 Unsteadiness on feet: Secondary | ICD-10-CM | POA: Diagnosis not present

## 2024-03-28 NOTE — Telephone Encounter (Signed)
 Returned patients call and she would like to have her labs done before appointment on 04/13/24. Labs ordered and patient scheduled on 04/10/24. Just a FYI

## 2024-03-28 NOTE — Addendum Note (Signed)
 Addended by: Maicey Barrientez C on: 03/28/2024 08:44 AM   Modules accepted: Orders

## 2024-03-30 DIAGNOSIS — R2681 Unsteadiness on feet: Secondary | ICD-10-CM | POA: Diagnosis not present

## 2024-03-31 ENCOUNTER — Ambulatory Visit: Admitting: Obstetrics and Gynecology

## 2024-03-31 ENCOUNTER — Encounter: Payer: Self-pay | Admitting: Obstetrics and Gynecology

## 2024-03-31 VITALS — BP 126/70 | HR 56

## 2024-03-31 DIAGNOSIS — N952 Postmenopausal atrophic vaginitis: Secondary | ICD-10-CM

## 2024-03-31 DIAGNOSIS — N3281 Overactive bladder: Secondary | ICD-10-CM

## 2024-03-31 DIAGNOSIS — L819 Disorder of pigmentation, unspecified: Secondary | ICD-10-CM | POA: Diagnosis not present

## 2024-03-31 MED ORDER — ESTRADIOL 0.1 MG/GM VA CREA
0.5000 g | TOPICAL_CREAM | VAGINAL | 11 refills | Status: AC
Start: 2024-04-03 — End: ?

## 2024-03-31 NOTE — Progress Notes (Signed)
 [facility] Return Visit  SUBJECTIVE  History of Present Illness: Catherine Coleman is a 89 y.o. female seen in follow-up for OAB and concern for a spot on the vagina as reported by [facility] PT.     Past Medical History: Patient  has a past medical history of Arthritis, Atrophic rhinitis (04/21/2016), Back pain, Breast cancer (HCC) (12/2020), Constipation, Decreased hearing, Diabetes (HCC), Diverticulitis, Dry eyes, Fatigue, Floaters in visual field, Gall bladder disease, GERD (gastroesophageal reflux disease), Glaucoma, Hiatal hernia with gastroesophageal reflux, History of blood transfusion, Hyperlipidemia, mild (04/02/2016), Hypertension, Hypothyroidism, Joint pain, Migraine aura without headache, Muscle pain, Muscle stiffness, Nasal congestion, Obesity (04/02/2016), Osteoarthritis, Parotiditis (04/21/2016), Pre-diabetes, and Vitamin D  deficiency (04/02/2016).   Past Surgical History: She  has a past surgical history that includes Total knee arthroplasty (Bilateral, 2007); Shoulder arthroscopy (Bilateral, prior to 2010); Joint replacement (Left); Cholecystectomy (2010); Appendectomy; Tonsillectomy; Total hip arthroplasty (Right, 03/22/2015); cataract surgery (2008); Tubal ligation; Posterior cervical fusion/foraminotomy (N/A, 04/27/2017); Mastoidectomy; and Breast lumpectomy with radioactive seed localization (Left, 01/21/2021).   Medications: She has a current medication list which includes the following prescription(s): [START ON 04/03/2024] estradiol, anastrozole , brimonidine , calcium  carb-cholecalciferol, carvedilol , famotidine , fluticasone , latanoprost, levothyroxine , losartan , magnesium -potassium, multiple vitamins-minerals, myrbetriq , omeprazole , rosuvastatin , and vitamin d  (cholecalciferol).   Allergies: Patient has no known allergies.   Social History: Patient  reports that she has never smoked. She has never used smokeless tobacco. She reports current alcohol  use. She reports that she does not use drugs.     OBJECTIVE     Physical Exam: Vitals:   03/31/24 0759  BP: 126/70  Pulse: (!) 56   Gen: No apparent distress, A&O x 3.  Detailed Urogynecologic Evaluation:  Patient has significant atrophy in the internal vaginal area. She also has a darkened area with irregular borders that is approximately .5cm x .5cm.    ASSESSMENT AND PLAN    Catherine Coleman is a 89 y.o. with:  1. Pigmentation abnormality of skin   2. Overactive bladder   3. Vaginal atrophy    Patient has a skin abnormality on the left inner labia. We discussed biopsying the area but patient deferred today due to her age. She stated I am 89 years old and if it's cancer I probably won't do anything. Patient is continuing to do pelvic floor PT and has had some improvement in Nocturia. Will see how she is doing after a bit longer of PT.  Patient has vaginal atrophy on exam. She would benefit from estrogen cream. Patient to use a blueberry sized amount into the vagina. She may use this nightly for 2 weeks and then twice weekly after. We discussed using her finger instead of using the applicator.   Patient to return in 3 months for check of her bladder symptoms.    Catherine Coleman G Tien Spooner, NP

## 2024-03-31 NOTE — Patient Instructions (Signed)
 Use the estrogen cream nightly for two weeks. Use a blueberry sized amount on your finger into the vagina and then after the first two weeks use it twice a week for maintenance.   For the area on the vagina we can always biopsy it if you want to, otherwise we can just watch the area for changes.

## 2024-04-04 DIAGNOSIS — R2681 Unsteadiness on feet: Secondary | ICD-10-CM | POA: Diagnosis not present

## 2024-04-05 ENCOUNTER — Ambulatory Visit: Admitting: Physical Therapy

## 2024-04-06 DIAGNOSIS — M6281 Muscle weakness (generalized): Secondary | ICD-10-CM | POA: Diagnosis not present

## 2024-04-06 DIAGNOSIS — R2681 Unsteadiness on feet: Secondary | ICD-10-CM | POA: Diagnosis not present

## 2024-04-06 DIAGNOSIS — R350 Frequency of micturition: Secondary | ICD-10-CM | POA: Diagnosis not present

## 2024-04-10 ENCOUNTER — Other Ambulatory Visit (INDEPENDENT_AMBULATORY_CARE_PROVIDER_SITE_OTHER)

## 2024-04-10 DIAGNOSIS — E785 Hyperlipidemia, unspecified: Secondary | ICD-10-CM

## 2024-04-10 DIAGNOSIS — E039 Hypothyroidism, unspecified: Secondary | ICD-10-CM

## 2024-04-10 DIAGNOSIS — E1169 Type 2 diabetes mellitus with other specified complication: Secondary | ICD-10-CM

## 2024-04-10 DIAGNOSIS — I10 Essential (primary) hypertension: Secondary | ICD-10-CM | POA: Diagnosis not present

## 2024-04-10 DIAGNOSIS — E559 Vitamin D deficiency, unspecified: Secondary | ICD-10-CM | POA: Diagnosis not present

## 2024-04-10 DIAGNOSIS — E538 Deficiency of other specified B group vitamins: Secondary | ICD-10-CM

## 2024-04-10 DIAGNOSIS — E669 Obesity, unspecified: Secondary | ICD-10-CM

## 2024-04-10 LAB — LIPID PANEL
Cholesterol: 160 mg/dL (ref 0–200)
HDL: 62.4 mg/dL (ref >39.00)
LDL Cholesterol: 76 mg/dL (ref 0–99)
NonHDL: 97.74
Total CHOL/HDL Ratio: 3
Triglycerides: 110 mg/dL (ref 0.0–149.0)
VLDL: 22 mg/dL (ref 0.0–40.0)

## 2024-04-10 LAB — CBC WITH DIFFERENTIAL/PLATELET
Basophils Absolute: 0 K/uL (ref 0.0–0.1)
Basophils Relative: 0.8 % (ref 0.0–3.0)
Eosinophils Absolute: 0.1 K/uL (ref 0.0–0.7)
Eosinophils Relative: 1.9 % (ref 0.0–5.0)
HCT: 39.8 % (ref 36.0–46.0)
Hemoglobin: 13.3 g/dL (ref 12.0–15.0)
Lymphocytes Relative: 22.8 % (ref 12.0–46.0)
Lymphs Abs: 1 K/uL (ref 0.7–4.0)
MCHC: 33.4 g/dL (ref 30.0–36.0)
MCV: 93.9 fl (ref 78.0–100.0)
Monocytes Absolute: 0.5 K/uL (ref 0.1–1.0)
Monocytes Relative: 11 % (ref 3.0–12.0)
Neutro Abs: 2.9 K/uL (ref 1.4–7.7)
Neutrophils Relative %: 63.5 % (ref 43.0–77.0)
Platelets: 214 K/uL (ref 150.0–400.0)
RBC: 4.24 Mil/uL (ref 3.87–5.11)
RDW: 13.4 % (ref 11.5–15.5)
WBC: 4.5 K/uL (ref 4.0–10.5)

## 2024-04-10 LAB — COMPREHENSIVE METABOLIC PANEL WITH GFR
ALT: 13 U/L (ref 0–35)
AST: 18 U/L (ref 0–37)
Albumin: 4.4 g/dL (ref 3.5–5.2)
Alkaline Phosphatase: 100 U/L (ref 39–117)
BUN: 17 mg/dL (ref 6–23)
CO2: 29 meq/L (ref 19–32)
Calcium: 9.2 mg/dL (ref 8.4–10.5)
Chloride: 100 meq/L (ref 96–112)
Creatinine, Ser: 0.59 mg/dL (ref 0.40–1.20)
GFR: 79.49 mL/min (ref >60.00)
Glucose, Bld: 120 mg/dL — ABNORMAL HIGH (ref 70–99)
Potassium: 5.3 meq/L — ABNORMAL HIGH (ref 3.5–5.1)
Sodium: 137 meq/L (ref 135–145)
Total Bilirubin: 0.6 mg/dL (ref 0.2–1.2)
Total Protein: 6.6 g/dL (ref 6.0–8.3)

## 2024-04-10 LAB — HEMOGLOBIN A1C: Hgb A1c MFr Bld: 6.6 % — ABNORMAL HIGH (ref 4.6–6.5)

## 2024-04-11 ENCOUNTER — Ambulatory Visit: Payer: Self-pay | Admitting: Family Medicine

## 2024-04-11 DIAGNOSIS — R2681 Unsteadiness on feet: Secondary | ICD-10-CM | POA: Diagnosis not present

## 2024-04-11 LAB — VITAMIN B12: Vitamin B-12: 434 pg/mL (ref 200–1100)

## 2024-04-11 LAB — TSH: TSH: 0.29 u[IU]/mL — ABNORMAL LOW (ref 0.35–5.50)

## 2024-04-11 LAB — VITAMIN D 25 HYDROXY (VIT D DEFICIENCY, FRACTURES): VITD: 39.13 ng/mL (ref 30.00–100.00)

## 2024-04-12 NOTE — Assessment & Plan Note (Signed)
 On Levothyroxine, continue to monitor

## 2024-04-12 NOTE — Assessment & Plan Note (Signed)
 Encourage heart healthy diet such as MIND or DASH diet, increase exercise, avoid trans fats, simple carbohydrates and processed foods, consider a krill or fish or flaxseed oil cap daily.  hgba1c acceptable, minimize simple carbs. Increase exercise as tolerated. Continue current meds

## 2024-04-12 NOTE — Assessment & Plan Note (Signed)
 Supplement and monitor

## 2024-04-12 NOTE — Assessment & Plan Note (Signed)
Follows with opthamology ?

## 2024-04-12 NOTE — Assessment & Plan Note (Addendum)
 Well controlled, no changes to meds. Encouraged heart healthy diet such as the DASH diet and exercise as tolerated.

## 2024-04-12 NOTE — Assessment & Plan Note (Signed)
hgba1c acceptable, minimize simple carbs. Increase exercise as tolerated. Set up with glucometer, lancets and test strips to check sugars daily to weekly and prn

## 2024-04-12 NOTE — Assessment & Plan Note (Signed)
 Hydrate and monitor

## 2024-04-13 ENCOUNTER — Encounter: Payer: Self-pay | Admitting: Family Medicine

## 2024-04-13 ENCOUNTER — Ambulatory Visit (INDEPENDENT_AMBULATORY_CARE_PROVIDER_SITE_OTHER): Admitting: Family Medicine

## 2024-04-13 VITALS — BP 122/74 | HR 61 | Resp 16 | Ht 65.0 in | Wt 197.0 lb

## 2024-04-13 DIAGNOSIS — E538 Deficiency of other specified B group vitamins: Secondary | ICD-10-CM

## 2024-04-13 DIAGNOSIS — E1169 Type 2 diabetes mellitus with other specified complication: Secondary | ICD-10-CM | POA: Diagnosis not present

## 2024-04-13 DIAGNOSIS — E785 Hyperlipidemia, unspecified: Secondary | ICD-10-CM

## 2024-04-13 DIAGNOSIS — N3281 Overactive bladder: Secondary | ICD-10-CM

## 2024-04-13 DIAGNOSIS — E669 Obesity, unspecified: Secondary | ICD-10-CM

## 2024-04-13 DIAGNOSIS — E559 Vitamin D deficiency, unspecified: Secondary | ICD-10-CM | POA: Diagnosis not present

## 2024-04-13 DIAGNOSIS — R2681 Unsteadiness on feet: Secondary | ICD-10-CM | POA: Diagnosis not present

## 2024-04-13 DIAGNOSIS — K219 Gastro-esophageal reflux disease without esophagitis: Secondary | ICD-10-CM

## 2024-04-13 DIAGNOSIS — E038 Other specified hypothyroidism: Secondary | ICD-10-CM

## 2024-04-13 DIAGNOSIS — R252 Cramp and spasm: Secondary | ICD-10-CM

## 2024-04-13 DIAGNOSIS — H353290 Exudative age-related macular degeneration, unspecified eye, stage unspecified: Secondary | ICD-10-CM

## 2024-04-13 DIAGNOSIS — R093 Abnormal sputum: Secondary | ICD-10-CM

## 2024-04-13 DIAGNOSIS — I10 Essential (primary) hypertension: Secondary | ICD-10-CM | POA: Diagnosis not present

## 2024-04-13 NOTE — Patient Instructions (Signed)

## 2024-04-14 DIAGNOSIS — R2681 Unsteadiness on feet: Secondary | ICD-10-CM | POA: Diagnosis not present

## 2024-04-14 DIAGNOSIS — H20011 Primary iridocyclitis, right eye: Secondary | ICD-10-CM | POA: Diagnosis not present

## 2024-04-16 ENCOUNTER — Encounter: Payer: Self-pay | Admitting: Family Medicine

## 2024-04-16 NOTE — Progress Notes (Signed)
 Subjective:    Patient ID: Catherine Coleman, female    DOB: 04/08/1932, 89 y.o.   MRN: [id number]  Chief Complaint  Patient presents with  . Medical Management of Chronic Issues    Patient presents today for a 2 month follow-up.  . Quality Metric Gaps    AWV, foot & eye exam    HPI Discussed the use of AI scribe software for clinical note transcription with the patient, who gave verbal consent to proceed.  History of Present Illness Catherine Coleman is a 89 year old female with diabetes who presents for diabetic management and education.  Her last HbA1c was 6.6%, which is above the normal range of 4.6 to 6.5%. In April, her HbA1c was 5.9%. She is currently diet-controlled and her glucose level was 120 mg/dL. She wants diabetic education and guidance on managing her carbohydrate intake, including her dietary habits of consuming bread and potatoes. She has not been regularly checking her blood sugar levels but is open to monitoring them weekly.  She is currently taking vitamin D , B12, and omeprazole , and has not needed famotidine  for months. She uses Myrbetriq  and notes the availability of a generic version. She experiences mucus at night, feeling like she is choking, and has not been using nasal saline flushes or drinking enough water to manage it.  She is undergoing physical therapy for balance issues and uses a cane. She is noticing her balance is getting worse and her last physical therapy session is approaching. She is considering further therapy and mentions her physical therapy is focused on improving her stamina. She is also participating in pelvic rehab, which she finds helpful She is actively trying to manage her health and mentions feeling guilty about frequent doctor visits.    Past Medical History:  Diagnosis Date  . Arthritis   . Atrophic rhinitis 04/21/2016  . Back pain   . Breast cancer (HCC) 12/2020   left breast IDC  . Constipation   . Decreased hearing   . Diabetes  (HCC)   . Diverticulitis   . Dry eyes   . Fatigue   . Floaters in visual field   . Gall bladder disease   . GERD (gastroesophageal reflux disease)   . Glaucoma   . Hiatal hernia with gastroesophageal reflux   . History of blood transfusion    for Knee replacement  . Hyperlipidemia, mild 04/02/2016  . Hypertension   . Hypothyroidism   . Joint pain   . Migraine aura without headache   . Muscle pain   . Muscle stiffness   . Nasal congestion   . Obesity 04/02/2016  . Osteoarthritis   . Parotiditis 04/21/2016  . Pre-diabetes   . Vitamin D  deficiency 04/02/2016    Past Surgical History:  Procedure Laterality Date  . APPENDECTOMY    . BREAST LUMPECTOMY WITH RADIOACTIVE SEED LOCALIZATION Left 01/21/2021   Procedure: LEFT BREAST LUMPECTOMY WITH RADIOACTIVE SEED LOCALIZATION;  Surgeon: Vernetta Berg, MD;  Location: [facility];  Service: General;  Laterality: Left;  . cataract surgery  2008  . CHOLECYSTECTOMY  2010  . JOINT REPLACEMENT Left    2012  . MASTOIDECTOMY    . POSTERIOR CERVICAL FUSION/FORAMINOTOMY N/A 04/27/2017   Procedure: CERVICAL FIVE-SIX  OPEN REDUCTION OF FRACTURE, CERVICAL THREE-SEVEN  DORSAL FIXATION AND FUSION;  Surgeon: Ditty, Morene Hicks, MD;  Location: [facility] OR;  Service: Neurosurgery;  Laterality: N/A;  . SHOULDER ARTHROSCOPY Bilateral prior to 2010  . TONSILLECTOMY    .  TOTAL HIP ARTHROPLASTY Right 03/22/2015   Procedure: RIGHT TOTAL HIP ARTHROPLASTY ANTERIOR APPROACH;  Surgeon: Norleen Gavel, MD;  Location: [facility] OR;  Service: Orthopedics;  Laterality: Right;  . TOTAL KNEE ARTHROPLASTY Bilateral 2007  . TUBAL LIGATION      Family History  Problem Relation Age of Onset  . Dementia Mother   . Hypertension Mother   . Obesity Mother   . Diabetes Mother   . Heart disease Father   . Hypertension Father   . Stroke Father   . Diabetes Father   . Hyperlipidemia Father   . Alcoholism Father   . Obesity Father   . Cancer Maternal Grandfather         colon cancer  . Arthritis Daughter     Social History   Socioeconomic History  . Marital status: Widowed    Spouse name: Not on file  . Number of children: Not on file  . Years of education: Not on file  . Highest education level: Not on file  Occupational History  . Occupation: Retired  Tobacco Use  . Smoking status: Never  . Smokeless tobacco: Never  . Tobacco comments:    Stopped 50 years ago.   Vaping Use  . Vaping status: Never Used  Substance and Sexual Activity  . Alcohol use: Yes    Alcohol/week: 0.0 standard drinks of alcohol    Comment: socially 3-4x/wk  . Drug use: No  . Sexual activity: Not Currently  Other Topics Concern  . Not on file  Social History Narrative   Lives at [facility], widowed 7 years, no dietary restrictions. Retired from neuropsychiatry work.    Social Drivers of Health   Financial Resource Strain: Low Risk  (10/21/2022)   Overall Financial Resource Strain (CARDIA)   . Difficulty of Paying Living Expenses: Not hard at all  Food Insecurity: No Food Insecurity (10/21/2022)   Hunger Vital Sign   . Worried About Programme researcher, broadcasting/film/video in the Last Year: Never true   . Ran Out of Food in the Last Year: Never true  Transportation Needs: No Transportation Needs (10/21/2022)   PRAPARE - Transportation   . Lack of Transportation (Medical): No   . Lack of Transportation (Non-Medical): No  Physical Activity: Sufficiently Active (10/21/2022)   Exercise Vital Sign   . Days of Exercise per Week: 3 days   . Minutes of Exercise per Session: 60 min  Stress: No Stress Concern Present (10/21/2022)   [employer] of Occupational Health - Occupational Stress Questionnaire   . Feeling of Stress : Not at all  Social Connections: Moderately Isolated (10/21/2022)   Social Connection and Isolation Panel   . Frequency of Communication with Friends and Family: More than three times a week   . Frequency of Social Gatherings with Friends and Family: More than  three times a week   . Attends Religious Services: Never   . Active Member of Clubs or Organizations: Yes   . Attends Banker Meetings: 1 to 4 times per year   . Marital Status: Widowed  Intimate Partner Violence: Not At Risk (10/21/2022)   Humiliation, Afraid, Rape, and Kick questionnaire   . Fear of Current or Ex-Partner: No   . Emotionally Abused: No   . Physically Abused: No   . Sexually Abused: No    Outpatient Medications Prior to Visit  Medication Sig Dispense Refill  . anastrozole  (ARIMIDEX ) 1 MG tablet TAKE 1 TABLET BY MOUTH DAILY 100 tablet 2  .  brimonidine  (ALPHAGAN ) 0.2 % ophthalmic solution Place 1 drop into both eyes 2 (two) times daily. 5 mL 12  . Calcium  Carb-Cholecalciferol 600-10 MG-MCG TABS Take 1 tablet by mouth daily.    . carvedilol  (COREG ) 3.125 MG tablet Take 1 tablet (3.125 mg total) by mouth 2 (two) times daily with a meal. 60 tablet 3  . estradiol  (ESTRACE ) 0.1 MG/GM vaginal cream Place 0.5 g vaginally 2 (two) times a week. Place 0.5g nightly for two weeks then twice a week after 42 g 11  . famotidine  (PEPCID ) 40 MG tablet TAKE 1 TABLET BY MOUTH AT  BEDTIME AS NEEDED FOR HEARTBURN  OR INDIGESTION (Patient taking differently: Take 40 mg by mouth at bedtime.) 100 tablet 2  . fluticasone  (FLONASE ) 50 MCG/ACT nasal spray Place 2 sprays into both nostrils daily. 16 g 6  . latanoprost (XALATAN) 0.005 % ophthalmic solution Place 1 drop into both eyes at bedtime.    . levothyroxine  (SYNTHROID ) 137 MCG tablet Take 1 tablet (137 mcg total) by mouth daily before breakfast. 90 tablet 1  . losartan  (COZAAR ) 50 MG tablet TAKE 1 TABLET BY MOUTH DAILY 100 tablet 2  . MAGNESIUM -POTASSIUM PO Take 1 tablet by mouth daily.    . Multiple Vitamins-Minerals (PRESERVISION AREDS PO) Take 1 tablet by mouth daily.    . MYRBETRIQ  50 MG TB24 tablet Take 50 mg by mouth daily.    . omeprazole  (PRILOSEC) 40 MG capsule TAKE 1 CAPSULE BY MOUTH DAILY 100 capsule 2  . rosuvastatin   (CRESTOR ) 5 MG tablet TAKE 1 TABLET BY MOUTH DAILY 100 tablet 2  . Vitamin D , Cholecalciferol, 25 MCG (1000 UT) CAPS Take 1 capsule by mouth daily.     No facility-administered medications prior to visit.    No Known Allergies  Review of Systems  Constitutional:  Positive for malaise/fatigue. Negative for fever.  HENT:  Negative for congestion.   Eyes:  Negative for blurred vision.  Respiratory:  Negative for shortness of breath.   Cardiovascular:  Negative for chest pain, palpitations and leg swelling.  Gastrointestinal:  Negative for abdominal pain, blood in stool and nausea.  Genitourinary:  Negative for dysuria and frequency.  Musculoskeletal:  Positive for back pain. Negative for falls.  Skin:  Negative for rash.  Neurological:  Positive for weakness. Negative for dizziness, loss of consciousness and headaches.  Endo/Heme/Allergies:  Negative for environmental allergies.  Psychiatric/Behavioral:  Negative for depression. The patient is not nervous/anxious.        Objective:    Physical Exam Constitutional:      General: She is not in acute distress.    Appearance: Normal appearance. She is well-developed. She is not toxic-appearing.  HENT:     Head: Normocephalic and atraumatic.     Right Ear: External ear normal.     Left Ear: External ear normal.     Nose: Nose normal.  Eyes:     General:        Right eye: No discharge.        Left eye: No discharge.     Conjunctiva/sclera: Conjunctivae normal.  Neck:     Thyroid : No thyromegaly.  Cardiovascular:     Rate and Rhythm: Normal rate and regular rhythm.     Heart sounds: Normal heart sounds. No murmur heard. Pulmonary:     Effort: Pulmonary effort is normal. No respiratory distress.     Breath sounds: Normal breath sounds.  Abdominal:     General: Bowel sounds are normal.  Palpations: Abdomen is soft.     Tenderness: There is no abdominal tenderness. There is no guarding.  Musculoskeletal:        General:  Normal range of motion.     Cervical back: Neck supple.  Lymphadenopathy:     Cervical: No cervical adenopathy.  Skin:    General: Skin is warm and dry.  Neurological:     Mental Status: She is alert and oriented to person, place, and time.  Psychiatric:        Mood and Affect: Mood normal.        Behavior: Behavior normal.        Thought Content: Thought content normal.        Judgment: Judgment normal.    BP 122/74   Pulse 61   Resp 16   Ht 5' 5 (1.651 m)   Wt 197 lb (89.4 kg)   SpO2 94%   BMI 32.78 kg/m  Wt Readings from Last 3 Encounters:  04/13/24 197 lb (89.4 kg)  03/13/24 191 lb (86.6 kg)  03/02/24 196 lb 9.6 oz (89.2 kg)    Diabetic Foot Exam - Simple   No data filed    Lab Results  Component Value Date   WBC 4.5 04/10/2024   HGB 13.3 04/10/2024   HCT 39.8 04/10/2024   PLT 214.0 04/10/2024   GLUCOSE 120 (H) 04/10/2024   CHOL 160 04/10/2024   TRIG 110.0 04/10/2024   HDL 62.40 04/10/2024   LDLDIRECT 138.0 05/15/2019   LDLCALC 76 04/10/2024   ALT 13 04/10/2024   AST 18 04/10/2024   NA 137 04/10/2024   K 5.3 No hemolysis seen (H) 04/10/2024   CL 100 04/10/2024   CREATININE 0.59 04/10/2024   BUN 17 04/10/2024   CO2 29 04/10/2024   TSH 0.29 (L) 04/10/2024   INR 1.05 04/28/2017   HGBA1C 6.6 (H) 04/10/2024    Lab Results  Component Value Date   TSH 0.29 (L) 04/10/2024   Lab Results  Component Value Date   WBC 4.5 04/10/2024   HGB 13.3 04/10/2024   HCT 39.8 04/10/2024   MCV 93.9 04/10/2024   PLT 214.0 04/10/2024   Lab Results  Component Value Date   NA 137 04/10/2024   K 5.3 No hemolysis seen (H) 04/10/2024   CO2 29 04/10/2024   GLUCOSE 120 (H) 04/10/2024   BUN 17 04/10/2024   CREATININE 0.59 04/10/2024   BILITOT 0.6 04/10/2024   ALKPHOS 100 04/10/2024   AST 18 04/10/2024   ALT 13 04/10/2024   PROT 6.6 04/10/2024   ALBUMIN 4.4 04/10/2024   CALCIUM  9.2 04/10/2024   ANIONGAP 11 03/02/2022   EGFR 83 12/27/2023   GFR 79.49 04/10/2024    Lab Results  Component Value Date   CHOL 160 04/10/2024   Lab Results  Component Value Date   HDL 62.40 04/10/2024   Lab Results  Component Value Date   LDLCALC 76 04/10/2024   Lab Results  Component Value Date   TRIG 110.0 04/10/2024   Lab Results  Component Value Date   CHOLHDL 3 04/10/2024   Lab Results  Component Value Date   HGBA1C 6.6 (H) 04/10/2024       Assessment & Plan:  Exudative age-related macular degeneration, unspecified laterality, unspecified stage (HCC) Assessment & Plan: Follows with opthamology   Hyperlipidemia associated with type 2 diabetes mellitus (HCC) Assessment & Plan: Encourage heart healthy diet such as MIND or DASH diet, increase exercise, avoid trans fats, simple carbohydrates and  processed foods, consider a krill or fish or flaxseed oil cap daily.  hgba1c acceptable, minimize simple carbs. Increase exercise as tolerated. Continue current meds   Orders: -     Lipid panel; Future  Primary hypertension Assessment & Plan: Well controlled, no changes to meds. Encouraged heart healthy diet such as the DASH diet and exercise as tolerated.    Orders: -     Comprehensive metabolic panel with GFR; Future -     CBC with Differential/Platelet; Future  Other specified hypothyroidism Assessment & Plan: On Levothyroxine , continue to monitor  Orders: -     TSH; Future  Muscle cramps Assessment & Plan: Hydrate and monitor    Type 2 diabetes mellitus with obesity (HCC) Assessment & Plan: hgba1c acceptable, minimize simple carbs. Increase exercise as tolerated. Set up with glucometer, lancets and test strips to check sugars daily to weekly and prn  Orders: -     Amb ref to Medical Nutrition Therapy-MNT -     Hemoglobin A1c; Future -     Microalbumin / creatinine urine ratio; Future  Vitamin B 12 deficiency Assessment & Plan: Supplement and monitor   Orders: -     Vitamin B12; Future  Vitamin D  deficiency Assessment &  Plan: Supplement and monitor   Orders: -     VITAMIN D  25 Hydroxy (Vit-D Deficiency, Fractures); Future    Assessment and Plan Assessment & Plan Type 2 diabetes mellitus, diet-controlled A1c at 6.6 and glucose at 120 indicate diabetes, currently managed by diet. - Discussed dietary management, limiting carbohydrates to one per meal and ensuring protein intake. - Emphasized weekly monitoring of blood glucose levels, fasting and postprandial. - Encouraged physical activity. - Discussed impact of liquid calories on A1c and benefits of reducing alcohol. - Ordered blood work in 3 months to recheck A1c. - Provided referral for diabetic education with a nutritionist. - Provided educational materials on carbohydrate counting and dietary management.  Balance impairment Balance issues present, using a cane, undergoing physical therapy with potential need for further sessions. - Discuss with physical therapist to determine need for further therapy and Medicare coverage. - Consider referral for additional physical therapy if progress noted.  Overactive bladder with urinary incontinence Managed with Myrbetriq , now available in generic form. Pelvic rehabilitation beneficial. - Switch to generic Myrbetriq  to reduce cost. - Continue pelvic rehabilitation.  Mucus production with nocturnal choking sensation Mucus production causing nocturnal choking, dehydration can thicken mucus. - Encourage hydration to thin mucus. - Consider using plain Mucinex  in the evening.  Vitamin D  deficiency  Vitamin B12 deficiency  Gastroesophageal reflux disease (on omeprazole ) Managed with omeprazole , discussed risks of long-term use versus untreated reflux. - Emphasize taking minimum effective dose to manage symptoms. - Consider reducing omeprazole  dosage if symptoms allow.  Recording duration: 17 minutes     Harlene Horton, MD

## 2024-04-18 DIAGNOSIS — R2681 Unsteadiness on feet: Secondary | ICD-10-CM | POA: Diagnosis not present

## 2024-04-20 DIAGNOSIS — R2681 Unsteadiness on feet: Secondary | ICD-10-CM | POA: Diagnosis not present

## 2024-04-27 ENCOUNTER — Other Ambulatory Visit: Payer: Self-pay | Admitting: Family Medicine

## 2024-05-02 ENCOUNTER — Encounter: Payer: Self-pay | Admitting: *Deleted

## 2024-05-02 DIAGNOSIS — R350 Frequency of micturition: Secondary | ICD-10-CM | POA: Diagnosis not present

## 2024-05-02 DIAGNOSIS — M6281 Muscle weakness (generalized): Secondary | ICD-10-CM | POA: Diagnosis not present

## 2024-05-02 DIAGNOSIS — M6289 Other specified disorders of muscle: Secondary | ICD-10-CM | POA: Diagnosis not present

## 2024-05-10 DIAGNOSIS — R2681 Unsteadiness on feet: Secondary | ICD-10-CM | POA: Diagnosis not present

## 2024-05-11 DIAGNOSIS — H353211 Exudative age-related macular degeneration, right eye, with active choroidal neovascularization: Secondary | ICD-10-CM | POA: Diagnosis not present

## 2024-05-12 DIAGNOSIS — R2681 Unsteadiness on feet: Secondary | ICD-10-CM | POA: Diagnosis not present

## 2024-05-15 DIAGNOSIS — R2681 Unsteadiness on feet: Secondary | ICD-10-CM | POA: Diagnosis not present

## 2024-05-16 ENCOUNTER — Ambulatory Visit (INDEPENDENT_AMBULATORY_CARE_PROVIDER_SITE_OTHER): Admitting: Nurse Practitioner

## 2024-05-16 ENCOUNTER — Encounter: Payer: Self-pay | Admitting: Nurse Practitioner

## 2024-05-16 VITALS — BP 136/81 | HR 66 | Temp 98.0°F | Ht 65.0 in | Wt 194.0 lb

## 2024-05-16 DIAGNOSIS — Z6832 Body mass index (BMI) 32.0-32.9, adult: Secondary | ICD-10-CM

## 2024-05-16 DIAGNOSIS — E1169 Type 2 diabetes mellitus with other specified complication: Secondary | ICD-10-CM | POA: Diagnosis not present

## 2024-05-16 DIAGNOSIS — E66811 Obesity, class 1: Secondary | ICD-10-CM | POA: Diagnosis not present

## 2024-05-16 NOTE — Progress Notes (Signed)
 Office: [phone]  /  Fax: [phone]  WEIGHT SUMMARY AND BIOMETRICS  Weight Lost Since Last Visit: 0lb  Weight Gained Since Last Visit: 3lb   Vitals Temp: 98 F (36.7 C) BP: 136/81 Pulse Rate: 66 SpO2: 95 %   Anthropometric Measurements Height: 5' 5 (1.651 m) Weight: 194 lb (88 kg) BMI (Calculated): 32.28 Weight at Last Visit: 191lb Weight Lost Since Last Visit: 0lb Weight Gained Since Last Visit: 3lb Starting Weight: 211lb Total Weight Loss (lbs): 17 lb (7.711 kg)   Body Composition  Body Fat %: 46.2 % Fat Mass (lbs): 89.6 lbs Muscle Mass (lbs): 99.2 lbs Total Body Water (lbs): 71.8 lbs Visceral Fat Rating : 16   Other Clinical Data Fasting: Yes Labs: No Today's Visit #: 18 Starting Date: 10/26/22     HPI  Chief Complaint: OBESITY  Catherine Coleman is here to discuss her progress with her obesity treatment plan. She is on the the Category 2 Plan and states she is following her eating plan approximately 0 % of the time. She states she is exercising 60 minutes 3 days per week.   Interval History:  Since last office visit she has gained 3 pounds.  She is not currently following any meal plans or tracking her calories or macros.  She recently went on vacation and was eating 3 large meal daily.  She is drinking water with flavoring, propel water, coffee and wine a couple glasses nightly.  She is walking in the pool 3 days per week and going to PT 2 days per week for balance/endurance for a total of 8 weeks.    BF:  skips or roast beef wrapped around cheese Lunch:  11 am-salad no protein and sometimes fruit Snack:  crackers, cheese, deli meat, hummus with vegetables Dinner:  protein-chicken, shrimp or fish, potato and a vegetable.  Snack:  popcorn  Her highest weight was 224 lbs.    Pharmacotherapy for weight loss: She is not currently taking medications  for medical weight loss.  Pharmacotherapy for DMT2:   She is not currently taking medication at this  time.    Last A1c was 6.6-worsening  She is not checking BS at home.   Episodes of hypoglycemia: no On ACE and statin  Last eye exam:  scheduled in October -She has tried Ozempic  and Metformin  in the past.  -She stopped Ozempic  due to side effects.  -She retried taking Metformin  and stopped due to side effects of GI upset.   She has an appt with RD on 06/06/24  Lab Results  Component Value Date   HGBA1C 6.6 (H) 04/10/2024   HGBA1C 5.9 (H) 12/27/2023   HGBA1C 6.1 10/14/2023   Lab Results  Component Value Date   LDLCALC 76 04/10/2024   CREATININE 0.59 04/10/2024      PHYSICAL EXAM:  Blood pressure 136/81, pulse 66, temperature 98 F (36.7 C), height 5' 5 (1.651 m), weight 194 lb (88 kg), SpO2 95%. Body mass index is 32.28 kg/m.  General: She is overweight, cooperative, alert, well developed, and in no acute distress. PSYCH: Has normal mood, affect and thought process.   Extremities: No edema.  Neurologic: No gross sensory or motor deficits. No tremors or fasciculations noted.    DIAGNOSTIC DATA REVIEWED:  BMET    Component Value Date/Time   NA 137 04/10/2024 0956   NA 136 12/27/2023 1042   K 5.3 No hemolysis seen (H) 04/10/2024 0956   CL 100 04/10/2024 0956   CO2 29 04/10/2024 0956  GLUCOSE 120 (H) 04/10/2024 0956   BUN 17 04/10/2024 0956   BUN 13 12/27/2023 1042   CREATININE 0.59 04/10/2024 0956   CREATININE 0.76 01/02/2022 1331   CALCIUM  9.2 04/10/2024 0956   GFRNONAA >60 03/02/2022 1307   GFRNONAA >60 01/02/2022 1331   GFRAA 94 09/28/2018 1147   Lab Results  Component Value Date   HGBA1C 6.6 (H) 04/10/2024   HGBA1C 6.4 10/18/2017   Lab Results  Component Value Date   INSULIN  7.9 10/26/2022   INSULIN  15.5 05/30/2018   Lab Results  Component Value Date   TSH 0.29 (L) 04/10/2024   CBC    Component Value Date/Time   WBC 4.5 04/10/2024 0956   RBC 4.24 04/10/2024 0956   HGB 13.3 04/10/2024 0956   HGB 13.1 12/27/2023 1042   HCT 39.8 04/10/2024  0956   HCT 39.6 12/27/2023 1042   PLT 214.0 04/10/2024 0956   PLT 200 12/27/2023 1042   MCV 93.9 04/10/2024 0956   MCV 99 (H) 12/27/2023 1042   MCH 32.7 12/27/2023 1042   MCH 31.9 08/12/2023 0829   MCHC 33.4 04/10/2024 0956   RDW 13.4 04/10/2024 0956   RDW 13.1 12/27/2023 1042   Iron Studies No results found for: IRON, TIBC, FERRITIN, IRONPCTSAT Lipid Panel     Component Value Date/Time   CHOL 160 04/10/2024 0956   CHOL 226 (H) 09/28/2018 1147   TRIG 110.0 04/10/2024 0956   HDL 62.40 04/10/2024 0956   HDL 60 09/28/2018 1147   CHOLHDL 3 04/10/2024 0956   VLDL 22.0 04/10/2024 0956   LDLCALC 76 04/10/2024 0956   LDLCALC 134 (H) 09/28/2018 1147   LDLDIRECT 138.0 05/15/2019 1022   Hepatic Function Panel     Component Value Date/Time   PROT 6.6 04/10/2024 0956   PROT 6.5 12/27/2023 1042   ALBUMIN 4.4 04/10/2024 0956   ALBUMIN 4.4 12/27/2023 1042   AST 18 04/10/2024 0956   AST 20 01/02/2022 1331   ALT 13 04/10/2024 0956   ALT 19 01/02/2022 1331   ALKPHOS 100 04/10/2024 0956   BILITOT 0.6 04/10/2024 0956   BILITOT 0.3 12/27/2023 1042   BILITOT 0.3 01/02/2022 1331      Component Value Date/Time   TSH 0.29 (L) 04/10/2024 0956   Nutritional Lab Results  Component Value Date   VD25OH 39.13 04/10/2024   VD25OH 34.71 10/14/2023   VD25OH 62.50 01/15/2023     ASSESSMENT AND PLAN  TREATMENT PLAN FOR OBESITY:  Recommended Dietary Goals  Erick is currently in the action stage of change. As such, her goal is to continue weight management plan. She has agreed to practicing portion control and making smarter food choices, such as increasing vegetables and decreasing simple carbohydrates.  Behavioral Intervention  We discussed the following Behavioral Modification Strategies today: increasing lean protein intake to established goals, decreasing simple carbohydrates , increasing vegetables, increasing fiber rich foods, increasing water intake , work on meal  planning and preparation, reading food labels , keeping healthy foods at home, continue to work on maintaining a reduced calorie state, getting the recommended amount of protein, incorporating whole foods, making healthy choices, staying well hydrated and practicing mindfulness when eating., and increase protein intake, fibrous foods (25 grams per day for women, 30 grams for men) and water to improve satiety and decrease hunger signals. .  Additional resources provided today: NA  Recommended Physical Activity Goals  Taressa has been advised to work up to 150 minutes of moderate intensity aerobic activity a week  and strengthening exercises 2-3 times per week for cardiovascular health, weight loss maintenance and preservation of muscle mass.   She has agreed to Think about enjoyable ways to increase daily physical activity and overcoming barriers to exercise, Increase physical activity in their day and reduce sedentary time (increase NEAT)., and Continue to gradually increase the amount and intensity of exercise routine  Continue PT   ASSOCIATED CONDITIONS ADDRESSED TODAY  Action/Plan  Type 2 diabetes mellitus with obesity (HCC) Continue to follow up with Dr. Domenica   I don't recommend restarting Metformin  or Ozempic . Patient had side effects to both.    Keep appt with RD  Class 1 obesity due to excess calories with body mass index (BMI) of 32.0 to 32.9 in adult, unspecified whether serious comorbidity present         Return in about 5 weeks (around 06/20/2024).SABRA She was informed of the importance of frequent follow up visits to maximize her success with intensive lifestyle modifications for her multiple health conditions.   ATTESTASTION STATEMENTS:  Reviewed by clinician on day of visit: allergies, medications, problem list, medical history, surgical history, family history, social history, and previous encounter notes.   I personally spent a total of 30+ minutes in the care of the  patient today including preparing to see the patient, getting/reviewing separately obtained history, performing a medically appropriate exam/evaluation, counseling and educating, independently interpreting results, and communicating results.    Corean SAUNDERS. Lorenna Lurry FNP-C

## 2024-05-17 ENCOUNTER — Telehealth: Payer: Self-pay | Admitting: Family Medicine

## 2024-05-17 DIAGNOSIS — R2681 Unsteadiness on feet: Secondary | ICD-10-CM | POA: Diagnosis not present

## 2024-05-17 NOTE — Telephone Encounter (Signed)
 Copied from CRM #[id number]. Topic: Medicare AWV >> May 17, 2024  9:00 AM Nathanel DEL wrote: Reason for CRM: Called LVM 05/17/2024 to schedule AWV. Please schedule office or virtual visits.  Nathanel Paschal; Care Guide Ambulatory Clinical Support [place] Direct Dial: [phone]

## 2024-05-19 DIAGNOSIS — M6281 Muscle weakness (generalized): Secondary | ICD-10-CM | POA: Diagnosis not present

## 2024-05-19 DIAGNOSIS — M6289 Other specified disorders of muscle: Secondary | ICD-10-CM | POA: Diagnosis not present

## 2024-05-19 DIAGNOSIS — R350 Frequency of micturition: Secondary | ICD-10-CM | POA: Diagnosis not present

## 2024-05-26 DIAGNOSIS — H35363 Drusen (degenerative) of macula, bilateral: Secondary | ICD-10-CM | POA: Diagnosis not present

## 2024-05-26 DIAGNOSIS — R2681 Unsteadiness on feet: Secondary | ICD-10-CM | POA: Diagnosis not present

## 2024-05-26 DIAGNOSIS — H353122 Nonexudative age-related macular degeneration, left eye, intermediate dry stage: Secondary | ICD-10-CM | POA: Diagnosis not present

## 2024-05-26 DIAGNOSIS — H353211 Exudative age-related macular degeneration, right eye, with active choroidal neovascularization: Secondary | ICD-10-CM | POA: Diagnosis not present

## 2024-05-26 DIAGNOSIS — H35033 Hypertensive retinopathy, bilateral: Secondary | ICD-10-CM | POA: Diagnosis not present

## 2024-05-26 DIAGNOSIS — H02051 Trichiasis without entropian right upper eyelid: Secondary | ICD-10-CM | POA: Diagnosis not present

## 2024-05-29 DIAGNOSIS — R2681 Unsteadiness on feet: Secondary | ICD-10-CM | POA: Diagnosis not present

## 2024-06-02 ENCOUNTER — Telehealth: Payer: Self-pay

## 2024-06-02 DIAGNOSIS — H20011 Primary iridocyclitis, right eye: Secondary | ICD-10-CM | POA: Diagnosis not present

## 2024-06-02 DIAGNOSIS — E119 Type 2 diabetes mellitus without complications: Secondary | ICD-10-CM | POA: Diagnosis not present

## 2024-06-02 DIAGNOSIS — H401412 Capsular glaucoma with pseudoexfoliation of lens, right eye, moderate stage: Secondary | ICD-10-CM | POA: Diagnosis not present

## 2024-06-02 DIAGNOSIS — E1169 Type 2 diabetes mellitus with other specified complication: Secondary | ICD-10-CM

## 2024-06-02 DIAGNOSIS — Z961 Presence of intraocular lens: Secondary | ICD-10-CM | POA: Diagnosis not present

## 2024-06-02 DIAGNOSIS — H401421 Capsular glaucoma with pseudoexfoliation of lens, left eye, mild stage: Secondary | ICD-10-CM | POA: Diagnosis not present

## 2024-06-02 LAB — HM DIABETES EYE EXAM

## 2024-06-02 MED ORDER — BLOOD GLUCOSE TEST VI STRP: 1.0000 | ORAL_STRIP | Freq: Three times a day (TID) | 1 refills | Status: AC

## 2024-06-02 MED ORDER — LANCETS MISC. MISC: 1.0000 | Freq: Every day | 1 refills | Status: DC

## 2024-06-02 MED ORDER — BLOOD GLUCOSE MONITORING SUPPL DEVI: 1.0000 | Freq: Three times a day (TID) | 0 refills | Status: DC

## 2024-06-02 MED ORDER — LANCET DEVICE MISC: 1.0000 | Freq: Three times a day (TID) | 0 refills | Status: AC

## 2024-06-02 MED ORDER — BLOOD GLUCOSE MONITORING SUPPL DEVI: 0 refills | Status: DC

## 2024-06-02 MED ORDER — LANCETS MISC. MISC: 1.0000 | Freq: Three times a day (TID) | 0 refills | Status: AC

## 2024-06-02 MED ORDER — BLOOD GLUCOSE TEST VI STRP: ORAL_STRIP | 1 refills | Status: DC

## 2024-06-02 MED ORDER — LANCET DEVICE MISC: 1.0000 | Freq: Every day | 0 refills | Status: DC

## 2024-06-02 NOTE — Telephone Encounter (Signed)
 Copied from CRM [id number]. Topic: Clinical - Order For Equipment >> Jun 02, 2024 10:20 AM Catherine Coleman wrote: Reason for CRM: pt is requesting a continuous glucose monitor,she at the pharmacy now getting a covid shot and would like for it to be sent today if possible.

## 2024-06-02 NOTE — Telephone Encounter (Signed)
 Called patient to get more information and no answer left vm to return call. Is it ok to send in meter for patient?

## 2024-06-02 NOTE — Telephone Encounter (Signed)
 Patient was advised and verbalized understanding.

## 2024-06-05 DIAGNOSIS — R350 Frequency of micturition: Secondary | ICD-10-CM | POA: Diagnosis not present

## 2024-06-05 DIAGNOSIS — M6281 Muscle weakness (generalized): Secondary | ICD-10-CM | POA: Diagnosis not present

## 2024-06-05 DIAGNOSIS — M6289 Other specified disorders of muscle: Secondary | ICD-10-CM | POA: Diagnosis not present

## 2024-06-06 ENCOUNTER — Encounter: Payer: Self-pay | Admitting: Skilled Nursing Facility1

## 2024-06-06 ENCOUNTER — Encounter: Attending: Family Medicine | Admitting: Skilled Nursing Facility1

## 2024-06-06 DIAGNOSIS — L97509 Non-pressure chronic ulcer of other part of unspecified foot with unspecified severity: Secondary | ICD-10-CM | POA: Insufficient documentation

## 2024-06-06 DIAGNOSIS — E11621 Type 2 diabetes mellitus with foot ulcer: Secondary | ICD-10-CM | POA: Diagnosis not present

## 2024-06-06 NOTE — Progress Notes (Signed)
 Medical Nutrition Therapy  Appointment Start time:  1:53  Appointment End time: 2:45  Primary concerns today: Pt states she wants to count calories   Referral diagnosis: DM   NUTRITION ASSESSMENT    Clinical Medical Hx: DM, HTN, Hypothyroid Medications: Labs: A1C 6.6 Notable Signs/Symptoms: none reported   Lifestyle & Dietary Hx  Pt state she eats out 4 days a  week and eats in the rest of the week.  Pt state she likes to weave and die.  Pt states she has been trying to do the balance class and has had PT for balance.  Pt states she will start the circuits class.   Estimated daily fluid intake:  oz Supplements:  Sleep:  Stress / self-care:  Current average weekly physical activity: walking in the swimming pool 60 minutes 3 days a week  24-Hr Dietary Recall: wakes around 6am First Meal: 2 cups coffee: k cup caramel cappuccino + spinach and feta pastry or skipped Snack: crackers or veggie and hummus  Second Meal: 1/2 club sandwich with hummus Snack: thin pretzels and chocolate covered granola bars Third Meal: pork BBQ, tossed salad, green beans Snack: cheese and roast beef Beverages: wine, water + crystal light, coffee, propel water, herbal    NUTRITION INTERVENTION  Nutrition education (E-1) on the following topics:  Creation of balanced and diverse meals to increase the intake of nutrient-rich foods that provide essential vitamins, minerals, fiber, and phytonutrients Variety of Fruits and Vegetables: Aim for a colorful array of fruits and vegetables to ensure a wide range of nutrients. Include a mix of leafy greens, berries, citrus fruits, cruciferous vegetables, and more. Whole Grains: Choose whole grains over refined grains. Examples include brown rice, quinoa, oats, whole wheat, and barley. Lean Proteins: Include lean sources of protein, such as poultry, fish, tofu, legumes, beans, lentils, and low-fat dairy products. Limit red and processed meats. Healthy  Fats: Incorporate sources of healthy fats, including avocados, nuts, seeds, and olive oil. Limit saturated and trans fats found in fried and processed foods. Dairy or Dairy Alternatives: Choose low-fat or fat-free dairy products, or plant-based alternatives like almond or soy milk. Portion Control: Be mindful of portion sizes to avoid overeating. Pay attention to hunger and satisfaction cues. Limit Added Sugars: Minimize the consumption of sugary beverages, snacks, and desserts. Check food labels for added sugars and opt for natural sources of sweetness such as whole fruits. Hydration: Drink plenty of water throughout the day. Limit sugary drinks and excessive caffeine intake. Moderate Sodium Intake: Reduce the consumption of high-sodium foods. Use herbs and spices for flavor instead of excessive salt. Meal Planning and Preparation: Plan and prepare meals ahead of time to make healthier choices more convenient. Include a mix of food groups in each meal. Limit Processed Foods: Minimize the intake of highly processed and packaged foods that are often high in added sugars, salt, and unhealthy fats. Regular Physical Activity: Combine a healthy diet with regular physical activity for overall well-being. Aim for at least 150 minutes of moderate-intensity aerobic exercise per week, along with strength training. Moderation and Balance: Enjoy treats and indulgent foods in moderation, emphasizing balance rather than strict restriction.   Learning Style & Readiness for Change Teaching method utilized: Visual & Auditory  Demonstrated degree of understanding via: Teach Back  Barriers to learning/adherence to lifestyle change: pt stated motivation   Goals Established by Pt Reduce hummus Have breakfast Cut back on lunch meal Only 1 granola bar per day  Eat every 3 hours  1 Goody per day    MONITORING & EVALUATION Dietary intake, weekly physical activity  Next Steps  Patient is to call or  email with any future questions or concerns.

## 2024-06-13 DIAGNOSIS — R2681 Unsteadiness on feet: Secondary | ICD-10-CM | POA: Diagnosis not present

## 2024-06-14 DIAGNOSIS — H31092 Other chorioretinal scars, left eye: Secondary | ICD-10-CM | POA: Diagnosis not present

## 2024-06-14 DIAGNOSIS — H43813 Vitreous degeneration, bilateral: Secondary | ICD-10-CM | POA: Diagnosis not present

## 2024-06-14 DIAGNOSIS — H353212 Exudative age-related macular degeneration, right eye, with inactive choroidal neovascularization: Secondary | ICD-10-CM | POA: Diagnosis not present

## 2024-06-14 DIAGNOSIS — H401133 Primary open-angle glaucoma, bilateral, severe stage: Secondary | ICD-10-CM | POA: Diagnosis not present

## 2024-06-14 DIAGNOSIS — H01021 Squamous blepharitis right upper eyelid: Secondary | ICD-10-CM | POA: Diagnosis not present

## 2024-06-14 DIAGNOSIS — H353122 Nonexudative age-related macular degeneration, left eye, intermediate dry stage: Secondary | ICD-10-CM | POA: Diagnosis not present

## 2024-06-15 ENCOUNTER — Ambulatory Visit (INDEPENDENT_AMBULATORY_CARE_PROVIDER_SITE_OTHER)

## 2024-06-15 VITALS — Ht 65.0 in | Wt 194.0 lb

## 2024-06-15 DIAGNOSIS — Z0000 Encounter for general adult medical examination without abnormal findings: Secondary | ICD-10-CM

## 2024-06-15 NOTE — Patient Instructions (Addendum)
 Catherine Coleman,  Thank you for taking the time for your Medicare Wellness Visit. I appreciate your continued commitment to your health goals. Please review the care plan we discussed, and feel free to reach out if I can assist you further.  Medicare recommends these wellness visits once per year to help you and your care team stay ahead of potential health issues. These visits are designed to focus on prevention, allowing your provider to concentrate on managing your acute and chronic conditions during your regular appointments.  Please note that Annual Wellness Visits do not include a physical exam. Some assessments may be limited, especially if the visit was conducted virtually. If needed, we may recommend a separate in-person follow-up with your provider.  Ongoing Care Seeing your primary care provider every 3 to 6 months helps us  monitor your health and provide consistent, personalized care.   Referrals If a referral was made during today's visit and you haven't received any updates within two weeks, please contact the referred provider directly to check on the status.  Recommended Screenings:  Health Maintenance  Topic Date Due   Complete foot exam   02/18/2024   Flu Shot  04/14/2024   COVID-19 Vaccine (10 - Moderna risk 2024-25 season) 05/15/2024   Hemoglobin A1C  10/11/2024   Eye exam for diabetics  06/02/2025   Medicare Annual Wellness Visit  06/15/2025   DTaP/Tdap/Td vaccine (2 - Td or Tdap) 10/24/2033   Pneumococcal Vaccine for age over 41  Completed   DEXA scan (bone density measurement)  Completed   Zoster (Shingles) Vaccine  Completed   HPV Vaccine  Aged Out   Meningitis B Vaccine  Aged Out       06/15/2024    8:22 AM  Advanced Directives  Does Patient Have a Medical Advance Directive? Yes  Type of Estate agent of [place];Living will  Does patient want to make changes to medical advance directive? No - Patient declined  Copy of Healthcare Power of  Attorney in Chart? Yes - validated most recent copy scanned in chart (See row information)   Advance Care Planning is important because it: Ensures you receive medical care that aligns with your values, goals, and preferences. Provides guidance to your family and loved ones, reducing the emotional burden of decision-making during critical moments.  Vision: Annual vision screenings are recommended for early detection of glaucoma, cataracts, and diabetic retinopathy. These exams can also reveal signs of chronic conditions such as diabetes and high blood pressure.  Dental: Annual dental screenings help detect early signs of oral cancer, gum disease, and other conditions linked to overall health, including heart disease and diabetes.  Please see the attached documents for additional preventive care recommendations.

## 2024-06-15 NOTE — Progress Notes (Signed)
 Subjective:   Catherine Coleman is a 89 y.o. who presents for a Medicare Wellness preventive visit.  As a reminder, Annual Wellness Visits don't include a physical exam, and some assessments may be limited, especially if this visit is performed virtually. We may recommend an in-person follow-up visit with your provider if needed.  Visit Complete: Virtual I connected with  Catherine Coleman on 06/15/24 by a audio enabled telemedicine application and verified that I am speaking with the correct person using two identifiers.  Patient Location: Home  Provider Location: Home Office  I discussed the limitations of evaluation and management by telemedicine. The patient expressed understanding and agreed to proceed.  Vital Signs: Because this visit was a virtual/telehealth visit, some criteria may be missing or patient reported. Any vitals not documented were not able to be obtained and vitals that have been documented are patient reported.    Persons Participating in Visit: Patient.  AWV Questionnaire: No: Patient Medicare AWV questionnaire was not completed prior to this visit.  Cardiac Risk Factors include: advanced age (>18men, >67 women);hypertension     Objective:    Today's Vitals   06/15/24 0814  Weight: 194 lb (88 kg)  Height: 5' 5 (1.651 m)   Body mass index is 32.28 kg/m.     06/15/2024    8:22 AM 10/21/2022   11:30 AM 03/02/2022   11:07 AM 02/20/2021    8:10 AM 01/21/2021    7:49 AM 01/14/2021    1:20 PM 11/11/2018    9:09 AM  Advanced Directives  Does Patient Have a Medical Advance Directive? Yes Yes No Yes Yes Yes Yes   Type of Estate agent of [place];Living will Healthcare Power of [place];Living will  Living will Healthcare Power of [place];Living will Healthcare Power of [place];Living will Healthcare Power of [place];Living will  Does patient want to make changes to medical advance directive? No - Patient declined No - Patient declined   No -  Patient declined No - Patient declined No - Patient declined   Copy of Healthcare Power of Attorney in Chart? Yes - validated most recent copy scanned in chart (See row information) Yes - validated most recent copy scanned in chart (See row information)  No - copy requested No - copy requested  Yes - validated most recent copy scanned in chart (See row information)   Would patient like information on creating a medical advance directive?    No - Patient declined        Data saved with a previous flowsheet row definition    Current Medications (verified) Outpatient Encounter Medications as of 06/15/2024  Medication Sig   anastrozole  (ARIMIDEX ) 1 MG tablet TAKE 1 TABLET BY MOUTH DAILY   Blood Glucose Monitoring Suppl DEVI 1 each by Does not apply route in the morning, at noon, and at bedtime. May substitute to any manufacturer covered by patient's insurance.   brimonidine  (ALPHAGAN ) 0.2 % ophthalmic solution Place 1 drop into both eyes 2 (two) times daily.   Calcium  Carb-Cholecalciferol 600-10 MG-MCG TABS Take 1 tablet by mouth daily.   carvedilol  (COREG ) 3.125 MG tablet Take 1 tablet (3.125 mg total) by mouth 2 (two) times daily with a meal.   estradiol  (ESTRACE ) 0.1 MG/GM vaginal cream Place 0.5 g vaginally 2 (two) times a week. Place 0.5g nightly for two weeks then twice a week after   famotidine  (PEPCID ) 40 MG tablet TAKE 1 TABLET BY MOUTH AT  BEDTIME AS NEEDED FOR HEARTBURN  OR INDIGESTION (  Patient taking differently: Take 40 mg by mouth at bedtime.)   fluticasone  (FLONASE ) 50 MCG/ACT nasal spray Place 2 sprays into both nostrils daily.   Glucose Blood (BLOOD GLUCOSE TEST STRIPS) STRP 1 each by In Vitro route in the morning, at noon, and at bedtime. May substitute to any manufacturer covered by patient's insurance.   Lancet Device MISC 1 each by Does not apply route in the morning, at noon, and at bedtime. May substitute to any manufacturer covered by patient's insurance.   Lancets Misc. MISC 1  each by Does not apply route in the morning, at noon, and at bedtime. May substitute to any manufacturer covered by patient's insurance.   latanoprost (XALATAN) 0.005 % ophthalmic solution Place 1 drop into both eyes at bedtime.   levothyroxine  (SYNTHROID ) 137 MCG tablet Take 1 tablet (137 mcg total) by mouth daily before breakfast.   losartan  (COZAAR ) 50 MG tablet TAKE 1 TABLET BY MOUTH DAILY   MAGNESIUM -POTASSIUM PO Take 1 tablet by mouth daily.   Multiple Vitamins-Minerals (PRESERVISION AREDS PO) Take 1 tablet by mouth daily.   MYRBETRIQ  50 MG TB24 tablet Take 50 mg by mouth daily.   omeprazole  (PRILOSEC) 40 MG capsule TAKE 1 CAPSULE BY MOUTH DAILY   rosuvastatin  (CRESTOR ) 5 MG tablet TAKE 1 TABLET BY MOUTH DAILY   Vitamin D , Cholecalciferol, 25 MCG (1000 UT) CAPS Take 1 capsule by mouth daily.   No facility-administered encounter medications on file as of 06/15/2024.    Allergies (verified) Patient has no known allergies.   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   Constipation    Decreased hearing    Diabetes (HCC)    Diverticulitis    Dry eyes    Fatigue    Floaters in visual field    Gall bladder disease    GERD (gastroesophageal reflux disease)    Glaucoma    Hiatal hernia with gastroesophageal reflux    History of blood transfusion    for Knee replacement   Hyperlipidemia, mild 04/02/2016   Hypertension    Hypothyroidism    Joint pain    Migraine aura without headache    Muscle pain    Muscle stiffness    Nasal congestion    Obesity 04/02/2016   Osteoarthritis    Parotiditis 04/21/2016   Pre-diabetes    Vitamin D  deficiency 04/02/2016   Past Surgical History:  Procedure Laterality Date   APPENDECTOMY     BREAST LUMPECTOMY WITH RADIOACTIVE SEED LOCALIZATION Left 01/21/2021   Procedure: LEFT BREAST LUMPECTOMY WITH RADIOACTIVE SEED LOCALIZATION;   Surgeon: Vernetta Berg, MD;  Location: [facility];  Service: General;  Laterality: Left;   cataract surgery  2008   CHOLECYSTECTOMY  2010   JOINT REPLACEMENT Left    2012   MASTOIDECTOMY     POSTERIOR CERVICAL FUSION/FORAMINOTOMY N/A 04/27/2017   Procedure: CERVICAL FIVE-SIX  OPEN REDUCTION OF FRACTURE, CERVICAL THREE-SEVEN  DORSAL FIXATION AND FUSION;  Surgeon: Ditty, Morene Hicks, MD;  Location: [facility] OR;  Service: Neurosurgery;  Laterality: N/A;   SHOULDER ARTHROSCOPY Bilateral prior to 2010   TONSILLECTOMY     TOTAL HIP ARTHROPLASTY Right 03/22/2015   Procedure: RIGHT TOTAL HIP ARTHROPLASTY ANTERIOR APPROACH;  Surgeon: Norleen Gavel, MD;  Location: [facility] OR;  Service: Orthopedics;  Laterality: Right;   TOTAL KNEE ARTHROPLASTY Bilateral 2007   TUBAL  LIGATION     Family History  Problem Relation Age of Onset   Dementia Mother    Hypertension Mother    Obesity Mother    Diabetes Mother    Heart disease Father    Hypertension Father    Stroke Father    Diabetes Father    Hyperlipidemia Father    Alcoholism Father    Obesity Father    Cancer Maternal Grandfather        colon cancer   Arthritis Daughter    Social History   Socioeconomic History   Marital status: Widowed    Spouse name: Not on file   Number of children: Not on file   Years of education: Not on file   Highest education level: Not on file  Occupational History   Occupation: Retired  Tobacco Use   Smoking status: Never   Smokeless tobacco: Never   Tobacco comments:    Stopped 50 years ago.   Vaping Use   Vaping status: Never Used  Substance and Sexual Activity   Alcohol use: Yes    Alcohol/week: 0.0 standard drinks of alcohol    Comment: socially 3-4x/wk   Drug use: No   Sexual activity: Not Currently  Other Topics Concern   Not on file  Social History Narrative   Lives at [facility], widowed 7 years, no dietary restrictions. Retired from neuropsychiatry work.    Social Drivers of  Corporate investment banker Strain: Low Risk  (06/15/2024)   Overall Financial Resource Strain (CARDIA)    Difficulty of Paying Living Expenses: Not hard at all  Food Insecurity: No Food Insecurity (06/15/2024)   Hunger Vital Sign    Worried About Running Out of Food in the Last Year: Never true    Ran Out of Food in the Last Year: Never true  Transportation Needs: No Transportation Needs (06/15/2024)   PRAPARE - Administrator, Civil Service (Medical): No    Lack of Transportation (Non-Medical): No  Physical Activity: Sufficiently Active (06/15/2024)   Exercise Vital Sign    Days of Exercise per Week: 3 days    Minutes of Exercise per Session: 60 min  Stress: No Stress Concern Present (06/15/2024)   [employer] of Occupational Health - Occupational Stress Questionnaire    Feeling of Stress: Not at all  Social Connections: Moderately Integrated (06/15/2024)   Social Connection and Isolation Panel    Frequency of Communication with Friends and Family: More than three times a week    Frequency of Social Gatherings with Friends and Family: More than three times a week    Attends Religious Services: More than 4 times per year    Active Member of [employer] or Organizations: Yes    Attends Banker Meetings: More than 4 times per year    Marital Status: Widowed    Tobacco Counseling Counseling given: Not Answered Tobacco comments: Stopped 50 years ago.     Clinical Intake:  Pre-visit preparation completed: Yes  Pain : No/denies pain     BMI - recorded: 32.28 Nutritional Status: BMI > 30  Obese Nutritional Risks: None Diabetes: No  Lab Results  Component Value Date   HGBA1C 6.6 (H) 04/10/2024   HGBA1C 5.9 (H) 12/27/2023   HGBA1C 6.1 10/14/2023     How often do you need to have someone help you when you read instructions, pamphlets, or other written materials from your doctor or pharmacy?: 1 - Never  Interpreter Needed?: No  Information entered  by :: Rojelio Blush LPN   Activities of Daily Living     06/15/2024    8:21 AM  In your present state of health, do you have any difficulty performing the following activities:  Hearing? 1  Comment Wears Hearing Aids  Vision? 0  Difficulty concentrating or making decisions? 0  Walking or climbing stairs? 1  Comment Uses a Cane  Dressing or bathing? 0  Doing errands, shopping? 0  Preparing Food and eating ? N  Using the Toilet? N  In the past six months, have you accidently leaked urine? Y  Comment Followed by medical attention  Do you have problems with loss of bowel control? N  Managing your Medications? N  Managing your Finances? N  Housekeeping or managing your Housekeeping? N    Patient Care Team: Domenica Harlene LABOR, MD as PCP - General (Family Medicine) Croitoru, Jerel, MD as PCP - Cardiology (Cardiology) Tyree Nanetta SAILOR, RN as Oncology Nurse Navigator Vernetta Berg, MD as Consulting Physician (General Surgery) Dewey Rush, MD as Consulting Physician (Radiation Oncology) Darol Norris, MD as Consulting Physician (Obstetrics and Gynecology) Darlean Ned, MD as Consulting Physician (Rehabilitation) Jewell, Jolene R, MD as Consulting Physician (Dermatology) Carla Milling, RPH-CPP (Pharmacist) Tobie Baptist, MD as Consulting Physician (Ophthalmology) Nancyann DOROTHA Ebbing, M.D., PA  I have updated your Care Teams any recent Medical Services you may have received from other providers in the past year.     Assessment:   This is a routine wellness examination for [place].  Hearing/Vision screen Hearing Screening - Comments:: Wears Hearing Aids Vision Screening - Comments:: Wears rx glasses - up to date with routine eye exams with  Dr Marcey   Goals Addressed               This Visit's Progress     Remain active (pt-stated)         Depression Screen     06/15/2024    8:20 AM 04/13/2024   11:08 AM 02/18/2023   11:26 AM 10/21/2022   11:28 AM 06/04/2022     3:49 PM 02/05/2022    9:09 AM 07/22/2021    3:49 PM  PHQ 2/9 Scores  PHQ - 2 Score 0 0 0 0 0 0 0  PHQ- 9 Score   0  0 0 0    Fall Risk     06/15/2024    8:22 AM 06/08/2023    2:16 PM 02/18/2023   11:26 AM 10/21/2022   11:31 AM 06/04/2022    3:46 PM  Fall Risk   Falls in the past year? 0 0 0 1 1  Number falls in past yr: 0 0 0 1 0  Injury with Fall? 0 0 0 1 1  Comment    broke 4 ribs   Risk for fall due to : No Fall Risks   History of fall(s)   Follow up Falls evaluation completed Falls evaluation completed Falls evaluation completed Falls prevention discussed Falls evaluation completed      Data saved with a previous flowsheet row definition    MEDICARE RISK AT HOME:  Medicare Risk at Home Any stairs in or around the home?: Yes If so, are there any without handrails?: No Home free of loose throw rugs in walkways, pet beds, electrical cords, etc?: Yes Adequate lighting in your home to reduce risk of falls?: Yes Life alert?: No Use of a cane, walker or w/c?: Yes Grab bars in the bathroom?: Yes Shower chair  or bench in shower?: No Elevated toilet seat or a handicapped toilet?: No  TIMED UP AND GO:  Was the test performed?  No  Cognitive Function: 6CIT completed    10/18/2017   11:18 AM  MMSE - Mini Mental State Exam  Orientation to time 5   Orientation to Place 5   Registration 3   Attention/ Calculation 5   Recall 3   Language- name 2 objects 2   Language- repeat 1  Language- follow 3 step command 3   Language- read & follow direction 1   Write a sentence 1   Copy design 1   Total score 30      Data saved with a previous flowsheet row definition        06/15/2024    8:22 AM 10/21/2022   11:33 AM  6CIT Screen  What Year? 0 points 0 points  What month? 0 points 0 points  What time? 0 points 0 points  Count back from 20 0 points 0 points  Months in reverse 0 points 0 points  Repeat phrase 0 points 0 points  Total Score 0 points 0 points     Immunizations Immunization History  Administered Date(s) Administered   Fluad Quad(high Dose 65+) 05/30/2019, 06/07/2020, 07/01/2021, 06/04/2022, 06/05/2023   Hepatitis A, Adult 02/17/2018   INFLUENZA, HIGH DOSE SEASONAL PF 06/14/2017, 05/27/2018, 05/29/2023   Influenza-Unspecified 06/12/2014, 06/24/2015   Moderna Covid-19 Vaccine  Bivalent Booster 55yrs & up 06/09/2021, 06/26/2021, 02/06/2022   Moderna SARS-COV2 Booster Vaccination 06/05/2023   Moderna Sars-Covid-2 Vaccination 09/26/2019, 10/24/2019, 07/11/2020   PFIZER Comirnaty (Gray Top)Covid-19 Tri-Sucrose Vaccine 01/06/2021   PNEUMOCOCCAL CONJUGATE-20 10/14/2023   Pfizer(Comirnaty )Fall Seasonal Vaccine 12 years and older 06/24/2022, 01/15/2023, 05/29/2023   Pneumococcal Conjugate-13 10/17/2013, 04/30/2015   Pneumococcal Polysaccharide-23 09/14/1998, 08/27/1999, 09/14/2008, 01/28/2018   Respiratory Syncytial Virus Vaccine ,Recomb Aduvanted(Arexvy ) 06/24/2022   Tdap 10/25/2023   Tetanus 10/17/2013   Yellow Fever 05/19/2012   Zoster Recombinant(Shingrix) 01/26/2017, 02/01/2017, 06/28/2017    Screening Tests Health Maintenance  Topic Date Due   FOOT EXAM  02/18/2024   Influenza Vaccine  04/14/2024   COVID-19 Vaccine (10 - Moderna risk 2024-25 season) 05/15/2024   HEMOGLOBIN A1C  10/11/2024   OPHTHALMOLOGY EXAM  06/02/2025   Medicare Annual Wellness (AWV)  06/15/2025   DTaP/Tdap/Td (2 - Td or Tdap) 10/24/2033   Pneumococcal Vaccine: 50+ Years  Completed   DEXA SCAN  Completed   Zoster Vaccines- Shingrix  Completed   HPV VACCINES  Aged Out   Meningococcal B Vaccine  Aged Out    Health Maintenance Items Addressed:   Additional Screening:  Vision Screening: Recommended annual ophthalmology exams for early detection of glaucoma and other disorders of the eye. Is the patient up to date with their annual eye exam?  Yes  Who is the provider or what is the name of the office in which the patient attends annual eye exams? Dr  Marcey  Dental Screening: Recommended annual dental exams for proper oral hygiene  Community Resource Referral / Chronic Care Management: CRR required this visit?  No   CCM required this visit?  No   Plan:    I have personally reviewed and noted the following in the patient's chart:   Medical and social history Use of alcohol, tobacco or illicit drugs  Current medications and supplements including opioid prescriptions. Patient is not currently taking opioid prescriptions. Functional ability and status Nutritional status Physical activity Advanced directives List of other physicians Hospitalizations, surgeries, and ER visits in  previous 12 months Vitals Screenings to include cognitive, depression, and falls Referrals and appointments  In addition, I have reviewed and discussed with patient certain preventive protocols, quality metrics, and best practice recommendations. A written personalized care plan for preventive services as well as general preventive health recommendations were provided to patient.   Rojelio LELON Blush, LPN   [id number]/[id number]   After Visit Summary: (MyChart) Due to this being a telephonic visit, the after visit summary with patients personalized plan was offered to patient via MyChart   Notes: Nothing significant to report at this time.

## 2024-06-19 ENCOUNTER — Ambulatory Visit: Admitting: Nurse Practitioner

## 2024-06-20 DIAGNOSIS — R2681 Unsteadiness on feet: Secondary | ICD-10-CM | POA: Diagnosis not present

## 2024-06-25 ENCOUNTER — Other Ambulatory Visit: Payer: Self-pay | Admitting: Family Medicine

## 2024-06-27 ENCOUNTER — Other Ambulatory Visit

## 2024-06-29 ENCOUNTER — Other Ambulatory Visit (INDEPENDENT_AMBULATORY_CARE_PROVIDER_SITE_OTHER)

## 2024-06-29 DIAGNOSIS — E538 Deficiency of other specified B group vitamins: Secondary | ICD-10-CM | POA: Diagnosis not present

## 2024-06-29 DIAGNOSIS — E1169 Type 2 diabetes mellitus with other specified complication: Secondary | ICD-10-CM | POA: Diagnosis not present

## 2024-06-29 DIAGNOSIS — E119 Type 2 diabetes mellitus without complications: Secondary | ICD-10-CM | POA: Diagnosis not present

## 2024-06-29 DIAGNOSIS — E038 Other specified hypothyroidism: Secondary | ICD-10-CM

## 2024-06-29 DIAGNOSIS — E785 Hyperlipidemia, unspecified: Secondary | ICD-10-CM | POA: Diagnosis not present

## 2024-06-29 DIAGNOSIS — E559 Vitamin D deficiency, unspecified: Secondary | ICD-10-CM

## 2024-06-29 DIAGNOSIS — E669 Obesity, unspecified: Secondary | ICD-10-CM | POA: Diagnosis not present

## 2024-06-29 DIAGNOSIS — I10 Essential (primary) hypertension: Secondary | ICD-10-CM | POA: Diagnosis not present

## 2024-06-29 LAB — VITAMIN B12: Vitamin B-12: 643 pg/mL (ref 211–911)

## 2024-06-29 LAB — LIPID PANEL
Cholesterol: 165 mg/dL (ref 0–200)
HDL: 68.6 mg/dL (ref >39.00)
LDL Cholesterol: 67 mg/dL (ref 0–99)
NonHDL: 96.88
Total CHOL/HDL Ratio: 2
Triglycerides: 148 mg/dL (ref 0.0–149.0)
VLDL: 29.6 mg/dL (ref 0.0–40.0)

## 2024-06-29 LAB — CBC WITH DIFFERENTIAL/PLATELET
Basophils Absolute: 0 K/uL (ref 0.0–0.1)
Basophils Relative: 0.7 % (ref 0.0–3.0)
Eosinophils Absolute: 0.1 K/uL (ref 0.0–0.7)
Eosinophils Relative: 2.8 % (ref 0.0–5.0)
HCT: 40.4 % (ref 36.0–46.0)
Hemoglobin: 13.5 g/dL (ref 12.0–15.0)
Lymphocytes Relative: 14.7 % (ref 12.0–46.0)
Lymphs Abs: 0.7 K/uL (ref 0.7–4.0)
MCHC: 33.5 g/dL (ref 30.0–36.0)
MCV: 93.1 fl (ref 78.0–100.0)
Monocytes Absolute: 0.6 K/uL (ref 0.1–1.0)
Monocytes Relative: 12.6 % — ABNORMAL HIGH (ref 3.0–12.0)
Neutro Abs: 3.4 K/uL (ref 1.4–7.7)
Neutrophils Relative %: 69.2 % (ref 43.0–77.0)
Platelets: 179 K/uL (ref 150.0–400.0)
RBC: 4.34 Mil/uL (ref 3.87–5.11)
RDW: 13.6 % (ref 11.5–15.5)
WBC: 4.9 K/uL (ref 4.0–10.5)

## 2024-06-29 LAB — COMPREHENSIVE METABOLIC PANEL WITH GFR
ALT: 16 U/L (ref 0–35)
AST: 22 U/L (ref 0–37)
Albumin: 4.4 g/dL (ref 3.5–5.2)
Alkaline Phosphatase: 101 U/L (ref 39–117)
BUN: 13 mg/dL (ref 6–23)
CO2: 29 meq/L (ref 19–32)
Calcium: 8.5 mg/dL (ref 8.4–10.5)
Chloride: 97 meq/L (ref 96–112)
Creatinine, Ser: 0.64 mg/dL (ref 0.40–1.20)
GFR: 77.83 mL/min (ref >60.00)
Glucose, Bld: 113 mg/dL — ABNORMAL HIGH (ref 70–99)
Potassium: 4.8 meq/L (ref 3.5–5.1)
Sodium: 133 meq/L — ABNORMAL LOW (ref 135–145)
Total Bilirubin: 0.5 mg/dL (ref 0.2–1.2)
Total Protein: 6.5 g/dL (ref 6.0–8.3)

## 2024-06-29 LAB — HEMOGLOBIN A1C: Hgb A1c MFr Bld: 6.1 % (ref 4.6–6.5)

## 2024-06-29 LAB — TSH: TSH: 10.8 u[IU]/mL — ABNORMAL HIGH (ref 0.35–5.50)

## 2024-06-29 LAB — VITAMIN D 25 HYDROXY (VIT D DEFICIENCY, FRACTURES): VITD: 31.42 ng/mL (ref 30.00–100.00)

## 2024-06-29 NOTE — Addendum Note (Signed)
 Addended by: TRUDY CURVIN RAMAN on: 06/29/2024 09:56 AM   Modules accepted: Orders

## 2024-06-30 ENCOUNTER — Ambulatory Visit: Payer: Self-pay | Admitting: Family Medicine

## 2024-07-03 ENCOUNTER — Ambulatory Visit: Admitting: Nurse Practitioner

## 2024-07-03 ENCOUNTER — Encounter: Payer: Self-pay | Admitting: Nurse Practitioner

## 2024-07-03 VITALS — BP 135/82 | HR 67 | Temp 97.5°F | Ht 65.0 in | Wt 198.0 lb

## 2024-07-03 DIAGNOSIS — E038 Other specified hypothyroidism: Secondary | ICD-10-CM

## 2024-07-03 DIAGNOSIS — E1169 Type 2 diabetes mellitus with other specified complication: Secondary | ICD-10-CM | POA: Diagnosis not present

## 2024-07-03 DIAGNOSIS — Z6832 Body mass index (BMI) 32.0-32.9, adult: Secondary | ICD-10-CM

## 2024-07-03 DIAGNOSIS — E669 Obesity, unspecified: Secondary | ICD-10-CM

## 2024-07-03 DIAGNOSIS — E66811 Obesity, class 1: Secondary | ICD-10-CM

## 2024-07-03 MED ORDER — LEVOTHYROXINE SODIUM 150 MCG PO TABS: 150.0000 ug | ORAL_TABLET | Freq: Every day | ORAL | 1 refills | Status: DC

## 2024-07-03 NOTE — Progress Notes (Signed)
 Office: [phone]  /  Fax: [phone]  WEIGHT SUMMARY AND BIOMETRICS  Weight Lost Since Last Visit: 0lb  Weight Gained Since Last Visit: 4lb   Vitals Temp: (!) 97.5 F (36.4 C) BP: 135/82 Pulse Rate: 67 SpO2: 96 %   Anthropometric Measurements Height: 5' 5 (1.651 m) Weight: 198 lb (89.8 kg) BMI (Calculated): 32.95 Weight at Last Visit: 194lb Weight Lost Since Last Visit: 0lb Weight Gained Since Last Visit: 4lb Starting Weight: 211lb Total Weight Loss (lbs): 13 lb (5.897 kg)   Body Composition  Body Fat %: 46.3 % Fat Mass (lbs): 91.8 lbs Muscle Mass (lbs): 101.2 lbs Total Body Water (lbs): 72.6 lbs Visceral Fat Rating : 16   Other Clinical Data Fasting: No Labs: No Today's Visit #: 19 Starting Date: 10/26/22     HPI  Chief Complaint: OBESITY  Catherine Coleman is here to discuss her progress with her obesity treatment plan. She is on the the Category 2 Plan and states she is following her eating plan approximately 0 % of the time. She states she is exercising 0 minutes 0 days per week.   Interval History:  Since last office visit she has gained 4 pounds.  She went on vacation since her last visit.  She notes she has been eating more and the wrong stuff.  She is limiting her water intake due to my bladder issues.  She is drinking 2 glasses of wine nightly and coffee daily. She is walking in the pool for 1 hour 3 days per week.    Her highest weight was 224 lbs.    Pharmacotherapy for weight loss: She is not currently taking medications for medical weight loss.  Pharmacotherapy for DMT2:   She is not currently taking any medications for DMT2.   Last A1c was 6.1 on 06/29/24-improved She is not checking BS at home.   Episodes of hypoglycemia: Denies On ACE and statin  Last eye exam:  scheduled in October -She has tried Ozempic  and Metformin  in the past.  -She stopped Ozempic  due to side effects.  -She retried taking Metformin  and stopped due to side  effects of GI upset.    She saw RD on 06/06/24 and doesn't have a follow up appt scheduled.    Lab Results  Component Value Date   HGBA1C 6.1 06/29/2024   HGBA1C 6.6 (H) 04/10/2024   HGBA1C 5.9 (H) 12/27/2023   Lab Results  Component Value Date   LDLCALC 67 06/29/2024   CREATININE 0.64 06/29/2024      Hypothyroidism Notes fatigue.  Medication(s): Levothyroxine  (unsure of current dose-she notes she ran out of the higher dose and started taking a lower dose she had at home).  Dr. Domenica sent her a message on 06/30/24 concerning her results and levo dose.    Lab Results  Component Value Date   TSH 10.80 (H) 06/29/2024     PHYSICAL EXAM:  Blood pressure 135/82, pulse 67, temperature (!) 97.5 F (36.4 C), height 5' 5 (1.651 m), weight 198 lb (89.8 kg), SpO2 96%. Body mass index is 32.95 kg/m.  General: She is overweight, cooperative, alert, well developed, and in no acute distress. PSYCH: Has normal mood, affect and thought process.   Extremities: No edema.  Neurologic: No gross sensory or motor deficits. No tremors or fasciculations noted.    DIAGNOSTIC DATA REVIEWED:  BMET    Component Value Date/Time   NA 133 (L) 06/29/2024 0836   NA 136 12/27/2023 1042   K 4.8 06/29/2024  0836   CL 97 06/29/2024 0836   CO2 29 06/29/2024 0836   GLUCOSE 113 (H) 06/29/2024 0836   BUN 13 06/29/2024 0836   BUN 13 12/27/2023 1042   CREATININE 0.64 06/29/2024 0836   CREATININE 0.76 01/02/2022 1331   CALCIUM  8.5 06/29/2024 0836   GFRNONAA >60 03/02/2022 1307   GFRNONAA >60 01/02/2022 1331   GFRAA 94 09/28/2018 1147   Lab Results  Component Value Date   HGBA1C 6.1 06/29/2024   HGBA1C 6.4 10/18/2017   Lab Results  Component Value Date   INSULIN  7.9 10/26/2022   INSULIN  15.5 05/30/2018   Lab Results  Component Value Date   TSH 10.80 (H) 06/29/2024   CBC    Component Value Date/Time   WBC 4.9 06/29/2024 0836   RBC 4.34 06/29/2024 0836   HGB 13.5 06/29/2024 0836   HGB 13.1  12/27/2023 1042   HCT 40.4 06/29/2024 0836   HCT 39.6 12/27/2023 1042   PLT 179.0 06/29/2024 0836   PLT 200 12/27/2023 1042   MCV 93.1 06/29/2024 0836   MCV 99 (H) 12/27/2023 1042   MCH 32.7 12/27/2023 1042   MCH 31.9 08/12/2023 0829   MCHC 33.5 06/29/2024 0836   RDW 13.6 06/29/2024 0836   RDW 13.1 12/27/2023 1042   Iron Studies No results found for: IRON, TIBC, FERRITIN, IRONPCTSAT Lipid Panel     Component Value Date/Time   CHOL 165 06/29/2024 0836   CHOL 226 (H) 09/28/2018 1147   TRIG 148.0 06/29/2024 0836   HDL 68.60 06/29/2024 0836   HDL 60 09/28/2018 1147   CHOLHDL 2 06/29/2024 0836   VLDL 29.6 06/29/2024 0836   LDLCALC 67 06/29/2024 0836   LDLCALC 134 (H) 09/28/2018 1147   LDLDIRECT 138.0 05/15/2019 1022   Hepatic Function Panel     Component Value Date/Time   PROT 6.5 06/29/2024 0836   PROT 6.5 12/27/2023 1042   ALBUMIN 4.4 06/29/2024 0836   ALBUMIN 4.4 12/27/2023 1042   AST 22 06/29/2024 0836   AST 20 01/02/2022 1331   ALT 16 06/29/2024 0836   ALT 19 01/02/2022 1331   ALKPHOS 101 06/29/2024 0836   BILITOT 0.5 06/29/2024 0836   BILITOT 0.3 12/27/2023 1042   BILITOT 0.3 01/02/2022 1331      Component Value Date/Time   TSH 10.80 (H) 06/29/2024 0836   Nutritional Lab Results  Component Value Date   VD25OH 31.42 06/29/2024   VD25OH 39.13 04/10/2024   VD25OH 34.71 10/14/2023     ASSESSMENT AND PLAN  TREATMENT PLAN FOR OBESITY:  Recommended Dietary Goals  Catherine Coleman is currently in the action stage of change. As such, her goal is to continue weight management plan. She has agreed to keeping a food journal and adhering to recommended goals of 1300-1400 calories and 70 grams of protein.  Behavioral Intervention  We discussed the following Behavioral Modification Strategies today: increasing lean protein intake to established goals, decreasing simple carbohydrates , increasing vegetables, increasing fiber rich foods, increasing water intake ,  reading food labels , keeping healthy foods at home, identifying sources and decreasing liquid calories, continue to work on maintaining a reduced calorie state, getting the recommended amount of protein, incorporating whole foods, making healthy choices, staying well hydrated and practicing mindfulness when eating., and increase protein intake, fibrous foods (25 grams per day for women, 30 grams for men) and water to improve satiety and decrease hunger signals. .  Additional resources provided today: NA  Recommended Physical Activity Goals  Allisson has  been advised to work up to 150 minutes of moderate intensity aerobic activity a week and strengthening exercises 2-3 times per week for cardiovascular health, weight loss maintenance and preservation of muscle mass.   She has agreed to Think about enjoyable ways to increase daily physical activity and overcoming barriers to exercise, Increase physical activity in their day and reduce sedentary time (increase NEAT)., and Combine aerobic and strengthening exercises for efficiency and improved cardiometabolic health.    ASSOCIATED CONDITIONS ADDRESSED TODAY  Action/Plan  Type 2 diabetes mellitus in patient with obesity (HCC) Last A1c improved  Good blood sugar control is important to decrease the likelihood of diabetic complications such as nephropathy, neuropathy, limb loss, blindness, coronary artery disease, and death. Intensive lifestyle modification including diet, exercise and weight loss are the first line of treatment for diabetes.    Other specified hypothyroidism I contacted Dr. Elisabeth office to let them know that Mrs. Kreider hasn't started levo 150mg  since the new dose hasn't been prescribed yet.  Also to let them know that she is unsure of what dose she is currently taking.  She has an appt scheduled to see them on 07/11/24. She needs to let them know what dose she has been taking so they can adjust her medication accordingly.     Class 1 obesity due to excess calories with body mass index (BMI) of 32.0 to 32.9 in adult, unspecified whether serious comorbidity present         Return in about 6 weeks (around 08/14/2024).SABRA She was informed of the importance of frequent follow up visits to maximize her success with intensive lifestyle modifications for her multiple health conditions.   ATTESTASTION STATEMENTS:  Reviewed by clinician on day of visit: allergies, medications, problem list, medical history, surgical history, family history, social history, and previous encounter notes.   I personally spent a total of 39 minutes in the care of the patient today including preparing to see the patient, getting/reviewing separately obtained history, performing a medically appropriate exam/evaluation, counseling and educating, referring and communicating with other health care professionals, documenting clinical information in the EHR, communicating results, and coordinating care.    Corean SAUNDERS. Delmar Arriaga FNP-C

## 2024-07-03 NOTE — Telephone Encounter (Signed)
 Pt was at [facility] and message was sent by provider there that rx was never sent in from 10/16/225.  Rx sent in and pt has appt on 10/28.

## 2024-07-04 ENCOUNTER — Ambulatory Visit: Admitting: Student

## 2024-07-06 ENCOUNTER — Ambulatory Visit: Admitting: Student

## 2024-07-07 NOTE — Assessment & Plan Note (Signed)
 Follows with ophthalmology

## 2024-07-07 NOTE — Assessment & Plan Note (Addendum)
 Well controlled, no changes to meds. Encouraged heart healthy diet such as the DASH diet and exercise as tolerated.    -Consider discontinuing second dose if blood pressure remains stable.

## 2024-07-07 NOTE — Assessment & Plan Note (Addendum)
-   Increase vitamin D  to 2000 IU daily. - Recheck vitamin D  level in January.

## 2024-07-07 NOTE — Assessment & Plan Note (Addendum)
 Improvement with Myrbetriq  but incomplete resolution. Stress incontinence still an issue, but improved with PT. - Continue Myrbetriq . - Encourage pelvic floor exercises and Kegels. - Consider pelvic floor stimulation if symptoms worsen. -Follows with UroGYN

## 2024-07-07 NOTE — Assessment & Plan Note (Addendum)
 On levothyroxine .  Recently increased medication to 150 mcg.  Continue to supplement and monitor.  Repeat TSH in 3 months approximately around January 2025. - Pt reports she has been taking her thyroid  medication with other medications in the morning, advise to take thyroid  medication on empty stomach, 45 minutes- 1 hour before other medications or food. Lab Results  Component Value Date   TSH 10.80 (H) 06/29/2024

## 2024-07-07 NOTE — Assessment & Plan Note (Signed)
 hgba1c acceptable, minimize simple carbs. Increase exercise as tolerated.

## 2024-07-07 NOTE — Progress Notes (Signed)
 Subjective:     Patient ID: Catherine Coleman, female    DOB: Aug 26, 1932, 89 y.o.   MRN: [id number]  No chief complaint on file.   HPI  Discussed the use of AI scribe software for clinical note transcription with the patient, who gave verbal consent to proceed.   History of Present Illness Catherine Coleman is a 89 year old female with hypertension and aortic aneurysm who presents for a follow-up visit.  She struggles with adherence to her carvedilol  regimen, often missing the 2nd dose. She does not monitor her blood pressure at home. She follows up with cardiology annually, with the last visit in May. S  She experiences urinary frequency, particularly when standing, and has been undergoing treatment for urogynecological issues. She recently completed physical therapy, which provided some relief. She takes Myrbetriq  with some benefit.  She takes omeprazole  40 mg  daily, which began after using Ozempic , a medication she discontinued due to gastrointestinal side effects. She aims to lose 10-15 pounds and has been involved with [facility]. She has regained some weight after previous loss.   She has been taking her thyroid  medications with other medications in the morning, last thyroid  lab was high.  Past medical history-type II DM, history of breast cancer, OAB, age-related macular degeneration  Follows with: Uro-GYN, oncology, cardiology [facility]  History of breast cancer-follows with oncology On anastrozole   HTN Carvedilol  3.125 mg twice daily, losartan  50 mg daily Not taking BP at home  HLD-rosuvastatin  5 mg daily  GERD-omeprazole  40 mg daily ;Pepcid  40 mg as needed  Hypothyroidism Synthroid  150 mcg daily  OAB-Myrbetriq  50 mg daily-follows with uro-GYN  Vitamin D  deficiency-1000 units vitamin D  daily  Patient denies fever, chills, SOB, CP, palpitations, dyspnea, edema, HA, vision changes, N/V/D, abdominal pain, urinary symptoms,  rash, weight changes, and recent illness or hospitalizations.     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   Constipation    Decreased hearing    Diabetes (HCC)    Diverticulitis    Dry eyes    Fatigue    Floaters in visual field    Gall bladder disease    GERD (gastroesophageal reflux disease)    Glaucoma    Hiatal hernia with gastroesophageal reflux    History of blood transfusion    for Knee replacement   Hyperlipidemia, mild 04/02/2016   Hypertension    Hypothyroidism    Joint pain    Migraine aura without headache    Muscle pain    Muscle stiffness    Nasal congestion    Obesity 04/02/2016   Osteoarthritis    Parotiditis 04/21/2016   Pre-diabetes    Vitamin D  deficiency 04/02/2016    Past Surgical History:  Procedure Laterality Date   APPENDECTOMY     BREAST LUMPECTOMY WITH RADIOACTIVE SEED LOCALIZATION Left 01/21/2021   Procedure: LEFT BREAST LUMPECTOMY WITH RADIOACTIVE SEED LOCALIZATION;  Surgeon: Vernetta Berg, MD;  Location: [facility];  Service: General;  Laterality: Left;   cataract surgery  2008   CHOLECYSTECTOMY  2010   JOINT REPLACEMENT Left    2012   MASTOIDECTOMY     POSTERIOR CERVICAL FUSION/FORAMINOTOMY N/A 04/27/2017   Procedure: CERVICAL FIVE-SIX  OPEN REDUCTION OF FRACTURE, CERVICAL THREE-SEVEN  DORSAL FIXATION AND FUSION;  Surgeon: Ditty, Morene Hicks, MD;  Location: [facility] OR;  Service: Neurosurgery;  Laterality: N/A;   SHOULDER ARTHROSCOPY Bilateral prior to 2010   TONSILLECTOMY     TOTAL HIP ARTHROPLASTY Right 03/22/2015   Procedure: RIGHT TOTAL HIP ARTHROPLASTY ANTERIOR APPROACH;  Surgeon: Norleen Gavel, MD;  Location: [facility] OR;   Service: Orthopedics;  Laterality: Right;   TOTAL KNEE ARTHROPLASTY Bilateral 2007   TUBAL LIGATION      Family History  Problem Relation Age of Onset   Dementia Mother    Hypertension Mother    Obesity Mother    Diabetes Mother    Heart disease Father    Hypertension Father    Stroke Father    Diabetes Father    Hyperlipidemia Father    Alcoholism Father    Obesity Father    Cancer Maternal Grandfather        colon cancer   Arthritis Daughter     Social History   Socioeconomic History   Marital status: Widowed    Spouse name: Not on file   Number of children: Not on file   Years of education: Not on file   Highest education level: Not on file  Occupational History   Occupation: Retired  Tobacco Use   Smoking status: Never   Smokeless tobacco: Never   Tobacco comments:    Stopped 50 years ago.   Vaping Use   Vaping status: Never Used  Substance and Sexual Activity   Alcohol use: Yes    Alcohol/week: 0.0 standard drinks of alcohol    Comment: socially 3-4x/wk   Drug use: No   Sexual activity: Not Currently  Other Topics Concern   Not on file  Social History Narrative   Lives at [facility], widowed 7 years, no dietary restrictions. Retired from neuropsychiatry work.    Social Drivers of Corporate Investment Banker Strain: Low Risk  (06/15/2024)   Overall Financial Resource Strain (CARDIA)    Difficulty of Paying Living Expenses: Not hard at all  Food Insecurity: No Food Insecurity (06/15/2024)   Hunger Vital Sign    Worried About Running Out of Food in the Last Year: Never true    Ran Out of Food in the Last Year: Never true  Transportation Needs: No Transportation Needs (06/15/2024)   PRAPARE - Administrator, Civil Service (Medical): No    Lack of Transportation (Non-Medical): No  Physical Activity: Sufficiently Active (06/15/2024)   Exercise Vital Sign    Days of Exercise per Week: 3 days    Minutes of Exercise per Session: 60 min   Stress: No Stress Concern Present (06/15/2024)   [employer] of Occupational Health - Occupational Stress Questionnaire    Feeling of Stress: Not at all  Social Connections: Moderately Integrated (06/15/2024)   Social Connection and Isolation Panel    Frequency of Communication with Friends and Family: More than three times a week    Frequency of Social Gatherings with Friends and Family: More than three times a week    Attends Religious Services: More than 4 times per year    Active Member of [employer] or Organizations: Yes    Attends Banker Meetings: More than 4 times per year    Marital Status:  Widowed  Intimate Partner Violence: Not At Risk (06/15/2024)   Humiliation, Afraid, Rape, and Kick questionnaire    Fear of Current or Ex-Partner: No    Emotionally Abused: No    Physically Abused: No    Sexually Abused: No    Outpatient Medications Prior to Visit  Medication Sig Dispense Refill   anastrozole  (ARIMIDEX ) 1 MG tablet TAKE 1 TABLET BY MOUTH DAILY 100 tablet 2   brimonidine  (ALPHAGAN ) 0.2 % ophthalmic solution Place 1 drop into both eyes 2 (two) times daily. 5 mL 12   Calcium  Carb-Cholecalciferol 600-10 MG-MCG TABS Take 1 tablet by mouth daily.     carvedilol  (COREG ) 3.125 MG tablet Take 1 tablet (3.125 mg total) by mouth 2 (two) times daily with a meal. 60 tablet 3   Cholecalciferol (VITAMIN D3) 50 MCG (2000 UT) CAPS Take 1 capsule by mouth daily.     estradiol  (ESTRACE ) 0.1 MG/GM vaginal cream Place 0.5 g vaginally 2 (two) times a week. Place 0.5g nightly for two weeks then twice a week after 42 g 11   famotidine  (PEPCID ) 40 MG tablet TAKE 1 TABLET BY MOUTH AT  BEDTIME AS NEEDED FOR HEARTBURN  OR INDIGESTION (Patient taking differently: Take 40 mg by mouth at bedtime.) 100 tablet 2   fluticasone  (FLONASE ) 50 MCG/ACT nasal spray Place 2 sprays into both nostrils daily. 16 g 6   latanoprost (XALATAN) 0.005 % ophthalmic solution Place 1 drop into both eyes at bedtime.      levothyroxine  (SYNTHROID ) 150 MCG tablet Take 1 tablet (150 mcg total) by mouth daily. 90 tablet 1   losartan  (COZAAR ) 50 MG tablet TAKE 1 TABLET BY MOUTH DAILY 100 tablet 2   MAGNESIUM -POTASSIUM PO Take 1 tablet by mouth daily.     Multiple Vitamins-Minerals (PRESERVISION AREDS PO) Take 1 tablet by mouth daily.     MYRBETRIQ  50 MG TB24 tablet Take 50 mg by mouth daily.     rosuvastatin  (CRESTOR ) 5 MG tablet TAKE 1 TABLET BY MOUTH DAILY 100 tablet 2   Blood Glucose Monitoring Suppl DEVI 1 each by Does not apply route in the morning, at noon, and at bedtime. May substitute to any manufacturer covered by patient's insurance. 1 each 0   omeprazole  (PRILOSEC) 40 MG capsule TAKE 1 CAPSULE BY MOUTH DAILY 100 capsule 2   No facility-administered medications prior to visit.    No Known Allergies  ROS See HPI    Objective:    Physical Exam  General: No acute distress. Awake and conversant.  Eyes: Normal conjunctiva, anicteric. Round symmetric pupils.  ENT: Hearing grossly intact. No nasal discharge.  Neck: Neck is supple. No masses or thyromegaly.  Respiratory: CTAB. Respirations are non-labored. No wheezing.  Skin: Warm. No rashes or ulcers.  Psych: Alert and oriented. Cooperative, Appropriate mood and affect, Normal judgment.  CV: RRR. No murmur. No lower extremity edema.  MSK:. No clubbing or cyanosis.  Neuro:  CN II-XII grossly normal.    BP 131/77   Pulse 61   Ht 5' 5 (1.651 m)   Wt 202 lb 9.6 oz (91.9 kg)   SpO2 95%   BMI 33.71 kg/m  Wt Readings from Last 3 Encounters:  07/11/24 202 lb 9.6 oz (91.9 kg)  07/03/24 198 lb (89.8 kg)  06/15/24 194 lb (88 kg)       Assessment & Plan:   Problem List Items Addressed This Visit     AMD (age-related macular degeneration), wet (HCC)   Follows with ophthalmology.  Hiatal hernia with gastroesophageal reflux   Avoid offending foods and start probiotics.  On omeprazole  40 mg daily and famotidine  as needed.  Advise not  eating large meals in the evening and raising head of bed.      Relevant Medications   omeprazole  (PRILOSEC) 20 MG capsule   Hyperlipidemia associated with type 2 diabetes mellitus (HCC)   Tolerating statin. Encourage heart healthy diet such as MIND or DASH diet, increase exercise, avoid trans fats, simple carbohydrates and processed foods, consider a krill or fish or flaxseed oil cap daily.        Relevant Orders   Lipid panel   Hypertension   Well controlled, no changes to meds. Encouraged heart healthy diet such as the DASH diet and exercise as tolerated.    -Consider discontinuing second dose if blood pressure remains stable.       Relevant Orders   CBC with Differential/Platelet   Hypothyroidism - Primary   On levothyroxine .  Recently increased medication to 150 mcg.  Continue to supplement and monitor.  Repeat TSH in 3 months approximately around January 2025. - Pt reports she has been taking her thyroid  medication with other medications in the morning, advise to take thyroid  medication on empty stomach, 45 minutes- 1 hour before other medications or food. Lab Results  Component Value Date   TSH 10.80 (H) 06/29/2024         Relevant Orders   TSH   Overactive bladder   Improvement with Myrbetriq  but incomplete resolution. Stress incontinence still an issue, but improved with PT. - Continue Myrbetriq . - Encourage pelvic floor exercises and Kegels. - Consider pelvic floor stimulation if symptoms worsen. -Follows with UroGYN      Type 2 diabetes mellitus in patient with obesity (HCC)   hgba1c acceptable, minimize simple carbs. Increase exercise as tolerated.       Relevant Orders   Comprehensive metabolic panel with GFR   HgB J8r   Vitamin B 12 deficiency   Supplement monitor      Vitamin D  deficiency   - Increase vitamin D  to 2000 IU daily. - Recheck vitamin D  level in January.      Relevant Orders   Vitamin D  (25 hydroxy)    Gastroesophageal reflux  disease On omeprazole  40 mg due to previous Ozempic  use per Pt. Interested in tapering due to osteoporosis concerns. Discussed potential interference with thyroid  medication absorption. - Decrease omeprazole  to 20 mg daily, Rx sent sent - Monitor for heartburn symptoms. - Consider discontinuing if symptoms controlled on lower dose.  Weight management Desires weight loss. Discontinued Ozempic  due to stomach upset.  - Continue with [facility] program. - Monitor thyroid  function.  FU in 3 months   I have discontinued Ashiyah Coor's omeprazole  and Blood Glucose Monitoring Suppl. I am also having her start on omeprazole . Additionally, I am having her maintain her brimonidine , vitamin D3, MAGNESIUM -POTASSIUM PO, Calcium  Carb-Cholecalciferol, Multiple Vitamins-Minerals (PRESERVISION AREDS PO), famotidine , latanoprost, fluticasone , Myrbetriq , carvedilol , rosuvastatin , anastrozole , estradiol , losartan , and levothyroxine .  Meds ordered this encounter  Medications   omeprazole  (PRILOSEC) 20 MG capsule    Sig: Take 1 capsule (20 mg total) by mouth daily.    Dispense:  90 capsule    Refill:  1    Supervising Provider:   DOMENICA BLACKBIRD A [[id number]]

## 2024-07-07 NOTE — Assessment & Plan Note (Signed)
 Supplement monitor.

## 2024-07-07 NOTE — Assessment & Plan Note (Signed)
 Avoid offending foods and start probiotics.  On omeprazole  40 mg daily and famotidine  as needed.  Advise not eating large meals in the evening and raising head of bed.

## 2024-07-07 NOTE — Assessment & Plan Note (Signed)
 Tolerating statin. Encourage heart healthy diet such as MIND or DASH diet, increase exercise, avoid trans fats, simple carbohydrates and processed foods, consider a krill or fish or flaxseed oil cap daily.

## 2024-07-10 ENCOUNTER — Ambulatory Visit: Admitting: Obstetrics and Gynecology

## 2024-07-11 ENCOUNTER — Encounter: Payer: Self-pay | Admitting: Student

## 2024-07-11 ENCOUNTER — Ambulatory Visit (INDEPENDENT_AMBULATORY_CARE_PROVIDER_SITE_OTHER): Admitting: Student

## 2024-07-11 VITALS — BP 131/77 | HR 61 | Ht 65.0 in | Wt 202.6 lb

## 2024-07-11 DIAGNOSIS — E559 Vitamin D deficiency, unspecified: Secondary | ICD-10-CM

## 2024-07-11 DIAGNOSIS — K449 Diaphragmatic hernia without obstruction or gangrene: Secondary | ICD-10-CM | POA: Diagnosis not present

## 2024-07-11 DIAGNOSIS — E1169 Type 2 diabetes mellitus with other specified complication: Secondary | ICD-10-CM | POA: Diagnosis not present

## 2024-07-11 DIAGNOSIS — H353290 Exudative age-related macular degeneration, unspecified eye, stage unspecified: Secondary | ICD-10-CM | POA: Diagnosis not present

## 2024-07-11 DIAGNOSIS — I10 Essential (primary) hypertension: Secondary | ICD-10-CM

## 2024-07-11 DIAGNOSIS — E038 Other specified hypothyroidism: Secondary | ICD-10-CM

## 2024-07-11 DIAGNOSIS — E538 Deficiency of other specified B group vitamins: Secondary | ICD-10-CM | POA: Diagnosis not present

## 2024-07-11 DIAGNOSIS — E785 Hyperlipidemia, unspecified: Secondary | ICD-10-CM | POA: Diagnosis not present

## 2024-07-11 DIAGNOSIS — E119 Type 2 diabetes mellitus without complications: Secondary | ICD-10-CM

## 2024-07-11 DIAGNOSIS — N3281 Overactive bladder: Secondary | ICD-10-CM | POA: Diagnosis not present

## 2024-07-11 DIAGNOSIS — K219 Gastro-esophageal reflux disease without esophagitis: Secondary | ICD-10-CM

## 2024-07-11 DIAGNOSIS — E669 Obesity, unspecified: Secondary | ICD-10-CM

## 2024-07-11 MED ORDER — OMEPRAZOLE 20 MG PO CPDR: 20.0000 mg | DELAYED_RELEASE_CAPSULE | Freq: Every day | ORAL | 1 refills | Status: DC

## 2024-08-14 ENCOUNTER — Ambulatory Visit: Admitting: Nurse Practitioner

## 2024-08-14 ENCOUNTER — Encounter: Payer: Self-pay | Admitting: Nurse Practitioner

## 2024-08-14 VITALS — BP 132/83 | HR 66 | Temp 97.6°F | Ht 65.0 in | Wt 197.0 lb

## 2024-08-14 DIAGNOSIS — E66811 Obesity, class 1: Secondary | ICD-10-CM

## 2024-08-14 DIAGNOSIS — E039 Hypothyroidism, unspecified: Secondary | ICD-10-CM | POA: Diagnosis not present

## 2024-08-14 DIAGNOSIS — E1169 Type 2 diabetes mellitus with other specified complication: Secondary | ICD-10-CM | POA: Diagnosis not present

## 2024-08-14 DIAGNOSIS — Z6832 Body mass index (BMI) 32.0-32.9, adult: Secondary | ICD-10-CM | POA: Diagnosis not present

## 2024-08-14 DIAGNOSIS — E6609 Other obesity due to excess calories: Secondary | ICD-10-CM

## 2024-08-14 DIAGNOSIS — E669 Obesity, unspecified: Secondary | ICD-10-CM

## 2024-08-14 NOTE — Progress Notes (Signed)
 Office: [phone]  /  Fax: [phone]  WEIGHT SUMMARY AND BIOMETRICS  Weight Lost Since Last Visit: 1lb  Weight Gained Since Last Visit: 0lb   Vitals Temp: 97.6 F (36.4 C) BP: 132/83 Pulse Rate: 66 SpO2: 96 %   Anthropometric Measurements Height: 5' 5 (1.651 m) Weight: 197 lb (89.4 kg) BMI (Calculated): 32.78 Weight at Last Visit: 198lb Weight Lost Since Last Visit: 1lb Weight Gained Since Last Visit: 0lb Starting Weight: 211lb Total Weight Loss (lbs): 14 lb (6.35 kg)   Body Composition  Body Fat %: 47.3 % Fat Mass (lbs): 93.4 lbs Muscle Mass (lbs): 98.8 lbs Total Body Water (lbs): 73 lbs Visceral Fat Rating : 16   Other Clinical Data Fasting: No Labs: No Today's Visit #: 20 Starting Date: 10/26/22     HPI  Chief Complaint: OBESITY  Catherine Coleman is here to discuss her progress with her obesity treatment plan. She is on the the Category 2 Plan and states she is following her eating plan approximately 0 % of the time. She states she is exercising 45-60 minutes 3 days per week.   Interval History:  Since last office visit she has lost 1 pound.  She is not following any meal plan.  She basically eats what she wants, when she wants.  She has gone to multiple celebrations and has several more for Christmas.  She is drinking water daily.  She is drinking 3 glasses of wine daily.  She is going to the pool and walking in the water 3 days per week. She is walking with the aid of a cane.   Her highest weight was 224 lbs.    Pharmacotherapy for weight loss: She is not currently taking medications for medical weight loss.  Pharmacotherapy for DMT2:   She is not currently taking any medications for DMT2.   Last A1c was 6.1 on 06/29/24-improved She is not checking BS at home.   Episodes of hypoglycemia: None On ACE and statin  -She has tried Ozempic  and Metformin  in the past.  -She stopped Ozempic  due to side effects.  -She retried taking Metformin  and  stopped due to side effects of GI upset.     Lab Results  Component Value Date   HGBA1C 6.1 06/29/2024   HGBA1C 6.6 (H) 04/10/2024   HGBA1C 5.9 (H) 12/27/2023   Lab Results  Component Value Date   LDLCALC 67 06/29/2024   CREATININE 0.64 06/29/2024    Hypothyroidism Stable.  Does not report symptoms associated with uncontrolled hypothyroidism. Medication(s): Levothyroxine  150 mcg daily.  She started taking her medication between 1am-4am over the past month.  She is not taking it with any other medications or food. She reports her fatigue has improved and she is overall feeling better.   Lab Results  Component Value Date   TSH 10.80 (H) 06/29/2024       PHYSICAL EXAM:  Blood pressure 132/83, pulse 66, temperature 97.6 F (36.4 C), height 5' 5 (1.651 m), weight 197 lb (89.4 kg), SpO2 96%. Body mass index is 32.78 kg/m.  General: She is overweight, cooperative, alert, well developed, and in no acute distress. PSYCH: Has normal mood, affect and thought process.   Extremities: No edema.  Neurologic: No gross sensory or motor deficits. No tremors or fasciculations noted.    DIAGNOSTIC DATA REVIEWED:  BMET    Component Value Date/Time   NA 133 (L) 06/29/2024 0836   NA 136 12/27/2023 1042   K 4.8 06/29/2024 0836   CL  97 06/29/2024 0836   CO2 29 06/29/2024 0836   GLUCOSE 113 (H) 06/29/2024 0836   BUN 13 06/29/2024 0836   BUN 13 12/27/2023 1042   CREATININE 0.64 06/29/2024 0836   CREATININE 0.76 01/02/2022 1331   CALCIUM  8.5 06/29/2024 0836   GFRNONAA >60 03/02/2022 1307   GFRNONAA >60 01/02/2022 1331   GFRAA 94 09/28/2018 1147   Lab Results  Component Value Date   HGBA1C 6.1 06/29/2024   HGBA1C 6.4 10/18/2017   Lab Results  Component Value Date   INSULIN  7.9 10/26/2022   INSULIN  15.5 05/30/2018   Lab Results  Component Value Date   TSH 10.80 (H) 06/29/2024   CBC    Component Value Date/Time   WBC 4.9 06/29/2024 0836   RBC 4.34 06/29/2024 0836   HGB  13.5 06/29/2024 0836   HGB 13.1 12/27/2023 1042   HCT 40.4 06/29/2024 0836   HCT 39.6 12/27/2023 1042   PLT 179.0 06/29/2024 0836   PLT 200 12/27/2023 1042   MCV 93.1 06/29/2024 0836   MCV 99 (H) 12/27/2023 1042   MCH 32.7 12/27/2023 1042   MCH 31.9 08/12/2023 0829   MCHC 33.5 06/29/2024 0836   RDW 13.6 06/29/2024 0836   RDW 13.1 12/27/2023 1042   Iron Studies No results found for: IRON, TIBC, FERRITIN, IRONPCTSAT Lipid Panel     Component Value Date/Time   CHOL 165 06/29/2024 0836   CHOL 226 (H) 09/28/2018 1147   TRIG 148.0 06/29/2024 0836   HDL 68.60 06/29/2024 0836   HDL 60 09/28/2018 1147   CHOLHDL 2 06/29/2024 0836   VLDL 29.6 06/29/2024 0836   LDLCALC 67 06/29/2024 0836   LDLCALC 134 (H) 09/28/2018 1147   LDLDIRECT 138.0 05/15/2019 1022   Hepatic Function Panel     Component Value Date/Time   PROT 6.5 06/29/2024 0836   PROT 6.5 12/27/2023 1042   ALBUMIN 4.4 06/29/2024 0836   ALBUMIN 4.4 12/27/2023 1042   AST 22 06/29/2024 0836   AST 20 01/02/2022 1331   ALT 16 06/29/2024 0836   ALT 19 01/02/2022 1331   ALKPHOS 101 06/29/2024 0836   BILITOT 0.5 06/29/2024 0836   BILITOT 0.3 12/27/2023 1042   BILITOT 0.3 01/02/2022 1331      Component Value Date/Time   TSH 10.80 (H) 06/29/2024 0836   Nutritional Lab Results  Component Value Date   VD25OH 31.42 06/29/2024   VD25OH 39.13 04/10/2024   VD25OH 34.71 10/14/2023     ASSESSMENT AND PLAN  TREATMENT PLAN FOR OBESITY:  Recommended Dietary Goals  Kelse is currently in the action stage of change. As such, her goal is to continue weight management plan. She has agreed to practicing portion control and making smarter food choices, such as increasing vegetables and decreasing simple carbohydrates.  Behavioral Intervention  We discussed the following Behavioral Modification Strategies today: increasing lean protein intake to established goals, decreasing simple carbohydrates , increasing vegetables,  increasing fiber rich foods, increasing water intake , work on meal planning and preparation, reading food labels , keeping healthy foods at home, identifying sources and decreasing liquid calories, continue to work on maintaining a reduced calorie state, getting the recommended amount of protein, incorporating whole foods, making healthy choices, staying well hydrated and practicing mindfulness when eating., and increase protein intake, fibrous foods (25 grams per day for women, 30 grams for men) and water to improve satiety and decrease hunger signals. .  Additional resources provided today: NA  Recommended Physical Activity Goals  Heron  has been advised to work up to 150 minutes of moderate intensity aerobic activity a week and strengthening exercises 2-3 times per week for cardiovascular health, weight loss maintenance and preservation of muscle mass.   She has agreed to Think about enjoyable ways to increase daily physical activity and overcoming barriers to exercise, Increase physical activity in their day and reduce sedentary time (increase NEAT)., Work on scheduling and tracking physical activity. , Continue to gradually increase the amount and intensity of exercise routine, and Combine aerobic and strengthening exercises for efficiency and improved cardiometabolic health.   ASSOCIATED CONDITIONS ADDRESSED TODAY  Action/Plan  Type 2 diabetes mellitus in patient with obesity (HCC) Will continue to monitor  Good blood sugar control is important to decrease the likelihood of diabetic complications such as nephropathy, neuropathy, limb loss, blindness, coronary artery disease, and death. Intensive lifestyle modification including diet, exercise and weight loss are the first line of treatment for diabetes.    Hypothyroidism, unspecified type Managed by PCP.  Continue Synthroid  as directed Patient is overall feeling better  Class 1 obesity due to excess calories with body mass index (BMI)  of 32.0 to 32.9 in adult, unspecified whether serious comorbidity present         Return in about 6 weeks (around 09/25/2024).SABRA She was informed of the importance of frequent follow up visits to maximize her success with intensive lifestyle modifications for her multiple health conditions.   ATTESTASTION STATEMENTS:  Reviewed by clinician on day of visit: allergies, medications, problem list, medical history, surgical history, family history, social history, and previous encounter notes.   I personally spent a total of 30 minutes in the care of the patient today including preparing to see the patient, getting/reviewing separately obtained history, counseling and educating, and documenting clinical information in the EHR.    Corean SAUNDERS. Canyon Lohr FNP-C

## 2024-08-15 ENCOUNTER — Encounter: Payer: Self-pay | Admitting: Obstetrics and Gynecology

## 2024-08-15 ENCOUNTER — Ambulatory Visit: Admitting: Obstetrics and Gynecology

## 2024-08-15 VITALS — BP 139/82 | HR 65

## 2024-08-15 DIAGNOSIS — R350 Frequency of micturition: Secondary | ICD-10-CM | POA: Diagnosis not present

## 2024-08-15 LAB — POCT URINE DIPSTICK
Bilirubin, UA: NEGATIVE
Blood, UA: NEGATIVE
Glucose, UA: NEGATIVE mg/dL
Ketones, POC UA: NEGATIVE mg/dL
Nitrite, UA: NEGATIVE
POC PROTEIN,UA: NEGATIVE
Spec Grav, UA: 1.015 (ref 1.010–1.025)
Urobilinogen, UA: 0.2 U/dL
pH, UA: 6 (ref 5.0–8.0)

## 2024-08-15 NOTE — Progress Notes (Signed)
 [facility] Return Visit  SUBJECTIVE  History of Present Illness: Catherine Coleman is a 89 y.o. female seen in follow-up for OAB. Plan at last visit was continue Myrbetriq  50mg  ER daily. Patient reports she does not feel like she is having much support with the Myrbetriq  any longer.  She is reporting that when she goes to stand up she has significant loss of urine with change in position and urgency to get to the bathroom.     Past Medical History: Patient  has a past medical history of Arthritis, Atrophic rhinitis (04/21/2016), Back pain, Breast cancer (HCC) (12/2020), Constipation, Decreased hearing, Diabetes (HCC), Diverticulitis, Dry eyes, Fatigue, Floaters in visual field, Gall bladder disease, GERD (gastroesophageal reflux disease), Glaucoma, Hiatal hernia with gastroesophageal reflux, History of blood transfusion, Hyperlipidemia, mild (04/02/2016), Hypertension, Hypothyroidism, Joint pain, Migraine aura without headache, Muscle pain, Muscle stiffness, Nasal congestion, Obesity (04/02/2016), Osteoarthritis, Parotiditis (04/21/2016), Pre-diabetes, and Vitamin D  deficiency (04/02/2016).   Past Surgical History: She  has a past surgical history that includes Total knee arthroplasty (Bilateral, 2007); Shoulder arthroscopy (Bilateral, prior to 2010); Joint replacement (Left); Cholecystectomy (2010); Appendectomy; Tonsillectomy; Total hip arthroplasty (Right, 03/22/2015); cataract surgery (2008); Tubal ligation; Posterior cervical fusion/foraminotomy (N/A, 04/27/2017); Mastoidectomy; and Breast lumpectomy with radioactive seed localization (Left, 01/21/2021).   Medications: She has a current medication list which includes the following prescription(s): anastrozole , brimonidine , calcium  carb-cholecalciferol, carvedilol , vitamin d3, estradiol , famotidine , fluticasone , latanoprost, levothyroxine , losartan , magnesium -potassium, multiple vitamins-minerals, myrbetriq , omeprazole , and rosuvastatin .    Allergies: Patient has no known allergies.   Social History: Patient  reports that she has never smoked. She has never used smokeless tobacco. She reports current alcohol use. She reports that she does not use drugs.     OBJECTIVE    Lab Results  Component Value Date   COLORU yellow 08/15/2024   CLARITYU clear 08/15/2024   GLUCOSEUR negative 08/15/2024   BILIRUBINUR negative 08/15/2024   KETONESU Negative 11/03/2022   SPECGRAV 1.015 08/15/2024   RBCUR negative 08/15/2024   PHUR 6.0 08/15/2024   PROTEINUR Negative 11/03/2022   UROBILINOGEN 0.2 08/15/2024   LEUKOCYTESUR Small (1+) (A) 08/15/2024     Physical Exam: Vitals:   08/15/24 1103  BP: 139/82  Pulse: 65   Gen: No apparent distress, A&O x 3.  Detailed Urogynecologic Evaluation:  Deferred.   ASSESSMENT AND PLAN    Ms. Catherine Coleman is a 89 y.o. with:  1. Urinary frequency    Patient to change to Gemtesa 75mg  daily. This is estimated to be 100$ a month, but patient reports she can afford that if it is necessary and effective.  If Gemtesa 75mg  is not effective then we can consider SNM or Botox. Patient thinks she would be more likely to start with Botox first and then if not effective consider SNM.  Will plan to follow up with patient about medication and plan from there.    Ioana Louks G Kathye Cipriani, NP

## 2024-08-15 NOTE — Patient Instructions (Signed)
   We will start Gemtesa 75mg  daily. I will call you in a few weeks to see where we are at on the medication. If the medication isn't working we can go to botox. We would teach you how to self cathetarize.

## 2024-08-31 ENCOUNTER — Other Ambulatory Visit: Payer: Self-pay

## 2024-08-31 MED ORDER — GEMTESA 75 MG PO TABS: 75.0000 mg | ORAL_TABLET | Freq: Every day | ORAL | 2 refills | Status: DC

## 2024-09-05 ENCOUNTER — Encounter: Payer: Self-pay | Admitting: Obstetrics and Gynecology

## 2024-09-14 ENCOUNTER — Other Ambulatory Visit: Payer: Self-pay | Admitting: Student

## 2024-09-25 ENCOUNTER — Encounter: Payer: Self-pay | Admitting: *Deleted

## 2024-09-26 ENCOUNTER — Telehealth: Payer: Self-pay

## 2024-09-26 NOTE — Telephone Encounter (Signed)
 Error CRM sent. Needing further information, which prescription?

## 2024-09-26 NOTE — Telephone Encounter (Signed)
 Copied from CRM [id number]. Topic: Clinical - Medication Question >> Sep 26, 2024 12:33 PM Catherine Coleman wrote: Reason for CRM: Patient called in requesting to have her Rx: faxed to [facility].  As she has recently changed insurance to [place].   Fax Number: #[phone]  PCP/PCP Team Please Advise

## 2024-09-28 MED ORDER — ROSUVASTATIN CALCIUM 5 MG PO TABS: 5.0000 mg | ORAL_TABLET | Freq: Every day | ORAL | 1 refills | Status: AC

## 2024-09-28 MED ORDER — OMEPRAZOLE 20 MG PO CPDR: 20.0000 mg | DELAYED_RELEASE_CAPSULE | Freq: Every day | ORAL | 1 refills | Status: AC

## 2024-09-28 MED ORDER — CARVEDILOL 3.125 MG PO TABS: 3.1250 mg | ORAL_TABLET | Freq: Two times a day (BID) | ORAL | 1 refills | Status: AC

## 2024-09-28 MED ORDER — LEVOTHYROXINE SODIUM 150 MCG PO TABS: 150.0000 ug | ORAL_TABLET | Freq: Every day | ORAL | 1 refills | Status: AC

## 2024-09-28 MED ORDER — LOSARTAN POTASSIUM 50 MG PO TABS: 50.0000 mg | ORAL_TABLET | Freq: Every day | ORAL | 1 refills | Status: AC

## 2024-09-28 NOTE — Telephone Encounter (Signed)
 Catherine Coleman   09/27/2024  8:21 PM  Thank you for submitting this issue for investigation. After a thorough review, we have determined that an error was made by an E2C2 team member. We have addressed the matter directly with the agent involved. We appreciate you bringing this to our attention.   Error/Investigation Details:    CALL ID: [id number]  After reviewing the call associated with the reported error, it does appear that the error is valid. The specialist received the call from the patient but did not inquire about which specific prescription was needed. The patient also sounded as though she may have wanted all of her prescriptions faxed to [facility].  The specialist will perform outreach to confirm exactly which prescription(s) need to be faxed to the patients insurance provider.  Thank you for your continued dedication to patient care and for your willingness to report this error for review.

## 2024-09-28 NOTE — Telephone Encounter (Signed)
 Refills sent to [facility] Mail order

## 2024-09-29 ENCOUNTER — Telehealth: Payer: Self-pay

## 2024-09-29 ENCOUNTER — Other Ambulatory Visit: Payer: Self-pay | Admitting: Hematology and Oncology

## 2024-09-29 ENCOUNTER — Other Ambulatory Visit: Payer: Self-pay

## 2024-09-29 DIAGNOSIS — R519 Headache, unspecified: Secondary | ICD-10-CM

## 2024-09-29 MED ORDER — FAMOTIDINE 40 MG PO TABS: 40.0000 mg | ORAL_TABLET | Freq: Every day | ORAL | 1 refills | Status: AC | PRN

## 2024-09-29 MED ORDER — ANASTROZOLE 1 MG PO TABS: 1.0000 mg | ORAL_TABLET | Freq: Every day | ORAL | 2 refills | Status: DC

## 2024-09-29 MED ORDER — FLUTICASONE PROPIONATE 50 MCG/ACT NA SUSP: 2.0000 | Freq: Every day | NASAL | 1 refills | Status: AC

## 2024-09-29 MED ORDER — ANASTROZOLE 1 MG PO TABS: 1.0000 mg | ORAL_TABLET | Freq: Every day | ORAL | 2 refills | Status: AC

## 2024-09-29 NOTE — Telephone Encounter (Signed)
 Copied from CRM [id number]. Topic: Clinical - Prescription Issue >> Sep 29, 2024 10:24 AM Catherine Coleman wrote: Reason for CRM: Patient returning call to provide list of medications she is requesting to be faxed to [facility]. States with her new policy she has to have medications filled through their pharmacy.  Fax: [phone] omeprazole  (PRILOSEC) 20 MG capsule levothyroxine  (SYNTHROID ) 150 MCG tablet fluticasone  (FLONASE ) 50 MCG/ACT nasal spray  famotidine  (PEPCID ) 40 MG tablet losartan  (COZAAR ) 50 MG tablet carvedilol  (COREG ) 3.125 MG tablet  Patient can be reached at [phone]

## 2024-09-29 NOTE — Telephone Encounter (Signed)
 T/C from pt requesting her prescription for Anastrozole  be sent to [facility].  Pharmacy is [facility].  RX sent per pt's request

## 2024-10-02 ENCOUNTER — Ambulatory Visit: Admitting: Nurse Practitioner

## 2024-10-10 ENCOUNTER — Ambulatory Visit: Admitting: Nurse Practitioner

## 2024-10-10 ENCOUNTER — Encounter: Payer: Self-pay | Admitting: Nurse Practitioner

## 2024-10-12 ENCOUNTER — Other Ambulatory Visit: Payer: Self-pay

## 2024-10-12 ENCOUNTER — Telehealth: Payer: Self-pay

## 2024-10-12 MED ORDER — GEMTESA 75 MG PO TABS: 75.0000 mg | ORAL_TABLET | Freq: Every day | ORAL | 2 refills | Status: AC

## 2024-10-12 NOTE — Telephone Encounter (Signed)
 Patient calling for refills and requesting a follow up with K. Zuleta

## 2024-10-17 ENCOUNTER — Ambulatory Visit: Admitting: Nurse Practitioner

## 2024-10-18 ENCOUNTER — Encounter: Payer: Self-pay | Admitting: Obstetrics and Gynecology

## 2024-10-18 ENCOUNTER — Ambulatory Visit: Admitting: Obstetrics and Gynecology

## 2024-10-18 VITALS — BP 135/83 | HR 64

## 2024-10-18 DIAGNOSIS — N3281 Overactive bladder: Secondary | ICD-10-CM | POA: Diagnosis not present

## 2024-10-18 NOTE — Progress Notes (Signed)
 [facility] Return Visit  SUBJECTIVE  History of Present Illness: Catherine Coleman is a 89 y.o. female seen in follow-up for OAB. She did see improvement with the Gemtesa  but her new insurance will not send it until she pays $800. So she went back to the myrbetriq  50mg . She is still leaking on the way to the bathroom a few times a day. This was better on the gemtesa .   Denies leakage with cough/ sneeze.  Past Medical History: Patient  has a past medical history of Arthritis, Atrophic rhinitis (04/21/2016), Back pain, Breast cancer (HCC) (12/2020), Constipation, Decreased hearing, Diabetes (HCC), Diverticulitis, Dry eyes, Fatigue, Floaters in visual field, Gall bladder disease, GERD (gastroesophageal reflux disease), Glaucoma, Hiatal hernia with gastroesophageal reflux, History of blood transfusion, Hyperlipidemia, mild (04/02/2016), Hypertension, Hypothyroidism, Joint pain, Migraine aura without headache, Muscle pain, Muscle stiffness, Nasal congestion, Obesity (04/02/2016), Osteoarthritis, Parotiditis (04/21/2016), Pre-diabetes, and Vitamin D  deficiency (04/02/2016).   Past Surgical History: She  has a past surgical history that includes Total knee arthroplasty (Bilateral, 2007); Shoulder arthroscopy (Bilateral, prior to 2010); Joint replacement (Left); Cholecystectomy (2010); Appendectomy; Tonsillectomy; Total hip arthroplasty (Right, 03/22/2015); cataract surgery (2008); Tubal ligation; Posterior cervical fusion/foraminotomy (N/A, 04/27/2017); Mastoidectomy; and Breast lumpectomy with radioactive seed localization (Left, 01/21/2021).   Medications: She has a current medication list which includes the following prescription(s): anastrozole , brimonidine , calcium  carb-cholecalciferol, carvedilol , vitamin d3, estradiol , famotidine , fluticasone , latanoprost, levothyroxine , losartan , magnesium -potassium, multiple vitamins-minerals, omeprazole , rosuvastatin , and gemtesa .   Allergies: Patient  has no known allergies.   Social History: Patient  reports that she has never smoked. She has never used smokeless tobacco. She reports current alcohol use. She reports that she does not use drugs.     OBJECTIVE    Lab Results  Component Value Date   COLORU yellow 08/15/2024   CLARITYU clear 08/15/2024   GLUCOSEUR negative 08/15/2024   BILIRUBINUR negative 08/15/2024   KETONESU Negative 11/03/2022   SPECGRAV 1.015 08/15/2024   RBCUR negative 08/15/2024   PHUR 6.0 08/15/2024   PROTEINUR Negative 11/03/2022   UROBILINOGEN 0.2 08/15/2024   LEUKOCYTESUR Small (1+) (A) 08/15/2024     Physical Exam: Vitals:   10/18/24 1044  BP: 135/83  Pulse: 64    Gen: No apparent distress, A&O x 3.  Detailed Urogynecologic Evaluation:  Deferred.   ASSESSMENT AND PLAN    Ms. Hults is a 89 y.o. with:  1. Overactive bladder     - Reviewed options for refractory OAB including PTNS, INTS, SNM and botox. She is very interested in the implantable tibial nerve option. We discussed improvement of at least 50% with the device and it stimulates the nerve 3 times a week. The battery life is 10-15 years. The procedure is performed under local anesthesia. [employer] brochure provided.  - We discussed we are still waiting approval for the device at the hospital and the surgery center. Will keep her on our list and inform her when we are able to perform the procedure.  - She will stay on the Myrbetriq  50mg  for now.   Rosaline LOISE Caper, MD  Time spent: I spent 17 minutes dedicated to the care of this patient on the date of this encounter to include pre-visit review of records, face-to-face time with the patient and post visit documentation.

## 2024-10-30 ENCOUNTER — Ambulatory Visit: Admitting: Nurse Practitioner

## 2024-11-01 ENCOUNTER — Ambulatory Visit: Admitting: Nurse Practitioner

## 2024-11-13 ENCOUNTER — Ambulatory Visit: Admitting: Family Medicine

## 2025-01-18 ENCOUNTER — Inpatient Hospital Stay: Admitting: Hematology and Oncology

## 2025-06-26 ENCOUNTER — Ambulatory Visit
